# Patient Record
Sex: Male | Born: 1965
Health system: Southern US, Community
[De-identification: ages and names within clinical notes are randomized; demographics above are authoritative.]

## PROBLEM LIST (undated history)

## (undated) DIAGNOSIS — K219 Gastro-esophageal reflux disease without esophagitis: Secondary | ICD-10-CM

## (undated) DIAGNOSIS — I10 Essential (primary) hypertension: Secondary | ICD-10-CM

## (undated) DIAGNOSIS — I7121 Aneurysm of the ascending aorta, without rupture: Secondary | ICD-10-CM

## (undated) DIAGNOSIS — K029 Dental caries, unspecified: Secondary | ICD-10-CM

## (undated) DIAGNOSIS — R011 Cardiac murmur, unspecified: Secondary | ICD-10-CM

## (undated) DIAGNOSIS — E119 Type 2 diabetes mellitus without complications: Secondary | ICD-10-CM

## (undated) DIAGNOSIS — E785 Hyperlipidemia, unspecified: Secondary | ICD-10-CM

## (undated) DIAGNOSIS — R0600 Dyspnea, unspecified: Secondary | ICD-10-CM

## (undated) DIAGNOSIS — I712 Thoracic aortic aneurysm, without rupture: Secondary | ICD-10-CM

## (undated) DIAGNOSIS — I330 Acute and subacute infective endocarditis: Secondary | ICD-10-CM

## (undated) DIAGNOSIS — F1020 Alcohol dependence, uncomplicated: Secondary | ICD-10-CM

## (undated) DIAGNOSIS — I350 Nonrheumatic aortic (valve) stenosis: Secondary | ICD-10-CM

## (undated) HISTORY — DX: Dental caries, unspecified: K02.9

## (undated) HISTORY — DX: Acute and subacute infective endocarditis: I33.0

## (undated) HISTORY — DX: Essential (primary) hypertension: I10

## (undated) HISTORY — DX: Type 2 diabetes mellitus without complications: E11.9

## (undated) HISTORY — PX: CHOLECYSTECTOMY: SHX55

## (undated) HISTORY — DX: Cardiac murmur, unspecified: R01.1

## (undated) HISTORY — DX: Hyperlipidemia, unspecified: E78.5

## (undated) HISTORY — DX: Gastro-esophageal reflux disease without esophagitis: K21.9

## (undated) HISTORY — DX: Alcohol dependence, uncomplicated: F10.20

---

## 2016-08-02 DIAGNOSIS — E119 Type 2 diabetes mellitus without complications: Secondary | ICD-10-CM | POA: Diagnosis not present

## 2016-08-02 DIAGNOSIS — I10 Essential (primary) hypertension: Secondary | ICD-10-CM | POA: Diagnosis not present

## 2016-08-02 DIAGNOSIS — E782 Mixed hyperlipidemia: Secondary | ICD-10-CM | POA: Diagnosis not present

## 2016-12-06 DIAGNOSIS — E119 Type 2 diabetes mellitus without complications: Secondary | ICD-10-CM | POA: Diagnosis not present

## 2016-12-06 DIAGNOSIS — I10 Essential (primary) hypertension: Secondary | ICD-10-CM | POA: Diagnosis not present

## 2016-12-06 DIAGNOSIS — E782 Mixed hyperlipidemia: Secondary | ICD-10-CM | POA: Diagnosis not present

## 2017-06-13 DIAGNOSIS — E782 Mixed hyperlipidemia: Secondary | ICD-10-CM | POA: Diagnosis not present

## 2017-06-13 DIAGNOSIS — I10 Essential (primary) hypertension: Secondary | ICD-10-CM | POA: Diagnosis not present

## 2017-06-13 DIAGNOSIS — E119 Type 2 diabetes mellitus without complications: Secondary | ICD-10-CM | POA: Diagnosis not present

## 2017-12-19 DIAGNOSIS — E1169 Type 2 diabetes mellitus with other specified complication: Secondary | ICD-10-CM | POA: Diagnosis not present

## 2017-12-19 DIAGNOSIS — E782 Mixed hyperlipidemia: Secondary | ICD-10-CM | POA: Diagnosis not present

## 2017-12-19 DIAGNOSIS — I10 Essential (primary) hypertension: Secondary | ICD-10-CM | POA: Diagnosis not present

## 2018-05-29 DIAGNOSIS — I10 Essential (primary) hypertension: Secondary | ICD-10-CM | POA: Diagnosis not present

## 2018-05-29 DIAGNOSIS — I2510 Atherosclerotic heart disease of native coronary artery without angina pectoris: Secondary | ICD-10-CM | POA: Diagnosis not present

## 2018-05-29 DIAGNOSIS — E782 Mixed hyperlipidemia: Secondary | ICD-10-CM | POA: Diagnosis not present

## 2018-06-24 DIAGNOSIS — F1020 Alcohol dependence, uncomplicated: Secondary | ICD-10-CM

## 2018-06-24 DIAGNOSIS — K219 Gastro-esophageal reflux disease without esophagitis: Secondary | ICD-10-CM

## 2018-06-24 DIAGNOSIS — I2510 Atherosclerotic heart disease of native coronary artery without angina pectoris: Secondary | ICD-10-CM

## 2018-06-24 DIAGNOSIS — E119 Type 2 diabetes mellitus without complications: Secondary | ICD-10-CM

## 2018-06-24 DIAGNOSIS — I10 Essential (primary) hypertension: Secondary | ICD-10-CM

## 2018-06-24 DIAGNOSIS — R011 Cardiac murmur, unspecified: Secondary | ICD-10-CM

## 2018-06-24 DIAGNOSIS — E785 Hyperlipidemia, unspecified: Secondary | ICD-10-CM

## 2018-06-24 HISTORY — DX: Alcohol dependence, uncomplicated: F10.20

## 2018-06-24 HISTORY — DX: Hyperlipidemia, unspecified: E78.5

## 2018-06-24 HISTORY — DX: Gastro-esophageal reflux disease without esophagitis: K21.9

## 2018-06-24 HISTORY — DX: Cardiac murmur, unspecified: R01.1

## 2018-06-24 HISTORY — DX: Essential (primary) hypertension: I10

## 2018-06-24 HISTORY — DX: Type 2 diabetes mellitus without complications: E11.9

## 2018-06-24 HISTORY — DX: Atherosclerotic heart disease of native coronary artery without angina pectoris: I25.10

## 2018-06-25 ENCOUNTER — Encounter: Payer: Self-pay | Admitting: Cardiology

## 2018-06-25 ENCOUNTER — Ambulatory Visit: Payer: Commercial Managed Care - PPO | Admitting: Cardiology

## 2018-06-25 VITALS — BP 114/66 | HR 66 | Ht 68.5 in | Wt 218.8 lb

## 2018-06-25 DIAGNOSIS — Q2381 Bicuspid aortic valve: Secondary | ICD-10-CM

## 2018-06-25 DIAGNOSIS — E119 Type 2 diabetes mellitus without complications: Secondary | ICD-10-CM | POA: Diagnosis not present

## 2018-06-25 DIAGNOSIS — I10 Essential (primary) hypertension: Secondary | ICD-10-CM

## 2018-06-25 DIAGNOSIS — Q231 Congenital insufficiency of aortic valve: Secondary | ICD-10-CM

## 2018-06-25 DIAGNOSIS — F1020 Alcohol dependence, uncomplicated: Secondary | ICD-10-CM

## 2018-06-25 DIAGNOSIS — I350 Nonrheumatic aortic (valve) stenosis: Secondary | ICD-10-CM

## 2018-06-25 HISTORY — DX: Nonrheumatic aortic (valve) stenosis: I35.0

## 2018-06-25 HISTORY — DX: Congenital insufficiency of aortic valve: Q23.1

## 2018-06-25 HISTORY — DX: Bicuspid aortic valve: Q23.81

## 2018-06-25 NOTE — Patient Instructions (Addendum)
Medication Instructions:  Your physician recommends that you continue on your current medications as directed. Please refer to the Current Medication list given to you today.  If you need a refill on your cardiac medications before your next appointment, please call your pharmacy.   Lab work: NONE If you have labs (blood work) drawn today and your tests are completely normal, you will receive your results only by: Marland Kitchen MyChart Message (if you have MyChart) OR . A paper copy in the mail If you have any lab test that is abnormal or we need to change your treatment, we will call you to review the results.  Testing/Procedures: You had an EKG Performed today  Your physician has requested that you have an echocardiogram. Echocardiography is a painless test that uses sound waves to create images of your heart. It provides your doctor with information about the size and shape of your heart and how well your heart's chambers and valves are working. This procedure takes approximately one hour. There are no restrictions for this procedure.  Your physician has recommended that you wear an event monitor. Event monitors are medical devices that record the heart's electrical activity. Doctors most often [place] these monitors to diagnose arrhythmias. Arrhythmias are problems with the speed or rhythm of the heartbeat. The monitor is a small, portable device. You can wear one while you do your normal daily activities. This is usually used to diagnose what is causing palpitations/syncope (passing out).You will wear this device for 30 days.   Follow-Up: At [facility], you and your health needs are our priority.  As part of our continuing mission to provide you with exceptional heart care, we have created designated Provider Care Teams.  These Care Teams include your primary Cardiologist (physician) and Advanced Practice Providers (APPs -  Physician Assistants and Nurse Practitioners) who all work together to provide  you with the care you need, when you need it. You will need to follow up with Dr. Kirtland Bouchard in 1 month in the [place] office.  Any Other Special Instructions Will Be Listed Below    Echocardiogram An echocardiogram is a procedure that uses painless sound waves (ultrasound) to produce an image of the heart. Images from an echocardiogram can provide important information about:  Signs of coronary artery disease (CAD).  Aneurysm detection. An aneurysm is a weak or damaged part of an artery wall that bulges out from the normal force of blood pumping through the body.  Heart size and shape. Changes in the size or shape of the heart can be associated with certain conditions, including heart failure, aneurysm, and CAD.  Heart muscle function.  Heart valve function.  Signs of a past heart attack.  Fluid buildup around the heart.  Thickening of the heart muscle.  A tumor or infectious growth around the heart valves. Tell a health care provider about:  Any allergies you have.  All medicines you are taking, including vitamins, herbs, eye drops, creams, and over-the-counter medicines.  Any blood disorders you have.  Any surgeries you have had.  Any medical conditions you have.  Whether you are pregnant or may be pregnant. What are the risks? Generally, this is a safe procedure. However, problems may occur, including:  Allergic reaction to dye (contrast) that may be used during the procedure. What happens before the procedure? No specific preparation is needed. You may eat and drink normally. What happens during the procedure?   An IV tube may be inserted into one of your veins.  You may receive contrast through this tube. A contrast is an injection that improves the quality of the pictures from your heart.  A gel will be applied to your chest.  A wand-like tool (transducer) will be moved over your chest. The gel will help to transmit the sound waves from the transducer.  The  sound waves will harmlessly bounce off of your heart to allow the heart images to be captured in real-time motion. The images will be recorded on a computer. The procedure may vary among health care providers and hospitals. What happens after the procedure?  You may return to your normal, everyday life, including diet, activities, and medicines, unless your health care provider tells you not to do that. Summary  An echocardiogram is a procedure that uses painless sound waves (ultrasound) to produce an image of the heart.  Images from an echocardiogram can provide important information about the size and shape of your heart, heart muscle function, heart valve function, and fluid buildup around your heart.  You do not need to do anything to prepare before this procedure. You may eat and drink normally.  After the echocardiogram is completed, you may return to your normal, everyday life, unless your health care provider tells you not to do that. This information is not intended to replace advice given to you by your health care provider. Make sure you discuss any questions you have with your health care provider. Document Released: 04/05/2000 Document Revised: 05/11/2016 Document Reviewed: 05/11/2016 Elsevier Interactive Patient Education  2019 [employer] Inc.    Ambulatory Cardiac Monitoring An ambulatory cardiac monitor is a small recording device that is used to detect abnormal heart rhythms (arrhythmias). Most monitors are connected by wires to flat, sticky disks (electrodes) that are then attached to your chest. You may need to wear a monitor if you have had symptoms such as:  Fast heartbeats (palpitations).  Dizziness.  Fainting or light-headedness.  Unexplained weakness.  Shortness of breath. There are several types of monitors. Some common monitors include:  Holter monitor. This records your heart rhythm continuously, usually for 24-48 hours.  Event (episodic) monitor. This  monitor has a symptoms button, and when pushed, it will begin recording. You need to activate this monitor to record when you have a heart-related symptom.  Automatic detection monitor. This monitor will begin recording when it detects an abnormal heartbeat. What are the risks? Generally, these devices are safe to use. However, it is possible that the skin under the electrodes will become irritated. How to prepare for monitoring Your health care provider will prepare your chest for the electrode placement and show you how to use the monitor.  Do not apply lotions to your chest before monitoring.  Follow directions on how to care for the monitor, and how to return the monitor when the testing period is complete. How to use your cardiac monitor  Follow directions about how long to wear the monitor, and if you can take the monitor off in order to shower or bathe. ? Do not let the monitor get wet. ? Do not bathe, swim, or use a hot tub while wearing the monitor.  Keep your skin clean. Do not put body lotion or moisturizer on your chest.  Change the electrodes as told by your health care provider, or any time they stop sticking to your skin. You may need to use medical tape to keep them on.  Try to put the electrodes in slightly different places on your chest to  help prevent skin irritation. Follow directions from your health care provider about where to place the electrodes.  Make sure the monitor is safely clipped to your clothing or in a location close to your body as recommended by your health care provider.  If your monitor has a symptoms button, press the button to mark an event as soon as you feel a heart-related symptom, such as: ? Dizziness. ? Weakness. ? Light-headedness. ? Palpitations. ? Thumping or pounding in your chest. ? Shortness of breath. ? Unexplained weakness.  Keep a diary of your activities, such as walking, doing chores, and taking medicine. It is very important  to note what you were doing when you pushed the button to record your symptoms. This will help your health care provider determine what might be contributing to your symptoms.  Send the recorded information as recommended by your health care provider. It may take some time for your health care provider to process the results.  Change the batteries as told by your health care provider.  Keep electronic devices away from your monitor. These include: ? Tablets. ? MP3 players. ? Cell phones.  While wearing your monitor you should avoid: ? Electric blankets. ? Firefighterlectric razors. ? Electric toothbrushes. ? Microwave ovens. ? Magnets. ? Metal detectors. Get help right away if:  You have chest pain.  You have shortness of breath or extreme difficulty breathing.  You develop a very fast heartbeat that does not get better.  You develop dizziness that does not go away.  You faint or constantly feel like you are about to faint. Summary  An ambulatory cardiac monitor is a small recording device that is used to detect abnormal heart rhythms (arrhythmias).  Make sure you understand how to send the information from the monitor to your health care provider.  It is important to press the button on the monitor when you have any heart-related symptoms.  Keep a diary of your activities, such as walking, doing chores, and taking medicine. It is very important to note what you were doing when you pushed the button to record your symptoms. This will help your health care provider learn what might be causing your symptoms. This information is not intended to replace advice given to you by your health care provider. Make sure you discuss any questions you have with your health care provider. Document Released: 01/16/2008 Document Revised: 01/22/2017 Document Reviewed: 03/23/2016 Elsevier Interactive Patient Education  2019 [employer]

## 2018-06-25 NOTE — Progress Notes (Addendum)
Cardiology Consultation:    Date:  06/25/2018   ID:  Ronnie Ruiz, DOB 1966-03-10, MRN [id number]  PCP:  Ronnie Miyamoto, MD  Cardiologist:  Ronnie Balsam, MD   Referring MD: Ronnie Ruiz,*   Chief Complaint  Patient presents with  . Atherosclerosis  I get the problem with my heart valve  History of Present Illness:    Ronnie Ruiz is a 53 y.o. male who is being seen today for the evaluation of aortic stenosis at the request of Ronnie Ruiz,*.  Apparently have seen him many years ago he was diagnosed with bicuspid aortic valve from the initial fall on the regular basis that disappeared from follow-up he went back to his primary care physician for regular follow-up and then he was referred back to [place].  He described few issues #1 he does have some exertional shortness of breath he blames it on overall the fact that he works a lot and his body wears out.  Another issue he complained of having some chest pain which is typically not related to exercise located on the left side of his chest lasting for a few minutes pressure-like sensation.  No dizziness no passing out.  He is aware of the fact that he had valve problem and he is doing well she told me about the doing of today's visit.  I have no old notes on him.  He works as a Publishing rights manager.  That required some walking when he does quite well.  That does not require physical work anymore.  No passing out. He does have high blood pressure.  At some appears to be well managed also diabetes.  He does not smoke never did but dip.  He drinks alcohol on the regular basis every day single day drinks about 6 beers.  Past Medical History:  Diagnosis Date  . Alcoholism (HCC) 06/24/2018  . Diabetes mellitus type 2, controlled (HCC) 06/24/2018  . Essential hypertension 06/24/2018  . GERD (gastroesophageal reflux disease) 06/24/2018  . Heart murmur 06/24/2018  . Hyperlipidemia 06/24/2018      Current  Medications: Current Meds  Medication Sig  . aspirin EC 81 MG tablet Take 81 mg by mouth daily.  Marland Kitchen esomeprazole (NEXIUM) 20 MG packet Take 20 mg by mouth daily before breakfast.  . ibuprofen (ADVIL,MOTRIN) 800 MG tablet Take 800 mg by mouth every 8 (eight) hours as needed.  Marland Kitchen lisinopril (PRINIVIL,ZESTRIL) 10 MG tablet Take 10 mg by mouth daily.  . metFORMIN (GLUCOPHAGE) 500 MG tablet Take 500 mg by mouth 2 (two) times daily with a meal.  . metoprolol succinate (TOPROL-XL) 50 MG 24 hr tablet Take 50 mg by mouth daily. Take with or immediately following a meal.  . nitroGLYCERIN (NITROSTAT) 0.4 MG SL tablet Place 0.4 mg under the tongue every 5 (five) minutes as needed for chest pain.  . Omega-3 Fatty Acids (FISH OIL) 1000 MG CAPS Take 1 capsule by mouth 2 (two) times daily.  . simvastatin (ZOCOR) 40 MG tablet Take 40 mg by mouth daily.     Allergies:   Patient has no known allergies.   Social History   Socioeconomic History  . Marital status: Unknown    Spouse name: Not on file  . Number of children: Not on file  . Years of education: Not on file  . Highest education level: Not on file  Occupational History  . Not on file  Social Needs  . Financial resource strain: Not on file  .  Food insecurity:    Worry: Not on file    Inability: Not on file  . Transportation needs:    Medical: Not on file    Non-medical: Not on file  Tobacco Use  . Smoking status: Never Smoker  . Smokeless tobacco: Current User    Types: Snuff  Substance and Sexual Activity  . Alcohol use: Yes  . Drug use: Never  . Sexual activity: Not on file  Lifestyle  . Physical activity:    Days per week: Not on file    Minutes per session: Not on file  . Stress: Not on file  Relationships  . Social connections:    Talks on phone: Not on file    Gets together: Not on file    Attends religious service: Not on file    Active member of club or organization: Not on file    Attends meetings of clubs or  organizations: Not on file    Relationship status: Not on file  Other Topics Concern  . Not on file  Social History Narrative  . Not on file     Family History: The patient's family history includes Alcoholism in his father; Anxiety disorder in his father; CAD in his father; Hyperlipidemia in his father; Lung cancer in his father and mother; Pulmonary disease in his father and mother; Renal cancer in his father. ROS:   Please see the history of present illness.    All 14 point review of systems negative except as described per history of present illness.  EKGs/Labs/Other Studies Reviewed:    The following studies were reviewed today:     Recent Labs: No results found for requested labs within last 8760 hours.  Recent Lipid Panel No results found for: CHOL, TRIG, HDL, CHOLHDL, VLDL, LDLCALC, LDLDIRECT  Physical Exam:    VS:  BP 114/66   Pulse 66   Ht 5' 8.5" (1.74 m)   Wt 218 lb 12.8 oz (99.2 kg)   SpO2 96%   BMI 32.78 kg/m     Wt Readings from Last 3 Encounters:  06/25/18 218 lb 12.8 oz (99.2 kg)     GEN:  Well nourished, well developed in no acute distress HEENT: Normal NECK: No JVD; No carotid bruits LYMPHATICS: No lymphadenopathy CARDIAC: RRR, late peaking ejection murmur grade 3/6 best heard at the rt upper portion of the sternum, no rubs, no gallops RESPIRATORY:  Clear to auscultation without rales, wheezing or rhonchi  ABDOMEN: Soft, non-tender, non-distended MUSCULOSKELETAL:  No edema; No deformity  SKIN: Warm and dry NEUROLOGIC:  Alert and oriented x 3 PSYCHIATRIC:  Normal affect   ASSESSMENT:    1. Essential hypertension   2. Nonrheumatic aortic valve stenosis   3. Bicuspid aortic valve   4. Controlled type 2 diabetes mellitus without complication, without long-term current use of insulin (HCC)   5. Alcoholism (HCC)    PLAN:    In order of problems listed above:  1. Nonrheumatic aortic valve stenosis on the physical examination get some  indicators for severe aortic stenosis.  I will ask him to have echocardiogram quickly to establish if this is truly severe aortic stenosis.  It looks like etiology of this problem is bicuspid aortic valve which is congenital.  I told him until we find out how significant the valve is not to push himself heart.  On top of that he does have some symptoms that are concerning it is not exactly clear from his description if this is related  to his aortic stenosis but the way to clarify this will be to do echocardiogram which I will make arrangements to do. 2. Bicuspid aortic valve: Plan as outlined above. 3. Essential hypertension blood pressure appears to be well controlled we will continue present management. 4. Diabetes mellitus he said he recently started to lose set up with his diet and he is actually hemoglobin A1c went higher he remember numbers 7.7 and he is trying to go back on his regular diet to improve this. 5. EtOH abuse obviously a problem.  I told him that he did cut down his alcohol he said he will work on that. 6. Palpitations he described to have fluttering that happen about maybe once or twice a month lasting for a few minutes make him dizzy but not to the point of passing out.  I will ask him to have monitor.   Medication Adjustments/Labs and Tests Ordered: Current medicines are reviewed at length with the patient today.  Concerns regarding medicines are outlined above.  No orders of the defined types were placed in this encounter.  No orders of the defined types were placed in this encounter.   Signed, Georgeanna Lea, MD, [facility]. 06/25/2018 3:25 PM    [facility]

## 2018-06-26 ENCOUNTER — Other Ambulatory Visit: Payer: Self-pay | Admitting: Cardiology

## 2018-06-26 DIAGNOSIS — I10 Essential (primary) hypertension: Secondary | ICD-10-CM

## 2018-06-26 DIAGNOSIS — R002 Palpitations: Secondary | ICD-10-CM

## 2018-06-26 DIAGNOSIS — Q231 Congenital insufficiency of aortic valve: Secondary | ICD-10-CM

## 2018-06-26 DIAGNOSIS — I350 Nonrheumatic aortic (valve) stenosis: Secondary | ICD-10-CM

## 2018-07-01 ENCOUNTER — Ambulatory Visit (INDEPENDENT_AMBULATORY_CARE_PROVIDER_SITE_OTHER): Payer: Commercial Managed Care - PPO

## 2018-07-01 ENCOUNTER — Other Ambulatory Visit: Payer: Self-pay

## 2018-07-01 ENCOUNTER — Telehealth: Payer: Self-pay | Admitting: Emergency Medicine

## 2018-07-01 DIAGNOSIS — I10 Essential (primary) hypertension: Secondary | ICD-10-CM

## 2018-07-01 DIAGNOSIS — Q231 Congenital insufficiency of aortic valve: Secondary | ICD-10-CM

## 2018-07-01 DIAGNOSIS — I350 Nonrheumatic aortic (valve) stenosis: Secondary | ICD-10-CM

## 2018-07-01 NOTE — Progress Notes (Signed)
Complete echocardiogram has been performed.  Jimmy Helia Haese RDCS, RVT 

## 2018-07-01 NOTE — Telephone Encounter (Signed)
Informed patient of echocardiogram results and informed him that Dr. Bing Matter was concerned and wanted to see him tomorrow. I informed the patient that we are in the [place] office this week he states he will call back because he couldn't schedule an appointment at this time. I asked him to call back today. He verbally understood.

## 2018-07-02 ENCOUNTER — Ambulatory Visit (HOSPITAL_BASED_OUTPATIENT_CLINIC_OR_DEPARTMENT_OTHER)
Admission: RE | Admit: 2018-07-02 | Discharge: 2018-07-02 | Disposition: A | Discharge status: Home / Self Care | Payer: Commercial Managed Care - PPO | Source: Ambulatory Visit | Attending: Cardiology | Admitting: Cardiology

## 2018-07-02 ENCOUNTER — Encounter: Payer: Self-pay | Admitting: Cardiology

## 2018-07-02 ENCOUNTER — Ambulatory Visit (INDEPENDENT_AMBULATORY_CARE_PROVIDER_SITE_OTHER): Payer: Commercial Managed Care - PPO | Admitting: Cardiology

## 2018-07-02 ENCOUNTER — Other Ambulatory Visit (HOSPITAL_BASED_OUTPATIENT_CLINIC_OR_DEPARTMENT_OTHER): Payer: Commercial Managed Care - PPO

## 2018-07-02 VITALS — BP 134/70 | HR 72 | Ht 68.5 in | Wt 217.1 lb

## 2018-07-02 DIAGNOSIS — Z01818 Encounter for other preprocedural examination: Secondary | ICD-10-CM | POA: Diagnosis not present

## 2018-07-02 DIAGNOSIS — E119 Type 2 diabetes mellitus without complications: Secondary | ICD-10-CM | POA: Diagnosis not present

## 2018-07-02 DIAGNOSIS — Q231 Congenital insufficiency of aortic valve: Secondary | ICD-10-CM

## 2018-07-02 DIAGNOSIS — I10 Essential (primary) hypertension: Secondary | ICD-10-CM

## 2018-07-02 DIAGNOSIS — I350 Nonrheumatic aortic (valve) stenosis: Secondary | ICD-10-CM

## 2018-07-02 DIAGNOSIS — E782 Mixed hyperlipidemia: Secondary | ICD-10-CM

## 2018-07-02 DIAGNOSIS — I712 Thoracic aortic aneurysm, without rupture: Secondary | ICD-10-CM

## 2018-07-02 DIAGNOSIS — I7121 Aneurysm of the ascending aorta, without rupture: Secondary | ICD-10-CM

## 2018-07-02 DIAGNOSIS — I719 Aortic aneurysm of unspecified site, without rupture: Secondary | ICD-10-CM | POA: Diagnosis not present

## 2018-07-02 HISTORY — DX: Thoracic aortic aneurysm, without rupture: I71.2

## 2018-07-02 HISTORY — DX: Aneurysm of the ascending aorta, without rupture: I71.21

## 2018-07-02 MED ORDER — LISINOPRIL 10 MG PO TABS: 5.0000 mg | ORAL_TABLET | Freq: Every day | ORAL | 0 refills | Status: DC

## 2018-07-02 NOTE — H&P (View-Only) (Signed)
Cardiology Office Note:    Date:  07/02/2018   ID:  Ronnie Ruiz, DOB 07-09-1965, MRN [id number]  PCP:  Ronnie Miyamoto, MD  Cardiologist:  Ronnie Balsam, MD    Referring MD: Ronnie Ruiz,*   Chief Complaint  Patient presents with   Follow-up  I am here to talk about my test  History of Present Illness:    Ronnie Ruiz is a 53 y.o. male whom I followed a few years ago because of bicuspid aortic valve and few years ago he is aortic valve was assessed as moderate then he disappeared from follow-up for few years and then about 3 weeks ago showed up in my office he did have characteristic of significant aortic stenosis based on auscultation and echocardiogram was done echocardiogram confirmed presence of critical aortic stenosis with mean grad of 64 area well below 1 cm.  He is downgrading his symptoms however his wife was in the room with him today tells me that he get tightness in the chest when he climbs stairs also shortness of breath to the point that he has to stop there is no dizziness no passing out.  I brought him today to the office to talk about options in this situation.  There is no doubt in my mind that this valve is critical and aggressive management is needed.  I talked to him about the prognosis of critical aortic stenosis which is very poor without intervention I talked also about options for fixing the valve including open heart surgery with either biological a mechanical valve because of his history of drinking probably biological valve will be most visible because of risk of anticoagulation.  We also talked briefly about TAVR however also mention to him that this procedure is not indicated for somebody with bicuspid aortic valve.  I also told him that the most important test that need to be done in order to determine what procedure will be the best for him is cardiac catheterization we discussed cardiac catheterization including all risk benefits as well as  alternatives, he agreed to proceed that way.  I also told him that he will require CT of his chest to look into the anterior aorta what I can see with my echocardiogram is enlargement of the proximal portion of the ascending aorta to 41 mm.  This is probably a part of aortopathy that is present in patient with bicuspid aortic valve.  I also informed him about the fact that he will required carotid ultrasounds to make sure he does not have any significant cardiac arterial stenosis.  I can see bruit on the left side I suspect this is transmitted murmur from aortic stenosis however we need to make sure there is no significant stenosis based on this test will decide which will be the best way to approach his problems.  Both him and his wife asked very intelligent questions and all were answered.  Past Medical History:  Diagnosis Date   Alcoholism (HCC) 06/24/2018   Diabetes mellitus type 2, controlled (HCC) 06/24/2018   Essential hypertension 06/24/2018   GERD (gastroesophageal reflux disease) 06/24/2018   Heart murmur 06/24/2018   Hyperlipidemia 06/24/2018      Current Medications: Current Meds  Medication Sig   aspirin EC 81 MG tablet Take 81 mg by mouth daily.   esomeprazole (NEXIUM) 20 MG packet Take 20 mg by mouth daily before breakfast.   ibuprofen (ADVIL,MOTRIN) 800 MG tablet Take 800 mg by mouth every 8 (eight) hours as needed.  lisinopril (PRINIVIL,ZESTRIL) 10 MG tablet Take 10 mg by mouth daily.  °• metFORMIN (GLUCOPHAGE) 500 MG tablet Take 500 mg by mouth 2 (two) times daily with a meal.  °• metoprolol succinate (TOPROL-XL) 50 MG 24 hr tablet Take 50 mg by mouth daily. Take with or immediately following a meal.  °• nitroGLYCERIN (NITROSTAT) 0.4 MG SL tablet Place 0.4 mg under the tongue every 5 (five) minutes as needed for chest pain.  °• Omega-3 Fatty Acids (FISH OIL) 1000 MG CAPS Take 1 capsule by mouth 2 (two) times daily.  °• simvastatin (ZOCOR) 40 MG tablet Take 40 mg by mouth daily.    °  ° °Allergies:   Patient has no known allergies.  ° °Social History  ° °Socioeconomic History  °• Marital status: Unknown  °  Spouse name: Not on file  °• Number of children: Not on file  °• Years of education: Not on file  °• Highest education level: Not on file  °Occupational History  °• Not on file  °Social Needs  °• Financial resource strain: Not on file  °• Food insecurity:  °  Worry: Not on file  °  Inability: Not on file  °• Transportation needs:  °  Medical: Not on file  °  Non-medical: Not on file  °Tobacco Use  °• Smoking status: Never Smoker  °• Smokeless tobacco: Current User  °  Types: Snuff  °Substance and Sexual Activity  °• Alcohol use: Yes  °• Drug use: Never  °• Sexual activity: Not on file  °Lifestyle  °• Physical activity:  °  Days per week: Not on file  °  Minutes per session: Not on file  °• Stress: Not on file  °Relationships  °• Social connections:  °  Talks on phone: Not on file  °  Gets together: Not on file  °  Attends religious service: Not on file  °  Active member of club or organization: Not on file  °  Attends meetings of clubs or organizations: Not on file  °  Relationship status: Not on file  °Other Topics Concern  °• Not on file  °Social History Narrative  °• Not on file  °  ° °Family History: °The patient's family history includes Alcoholism in his father; Anxiety disorder in his father; CAD in his father; Hyperlipidemia in his father; Lung cancer in his father and mother; Pulmonary disease in his father and mother; Renal cancer in his father. °ROS:   °Please see the history of present illness.    °All 14 point review of systems negative except as described per history of present illness ° °EKGs/Labs/Other Studies Reviewed:   ° ° ° °Recent Labs: °No results found for requested labs within last 8760 hours.  °Recent Lipid Panel °No results found for: CHOL, TRIG, HDL, CHOLHDL, VLDL, LDLCALC, LDLDIRECT ° °Physical Exam:   ° °VS:  BP 134/70    Pulse 72    Ht 5' 8.5" (1.74 m)    Wt  217 lb 1.9 oz (98.5 kg)    SpO2 98%    BMI 32.53 kg/m²    ° °Wt Readings from Last 3 Encounters:  °07/02/18 217 lb 1.9 oz (98.5 kg)  °06/25/18 218 lb 12.8 oz (99.2 kg)  °  ° °GEN:  Well nourished, well developed in no acute distress °HEENT: Normal °NECK: No JVD; left carotid bruit °LYMPHATICS: No lymphadenopathy °CARDIAC: RRR, late peaking systolic ejection murmur best heard at the right upper portion of the sternum   grade 3/6 to 4/6.  S3 still present, no rubs, no gallops RESPIRATORY:  Clear to auscultation without rales, wheezing or rhonchi  ABDOMEN: Soft, non-tender, non-distended MUSCULOSKELETAL:  No edema; No deformity  SKIN: Warm and dry LOWER EXTREMITIES: no swelling NEUROLOGIC:  Alert and oriented x 3 PSYCHIATRIC:  Normal affect   ASSESSMENT:    1. Mixed hyperlipidemia   2. Controlled type 2 diabetes mellitus without complication, without long-term current use of insulin (HCC)   3. Essential hypertension   4. Critical aortic valve stenosis mean gradient 64 mmHg,   5. Bicuspid aortic valve   6. Ascending aortic aneurysm (HCC) 41 mm as measured by echo    PLAN:    In order of problems listed above:  1. Critical aortic stenosis.  Plan as outlined above we will proceed with cardiac catheterization and based on that decision will be made in terms of intervention this will be most visible for his condition. 2. Still hypertension blood pressure appears to be well controlled we will continue present management. 3. Ascending arctic aneurysm CT of his chest will be done anticipate this to be done while in the hospital. 4. Left carotid bruit cardiac ultrasounds will be scheduled. 5. I told him not to exert himself and not to do any exercises until we fix his valve.  I also asked him to reduce dose of lisinopril to only 5 mg daily.  I also told him to stay well-hydrated.  We will try to proceed rather quickly because of severity of the problem I anticipate cardiac catheterization at the  beginning of next week   Medication Adjustments/Labs and Tests Ordered: Current medicines are reviewed at length with the patient today.  Concerns regarding medicines are outlined above.  No orders of the defined types were placed in this encounter.  Medication changes: No orders of the defined types were placed in this encounter.   Signed, Georgeanna Lea, MD, [facility] 07/02/2018 1:48 PM    [facility]

## 2018-07-02 NOTE — Progress Notes (Signed)
Cardiology Office Note:    Date:  07/02/2018   ID:  Ronnie Ruiz, DOB 07-09-1965, MRN [id number]  PCP:  [name], MD  Cardiologist:  Gypsy Balsam, MD    Referring MD: [name],*   Chief Complaint  Patient presents with   Follow-up  I am here to talk about my test  History of Present Illness:    Ronnie Ruiz is a 53 y.o. male whom I followed a few years ago because of bicuspid aortic valve and few years ago he is aortic valve was assessed as moderate then he disappeared from follow-up for few years and then about 3 weeks ago showed up in my office he did have characteristic of significant aortic stenosis based on auscultation and echocardiogram was done echocardiogram confirmed presence of critical aortic stenosis with mean grad of 64 area well below 1 cm.  He is downgrading his symptoms however his wife was in the room with him today tells me that he get tightness in the chest when he climbs stairs also shortness of breath to the point that he has to stop there is no dizziness no passing out.  I brought him today to the office to talk about options in this situation.  There is no doubt in my mind that this valve is critical and aggressive management is needed.  I talked to him about the prognosis of critical aortic stenosis which is very poor without intervention I talked also about options for fixing the valve including open heart surgery with either biological a mechanical valve because of his history of drinking probably biological valve will be most visible because of risk of anticoagulation.  We also talked briefly about TAVR however also mention to him that this procedure is not indicated for somebody with bicuspid aortic valve.  I also told him that the most important test that need to be done in order to determine what procedure will be the best for him is cardiac catheterization we discussed cardiac catheterization including all risk benefits as well as  alternatives, he agreed to proceed that way.  I also told him that he will require CT of his chest to look into the anterior aorta what I can see with my echocardiogram is enlargement of the proximal portion of the ascending aorta to 41 mm.  This is probably a part of aortopathy that is present in patient with bicuspid aortic valve.  I also informed him about the fact that he will required carotid ultrasounds to make sure he does not have any significant cardiac arterial stenosis.  I can see bruit on the left side I suspect this is transmitted murmur from aortic stenosis however we need to make sure there is no significant stenosis based on this test will decide which will be the best way to approach his problems.  Both him and his wife asked very intelligent questions and all were answered.  Past Medical History:  Diagnosis Date   Alcoholism (HCC) 06/24/2018   Diabetes mellitus type 2, controlled (HCC) 06/24/2018   Essential hypertension 06/24/2018   GERD (gastroesophageal reflux disease) 06/24/2018   Heart murmur 06/24/2018   Hyperlipidemia 06/24/2018      Current Medications: Current Meds  Medication Sig   aspirin EC 81 MG tablet Take 81 mg by mouth daily.   esomeprazole (NEXIUM) 20 MG packet Take 20 mg by mouth daily before breakfast.   ibuprofen (ADVIL,MOTRIN) 800 MG tablet Take 800 mg by mouth every 8 (eight) hours as needed.  lisinopril (PRINIVIL,ZESTRIL) 10 MG tablet Take 10 mg by mouth daily.   metFORMIN (GLUCOPHAGE) 500 MG tablet Take 500 mg by mouth 2 (two) times daily with a meal.   metoprolol succinate (TOPROL-XL) 50 MG 24 hr tablet Take 50 mg by mouth daily. Take with or immediately following a meal.   nitroGLYCERIN (NITROSTAT) 0.4 MG SL tablet Place 0.4 mg under the tongue every 5 (five) minutes as needed for chest pain.   Omega-3 Fatty Acids (FISH OIL) 1000 MG CAPS Take 1 capsule by mouth 2 (two) times daily.   simvastatin (ZOCOR) 40 MG tablet Take 40 mg by mouth daily.       Allergies:   Patient has no known allergies.   Social History   Socioeconomic History   Marital status: Unknown    Spouse name: Not on file   Number of children: Not on file   Years of education: Not on file   Highest education level: Not on file  Occupational History   Not on file  Social Needs   Financial resource strain: Not on file   Food insecurity:    Worry: Not on file    Inability: Not on file   Transportation needs:    Medical: Not on file    Non-medical: Not on file  Tobacco Use   Smoking status: Never Smoker   Smokeless tobacco: Current User    Types: Snuff  Substance and Sexual Activity   Alcohol use: Yes   Drug use: Never   Sexual activity: Not on file  Lifestyle   Physical activity:    Days per week: Not on file    Minutes per session: Not on file   Stress: Not on file  Relationships   Social connections:    Talks on phone: Not on file    Gets together: Not on file    Attends religious service: Not on file    Active member of club or organization: Not on file    Attends meetings of clubs or organizations: Not on file    Relationship status: Not on file  Other Topics Concern   Not on file  Social History Narrative   Not on file     Family History: The patient's family history includes Alcoholism in his father; Anxiety disorder in his father; CAD in his father; Hyperlipidemia in his father; Lung cancer in his father and mother; Pulmonary disease in his father and mother; Renal cancer in his father. ROS:   Please see the history of present illness.    All 14 point review of systems negative except as described per history of present illness  EKGs/Labs/Other Studies Reviewed:      Recent Labs: No results found for requested labs within last 8760 hours.  Recent Lipid Panel No results found for: CHOL, TRIG, HDL, CHOLHDL, VLDL, LDLCALC, LDLDIRECT  Physical Exam:    VS:  BP 134/70    Pulse 72    Ht 5' 8.5" (1.74 m)    Wt  217 lb 1.9 oz (98.5 kg)    SpO2 98%    BMI 32.53 kg/m     Wt Readings from Last 3 Encounters:  07/02/18 217 lb 1.9 oz (98.5 kg)  06/25/18 218 lb 12.8 oz (99.2 kg)     GEN:  Well nourished, well developed in no acute distress HEENT: Normal NECK: No JVD; left carotid bruit LYMPHATICS: No lymphadenopathy CARDIAC: RRR, late peaking systolic ejection murmur best heard at the right upper portion of the sternum  grade 3/6 to 4/6.  S3 still present, no rubs, no gallops RESPIRATORY:  Clear to auscultation without rales, wheezing or rhonchi  ABDOMEN: Soft, non-tender, non-distended MUSCULOSKELETAL:  No edema; No deformity  SKIN: Warm and dry LOWER EXTREMITIES: no swelling NEUROLOGIC:  Alert and oriented x 3 PSYCHIATRIC:  Normal affect   ASSESSMENT:    1. Mixed hyperlipidemia   2. Controlled type 2 diabetes mellitus without complication, without long-term current use of insulin (HCC)   3. Essential hypertension   4. Critical aortic valve stenosis mean gradient 64 mmHg,   5. Bicuspid aortic valve   6. Ascending aortic aneurysm (HCC) 41 mm as measured by echo    PLAN:    In order of problems listed above:  1. Critical aortic stenosis.  Plan as outlined above we will proceed with cardiac catheterization and based on that decision will be made in terms of intervention this will be most visible for his condition. 2. Still hypertension blood pressure appears to be well controlled we will continue present management. 3. Ascending arctic aneurysm CT of his chest will be done anticipate this to be done while in the hospital. 4. Left carotid bruit cardiac ultrasounds will be scheduled. 5. I told him not to exert himself and not to do any exercises until we fix his valve.  I also asked him to reduce dose of lisinopril to only 5 mg daily.  I also told him to stay well-hydrated.  We will try to proceed rather quickly because of severity of the problem I anticipate cardiac catheterization at the  beginning of next week   Medication Adjustments/Labs and Tests Ordered: Current medicines are reviewed at length with the patient today.  Concerns regarding medicines are outlined above.  No orders of the defined types were placed in this encounter.  Medication changes: No orders of the defined types were placed in this encounter.   Signed, Georgeanna Lea, MD, [facility] 07/02/2018 1:48 PM    [facility]

## 2018-07-02 NOTE — Patient Instructions (Signed)
Medication Instructions:  Your physician has recommended you make the following change in your medication:   Decrease: Lisinopril to 5 mg daily   If you need a refill on your cardiac medications before your next appointment, please call your pharmacy.   Lab work: Your physician recommends that you return for lab work today: bmp, cbc   If you have labs (blood work) drawn today and your tests are completely normal, you will receive your results only by: Marland Kitchen. MyChart Message (if you have MyChart) OR . A paper copy in the mail If you have any lab test that is abnormal or we need to change your treatment, we will call you to review the results.  Testing/Procedures: A chest x-ray takes a picture of the organs and structures inside the chest, including the heart, lungs, and blood vessels. This test can show several things, including, whether the heart is enlarges; whether fluid is building up in the lungs; and whether pacemaker / defibrillator leads are still in place.     [facility] [place] [address], [address] [place] [address] Dept: [phone] Loc: [phone]  Ronnie Ruiz  07/02/2018  You are scheduled for a Cardiac Catheterization on Tuesday, March 17 with Dr. Nicki Guadalajarahomas Kelly.  1. Please arrive at the [facility] at [facility]: [address] [place], [place] [address] at 10:00 AM (This time is two hours before your procedure to ensure your preparation). Free valet parking service is available.   Special note: Every effort is made to have your procedure done on time. Please understand that emergencies sometimes delay scheduled procedures.  2. Diet: Do not eat solid foods after midnight.  The patient may have clear liquids until 5am upon the day of the procedure.  3. Labs: You will have labs drawn  Today.   4. Medication instructions in preparation for your procedure:    Contrast Allergy: No   Do not take Diabetes Med Glucophage (Metformin) on the day of the procedure and HOLD 48 HOURS AFTER THE PROCEDURE.  On the morning of your procedure, take your Aspirin and any morning medicines NOT listed above.  You may use sips of water.  5. Plan for one night stay--bring personal belongings. 6. Bring a current list of your medications and current insurance cards. 7. You MUST have a responsible person to drive you home. 8. Someone MUST be with you the first 24 hours after you arrive home or your discharge will be delayed. 9. Please wear clothes that are easy to get on and off and wear slip-on shoes.  Thank you for allowing us to care for you!   -- [facility]   Follow-Up: At [facility], you and your health needs are our priority.  As part of our continuing mission to provide you with exceptional heart care, we have created designated Provider Care Teams.  These Care Teams include your primary Cardiologist (physician) and Advanced Practice Providers (APPs -  Physician Assistants and Nurse Practitioners) who all work together to provide you with the care you need, when you need it. You will need a follow up appointment in 1 months.  Please call our office 2 months in advance to schedule this appointment.  You may see No primary care provider on file. or another member of our [facility] Provider Team in [place]: Norman HerrlichBrian Munley, MD . Belva Cromeajan Revankar, MD  Any Other Special  Instructions Will Be Listed Below (If Applicable).   Coronary Angiogram With Stent Coronary angiogram with stent placement is a procedure to widen or open a narrow blood vessel of the heart (coronary artery). Arteries may become blocked by cholesterol buildup (plaques) in the lining of the wall. When a coronary artery becomes partially blocked, blood flow to that area decreases. This may lead to chest pain or a heart attack (myocardial infarction). A stent is  a small piece of metal that looks like mesh or a spring. Stent placement may be done as treatment for a heart attack or right after a coronary angiogram in which a blocked artery is found. Let your health care provider know about:  Any allergies you have.  All medicines you are taking, including vitamins, herbs, eye drops, creams, and over-the-counter medicines.  Any problems you or family members have had with anesthetic medicines.  Any blood disorders you have.  Any surgeries you have had.  Any medical conditions you have.  Whether you are pregnant or may be pregnant. What are the risks? Generally, this is a safe procedure. However, problems may occur, including:  Damage to the heart or its blood vessels.  A return of blockage.  Bleeding, infection, or bruising at the insertion site.  A collection of blood under the skin (hematoma) at the insertion site.  A blood clot in another part of the body.  Kidney injury.  Allergic reaction to the dye or contrast that is used.  Bleeding into the abdomen (retroperitoneal bleeding). What happens before the procedure? Staying hydrated Follow instructions from your health care provider about hydration, which may include:  Up to 2 hours before the procedure - you may continue to drink clear liquids, such as water, clear fruit juice, black coffee, and plain tea.  Eating and drinking restrictions Follow instructions from your health care provider about eating and drinking, which may include:  8 hours before the procedure - stop eating heavy meals or foods such as meat, fried foods, or fatty foods.  6 hours before the procedure - stop eating light meals or foods, such as toast or cereal.  2 hours before the procedure - stop drinking clear liquids. Ask your health care provider about:  Changing or stopping your regular medicines. This is especially important if you are taking diabetes medicines or blood thinners.  Taking medicines  such as ibuprofen. These medicines can thin your blood. Do not take these medicines before your procedure if your health care provider instructs you not to. Generally, aspirin is recommended before a procedure of passing a small, thin tube (catheter) through a blood vessel and into the heart (cardiac catheterization). What happens during the procedure?   An IV tube will be inserted into one of your veins.  You will be given one or more of the following: ? A medicine to help you relax (sedative). ? A medicine to numb the area where the catheter will be inserted into an artery (local anesthetic).  To reduce your risk of infection: ? Your health care team will wash or sanitize their hands. ? Your skin will be washed with soap. ? Hair may be removed from the area where the catheter will be inserted.  Using a guide wire, the catheter will be inserted into an artery. The location may be in your groin, in your wrist, or in the fold of your arm (near your elbow).  A type of X-ray (fluoroscopy) will be used to help guide the catheter to the  opening of the arteries in the heart.  A dye will be injected into the catheter, and X-rays will be taken. The dye will help to show where any narrowing or blockages are located in the arteries.  A tiny wire will be guided to the blocked spot, and a balloon will be inflated to make the artery wider.  The stent will be expanded and will crush the plaques into the wall of the vessel. The stent will hold the area open and improve the blood flow. Most stents have a drug coating to reduce the risk of the stent narrowing over time.  The artery may be made wider using a drill, laser, or other tools to remove plaques.  When the blood flow is better, the catheter will be removed. The lining of the artery will grow over the stent, which stays where it was placed. This procedure may vary among health care providers and hospitals. What happens after the procedure?  If  the procedure is done through the leg, you will be kept in bed lying flat for about 6 hours. You will be instructed to not bend and not cross your legs.  The insertion site will be checked frequently.  The pulse in your foot or wrist will be checked frequently.  You may have additional blood tests, X-rays, and a test that records the electrical activity of your heart (electrocardiogram, or ECG). This information is not intended to replace advice given to you by your health care provider. Make sure you discuss any questions you have with your health care provider. Document Released: 10/13/2002 Document Revised: 07/18/2017 Document Reviewed: 11/12/2015 Elsevier Interactive Patient Education  2019 [employer].

## 2018-07-03 LAB — BASIC METABOLIC PANEL
BUN/Creatinine Ratio: 12 (ref 9–20)
BUN: 10 mg/dL (ref 6–24)
CALCIUM: 9.7 mg/dL (ref 8.7–10.2)
CO2: 24 mmol/L (ref 20–29)
Chloride: 99 mmol/L (ref 96–106)
Creatinine, Ser: 0.83 mg/dL (ref 0.76–1.27)
GFR calc Af Amer: 115 mL/min/{1.73_m2} (ref >59)
GFR calc non Af Amer: 100 mL/min/{1.73_m2} (ref >59)
Glucose: 147 mg/dL — ABNORMAL HIGH (ref 65–99)
Potassium: 4.3 mmol/L (ref 3.5–5.2)
Sodium: 138 mmol/L (ref 134–144)

## 2018-07-03 LAB — CBC
Hematocrit: 39.7 % (ref 37.5–51.0)
Hemoglobin: 13.8 g/dL (ref 13.0–17.7)
MCH: 31.2 pg (ref 26.6–33.0)
MCHC: 34.8 g/dL (ref 31.5–35.7)
MCV: 90 fL (ref 79–97)
Platelets: 190 10*3/uL (ref 150–450)
RBC: 4.43 x10E6/uL (ref 4.14–5.80)
RDW: 12.3 % (ref 11.6–15.4)
WBC: 5.9 10*3/uL (ref 3.4–10.8)

## 2018-07-06 ENCOUNTER — Telehealth: Payer: Self-pay | Admitting: *Deleted

## 2018-07-06 NOTE — Telephone Encounter (Signed)
Pt contacted pre-catheterization scheduled at [facility] for: Tuesday July 07, 2018 12 noon Verified arrival time and place: [facility] Main Entrance A at: 10 AM  No solid food after midnight prior to cath, clear liquids until 5 AM day of procedure. Contrast allergy: no  Hold: Metformin-day of procedure and 48 hours post procedure.  Except hold medications AM meds can be  taken pre-cath with sip of water including: ASA 81 mg  Confirmed patient has responsible person to drive home post procedure and observe 24 hours after arriving home: yes

## 2018-07-07 ENCOUNTER — Ambulatory Visit (HOSPITAL_COMMUNITY)
Admission: RE | Admit: 2018-07-07 | Discharge: 2018-07-07 | Disposition: A | Discharge status: Home / Self Care | Payer: Commercial Managed Care - PPO | Attending: Cardiovascular Disease | Admitting: Cardiovascular Disease

## 2018-07-07 ENCOUNTER — Other Ambulatory Visit: Payer: Self-pay

## 2018-07-07 ENCOUNTER — Encounter (HOSPITAL_COMMUNITY)
Admission: RE | Disposition: A | Discharge status: Home / Self Care | Payer: Self-pay | Source: Home / Self Care | Attending: Cardiovascular Disease

## 2018-07-07 DIAGNOSIS — Z7982 Long term (current) use of aspirin: Secondary | ICD-10-CM | POA: Insufficient documentation

## 2018-07-07 DIAGNOSIS — Z791 Long term (current) use of non-steroidal anti-inflammatories (NSAID): Secondary | ICD-10-CM | POA: Insufficient documentation

## 2018-07-07 DIAGNOSIS — Z7984 Long term (current) use of oral hypoglycemic drugs: Secondary | ICD-10-CM | POA: Diagnosis not present

## 2018-07-07 DIAGNOSIS — Z79899 Other long term (current) drug therapy: Secondary | ICD-10-CM | POA: Insufficient documentation

## 2018-07-07 DIAGNOSIS — I350 Nonrheumatic aortic (valve) stenosis: Secondary | ICD-10-CM

## 2018-07-07 DIAGNOSIS — I352 Nonrheumatic aortic (valve) stenosis with insufficiency: Secondary | ICD-10-CM | POA: Insufficient documentation

## 2018-07-07 DIAGNOSIS — E119 Type 2 diabetes mellitus without complications: Secondary | ICD-10-CM | POA: Insufficient documentation

## 2018-07-07 DIAGNOSIS — E782 Mixed hyperlipidemia: Secondary | ICD-10-CM | POA: Insufficient documentation

## 2018-07-07 DIAGNOSIS — I10 Essential (primary) hypertension: Secondary | ICD-10-CM | POA: Insufficient documentation

## 2018-07-07 DIAGNOSIS — K219 Gastro-esophageal reflux disease without esophagitis: Secondary | ICD-10-CM | POA: Diagnosis not present

## 2018-07-07 DIAGNOSIS — I712 Thoracic aortic aneurysm, without rupture: Secondary | ICD-10-CM | POA: Insufficient documentation

## 2018-07-07 DIAGNOSIS — Z8249 Family history of ischemic heart disease and other diseases of the circulatory system: Secondary | ICD-10-CM | POA: Insufficient documentation

## 2018-07-07 HISTORY — PX: RIGHT HEART CATH AND CORONARY ANGIOGRAPHY: CATH118264

## 2018-07-07 LAB — POCT I-STAT EG7
Bicarbonate: 26 mmol/L (ref 20.0–28.0)
Bicarbonate: 26.8 mmol/L (ref 20.0–28.0)
Calcium, Ion: 1.25 mmol/L (ref 1.15–1.40)
Calcium, Ion: 1.27 mmol/L (ref 1.15–1.40)
HCT: 37 % — ABNORMAL LOW (ref 39.0–52.0)
HCT: 37 % — ABNORMAL LOW (ref 39.0–52.0)
Hemoglobin: 12.6 g/dL — ABNORMAL LOW (ref 13.0–17.0)
Hemoglobin: 12.6 g/dL — ABNORMAL LOW (ref 13.0–17.0)
O2 Saturation: 68 %
O2 Saturation: 72 %
PCO2 VEN: 47.8 mmHg (ref 44.0–60.0)
Potassium: 4 mmol/L (ref 3.5–5.1)
Potassium: 4.1 mmol/L (ref 3.5–5.1)
Sodium: 139 mmol/L (ref 135–145)
Sodium: 140 mmol/L (ref 135–145)
TCO2: 27 mmol/L (ref 22–32)
TCO2: 28 mmol/L (ref 22–32)
pCO2, Ven: 49.6 mmHg (ref 44.0–60.0)
pH, Ven: 7.341 (ref 7.250–7.430)
pH, Ven: 7.344 (ref 7.250–7.430)
pO2, Ven: 38 mmHg (ref 32.0–45.0)
pO2, Ven: 41 mmHg (ref 32.0–45.0)

## 2018-07-07 LAB — POCT I-STAT 7, (LYTES, BLD GAS, ICA,H+H)
Bicarbonate: 25.7 mmol/L (ref 20.0–28.0)
Calcium, Ion: 1.24 mmol/L (ref 1.15–1.40)
HCT: 37 % — ABNORMAL LOW (ref 39.0–52.0)
Hemoglobin: 12.6 g/dL — ABNORMAL LOW (ref 13.0–17.0)
O2 Saturation: 98 %
Potassium: 4.1 mmol/L (ref 3.5–5.1)
Sodium: 139 mmol/L (ref 135–145)
TCO2: 27 mmol/L (ref 22–32)
pCO2 arterial: 43.5 mmHg (ref 32.0–48.0)
pH, Arterial: 7.38 (ref 7.350–7.450)
pO2, Arterial: 105 mmHg (ref 83.0–108.0)

## 2018-07-07 LAB — GLUCOSE, CAPILLARY
GLUCOSE-CAPILLARY: 118 mg/dL — AB (ref 70–99)
Glucose-Capillary: 109 mg/dL — ABNORMAL HIGH (ref 70–99)

## 2018-07-07 LAB — POCT ACTIVATED CLOTTING TIME: Activated Clotting Time: 142 seconds

## 2018-07-07 SURGERY — RIGHT HEART CATH AND CORONARY ANGIOGRAPHY
Anesthesia: LOCAL

## 2018-07-07 MED ORDER — MIDAZOLAM HCL 2 MG/2ML IJ SOLN
INTRAMUSCULAR | Status: AC
  Filled 2018-07-07: qty 2

## 2018-07-07 MED ORDER — LIDOCAINE HCL (PF) 1 % IJ SOLN
INTRAMUSCULAR | Status: DC | PRN
  Administered 2018-07-07: 20 mL
  Administered 2018-07-07 (×2): 2 mL via SUBCUTANEOUS

## 2018-07-07 MED ORDER — ONDANSETRON HCL 4 MG/2ML IJ SOLN: 4.0000 mg | Freq: Four times a day (QID) | INTRAMUSCULAR | Status: DC | PRN

## 2018-07-07 MED ORDER — IOHEXOL 350 MG/ML SOLN
INTRAVENOUS | Status: DC | PRN
  Administered 2018-07-07: 190 mL via INTRA_ARTERIAL

## 2018-07-07 MED ORDER — ASPIRIN 81 MG PO CHEW: 81.0000 mg | CHEWABLE_TABLET | ORAL | Status: DC

## 2018-07-07 MED ORDER — SODIUM CHLORIDE 0.9% FLUSH: 3.0000 mL | Freq: Two times a day (BID) | INTRAVENOUS | Status: DC

## 2018-07-07 MED ORDER — SODIUM CHLORIDE 0.9 % IV SOLN: INTRAVENOUS | Status: DC

## 2018-07-07 MED ORDER — VERAPAMIL HCL 2.5 MG/ML IV SOLN
INTRAVENOUS | Status: AC
  Filled 2018-07-07: qty 2

## 2018-07-07 MED ORDER — ACETAMINOPHEN 325 MG PO TABS: 650.0000 mg | ORAL_TABLET | ORAL | Status: DC | PRN

## 2018-07-07 MED ORDER — HEPARIN (PORCINE) IN NACL 1000-0.9 UT/500ML-% IV SOLN
INTRAVENOUS | Status: DC | PRN
  Administered 2018-07-07 (×2): 500 mL

## 2018-07-07 MED ORDER — SODIUM CHLORIDE 0.9% FLUSH: 3.0000 mL | INTRAVENOUS | Status: DC | PRN

## 2018-07-07 MED ORDER — DIAZEPAM 5 MG PO TABS: 5.0000 mg | ORAL_TABLET | Freq: Four times a day (QID) | ORAL | Status: DC | PRN

## 2018-07-07 MED ORDER — FENTANYL CITRATE (PF) 100 MCG/2ML IJ SOLN
INTRAMUSCULAR | Status: AC
  Filled 2018-07-07: qty 2

## 2018-07-07 MED ORDER — SODIUM CHLORIDE 0.9 % WEIGHT BASED INFUSION: 1.0000 mL/kg/h | INTRAVENOUS | Status: DC

## 2018-07-07 MED ORDER — SODIUM CHLORIDE 0.9 % IV SOLN: 250.0000 mL | INTRAVENOUS | Status: DC | PRN

## 2018-07-07 MED ORDER — FENTANYL CITRATE (PF) 100 MCG/2ML IJ SOLN
INTRAMUSCULAR | Status: DC | PRN
  Administered 2018-07-07 (×2): 25 ug via INTRAVENOUS

## 2018-07-07 MED ORDER — MIDAZOLAM HCL 2 MG/2ML IJ SOLN
INTRAMUSCULAR | Status: DC | PRN
  Administered 2018-07-07 (×3): 1 mg via INTRAVENOUS

## 2018-07-07 MED ORDER — HEPARIN SODIUM (PORCINE) 1000 UNIT/ML IJ SOLN
INTRAMUSCULAR | Status: DC | PRN
  Administered 2018-07-07: 5000 [IU] via INTRAVENOUS

## 2018-07-07 MED ORDER — VERAPAMIL HCL 2.5 MG/ML IV SOLN
INTRAVENOUS | Status: DC | PRN
  Administered 2018-07-07: 11:00:00 via INTRA_ARTERIAL

## 2018-07-07 MED ORDER — SODIUM CHLORIDE 0.9 % WEIGHT BASED INFUSION
3.0000 mL/kg/h | INTRAVENOUS | Status: AC
  Administered 2018-07-07: 3 mL/kg/h via INTRAVENOUS

## 2018-07-07 MED ORDER — HEPARIN (PORCINE) IN NACL 1000-0.9 UT/500ML-% IV SOLN
INTRAVENOUS | Status: AC
  Filled 2018-07-07: qty 1000

## 2018-07-07 MED ORDER — LIDOCAINE HCL (PF) 1 % IJ SOLN
INTRAMUSCULAR | Status: AC
  Filled 2018-07-07: qty 30

## 2018-07-07 SURGICAL SUPPLY — 26 items
CATH BALLN WEDGE 5F 110CM (CATHETERS) ×2 IMPLANT
CATH INFINITI 5 FR JL3.5 (CATHETERS) ×2 IMPLANT
CATH INFINITI 5FR AL1 (CATHETERS) ×2 IMPLANT
CATH INFINITI 5FR ANG PIGTAIL (CATHETERS) ×2 IMPLANT
CATH INFINITI 5FR JK (CATHETERS) ×2 IMPLANT
CATH INFINITI 5FR JL4 (CATHETERS) ×2 IMPLANT
CATH INFINITI JR4 5F (CATHETERS) ×2 IMPLANT
CATH LAUNCHER 5F EBU4.0 (CATHETERS) ×2 IMPLANT
CATH LAUNCHER 5F JL5 (CATHETERS) ×1 IMPLANT
CATH LAUNCHER 5F RADL (CATHETERS) ×1 IMPLANT
CATH OPTITORQUE TIG 4.0 5F (CATHETERS) ×2 IMPLANT
CATHETER LAUNCHER 5F JL5 (CATHETERS) ×2
CATHETER LAUNCHER 5F RADL (CATHETERS) ×2
DEVICE RAD COMP TR BAND LRG (VASCULAR PRODUCTS) ×2 IMPLANT
GLIDESHEATH SLEND SS 6F .021 (SHEATH) ×2 IMPLANT
GUIDEWIRE INQWIRE 1.5J.035X260 (WIRE) ×1 IMPLANT
INQWIRE 1.5J .035X260CM (WIRE) ×2
KIT HEART LEFT (KITS) ×2 IMPLANT
PACK CARDIAC CATHETERIZATION (CUSTOM PROCEDURE TRAY) ×2 IMPLANT
SHEATH GLIDE SLENDER 4/5FR (SHEATH) ×2 IMPLANT
SHEATH PINNACLE 5F 10CM (SHEATH) ×2 IMPLANT
SYR MEDRAD MARK 7 150ML (SYRINGE) ×2 IMPLANT
TRANSDUCER W/STOPCOCK (MISCELLANEOUS) ×2 IMPLANT
TUBING CIL FLEX 10 FLL-RA (TUBING) ×2 IMPLANT
WIRE EMERALD 3MM-J .035X150CM (WIRE) ×2 IMPLANT
WIRE EMERALD ST .035X150CM (WIRE) ×2 IMPLANT

## 2018-07-07 NOTE — Progress Notes (Signed)
rn ambulated with patient. No hematoma formed outside of marked area. Area inside marking soft

## 2018-07-07 NOTE — Progress Notes (Addendum)
Pressure held x 20 minutes.  Site soft and no bleeding noted. Area marked with surgical marker for reference

## 2018-07-07 NOTE — Progress Notes (Signed)
Swelling noted after ambulation.  RN Haily Caley and Liborio Nixon applying manual pressure.  Spoke with Dr. Tresa Endo.  Continue pressure and keep patient x1 hour and attempt ambulation

## 2018-07-07 NOTE — Interval H&P Note (Signed)
History and Physical Interval Note:  07/07/2018 10:40 AM  Ronnie Ruiz  has presented today for surgery, with the diagnosis of Aortic Stenosis.  The various methods of treatment have been discussed with the patient and family. After consideration of risks, benefits and other options for treatment, the patient has consented to  Procedure(s): RIGHT/LEFT HEART CATH AND CORONARY ANGIOGRAPHY (N/A) as a surgical intervention.  The patient's history has been reviewed, patient examined, no change in status, stable for surgery.  I have reviewed the patient's chart and labs.  Questions were answered to the patient's satisfaction.     Nicki Guadalajara

## 2018-07-07 NOTE — Progress Notes (Signed)
66fr sheath removed from RFA using manual pressure which was held for 25 minutes.  No hematoma noted during or after sheath pull and patient tolerated well.  Site was unremarkable and dressed with gauze and a tegaderm.  Post care instructions were given to patient.

## 2018-07-07 NOTE — Discharge Instructions (Signed)
Femoral Site Care  This sheet gives you information about how to care for yourself after your procedure. Your health care provider may also give you more specific instructions. If you have problems or questions, contact your health care provider.  What can I expect after the procedure?  After the procedure, it is common to have:   Bruising that usually fades within 1-2 weeks.   Tenderness at the site.  Follow these instructions at home:  Wound care   Follow instructions from your health care provider about how to take care of your insertion site. Make sure you:  ? Wash your hands with soap and water before you change your bandage (dressing). If soap and water are not available, use hand sanitizer.  ? Change your dressing as told by your health care provider.  ? Leave stitches (sutures), skin glue, or adhesive strips in place. These skin closures may need to stay in place for 2 weeks or longer. If adhesive strip edges start to loosen and curl up, you may trim the loose edges. Do not remove adhesive strips completely unless your health care provider tells you to do that.   Do not take baths, swim, or use a hot tub until your health care provider approves.   You may shower 24-48 hours after the procedure or as told by your health care provider.  ? Gently wash the site with plain soap and water.  ? Pat the area dry with a clean towel.  ? Do not rub the site. This may cause bleeding.   Do not apply powder or lotion to the site. Keep the site clean and dry.   Check your femoral site every day for signs of infection. Check for:  ? Redness, swelling, or pain.  ? Fluid or blood.  ? Warmth.  ? Pus or a bad smell.  Activity   For the first 2-3 days after your procedure, or as long as directed:  ? Avoid climbing stairs as much as possible.  ? Do not squat.   Do not lift anything that is heavier than 10 lb (4.5 kg), or the limit that you are told, until your health care provider says that it is safe.   Rest as  directed.  ? Avoid sitting for a long time without moving. Get up to take short walks every 1-2 hours.   Do not drive for 24 hours if you were given a medicine to help you relax (sedative).  General instructions   Take over-the-counter and prescription medicines only as told by your health care provider.   Keep all follow-up visits as told by your health care provider. This is important.  Contact a health care provider if you have:   A fever or chills.   You have redness, swelling, or pain around your insertion site.  Get help right away if:   The catheter insertion area swells very fast.   You pass out.   You suddenly start to sweat or your skin gets clammy.   The catheter insertion area is bleeding, and the bleeding does not stop when you hold steady pressure on the area.   The area near or just beyond the catheter insertion site becomes pale, cool, tingly, or numb.  These symptoms may represent a serious problem that is an emergency. Do not wait to see if the symptoms will go away. Get medical help right away. Call your local emergency services (911 in the [place] Do not drive yourself to the hospital.    Summary   After the procedure, it is common to have bruising that usually fades within 1-2 weeks.   Check your femoral site every day for signs of infection.   Do not lift anything that is heavier than 10 lb (4.5 kg), or the limit that you are told, until your health care provider says that it is safe.  This information is not intended to replace advice given to you by your health care provider. Make sure you discuss any questions you have with your health care provider.  Document Released: 12/10/2013 Document Revised: 04/21/2017 Document Reviewed: 04/21/2017  Elsevier Interactive Patient Education  2019 [employer] Inc[employer]  Radial Site Care    This sheet gives you information about how to care for yourself after your procedure. Your health care provider may also give you more specific instructions. If you  have problems or questions, contact your health care provider.  What can I expect after the procedure?  After the procedure, it is common to have:   Bruising and tenderness at the catheter insertion area.  Follow these instructions at home:  Medicines   Take over-the-counter and prescription medicines only as told by your health care provider.  Insertion site care   Follow instructions from your health care provider about how to take care of your insertion site. Make sure you:  ? Wash your hands with soap and water before you change your bandage (dressing). If soap and water are not available, use hand sanitizer.  ? Change your dressing as told by your health care provider.  ? Leave stitches (sutures), skin glue, or adhesive strips in place. These skin closures may need to stay in place for 2 weeks or longer. If adhesive strip edges start to loosen and curl up, you may trim the loose edges. Do not remove adhesive strips completely unless your health care provider tells you to do that.   Check your insertion site every day for signs of infection. Check for:  ? Redness, swelling, or pain.  ? Fluid or blood.  ? Pus or a bad smell.  ? Warmth.   Do not take baths, swim, or use a hot tub until your health care provider approves.   You may shower 24-48 hours after the procedure, or as directed by your health care provider.  ? Remove the dressing and gently wash the site with plain soap and water.  ? Pat the area dry with a clean towel.  ? Do not rub the site. That could cause bleeding.   Do not apply powder or lotion to the site.  Activity     For 24 hours after the procedure, or as directed by your health care provider:  ? Do not flex or bend the affected arm.  ? Do not push or pull heavy objects with the affected arm.  ? Do not drive yourself home from the hospital or clinic. You may drive 24 hours after the procedure unless your health care provider tells you not to.  ? Do not operate machinery or power  tools.   Do not lift anything that is heavier than 10 lb (4.5 kg), or the limit that you are told, until your health care provider says that it is safe.   Ask your health care provider when it is okay to:  ? Return to work or school.  ? Resume usual physical activities or sports.  ? Resume sexual activity.  General instructions   If the catheter site starts to bleed, raise   your arm and put firm pressure on the site. If the bleeding does not stop, get help right away. This is a medical emergency.   If you went home on the same day as your procedure, a responsible adult should be with you for the first 24 hours after you arrive home.   Keep all follow-up visits as told by your health care provider. This is important.  Contact a health care provider if:   You have a fever.   You have redness, swelling, or yellow drainage around your insertion site.  Get help right away if:   You have unusual pain at the radial site.   The catheter insertion area swells very fast.   The insertion area is bleeding, and the bleeding does not stop when you hold steady pressure on the area.   Your arm or hand becomes pale, cool, tingly, or numb.  These symptoms may represent a serious problem that is an emergency. Do not wait to see if the symptoms will go away. Get medical help right away. Call your local emergency services (911 in the [place] Do not drive yourself to the hospital.  Summary   After the procedure, it is common to have bruising and tenderness at the site.   Follow instructions from your health care provider about how to take care of your radial site wound. Check the wound every day for signs of infection.   Do not lift anything that is heavier than 10 lb (4.5 kg), or the limit that you are told, until your health care provider says that it is safe.  This information is not intended to replace advice given to you by your health care provider. Make sure you discuss any questions you have with your health care  provider.  Document Released: 05/11/2010 Document Revised: 05/14/2017 Document Reviewed: 05/14/2017  Elsevier Interactive Patient Education  2019 [employer] Inc[employer]

## 2018-07-08 ENCOUNTER — Encounter (HOSPITAL_COMMUNITY): Payer: Self-pay | Admitting: Cardiovascular Disease

## 2018-08-11 ENCOUNTER — Telehealth: Payer: Self-pay | Admitting: Cardiology

## 2018-08-11 NOTE — Telephone Encounter (Signed)
Virtual Visit Pre-Appointment Phone Call  "(Name), I am calling you today to discuss your upcoming appointment. We are currently trying to limit exposure to the virus that causes COVID-19 by seeing patients at home rather than in the office."  1. "What is the BEST phone number to call the day of the visit?" - include this in appointment notes  2. Do you have or have access to (through a family member/friend) a smartphone with video capability that we can use for your visit?" a. If yes - list this number in appt notes as cell (if different from BEST phone #) and list the appointment type as a VIDEO visit in appointment notes b. If no - list the appointment type as a PHONE visit in appointment notes  3. Confirm consent - "In the setting of the current Covid19 crisis, you are scheduled for a (phone or video) visit with your provider on (date) at (time).  Just as we do with many in-office visits, in order for you to participate in this visit, we must obtain consent.  If you'd like, I can send this to your mychart (if signed up) or email for you to review.  Otherwise, I can obtain your verbal consent now.  All virtual visits are billed to your insurance company just like a normal visit would be.  By agreeing to a virtual visit, we'd like you to understand that the technology does not allow for your provider to perform an examination, and thus may limit your provider's ability to fully assess your condition. If your provider identifies any concerns that need to be evaluated in person, we will make arrangements to do so.  Finally, though the technology is pretty good, we cannot assure that it will always work on either your or our end, and in the setting of a video visit, we may have to convert it to a phone-only visit.  In either situation, we cannot ensure that we have a secure connection.  Are you willing to proceed?" STAFF: Did the patient verbally acknowledge consent to telehealth visit? Document  YES/NO here: YES  4. Advise patient to be prepared - "Two hours prior to your appointment, go ahead and check your blood pressure, pulse, oxygen saturation, and your weight (if you have the equipment to check those) and write them all down. When your visit starts, your provider will ask you for this information. If you have an Apple Watch or Kardia device, please plan to have heart rate information ready on the day of your appointment. Please have a pen and paper handy nearby the day of the visit as well."  5. Give patient instructions for MyChart download to smartphone OR Doximity/Doxy.me as below if video visit (depending on what platform provider is using)  6. Inform patient they will receive a phone call 15 minutes prior to their appointment time (may be from unknown caller ID) so they should be prepared to answer    TELEPHONE CALL NOTE  Ronnie Ruiz Donoghue has been deemed a candidate for a follow-up tele-health visit to limit community exposure during the Covid-19 pandemic. I spoke with the patient via phone to ensure availability of phone/video source, confirm preferred email & phone number, and discuss instructions and expectations.  I reminded Ronnie Ruiz Counce to be prepared with any vital sign and/or heart rhythm information that could potentially be obtained via home monitoring, at the time of his visit. I reminded Ronnie Ruiz Bahner to expect a phone call prior to  his visit.  Kittie Plater 08/11/2018 2:50 PM   INSTRUCTIONS FOR DOWNLOADING THE MYCHART APP TO SMARTPHONE  - The patient must first make sure to have activated MyChart and know their login information - If Apple, go to [employer] and type in MyChart in the search bar and download the app. If Android, ask patient to go to [employer] and type in [place] in the search bar and download the app. The app is free but as with any other app downloads, their phone may require them to verify saved payment information or Apple/Android  password.  - The patient will need to then log into the app with their MyChart username and password, and select [facility] as their healthcare provider to link the account. When it is time for your visit, go to the MyChart app, find appointments, and click Begin Video Visit. Be sure to Select Allow for your device to access the Microphone and Camera for your visit. You will then be connected, and your provider will be with you shortly.  **If they have any issues connecting, or need assistance please contact MyChart service desk [phone])**  **If using a computer, in order to ensure the best quality for their visit they will need to use either of the following Internet Browsers: [employer] or Google Chrome**  IF USING DOXIMITY or DOXY.ME - The patient will receive a link just prior to their visit by text.     FULL LENGTH CONSENT FOR TELE-HEALTH VISIT   I hereby voluntarily request, consent and authorize [facility] and its employed or contracted physicians, physician assistants, nurse practitioners or other licensed health care professionals (the Practitioner), to provide me with telemedicine health care services (the Services") as deemed necessary by the treating Practitioner. I acknowledge and consent to receive the Services by the Practitioner via telemedicine. I understand that the telemedicine visit will involve communicating with the Practitioner through live audiovisual communication technology and the disclosure of certain medical information by electronic transmission. I acknowledge that I have been given the opportunity to request an in-person assessment or other available alternative prior to the telemedicine visit and am voluntarily participating in the telemedicine visit.  I understand that I have the right to withhold or withdraw my consent to the use of telemedicine in the course of my care at any time, without affecting my right to future care or treatment,  and that the Practitioner or I may terminate the telemedicine visit at any time. I understand that I have the right to inspect all information obtained and/or recorded in the course of the telemedicine visit and may receive copies of available information for a reasonable fee.  I understand that some of the potential risks of receiving the Services via telemedicine include:   Delay or interruption in medical evaluation due to technological equipment failure or disruption;  Information transmitted may not be sufficient (e.g. poor resolution of images) to allow for appropriate medical decision making by the Practitioner; and/or   In rare instances, security protocols could fail, causing a breach of personal health information.  Furthermore, I acknowledge that it is my responsibility to provide information about my medical history, conditions and care that is complete and accurate to the best of my ability. I acknowledge that Practitioner's advice, recommendations, and/or decision may be based on factors not within their control, such as incomplete or inaccurate data provided by me or distortions of diagnostic images or specimens that may result from electronic transmissions. I  understand that the practice of medicine is not an exact science and that Practitioner makes no warranties or guarantees regarding treatment outcomes. I acknowledge that I will receive a copy of this consent concurrently upon execution via email to the email address I last provided but may also request a printed copy by calling the office of [place].    I understand that my insurance will be billed for this visit.   I have read or had this consent read to me.  I understand the contents of this consent, which adequately explains the benefits and risks of the Services being provided via telemedicine.   I have been provided ample opportunity to ask questions regarding this consent and the Services and have had my questions  answered to my satisfaction.  I give my informed consent for the services to be provided through the use of telemedicine in my medical care  By participating in this telemedicine visit I agree to the above.

## 2018-08-17 ENCOUNTER — Other Ambulatory Visit: Payer: Self-pay

## 2018-08-17 ENCOUNTER — Telehealth (INDEPENDENT_AMBULATORY_CARE_PROVIDER_SITE_OTHER): Payer: Commercial Managed Care - PPO | Admitting: Cardiology

## 2018-08-17 ENCOUNTER — Encounter: Payer: Self-pay | Admitting: Cardiology

## 2018-08-17 DIAGNOSIS — I712 Thoracic aortic aneurysm, without rupture: Secondary | ICD-10-CM

## 2018-08-17 DIAGNOSIS — I10 Essential (primary) hypertension: Secondary | ICD-10-CM

## 2018-08-17 DIAGNOSIS — I350 Nonrheumatic aortic (valve) stenosis: Secondary | ICD-10-CM | POA: Diagnosis not present

## 2018-08-17 DIAGNOSIS — E782 Mixed hyperlipidemia: Secondary | ICD-10-CM

## 2018-08-17 DIAGNOSIS — I7121 Aneurysm of the ascending aorta, without rupture: Secondary | ICD-10-CM

## 2018-08-17 DIAGNOSIS — E119 Type 2 diabetes mellitus without complications: Secondary | ICD-10-CM

## 2018-08-17 NOTE — Patient Instructions (Signed)
Medication Instructions:  Your physician recommends that you continue on your current medications as directed. Please refer to the Current Medication list given to you today.  If you need a refill on your cardiac medications before your next appointment, please call your pharmacy.   Lab work: None If you have labs (blood work) drawn today and your tests are completely normal, you will receive your results only by: Marland Kitchen MyChart Message (if you have MyChart) OR . A paper copy in the mail If you have any lab test that is abnormal or we need to change your treatment, we will call you to review the results.  Testing/Procedures: None  Follow-Up: At [facility], you and your health needs are our priority.  As part of our continuing mission to provide you with exceptional heart care, we have created designated Provider Care Teams.  These Care Teams include your primary Cardiologist (physician) and Advanced Practice Providers (APPs -  Physician Assistants and Nurse Practitioners) who all work together to provide you with the care you need, when you need it. You will need a follow up appointment in 1 months.   Any Other Special Instructions Will Be Listed Below (If Applicable).  DR. Bing Matter has referred you to cardiothoracic surgery. They should call you in the next couple of days. Please call the office if you have not heard from them.

## 2018-08-17 NOTE — Progress Notes (Signed)
Virtual Visit via Telephone Note   This visit type was conducted due to national recommendations for restrictions regarding the COVID-19 Pandemic (e.g. social distancing) in an effort to limit this patient's exposure and mitigate transmission in our community.  Due to his co-morbid illnesses, this patient is at least at moderate risk for complications without adequate follow up.  This format is felt to be most appropriate for this patient at this time.  The patient did not have access to video technology/had technical difficulties with video requiring transitioning to audio format only (telephone).  All issues noted in this document were discussed and addressed.  No physical exam could be performed with this format.  Please refer to the patient's chart for his  consent to telehealth for [facility].  Evaluation Performed:  Follow-up visit  This visit type was conducted due to national recommendations for restrictions regarding the COVID-19 Pandemic (e.g. social distancing).  This format is felt to be most appropriate for this patient at this time.  All issues noted in this document were discussed and addressed.  No physical exam was performed (except for noted visual exam findings with Video Visits).  Please refer to the patient's chart (MyChart message for video visits and phone note for telephone visits) for the patient's consent to telehealth for [facility].  Date:  08/17/2018  ID: Ronnie Ruiz, DOB Jan 04, 1966, MRN [id number]   Patient Location: [address] [place] [address]   Provider location:   [facility] [place] [facility]  PCP:  [name], MD  Cardiologist:  Gypsy Balsam, MD     Chief Complaint: I am doing fair  History of Present Illness:    Ronnie Ruiz is a 53 y.o. male  who presents via audio/video conferencing for a telehealth visit today.  With critical aortic stenosis with mean gradient 64.  Likely his cardiac catheterization did not show  any significant coronary artery disease.  He is additional history include also diabetes, EtOH abuse,.  Recently he got cardiac catheterization which confirms severity of his aortic stenosis.  Because of coronavirus however we will hold with the potential surgery.  He is doing well but described when he tried to walk further distance he would get tired easily.  No chest pain no syncope.  I told him he need to take it easy until we do surgery.  Again we talked about options in this situation it looks like the most visible approach will be to do surgery we will schedule him to he see a Careers adviser.   The patient does not have symptoms concerning for COVID-19 infection (fever, chills, cough, or new SHORTNESS OF BREATH).    Prior CV studies:   The following studies were reviewed today:  Cardiac catheterization done on 07/07/2018 showed:  Prox LAD lesion is 5% stenosed.  There is trivial (1+) aortic regurgitation.   Mild coronary calcification without significant coronary obstructive disease with a normal left main, minimal luminal narrowing of 5% in the LAD; normal dominant left circumflex coronary artery, and normal nondominant RCA.  Mildly increased right heart pressures.  Dilated aortic root.  Severely calcified previously diagnosed bicuspid aortic valve with markedly reduced excursion.  There is trace to mild aortic insufficiency.  The aortic valve was not able to be crossed.  Echocardiographic gradient assessment revealed a mean gradient of 64, peak instantaneous gradient of 100 mmHg and a calculated valve area of 0.6 to 0.7 cm consistent with critical aortic stenosis.   Echocardiographic  documentation of normal LV function with an EF of 60 to 65%.  RECOMMENDATION: The patient is followed by Dr. Bing Matter.  Surgical consultation will be necessary.  In this 53 year old patient with bicuspid valve, probable SAVR rather than TAVR will be necessary.      Past Medical History:   Diagnosis Date  . Alcoholism (HCC) 06/24/2018  . Diabetes mellitus type 2, controlled (HCC) 06/24/2018  . Essential hypertension 06/24/2018  . GERD (gastroesophageal reflux disease) 06/24/2018  . Heart murmur 06/24/2018  . Hyperlipidemia 06/24/2018       Current Meds  Medication Sig  . aspirin EC 81 MG tablet Take 81 mg by mouth daily.  Marland Kitchen esomeprazole (NEXIUM) 20 MG packet Take 20 mg by mouth daily before breakfast.  . ibuprofen (ADVIL,MOTRIN) 800 MG tablet Take 800 mg by mouth every 8 (eight) hours as needed.  Marland Kitchen lisinopril (PRINIVIL,ZESTRIL) 10 MG tablet Take 0.5 tablets (5 mg total) by mouth daily.  . metFORMIN (GLUCOPHAGE) 500 MG tablet Take 500 mg by mouth 2 (two) times daily with a meal.  . metoprolol succinate (TOPROL-XL) 50 MG 24 hr tablet Take 50 mg by mouth daily. Take with or immediately following a meal.  . nitroGLYCERIN (NITROSTAT) 0.4 MG SL tablet Place 0.4 mg under the tongue every 5 (five) minutes as needed for chest pain.  . Omega-3 Fatty Acids (FISH OIL) 1000 MG CAPS Take 1,000 mg by mouth 2 (two) times daily.   . simvastatin (ZOCOR) 40 MG tablet Take 40 mg by mouth daily.      Family History: The patient's family history includes Alcoholism in his father; Anxiety disorder in his father; CAD in his father; Hyperlipidemia in his father; Lung cancer in his father and mother; Pulmonary disease in his father and mother; Renal cancer in his father.   ROS:   Please see the history of present illness.     All other systems reviewed and are negative.   Labs/Other Tests and Data Reviewed:     Recent Labs: 07/02/2018: BUN 10; Creatinine, Ser 0.83; Platelets 190 07/07/2018: Hemoglobin 12.6; Potassium 4.1; Sodium 139  Recent Lipid Panel No results found for: CHOL, TRIG, HDL, CHOLHDL, VLDL, LDLCALC, LDLDIRECT    Exam:    Vital Signs:  There were no vitals taken for this visit.    Wt Readings from Last 3 Encounters:  07/02/18 217 lb 1.9 oz (98.5 kg)  06/25/18 218 lb 12.8 oz  (99.2 kg)     Well nourished, well developed in no acute distress. Alert awake currently x3 talking to me over the phone we were unable to establish video link.  Seems to be comfortable  Diagnosis for this visit:   1. Critical aortic valve stenosis mean gradient 64 mmHg,   2. Essential hypertension   3. Ascending aortic aneurysm (HCC) 41 mm as measured by echo   4. Controlled type 2 diabetes mellitus without complication, without long-term current use of insulin (HCC)   5. Mixed hyperlipidemia      ASSESSMENT & PLAN:    1.  Critical aortic stenosis etiology most likely bicuspid.  Will refer him to surgeons for operation.  I explained to him options including different type of valves he will have discussion with surgery to decide will be the best option for him. 2.  Essential hypertension we will continue present management. 3.  Ascending aortic aneurysm measured 41 mm by echocardiogram most likely will require CT before his surgery. 4.  Controlled type 2 diabetes.  Followed  by internal medicine team. 5.  Mixed dyslipidemia we will continue with statin  COVID-19 Education: The signs and symptoms of COVID-19 were discussed with the patient and how to seek care for testing (follow up with PCP or arrange E-visit).  The importance of social distancing was discussed today.  Patient Risk:   After full review of this patients clinical status, I feel that they are at least moderate risk at this time.  Time:   Today, I have spent 17 minutes with the patient with telehealth technology discussing pt health issues.  I spent 5 minutes reviewing her chart before the visit.  Visit was finished at 3:32 PM.    Medication Adjustments/Labs and Tests Ordered: Current medicines are reviewed at length with the patient today.  Concerns regarding medicines are outlined above.  Orders Placed This Encounter  Procedures  . Ambulatory referral to Cardiothoracic Surgery   Medication changes: No orders of  the defined types were placed in this encounter.    Disposition: Appointment with cardiothoracic surgery for aortic valve replacement.  Follow-up with me after surgery  Signed, Georgeanna Leaobert J. Tonae Livolsi, MD, [facility]ACC 08/17/2018 3:57 PM    [facility]

## 2018-08-25 ENCOUNTER — Other Ambulatory Visit: Payer: Self-pay

## 2018-08-26 ENCOUNTER — Encounter: Payer: Self-pay | Admitting: Surgery

## 2018-08-26 ENCOUNTER — Institutional Professional Consult (permissible substitution): Payer: Commercial Managed Care - PPO | Admitting: Surgery

## 2018-08-26 VITALS — BP 134/84 | HR 80 | Temp 97.7°F | Resp 20 | Ht 68.5 in | Wt 218.0 lb

## 2018-08-26 DIAGNOSIS — I350 Nonrheumatic aortic (valve) stenosis: Secondary | ICD-10-CM | POA: Diagnosis not present

## 2018-08-26 NOTE — Progress Notes (Signed)
Cardiothoracic Surgery Consultation  PCP is [name], MD Referring Provider is Georgeanna Lea, MD  Chief Complaint  Patient presents with   Aortic Stenosis    Surgical eval,     HPI:  The patient is a 53 year old gentleman with history of type 2 diabetes, hypertension, hyperlipidemia, and known bicuspid aortic valve disease which according the patient was diagnosed about 10 or 12 years ago.  He was seen at that time by Dr. Bing Matter but then got lost to follow-up.  He returned to see his PCP a couple months ago and was having some new symptoms and was referred back to Dr. Bing Matter.  A 2D echocardiogram on 07/01/2018 showed a mean aortic valve gradient of 64 mmHg with a peak gradient of 100 mmHg.  The ascending aorta was enlarged at 4.15 cm.  Left ventricular ejection fraction was 60 to 65%.  Right and left heart catheterization on 07/07/2018 by Dr. Tresa Endo showed no significant coronary disease.  The aortic valve could not be crossed.  Right heart pressures were normal with a PA pressure of 32/13 and a cardiac index of 2.5.  The patient is here today with his wife.  He works as a Merchandiser, retail at [employer].  He reports a several month history of exertional fatigue and shortness of breath as well as some substernal chest discomfort occurring with mild exertion.  He has had no dizziness or syncope.  He denies orthopnea and PND.  Is had no peripheral edema.  Despite this he is continued to play golf although uses a golf cart and does not chase any balls down hills.   Past Medical History:  Diagnosis Date   Alcoholism (HCC) 06/24/2018   Diabetes mellitus type 2, controlled (HCC) 06/24/2018   Essential hypertension 06/24/2018   GERD (gastroesophageal reflux disease) 06/24/2018   Heart murmur 06/24/2018   Hyperlipidemia 06/24/2018    Past Surgical History:  Procedure Laterality Date   CHOLECYSTECTOMY     RIGHT HEART CATH AND CORONARY ANGIOGRAPHY N/A 07/07/2018   Procedure: RIGHT HEART CATH AND CORONARY ANGIOGRAPHY;  Surgeon: Lennette Bihari, MD;  Location: [facility];  Service: Cardiovascular;  Laterality: N/A;    Family History  Problem Relation Age of Onset   Lung cancer Mother    Pulmonary disease Mother    Alcoholism Father    Anxiety disorder Father    Hyperlipidemia Father    Lung cancer Father    CAD Father    Renal cancer Father    Pulmonary disease Father     Social History Social History   Tobacco Use   Smoking status: Never Smoker   Smokeless tobacco: Current User    Types: Snuff  Substance Use Topics   Alcohol use: Yes   Drug use: Never    Current Outpatient Medications  Medication Sig Dispense Refill   aspirin EC 81 MG tablet Take 81 mg by mouth daily.     esomeprazole (NEXIUM) 20 MG packet Take 20 mg by mouth daily before breakfast.     ibuprofen (ADVIL,MOTRIN) 800 MG tablet Take 800 mg by mouth every 8 (eight) hours as needed.     lisinopril (PRINIVIL,ZESTRIL) 10 MG tablet Take 0.5 tablets (5 mg total) by mouth daily. 90 tablet 0   metFORMIN (GLUCOPHAGE) 500 MG tablet Take 500 mg by mouth 2 (two) times daily with a meal.     metoprolol succinate (TOPROL-XL) 50 MG 24 hr tablet Take 50 mg by mouth daily. Take with or  immediately following a meal.     nitroGLYCERIN (NITROSTAT) 0.4 MG SL tablet Place 0.4 mg under the tongue every 5 (five) minutes as needed for chest pain.     Omega-3 Fatty Acids (FISH OIL) 1000 MG CAPS Take 1,000 mg by mouth 2 (two) times daily.      simvastatin (ZOCOR) 40 MG tablet Take 40 mg by mouth daily.     No current facility-administered medications for this visit.     No Known Allergies  Review of Systems  Constitutional: Positive for fatigue.  HENT: Positive for dental problem.        He had several teeth pulled last year and said that he has some other teeth that are loose which he was planning on having pulled this year.  Eyes: Negative.   Respiratory:  Positive for chest tightness and shortness of breath.   Cardiovascular: Positive for chest pain. Negative for palpitations and leg swelling.  Gastrointestinal: Negative.   Endocrine: Negative.   Genitourinary: Negative.   Musculoskeletal: Positive for arthralgias.  Skin: Negative.   Allergic/Immunologic: Negative.   Neurological: Negative for dizziness, syncope and light-headedness.  Hematological: Negative.   Psychiatric/Behavioral: Negative.     BP 134/84    Pulse 80    Temp 97.7 F (36.5 C) (Skin)    Resp 20    Ht 5' 8.5" (1.74 m)    Wt 218 lb (98.9 kg)    SpO2 95% Comment: RA   BMI 32.66 kg/m  Physical Exam Constitutional:      Appearance: Normal appearance. He is obese.  HENT:     Head: Normocephalic and atraumatic.  Eyes:     Extraocular Movements: Extraocular movements intact.     Conjunctiva/sclera: Conjunctivae normal.     Pupils: Pupils are equal, round, and reactive to light.  Neck:     Musculoskeletal: Normal range of motion and neck supple.     Vascular: No carotid bruit.  Cardiovascular:     Rate and Rhythm: Normal rate and regular rhythm.     Pulses: Normal pulses.     Heart sounds: Murmur present.     Comments: 3/6 systolic murmur RSB Pulmonary:     Effort: Pulmonary effort is normal.     Breath sounds: Normal breath sounds.  Abdominal:     General: There is no distension.     Tenderness: There is no abdominal tenderness.  Musculoskeletal: Normal range of motion.        General: No swelling.  Skin:    General: Skin is warm and dry.  Neurological:     General: No focal deficit present.     Mental Status: He is alert.  Psychiatric:        Mood and Affect: Mood normal.        Behavior: Behavior normal.        Thought Content: Thought content normal.        Judgment: Judgment normal.      Diagnostic Tests:   ECHOCARDIOGRAM REPORT       Patient Name:   ENDY MAZZAFERRO Date of Exam: 07/01/2018 Medical Rec #:  [id number]    Height:       68.5  in Accession #:    [id number]   Weight:       218.8 lb Date of Birth:  Sep 26, 1965    BSA:          2.13 m Patient Age:    53 years     BP:  114/66 mmHg Patient Gender: M            HR:           69 bpm. Exam Location:  [facility]    Procedure: 2D Echo  Indications:    Nonrheumatic aortic valve stenosis [I35.0]   History:        Patient has no prior history of Echocardiogram examinations.                 Atherosclerotic heart disease, Bicuspid aortic valve;                 Signs/Symptoms: Murmur; Risk Factors: Hypertension and Diabetes.   Sonographer:    Louie BostonJames Reel Referring Phys: Gypsy Balsamobert Krasowski MD  IMPRESSIONS    1. The left ventricle has normal systolic function with an ejection fraction of 60-65%. The cavity size was normal. There is moderately increased left ventricular wall thickness. Left ventricular diastolic Doppler parameters are consistent with impaired  relaxation.  2. The right ventricle has normal systolic function. The cavity was normal. There is no increase in right ventricular wall thickness.  3. Left atrial size was mild-moderately dilated.  4. The aortic valve is abnormal Moderate calcification of the aortic valve. Aortic valve regurgitation is trivial by color flow Doppler. critical stenosis of the aortic valve.  5. There is moderate dilatation of the ascending aorta measuring 41 mm.  FINDINGS  Left Ventricle: The left ventricle has normal systolic function, with an ejection fraction of 60-65%. The cavity size was normal. There is moderately increased left ventricular wall thickness. Left ventricular diastolic Doppler parameters are consistent  with impaired relaxation. Right Ventricle: The right ventricle has normal systolic function. The cavity was normal. There is no increase in right ventricular wall thickness. Left Atrium: left atrial size was mild-moderately dilated Right Atrium: right atrial size was normal in size. Right atrial pressure is  estimated at 8 mmHg. Interatrial Septum: No atrial level shunt detected by color flow Doppler. Pericardium: There is no evidence of pericardial effusion. Mitral Valve: The mitral valve is normal in structure. Mitral valve regurgitation is mild by color flow Doppler. Tricuspid Valve: The tricuspid valve is normal in structure. Tricuspid valve regurgitation was not visualized by color flow Doppler. Aortic Valve: The aortic valve is abnormal Moderate calcification of the aortic valve. Aortic valve regurgitation is trivial by color flow Doppler. There is critical stenosis of the aortic valve, with a calculated valve area of 0.73 cm. Pulmonic Valve: The pulmonic valve was not well visualized. Pulmonic valve regurgitation was not assessed by color flow Doppler. Aorta: There is moderate dilatation of the ascending aorta measuring 41 mm. Venous: The inferior vena cava measures 2.30 cm, is normal in size with greater than 50% respiratory variability.   LEFT VENTRICLE PLAX 2D LVIDd:         5.70 cm  Diastology LVIDs:         4.30 cm  LV e' lateral:   7.62 cm/s LV PW:         1.50 cm  LV E/e' lateral: 9.3 LV IVS:        1.50 cm  LV e' medial:    5.98 cm/s LVOT diam:     2.00 cm  LV E/e' medial:  11.8 LV SV:         77 ml LV SV Index:   34.62 LVOT Area:     3.14 cm  RIGHT VENTRICLE RV S prime:     12.70 cm/s TAPSE (  M-mode): 2.2 cm RVSP:           20.2 mmHg  LEFT ATRIUM             Index       RIGHT ATRIUM           Index LA diam:        4.80 cm 2.25 cm/m  RA Pressure: 8 mmHg LA Vol (A2C):   49.3 ml 23.09 ml/m RA Area:     19.70 cm LA Vol (A4C):   91.1 ml 42.67 ml/m RA Volume:   58.00 ml  27.17 ml/m LA Biplane Vol: 67.7 ml 31.71 ml/m  AORTIC VALVE AV Area (Vmax):    0.70 cm AV Area (Vmean):   0.63 cm AV Area (VTI):     0.73 cm AV Vmax:           500.00 cm/s AV Vmean:          381.000 cm/s AV VTI:            1.280 m AV Peak Grad:      100.0 mmHg AV Mean Grad:      64.0  mmHg LVOT Vmax:         111.00 cm/s LVOT Vmean:        76.600 cm/s LVOT VTI:          0.296 m LVOT/AV VTI ratio: 0.23   AORTA Ao Root diam: 3.60 cm Ao Asc diam:  4.15 cm  MITRAL VALVE              TRICUSPID VALVE MV Area (PHT): 2.95 cm   TR Peak grad:   12.2 mmHg MV PHT:        74.53 msec TR Vmax:        175.00 cm/s MV Decel Time: 257 msec   RVSP:           20.2 mmHg MV E velocity: 70.80 cm/s MV A velocity: 64.00 cm/s SHUNTS MV E/A ratio:  1.11       Systemic VTI:  0.30 m                           Systemic Diam: 2.00 cm  IVC IVC diam: 2.30 cm    Belva Crome MD Electronically signed by Belva Crome MD Signature Date/Time: 07/01/2018/12:13:59 PM     Physicians   Panel Physicians Referring Physician Case Authorizing Physician  Lennette Bihari, MD (Primary)    Procedures   RIGHT HEART CATH AND CORONARY ANGIOGRAPHY  Conclusion     Prox LAD lesion is 5% stenosed.  There is trivial (1+) aortic regurgitation.   Mild coronary calcification without significant coronary obstructive disease with a normal left main, minimal luminal narrowing of 5% in the LAD; normal dominant left circumflex coronary artery, and normal nondominant RCA.  Mildly increased right heart pressures.  Dilated aortic root.  Severely calcified previously diagnosed bicuspid aortic valve with markedly reduced excursion.  There is trace to mild aortic insufficiency.  The aortic valve was not able to be crossed.  Echocardiographic gradient assessment revealed a mean gradient of 64, peak instantaneous gradient of 100 mmHg and a calculated valve area of 0.6 to 0.7 cm consistent with critical aortic stenosis.   Echocardiographic documentation of normal LV function with an EF of 60 to 65%.  RECOMMENDATION: The patient is followed by Dr. Bing Matter.  Surgical consultation will be necessary.  In this 53 year old patient  with bicuspid valve, probable SAVR rather than TAVR will be necessary.     Recommendations   Antiplatelet/Anticoag Recommend Aspirin  daily for moderate CAD.  Indications   Critical aortic valve stenosis [I35.0 (ICD-10-CM)]  Procedural Details   Technical Details Mr. Hulbert Branscome is a 53 year old gentleman who has a history of a bicuspid aortic valve.  Recent echo Doppler study has revealed critical aortic stenosis with a mean gradient of 64 and peak gradient of 100.  He is referred by Dr. Bing Matter for right and left heart cardiac catheterization.  The patient arrived to the catheterization laboratory in the fasting state.  An IV was already in place in his left antecubital vein.  He was given Versed 1 mg and fentanyl 25 mcg for conscious sedation.  His left antecubital vein sheath was exchanged for a 5 [place] glide sheath slender sheath.  Right heart catheterization was performed and pressures were obtained in the RA, RV, PA, and PW positions.  Oxygen saturation was obtained in the pulmonary artery.  Attention was then directed at the right radial approach for his left heart catheterization.  The right radial artery was punctured anteriorly and a 6 French glide sheath slender sheath was inserted.  Verapamil 3 mg was administered.  5000 units of weight adjusted heparin was administered. There was significant looping entering the ascending aorta due to a dilated aortic vessel.  There was significant difficulty cannulating his coronary arteries.  Ultimately the right coronary artery was successfully cannulated with a 5 [place] TIG catheter.  Multiple catheters were used to attempt cannulation of the left main including a JL 3.5, JL4, EZ rad left, EBU4.0, and Jacky catheter.  There was only one adequate and ejection with other suboptimal injections.  As result the procedure was transitioned to the femoral approach.  The right femoral artery was punctured anteriorly with one stick and a 5 French sheath was inserted.  A JL 5 catheter was inserted with immediate cannulation into  the left main.  Avril injections of the left coronary system were obtained.  A pigtail catheter was then inserted and supravalvular aortography was performed.  An attempt was made prior to cross the aortic valve with the pigtail catheter and AL-1 catheters but after 3 attempts the decision was made to abort this in light of the significant echocardiographic findings to reduce potential Bolick risk.  TR band was applied to the right radial artery.  Swan-Ganz catheter was removed.  All catheters were removed with hemostasis obtained by manual pressure to the femoral artery and left antecubital fossa.  The patient tolerated procedure well and returned to his room in satisfactory condition. Estimated blood loss <50 mL.   During this procedure medications were administered to achieve and maintain moderate conscious sedation while the patient's heart rate, blood pressure, and oxygen saturation were continuously monitored and I was present face-to-face 100% of this time.   Coronary Findings   Diagnostic  Dominance: Left  Left Anterior Descending  Prox LAD lesion 5% stenosed  Prox LAD lesion is 5% stenosed.  Intervention   No interventions have been documented.  Right Heart   Right Heart Pressures RA: A-wave 13 V wave 7; mean 6 RV: 39/11 PA: 32/13; mean 20 PW: A-wave 19 V wave 16; mean 13  Central aortic pressure 121/60  Oxygen saturation in the pulmonary artery 70% and in the central aorta 98%.  By the [facility] method cardiac output 5.4 L/min and cardiac index 2.5 L/min/m.  SVR: 14.2 PVR 1.9 WU  Impression:  This 53 year old gentleman has a bicuspid aortic valve with stage D, critical, symptomatic aortic stenosis with New York Heart Association class III symptoms of exertional fatigue and shortness of breath as well as substernal chest discomfort consistent with chronic diastolic congestive heart failure.  I have personally reviewed his 2D echocardiogram and cardiac catheterization studies.   His 2D echocardiogram shows a bicuspid aortic valve with severe calcification and thickening of the valve leaflets with restricted mobility.  The mean gradient across aortic valve was 64 mmHg consistent with critical aortic stenosis.  Left ventricular systolic function is normal.  The ascending aorta is enlarged to 4.1 to 4.2 cm.  Cardiac catheterization shows no significant coronary disease.  Aortic root injection does show some enlargement of the ascending aorta.  Aortic valve replacement is clearly indicated in this patient for relief of his symptoms and to prevent progressive left ventricular deterioration and death.  He does have some enlargement of the ascending aorta and bicuspid valve so he will require a CTA of the chest preoperatively to more accurately assess the thoracic aorta to decide if the ascending aorta requires replacement.  He is also had some problems with his teeth and has some loose teeth at this time.  I discussed the importance of good dental hygiene and preoperative dental evaluation to be sure there are no active problems that need to be addressed preoperatively.  He is going to try to make an appointment to see his dentist as soon as possible for reevaluation.  If he cannot get in to see his dentist then we will try to make an appointment with Dr. Kristin Bruins here.  I discussed the option of bioprosthetic and mechanical aortic valve prostheses and the benefits and risk of both.  I think a bioprosthetic valve would be the best option for him given his chronic arthritis with nonsteroidal anti-inflammatory use and alcohol intake.  A bioprosthetic valve would have good durability in his age group and avoid the need for Coumadin with his associated risk.  He and his wife are in agreement with that.  I also discussed the possible need for replacement of the aortic root and ascending aorta depending on the results of his CT scan and reviewed the surgical procedure with them.  All their questions  have been answered.  Plan:  We will schedule a CTA of the chest as soon as possible.  The patient would like to have it done at [facility] if possible since it is closer to home.  He is going to call his dentist to try to get a follow-up appointment as quick as possible for follow-up evaluation and will let us know the results of that.  If he cannot get in with his dentist and we will have him see Dr. Kristin Bruins.  Pending the results of that evaluation and the results of his CT scan we will schedule aortic valve replacement or Bentall procedure as soon as possible.  I spent 60 minutes performing this consultation and > 50% of this time was spent face to face counseling and coordinating the care of this patient's bicuspid aortic valve stenosis with ascending aortic aneurysm.  [name], MD [facility] [phone]

## 2018-08-31 DIAGNOSIS — I350 Nonrheumatic aortic (valve) stenosis: Secondary | ICD-10-CM | POA: Diagnosis not present

## 2018-08-31 DIAGNOSIS — I712 Thoracic aortic aneurysm, without rupture: Secondary | ICD-10-CM | POA: Diagnosis not present

## 2018-08-31 DIAGNOSIS — Z01818 Encounter for other preprocedural examination: Secondary | ICD-10-CM | POA: Diagnosis not present

## 2018-09-01 ENCOUNTER — Encounter: Payer: Self-pay | Admitting: *Deleted

## 2018-09-01 ENCOUNTER — Other Ambulatory Visit: Payer: Self-pay | Admitting: *Deleted

## 2018-09-01 DIAGNOSIS — I350 Nonrheumatic aortic (valve) stenosis: Secondary | ICD-10-CM

## 2018-09-01 DIAGNOSIS — I712 Thoracic aortic aneurysm, without rupture, unspecified: Secondary | ICD-10-CM

## 2018-09-04 NOTE — Pre-Procedure Instructions (Signed)
Gwynneth Albrighterry W Milliner  09/04/2018      [facility] - [place] - [address] [place] [id number] Phone: [phone] Fax: [phone]    Your procedure is scheduled on 09/10/18.  [facility] at 530 A.M.  Call this number if you have problems the morning of surgery:  [phone]   Remember:  Do not eat or drink after midnight.  Y   Take these medicines the morning of surgery with A SIP OF WATER --nexium,metoprolol    Do not wear jewelry, make-up or nail polish.  Do not wear lotions, powders, or perfumes, or deodorant.  Do not shave 48 hours prior to surgery.  Men may shave face and neck.  Do not bring valuables to the hospital.  [facility] is not responsible for any belongings or valuables.  Contacts, dentures or bridgework may not be worn into surgery.  Leave your suitcase in the car.  After surgery it may be brought to your room.  For patients admitted to the hospital, discharge time will be determined by your treatment team.  Patients discharged the day of surgery will not be allowed to drive home.   Name and phone number of your driver:    Special instructions:  Do not take any aspirin,anti-inflammatories,vitamins,or herbal supplements 5-7 days prior to surgery.[place] - Preparing for Surgery  Before surgery, you can play an important role.  Because skin is not sterile, your skin needs to be as free of germs as possible.  You can reduce the number of germs on you skin by washing with CHG (chlorahexidine gluconate) soap before surgery.  CHG is an antiseptic cleaner which kills germs and bonds with the skin to continue killing germs even after washing.  Oral Hygiene is also important in reducing the risk of infection.  Remember to brush your teeth with your regular toothpaste the morning of surgery.  Please DO NOT use if you have an allergy to CHG or antibacterial soaps.  If your skin becomes  reddened/irritated stop using the CHG and inform your nurse when you arrive at Short Stay.  Do not shave (including legs and underarms) for at least 48 hours prior to the first CHG shower.  You may shave your face.  Please follow these instructions carefully:   1.  Shower with CHG Soap the night before surgery and the morning of Surgery.  2.  If you choose to wash your hair, wash your hair first as usual with your normal shampoo.  3.  After you shampoo, rinse your hair and body thoroughly to remove the shampoo. 4.  Use CHG as you would any other liquid soap.  You can apply chg directly to the skin and wash gently with a      scrungie or washcloth.           5.  Apply the CHG Soap to your body ONLY FROM THE NECK DOWN.   Do not use on open wounds or open sores. Avoid contact with your eyes, ears, mouth and genitals (private parts).  Wash genitals (private parts) with your normal soap.  6.  Wash thoroughly, paying special attention to the area where your surgery will be performed.  7.  Thoroughly rinse your body with warm water from the neck down.  8.  DO NOT shower/wash with your normal soap after using and rinsing off the CHG Soap.  9.  Pat yourself  dry with a clean towel.            10.  Wear clean pajamas.            11.  Place clean sheets on your bed the night of your first shower and do not sleep with pets.  Day of Surgery  Do not apply any lotions/deoderants the morning of surgery.   Please wear clean clothes to the hospital/surgery center. Remember to brush your teeth with toothpaste.    Please read over the following fact sheets that you were given. MRSA Information

## 2018-09-07 ENCOUNTER — Encounter (HOSPITAL_COMMUNITY): Payer: Self-pay

## 2018-09-07 ENCOUNTER — Encounter (HOSPITAL_COMMUNITY)
Admission: RE | Admit: 2018-09-07 | Discharge: 2018-09-07 | Disposition: A | Discharge status: Home / Self Care | Payer: Commercial Managed Care - PPO | Source: Ambulatory Visit | Attending: Surgery | Admitting: Surgery

## 2018-09-07 ENCOUNTER — Other Ambulatory Visit (HOSPITAL_COMMUNITY)
Admission: RE | Admit: 2018-09-07 | Discharge: 2018-09-07 | Disposition: A | Discharge status: Home / Self Care | Payer: Commercial Managed Care - PPO | Source: Ambulatory Visit | Attending: Surgery | Admitting: Surgery

## 2018-09-07 ENCOUNTER — Ambulatory Visit (HOSPITAL_COMMUNITY)
Admission: RE | Admit: 2018-09-07 | Discharge: 2018-09-07 | Disposition: A | Discharge status: Home / Self Care | Payer: Commercial Managed Care - PPO | Source: Ambulatory Visit | Attending: Surgery | Admitting: Surgery

## 2018-09-07 ENCOUNTER — Other Ambulatory Visit (HOSPITAL_COMMUNITY): Payer: Commercial Managed Care - PPO

## 2018-09-07 ENCOUNTER — Other Ambulatory Visit: Payer: Self-pay

## 2018-09-07 ENCOUNTER — Ambulatory Visit (HOSPITAL_BASED_OUTPATIENT_CLINIC_OR_DEPARTMENT_OTHER)
Admission: RE | Admit: 2018-09-07 | Discharge: 2018-09-07 | Disposition: A | Discharge status: Home / Self Care | Payer: Commercial Managed Care - PPO | Source: Ambulatory Visit | Attending: Surgery | Admitting: Surgery

## 2018-09-07 DIAGNOSIS — I712 Thoracic aortic aneurysm, without rupture, unspecified: Secondary | ICD-10-CM

## 2018-09-07 DIAGNOSIS — I350 Nonrheumatic aortic (valve) stenosis: Secondary | ICD-10-CM

## 2018-09-07 DIAGNOSIS — Z1159 Encounter for screening for other viral diseases: Secondary | ICD-10-CM | POA: Insufficient documentation

## 2018-09-07 HISTORY — DX: Nonrheumatic aortic (valve) stenosis: I35.0

## 2018-09-07 HISTORY — DX: Aneurysm of the ascending aorta, without rupture: I71.21

## 2018-09-07 HISTORY — DX: Thoracic aortic aneurysm, without rupture: I71.2

## 2018-09-07 HISTORY — DX: Dyspnea, unspecified: R06.00

## 2018-09-07 LAB — URINALYSIS, ROUTINE W REFLEX MICROSCOPIC
Bilirubin Urine: NEGATIVE
Glucose, UA: 150 mg/dL — AB
Hgb urine dipstick: NEGATIVE
Ketones, ur: NEGATIVE mg/dL
Leukocytes,Ua: NEGATIVE
Nitrite: NEGATIVE
Protein, ur: NEGATIVE mg/dL
Specific Gravity, Urine: 1.009 (ref 1.005–1.030)
pH: 5 (ref 5.0–8.0)

## 2018-09-07 LAB — CBC
HCT: 40.1 % (ref 39.0–52.0)
Hemoglobin: 13.7 g/dL (ref 13.0–17.0)
MCH: 32.1 pg (ref 26.0–34.0)
MCHC: 34.2 g/dL (ref 30.0–36.0)
MCV: 93.9 fL (ref 80.0–100.0)
Platelets: 186 10*3/uL (ref 150–400)
RBC: 4.27 MIL/uL (ref 4.22–5.81)
RDW: 12 % (ref 11.5–15.5)
WBC: 6.5 10*3/uL (ref 4.0–10.5)
nRBC: 0 % (ref 0.0–0.2)

## 2018-09-07 LAB — COMPREHENSIVE METABOLIC PANEL
ALT: 34 U/L (ref 0–44)
AST: 33 U/L (ref 15–41)
Albumin: 4.1 g/dL (ref 3.5–5.0)
Alkaline Phosphatase: 60 U/L (ref 38–126)
Anion gap: 14 (ref 5–15)
BUN: 7 mg/dL (ref 6–20)
CO2: 19 mmol/L — ABNORMAL LOW (ref 22–32)
Calcium: 9.3 mg/dL (ref 8.9–10.3)
Chloride: 101 mmol/L (ref 98–111)
Creatinine, Ser: 0.76 mg/dL (ref 0.61–1.24)
GFR calc Af Amer: >60 mL/min (ref >60)
GFR calc non Af Amer: >60 mL/min (ref >60)
Glucose, Bld: 209 mg/dL — ABNORMAL HIGH (ref 70–99)
Potassium: 4.2 mmol/L (ref 3.5–5.1)
Sodium: 134 mmol/L — ABNORMAL LOW (ref 135–145)
Total Bilirubin: 0.6 mg/dL (ref 0.3–1.2)
Total Protein: 7.1 g/dL (ref 6.5–8.1)

## 2018-09-07 LAB — HEMOGLOBIN A1C
Hgb A1c MFr Bld: 7.4 % — ABNORMAL HIGH (ref 4.8–5.6)
Mean Plasma Glucose: 165.68 mg/dL

## 2018-09-07 LAB — BLOOD GAS, ARTERIAL
Acid-base deficit: 0.3 mmol/L (ref 0.0–2.0)
Bicarbonate: 23.8 mmol/L (ref 20.0–28.0)
Drawn by: 42180
FIO2: 0.21
O2 Saturation: 97 %
Patient temperature: 98.6
pCO2 arterial: 39.3 mmHg (ref 32.0–48.0)
pH, Arterial: 7.4 (ref 7.350–7.450)
pO2, Arterial: 91.8 mmHg (ref 83.0–108.0)

## 2018-09-07 LAB — APTT: aPTT: 28 seconds (ref 24–36)

## 2018-09-07 LAB — ABO/RH: ABO/RH(D): B POS

## 2018-09-07 LAB — SURGICAL PCR SCREEN
MRSA, PCR: NEGATIVE
Staphylococcus aureus: NEGATIVE

## 2018-09-07 LAB — PROTIME-INR
INR: 1 (ref 0.8–1.2)
Prothrombin Time: 13 seconds (ref 11.4–15.2)

## 2018-09-07 LAB — GLUCOSE, CAPILLARY: Glucose-Capillary: 178 mg/dL — ABNORMAL HIGH (ref 70–99)

## 2018-09-07 NOTE — Progress Notes (Signed)
PCP -  Cardiologist - Dr Eustaquio Maize    [facility]  Chest x-ray - 5/20 EKG - 5/20 Stress Test - 3/20 ECHO - 5/20 Cardiac Cath - 3/20   Fasting Blood Sugar -  Checks Blood Sugar _2____ times a day  BlooAspirin Instructions:stop  Anesthesia review: severe aortic stenosis       Patient denies shortness of breath, fever, cough and chest pain at PAT appointment   Patient verbalized understanding of instructions that were given to them at the PAT appointment. Patient was also instructed that they will need to review over the PAT instructions again at home before surgery.

## 2018-09-07 NOTE — Progress Notes (Signed)
Pre surgery eval. Vascular ultrasound testing       has been completed. Preliminary results can be found under CV proc through chart review. Jeb Levering, BS, RDMS, RVT

## 2018-09-08 ENCOUNTER — Encounter (HOSPITAL_COMMUNITY): Payer: Self-pay

## 2018-09-08 LAB — NOVEL CORONAVIRUS, NAA (HOSP ORDER, SEND-OUT TO REF LAB; TAT 18-24 HRS): SARS-CoV-2, NAA: NOT DETECTED

## 2018-09-08 NOTE — Progress Notes (Signed)
Anesthesia Chart Review:  Case:  [id number] Date/Time:  09/10/18 0716   Procedures:      BENTALL PROCEDURE (N/A Chest)     TRANSESOPHAGEAL ECHOCARDIOGRAM (TEE) (N/A )   Anesthesia type:  General   Pre-op diagnosis:      SEVERE AS     TAA   Location:  [facility]OM 14 / [facility]   Surgeon:  Alleen BorneBartle, Bryan K, MD      DISCUSSION: Patient is a 53 year old male scheduled for the above procedure.  History includes never smoker, DM2, HLD, HTN, dyspnea, murmur/severe aortic stenosis, ascending TAA, GERD. He has a history of heavy alcohol intake (~ 6 beers/day). BMI is consistent with obesity.  Presurgical COVID test on 09/17/18 was negative. If no acute changes then I would anticipate that he can proceed as planned.   VS: BP 135/72   Pulse 71   Temp 36.9 C   Resp 20   Ht 5' 8.5" (1.74 m)   Wt 100.7 kg   SpO2 99%   BMI 33.25 kg/m   PROVIDERS: Abigail MiyamotoPerry, Lawrence Edward, MD is listed as PCP Gypsy BalsamKrasowski, Robert, MD is cardiologist   LABS: Labs reviewed: Acceptable for surgery. (all labs ordered are listed, but only abnormal results are displayed)  Labs Reviewed  GLUCOSE, CAPILLARY - Abnormal; Notable for the following components:      Result Value   Glucose-Capillary 178 (*)    All other components within normal limits  COMPREHENSIVE METABOLIC PANEL - Abnormal; Notable for the following components:   Sodium 134 (*)    CO2 19 (*)    Glucose, Bld 209 (*)    All other components within normal limits  HEMOGLOBIN A1C - Abnormal; Notable for the following components:   Hgb A1c MFr Bld 7.4 (*)    All other components within normal limits  URINALYSIS, ROUTINE W REFLEX MICROSCOPIC - Abnormal; Notable for the following components:   Color, Urine STRAW (*)    Glucose, UA 150 (*)    All other components within normal limits  SURGICAL PCR SCREEN  APTT  BLOOD GAS, ARTERIAL  CBC  PROTIME-INR  TYPE AND SCREEN  ABO/RH    IMAGES: CXR 09/07/18: IMPRESSION: No active cardiopulmonary  disease.  CTA Chest 08/31/18 (Report in PACS): Impression: 1.  Ascending thoracic aortic prominence with a sending thoracic aorta measuring 4.2 x 4.2 cm.  Transverse diameter at the aortic arch measures 3.4 cm.  Recommend annual imaging follow-up by CTA or MRA. 2.  No demonstratable pulmonary embolism. 3.  No parenchymal lung edema or consolidation.  Slight atelectatic change noted. 4.  No thoracic adenopathy. 5.  Note that the right subclavian artery arises from the thoracic aorta distal to the left subclavian artery.  It passes posterior to the esophagus in route to the right side. 6.  Hepatic steatosis.  Gallbladder absent.   EKG: 09/07/18: Normal sinus rhythm Left ventricular hypertrophy with repolarization abnormality Abnormal ECG No old tracing to compare Confirmed by [place]Jordan, Peter [phone]([id number]) on 09/07/2018 4:14:11 PM   CV: Carotid US 09/07/18: Summary: Right Carotid: Velocities in the right ICA are consistent with a 1-39% stenosis. Left Carotid: Velocities in the left ICA are consistent with a 1-39% stenosis.  Cardiac cath 07/07/18:  Prox LAD lesion is 5% stenosed.  There is trivial (1+) aortic regurgitation. - Mild coronary calcification without significant coronary obstructive disease with a normal left main, minimal luminal narrowing of 5% in the LAD; normal dominant left circumflex coronary artery, and normal  nondominant RCA. - Mildly increased right heart pressures. - Dilated aortic root. - Severely calcified previously diagnosed bicuspid aortic valve with markedly reduced excursion.  There is trace to mild aortic insufficiency.  The aortic valve was not able to be crossed.  Echocardiographic gradient assessment revealed a mean gradient of 64, peak instantaneous gradient of 100 mmHg and a calculated valve area of 0.6 to 0.7 cm consistent with critical aortic stenosis.  - Echocardiographic documentation of normal LV function with an EF of 60 to 65%. RECOMMENDATION: The  patient is followed by Dr. Bing Matter.  Surgical consultation will be necessary.  In this 53 year old patient with bicuspid valve, probable SAVR rather than TAVR will be necessary.   Echo 07/01/18: IMPRESSIONS  1. The left ventricle has normal systolic function with an ejection fraction of 60-65%. The cavity size was normal. There is moderately increased left ventricular wall thickness. Left ventricular diastolic Doppler parameters are consistent with impaired  relaxation.  2. The right ventricle has normal systolic function. The cavity was normal. There is no increase in right ventricular wall thickness.  3. Left atrial size was mild-moderately dilated.  4. The aortic valve is abnormal Moderate calcification of the aortic valve. Aortic valve regurgitation is trivial by color flow Doppler. critical stenosis of the aortic valve. AV Area (Vmax):    0.70 cm AV Area (Vmean):   0.63 cm AV Area (VTI):     0.73 cm AV Vmax:           500.00 cm/s AV Vmean:          381.000 cm/s AV VTI:            1.280 m AV Peak Grad:      100.0 mmHg AV Mean Grad:      64.0 mmHg LVOT Vmax:         111.00 cm/s LVOT Vmean:        76.600 cm/s LVOT VTI:          0.296 m LVOT/AV VTI ratio: 0.23  5. There is moderate dilatation of the ascending aorta measuring 41 mm.   Past Medical History:  Diagnosis Date  . Alcoholism (HCC) 06/24/2018  . Aortic stenosis   . Diabetes mellitus type 2, controlled (HCC) 06/24/2018  . Dyspnea   . Essential hypertension 06/24/2018  . GERD (gastroesophageal reflux disease) 06/24/2018  . Heart murmur 06/24/2018  . Hyperlipidemia 06/24/2018  . Thoracic ascending aortic aneurysm [facility])     Past Surgical History:  Procedure Laterality Date  . CHOLECYSTECTOMY    . RIGHT HEART CATH AND CORONARY ANGIOGRAPHY N/A 07/07/2018   Procedure: RIGHT HEART CATH AND CORONARY ANGIOGRAPHY;  Surgeon: Lennette Bihari, MD;  Location: [facility] INV[facility];  Service: Cardiovascular;  Laterality: N/A;     MEDICATIONS: . aspirin EC 81 MG tablet  . esomeprazole (NEXIUM) 20 MG capsule  . ibuprofen (ADVIL,MOTRIN) 800 MG tablet  . lisinopril (PRINIVIL,ZESTRIL) 10 MG tablet  . metFORMIN (GLUCOPHAGE) 500 MG tablet  . metoprolol succinate (TOPROL-XL) 50 MG 24 hr tablet  . nitroGLYCERIN (NITROSTAT) 0.4 MG SL tablet  . Omega-3 Fatty Acids (FISH OIL) 1000 MG CAPS  . simvastatin (ZOCOR) 40 MG tablet   No current facility-administered medications for this encounter.     Shonna Chock, PA-C Surgical Short Stay/Anesthesiology [facility] Phone [phone] [facility] Phone [phone] 09/08/2018 2:27 PM

## 2018-09-08 NOTE — Anesthesia Preprocedure Evaluation (Addendum)
Anesthesia Evaluation  Patient identified by MRN, date of birth, ID band Patient awake    Reviewed: Allergy & Precautions, NPO status , Patient's Chart, lab work & pertinent test results, reviewed documented beta blocker date and time   History of Anesthesia Complications Negative for: history of anesthetic complications  Airway Mallampati: II  TM Distance: >3 FB Neck ROM: Full    Dental  (+) Dental Advisory Given, Poor Dentition, Missing   Pulmonary shortness of breath, sleep apnea ,    Pulmonary exam normal breath sounds clear to auscultation       Cardiovascular hypertension, Pt. on medications and Pt. on home beta blockers + CAD  + Valvular Problems/Murmurs AS  Rhythm:Regular Rate:Normal + Systolic murmurs  '20 Carotid US - 1-39% b/l ICAS  '20 Cath - Mild coronary calcification without significant coronary obstructive disease with a normal left main, minimal luminal narrowing of 5% in the LAD. Mildly increased right heart pressures. Dilated aortic root. Severely calcified previously diagnosed bicuspid aortic valve with markedly reduced excursion.  There is trace to mild aortic insufficiency. Echocardiographic gradient assessment revealed a mean gradient of 64, peak instantaneous gradient of 100 mmHg and a calculated valve area of 0.6 to 0.7 cm consistent with critical aortic stenosis.  Echocardiographic EF of 60 to 65%.  '20 TTE - EF 60-65%. Moderately increased LV wall thickness. Left ventricular diastolic Doppler parameters are consistent with impaired relaxation. LA was mild-moderately dilated. Mild MR. Trivial AI, critical AS. Calculated valve area of 0.73 cm. Moderate dilatation of the ascending aorta measuring 41 mm.    Neuro/Psych negative neurological ROS  negative psych ROS   GI/Hepatic GERD  Medicated,(+)     substance abuse  alcohol use,   Endo/Other  diabetes, Type 2, Oral Hypoglycemic Agents Obesity    Renal/GU negative Renal ROS     Musculoskeletal negative musculoskeletal ROS (+)   Abdominal   Peds  Hematology negative hematology ROS (+)   Anesthesia Other Findings   Reproductive/Obstetrics                           Anesthesia Physical Anesthesia Plan  ASA: IV  Anesthesia Plan: General   Post-op Pain Management:    Induction: Intravenous  PONV Risk Score and Plan: 2 and Midazolam, Treatment may vary due to age or medical condition and Dexamethasone  Airway Management Planned: Oral ETT  Additional Equipment: Arterial line, CVP, PA Cath, TEE and Ultrasound Guidance Line Placement  Intra-op Plan: Utilization Of Total Body Hypothermia per surgeon request and Delibrate Circulatory arrest per surgeon request  Post-operative Plan: Post-operative intubation/ventilation  Informed Consent: I have reviewed the patients History and Physical, chart, labs and discussed the procedure including the risks, benefits and alternatives for the proposed anesthesia with the patient or authorized representative who has indicated his/her understanding and acceptance.     Dental advisory given  Plan Discussed with: CRNA and Anesthesiologist  Anesthesia Plan Comments:      Anesthesia Quick Evaluation

## 2018-09-09 MED ORDER — NITROGLYCERIN IN D5W 200-5 MCG/ML-% IV SOLN
2.0000 ug/min | INTRAVENOUS | Status: AC
  Administered 2018-09-10: 25 ug/min via INTRAVENOUS
  Filled 2018-09-09: qty 250

## 2018-09-09 MED ORDER — SODIUM CHLORIDE 0.9 % IV SOLN
750.0000 mg | INTRAVENOUS | Status: DC
  Filled 2018-09-09: qty 750

## 2018-09-09 MED ORDER — PHENYLEPHRINE HCL-NACL 20-0.9 MG/250ML-% IV SOLN
30.0000 ug/min | INTRAVENOUS | Status: DC
  Filled 2018-09-09: qty 250

## 2018-09-09 MED ORDER — PLASMA-LYTE 148 IV SOLN
INTRAVENOUS | Status: DC
  Filled 2018-09-09: qty 2.5

## 2018-09-09 MED ORDER — TRANEXAMIC ACID (OHS) BOLUS VIA INFUSION
15.0000 mg/kg | INTRAVENOUS | Status: AC
  Administered 2018-09-10: 1510.5 mg via INTRAVENOUS
  Filled 2018-09-09: qty 1511

## 2018-09-09 MED ORDER — SODIUM CHLORIDE 0.9 % IV SOLN
1.5000 g | INTRAVENOUS | Status: AC
  Administered 2018-09-10: 1.5 g via INTRAVENOUS
  Filled 2018-09-09: qty 1.5

## 2018-09-09 MED ORDER — EPINEPHRINE PF 1 MG/ML IJ SOLN
0.0000 ug/min | INTRAVENOUS | Status: DC
  Filled 2018-09-09: qty 4

## 2018-09-09 MED ORDER — TRANEXAMIC ACID (OHS) PUMP PRIME SOLUTION
2.0000 mg/kg | INTRAVENOUS | Status: DC
  Filled 2018-09-09: qty 2.01

## 2018-09-09 MED ORDER — MAGNESIUM SULFATE 50 % IJ SOLN
40.0000 meq | INTRAMUSCULAR | Status: DC
  Filled 2018-09-09: qty 9.85

## 2018-09-09 MED ORDER — POTASSIUM CHLORIDE 2 MEQ/ML IV SOLN
80.0000 meq | INTRAVENOUS | Status: DC
  Filled 2018-09-09: qty 40

## 2018-09-09 MED ORDER — DEXMEDETOMIDINE HCL IN NACL 400 MCG/100ML IV SOLN
0.1000 ug/kg/h | INTRAVENOUS | Status: AC
  Administered 2018-09-10: .3 ug/kg/h via INTRAVENOUS
  Filled 2018-09-09: qty 100

## 2018-09-09 MED ORDER — VANCOMYCIN HCL 10 G IV SOLR
1500.0000 mg | INTRAVENOUS | Status: AC
  Administered 2018-09-10: 1500 mg via INTRAVENOUS
  Filled 2018-09-09: qty 1500

## 2018-09-09 MED ORDER — INSULIN REGULAR(HUMAN) IN NACL 100-0.9 UT/100ML-% IV SOLN
INTRAVENOUS | Status: AC
  Administered 2018-09-10: 09:00:00 1.5 [IU]/h via INTRAVENOUS
  Filled 2018-09-09: qty 100

## 2018-09-09 MED ORDER — SODIUM CHLORIDE 0.9 % IV SOLN
INTRAVENOUS | Status: DC
  Filled 2018-09-09: qty 30

## 2018-09-09 MED ORDER — DOPAMINE-DEXTROSE 3.2-5 MG/ML-% IV SOLN
0.0000 ug/kg/min | INTRAVENOUS | Status: DC
  Filled 2018-09-09: qty 250

## 2018-09-09 MED ORDER — TRANEXAMIC ACID 1000 MG/10ML IV SOLN
1.5000 mg/kg/h | INTRAVENOUS | Status: AC
  Administered 2018-09-10: 09:00:00 1.5 mg/kg/h via INTRAVENOUS
  Filled 2018-09-09: qty 25

## 2018-09-09 MED ORDER — MILRINONE LACTATE IN DEXTROSE 20-5 MG/100ML-% IV SOLN
0.3000 ug/kg/min | INTRAVENOUS | Status: DC
  Filled 2018-09-09: qty 100

## 2018-09-09 NOTE — H&P (Addendum)
[address]       Ronnie Ruiz [address]             [phone]      Cardiothoracic Surgery Admission History and Physical   PCP is Ronnie Ruiz, Ronnie Mallory, MD Referring Provider is Ronnie Lea, MD      Chief Complaint  Patient presents with   Aortic Stenosis        HPI:  The patient is a 53 year old gentleman with history of type 2 diabetes, hypertension, hyperlipidemia, and known bicuspid aortic valve disease which according the patient was diagnosed about 10 or 12 years ago.  He was seen at that time by Dr. Bing Ruiz but then got lost to follow-up.  He returned to see his PCP a couple months ago and was having some new symptoms and was referred back to Dr. Bing Ruiz.  A 2D echocardiogram on 07/01/2018 showed a mean aortic valve gradient of 64 mmHg with a peak gradient of 100 mmHg.  The ascending aorta was enlarged at 4.15 cm.  Left ventricular ejection fraction was 60 to 65%.  Right and left heart catheterization on 07/07/2018 by Dr. Tresa Ruiz showed no significant coronary disease.  The aortic valve could not be crossed.  Right heart pressures were normal with a PA pressure of 32/13 and a cardiac index of 2.5.  The patient is here today with his wife.  He works as a Merchandiser, retail at [employer].  He reports a several month history of exertional fatigue and shortness of breath as well as some substernal chest discomfort occurring with mild exertion.  He has had no dizziness or syncope.  He denies orthopnea and PND.  Is had no peripheral edema.  Despite this he is continued to play golf although uses a golf cart and does not chase any balls down hills.       Past Medical History:  Diagnosis Date   Alcoholism (HCC) 06/24/2018   Diabetes mellitus type 2, controlled (HCC) 06/24/2018   Essential hypertension 06/24/2018   GERD (gastroesophageal reflux disease) 06/24/2018   Heart murmur 06/24/2018   Hyperlipidemia 06/24/2018         Past Surgical  History:  Procedure Laterality Date   CHOLECYSTECTOMY     RIGHT HEART CATH AND CORONARY ANGIOGRAPHY N/A 07/07/2018   Procedure: RIGHT HEART CATH AND CORONARY ANGIOGRAPHY;  Surgeon: Ronnie Bihari, MD;  Location: [facility];  Service: Cardiovascular;  Laterality: N/A;         Family History  Problem Relation Age of Onset   Lung cancer Mother    Pulmonary disease Mother    Alcoholism Father    Anxiety disorder Father    Hyperlipidemia Father    Lung cancer Father    CAD Father    Renal cancer Father    Pulmonary disease Father     Social History Social History        Tobacco Use   Smoking status: Never Smoker   Smokeless tobacco: Current User    Types: Snuff  Substance Use Topics   Alcohol use: Yes   Drug use: Never          Current Outpatient Medications  Medication Sig Dispense Refill   aspirin EC 81 MG tablet Take 81 mg by mouth daily.     esomeprazole (NEXIUM) 20 MG packet Take 20 mg by mouth daily before breakfast.     ibuprofen (ADVIL,MOTRIN) 800 MG tablet Take 800 mg by mouth every  8 (eight) hours as needed.     lisinopril (PRINIVIL,ZESTRIL) 10 MG tablet Take 0.5 tablets (5 mg total) by mouth daily. 90 tablet 0   metFORMIN (GLUCOPHAGE) 500 MG tablet Take 500 mg by mouth 2 (two) times daily with a meal.     metoprolol succinate (TOPROL-XL) 50 MG 24 hr tablet Take 50 mg by mouth daily. Take with or immediately following a meal.     nitroGLYCERIN (NITROSTAT) 0.4 MG SL tablet Place 0.4 mg under the tongue every 5 (five) minutes as needed for chest pain.     Omega-3 Fatty Acids (FISH OIL) 1000 MG CAPS Take 1,000 mg by mouth 2 (two) times daily.      simvastatin (ZOCOR) 40 MG tablet Take 40 mg by mouth daily.     No current facility-administered medications for this visit.     No Known Allergies  Review of Systems  Constitutional: Positive for fatigue.  HENT: Positive for dental problem.         He had several teeth pulled last year and said that he has some other teeth that are loose which he was planning on having pulled this year.  Eyes: Negative.   Respiratory: Positive for chest tightness and shortness of breath.   Cardiovascular: Positive for chest pain. Negative for palpitations and leg swelling.  Gastrointestinal: Negative.   Endocrine: Negative.   Genitourinary: Negative.   Musculoskeletal: Positive for arthralgias.  Skin: Negative.   Allergic/Immunologic: Negative.   Neurological: Negative for dizziness, syncope and light-headedness.  Hematological: Negative.   Psychiatric/Behavioral: Negative.     BP 134/84    Pulse 80    Temp 97.7 F (36.5 C) (Skin)    Resp 20    Ht 5' 8.5" (1.74 m)    Wt 218 lb (98.9 kg)    SpO2 95% Comment: RA   BMI 32.66 kg/m  Physical Exam Constitutional:      Appearance: Normal appearance. He is obese.  HENT:     Head: Normocephalic and atraumatic.  Eyes:     Extraocular Movements: Extraocular movements intact.     Conjunctiva/sclera: Conjunctivae normal.     Pupils: Pupils are equal, round, and reactive to light.  Neck:     Musculoskeletal: Normal range of motion and neck supple.     Vascular: No carotid bruit.  Cardiovascular:     Rate and Rhythm: Normal rate and regular rhythm.     Pulses: Normal pulses.     Heart sounds: Murmur present.     Comments: 3/6 systolic murmur RSB Pulmonary:     Effort: Pulmonary effort is normal.     Breath sounds: Normal breath sounds.  Abdominal:     General: There is no distension.     Tenderness: There is no abdominal tenderness.  Musculoskeletal: Normal range of motion.        General: No swelling.  Skin:    General: Skin is warm and dry.  Neurological:     General: No focal deficit present.     Mental Status: He is alert.  Psychiatric:        Mood and Affect: Mood normal.        Behavior: Behavior normal.        Thought Content: Thought content normal.        Judgment: Judgment  normal.      Diagnostic Tests:  ECHOCARDIOGRAM REPORT     Patient Name: Ronnie Ruiz Date of Exam: 07/01/2018 Medical Rec #: [id number] Height: 68.5 in Accession #:  [id number] Weight: 218.8 lb Date of Birth: [birth year] BSA: 2.13 m Patient Age: 33 years BP: 114/66 mmHg Patient Gender: M HR: 69 bpm. Exam Location: [place]   Procedure: 2D Echo  Indications: Nonrheumatic aortic valve stenosis [I35.0]  History: Patient has no prior history of Echocardiogram examinations. Atherosclerotic heart disease, Bicuspid aortic valve; Signs/Symptoms: Murmur; Risk Factors: Hypertension and Diabetes.  Sonographer: Louie Boston Referring Phys: Gypsy Balsam MD  IMPRESSIONS   1. The left ventricle has normal systolic function with an ejection fraction of 60-65%. The cavity size was normal. There is moderately increased left ventricular wall thickness. Left ventricular diastolic Doppler parameters are consistent with impaired relaxation. 2. The right ventricle has normal systolic function. The cavity was normal. There is no increase in right ventricular wall thickness. 3. Left atrial size was mild-moderately dilated. 4. The aortic valve is abnormal Moderate calcification of the aortic valve. Aortic valve regurgitation is trivial by color flow Doppler. critical stenosis of the aortic valve. 5. There is moderate dilatation of the ascending aorta measuring 41 mm.  FINDINGS Left Ventricle: The left ventricle has normal systolic function, with an ejection fraction of 60-65%. The cavity size was normal. There is moderately increased left ventricular wall thickness. Left ventricular diastolic Doppler parameters are consistent with impaired relaxation. Right Ventricle: The right ventricle has normal systolic function. The cavity was normal. There is no  increase in right ventricular wall thickness. Left Atrium: left atrial size was mild-moderately dilated Right Atrium: right atrial size was normal in size. Right atrial pressure is estimated at 8 mmHg. Interatrial Septum: No atrial level shunt detected by color flow Doppler. Pericardium: There is no evidence of pericardial effusion. Mitral Valve: The mitral valve is normal in structure. Mitral valve regurgitation is mild by color flow Doppler. Tricuspid Valve: The tricuspid valve is normal in structure. Tricuspid valve regurgitation was not visualized by color flow Doppler. Aortic Valve: The aortic valve is abnormal Moderate calcification of the aortic valve. Aortic valve regurgitation is trivial by color flow Doppler. There is critical stenosis of the aortic valve, with a calculated valve area of 0.73 cm. Pulmonic Valve: The pulmonic valve was not well visualized. Pulmonic valve regurgitation was not assessed by color flow Doppler. Aorta: There is moderate dilatation of the ascending aorta measuring 41 mm. Venous: The inferior vena cava measures 2.30 cm, is normal in size with greater than 50% respiratory variability.  LEFT VENTRICLE PLAX 2D LVIDd: 5.70 cm Diastology LVIDs: 4.30 cm LV e' lateral: 7.62 cm/s LV PW: 1.50 cm LV E/e' lateral: 9.3 LV IVS: 1.50 cm LV e' medial: 5.98 cm/s LVOT diam: 2.00 cm LV E/e' medial: 11.8 LV SV: 77 ml LV SV Index: 34.62 LVOT Area: 3.14 cm  RIGHT VENTRICLE RV S prime: 12.70 cm/s TAPSE (M-mode): 2.2 cm RVSP: 20.2 mmHg  LEFT ATRIUM Index RIGHT ATRIUM Index LA diam: 4.80 cm 2.25 cm/m RA Pressure: 8 mmHg LA Vol (A2C): 49.3 ml 23.09 ml/m RA Area: 19.70 cm LA Vol (A4C): 91.1 ml 42.67 ml/m RA Volume: 58.00 ml 27.17 ml/m LA Biplane Vol: 67.7 ml 31.71 ml/m AORTIC VALVE AV Area (Vmax): 0.70 cm AV Area (Vmean): 0.63 cm AV  Area (VTI): 0.73 cm AV Vmax: 500.00 cm/s AV Vmean: 381.000 cm/s AV VTI: 1.280 m AV Peak Grad: 100.0 mmHg AV Mean Grad: 64.0 mmHg LVOT Vmax: 111.00 cm/s LVOT Vmean: 76.600 cm/s LVOT VTI: 0.296 m LVOT/AV VTI ratio: 0.23  AORTA Ao Root diam: 3.60 cm Ao Asc diam: 4.15 cm  MITRAL VALVE TRICUSPID VALVE  MV Area (PHT): 2.95 cm TR Peak grad: 12.2 mmHg MV PHT: 74.53 msec TR Vmax: 175.00 cm/s MV Decel Time: 257 msec RVSP: 20.2 mmHg MV E velocity: 70.80 cm/s MV A velocity: 64.00 cm/s SHUNTS MV E/A ratio: 1.11 Systemic VTI: 0.30 m Systemic Diam: 2.00 cm  IVC IVC diam: 2.30 cm   Belva Crome MD Electronically signed by Belva Crome MD Signature Date/Time: 07/01/2018/12:13:59 PM    Physicians   Panel Physicians Referring Physician Case Authorizing Physician  Ronnie Bihari, MD (Primary)    Procedures   RIGHT HEART CATH AND CORONARY ANGIOGRAPHY  Conclusion     Prox LAD lesion is 5% stenosed.  There is trivial (1+) aortic regurgitation.  Mild coronary calcification without significant coronary obstructive disease with a normal left main, minimal luminal narrowing of 5% in the LAD; normal dominant left circumflex coronary artery, and normal nondominant RCA.  Mildly increased right heart pressures.  Dilated aortic root.  Severely calcified previously diagnosed bicuspid aortic valve with markedly reduced excursion. There is trace to mild aortic insufficiency. The aortic valve was not able to be crossed. Echocardiographic gradient assessment revealed a mean gradient of 64, peak instantaneous gradient of 100 mmHg and a calculated valve area of 0.6 to 0.7 cm consistent with critical aortic stenosis.   Echocardiographic documentation of normal LV function with an EF of 60 to 65%.  RECOMMENDATION: The patient  is followed by Dr. Bing Ruiz. Surgical consultation will be necessary. In this 53 year old patient with bicuspid valve, probable SAVR rather than TAVR will be necessary.    Recommendations   Antiplatelet/Anticoag Recommend Aspirin 81mg  daily for moderate CAD.  Indications   Critical aortic valve stenosis [I35.0 (ICD-10-CM)]  Procedural Details   Technical Details Mr. Brooklynn Silverstone is a 53 year old gentleman who has a history of a bicuspid aortic valve. Recent echo Doppler study has revealed critical aortic stenosis with a mean gradient of 64 and peak gradient of 100. He is referred by Dr. Bing Ruiz for right and left heart cardiac catheterization.  The patient arrived to the catheterization laboratory in the fasting state. An IV was already in place in his left antecubital vein. He was given Versed 1 mg and fentanyl 25 mcg for conscious sedation. His left antecubital vein sheath was exchanged for a 5 [place] glide sheath slender sheath. Right heart catheterization was performed and pressures were obtained in the RA, RV, PA, and PW positions. Oxygen saturation was obtained in the pulmonary artery. Attention was then directed at the right radial approach for his left heart catheterization. The right radial artery was punctured anteriorly and a 6 French glide sheath slender sheath was inserted. Verapamil 3 mg was administered. 5000 units of weight adjusted heparin was administered. There was significant looping entering the ascending aorta due to a dilated aortic vessel. There was significant difficulty cannulating his coronary arteries. Ultimately the right coronary artery was successfully cannulated with a 5 [place] TIG catheter. Multiple catheters were used to attempt cannulation of the left main including a JL 3.5, JL4, EZ rad left, EBU4.0, and Ronnie catheter. There was only one adequate and ejection with other suboptimal injections. As result the procedure was transitioned to the  femoral approach. The right femoral artery was punctured anteriorly with one stick and a 5 French sheath was inserted. A JL 5 catheter was inserted with immediate cannulation into the left main. Avril injections of the left coronary system were obtained. A pigtail catheter was then inserted and supravalvular aortography was performed. An attempt  was made prior to cross the aortic valve with the pigtail catheter and AL-1 catheters but after 3 attempts the decision was made to abort this in light of the significant echocardiographic findings to reduce potential Bolick risk. TR band was applied to the right radial artery. Swan-Ganz catheter was removed. All catheters were removed with hemostasis obtained by manual pressure to the femoral artery and left antecubital fossa. The patient tolerated procedure well and returned to his room in satisfactory condition. Estimated blood loss <50 mL.   During this procedure medications were administered to achieve and maintain moderate conscious sedation while the patient's heart rate, blood pressure, and oxygen saturation were continuously monitored and I was present face-to-face 100% of this time.   Coronary Findings   Diagnostic  Dominance: Left  Left Anterior Descending  Prox LAD lesion 5% stenosed  Prox LAD lesion is 5% stenosed.  Intervention   No interventions have been documented.  Right Heart   Right Heart Pressures RA: A-wave 13 V wave 7; mean 6 RV: 39/11 PA: 32/13; mean 20 PW: A-wave 19 V wave 16; mean 13  Central aortic pressure 121/60  Oxygen saturation in the pulmonary artery 70% and in the central aorta 98%.  By the [facility] method cardiac output 5.4 L/min and cardiac index 2.5 L/min/m.  SVR: 14.2 PVR 1.9 WU    Impression:  This 53 year old gentleman has a bicuspid aortic valve with stage D, critical, symptomatic aortic stenosis with New York Heart Association class III symptoms of exertional fatigue and shortness of  breath as well as substernal chest discomfort consistent with chronic diastolic congestive heart failure.  I have personally reviewed his 2D echocardiogram and cardiac catheterization studies.  His 2D echocardiogram shows a bicuspid aortic valve with severe calcification and thickening of the valve leaflets with restricted mobility.  The mean gradient across aortic valve was 64 mmHg consistent with critical aortic stenosis.  Left ventricular systolic function is normal.  The ascending aorta was measured at 41.5 mm by echo.  Cardiac catheterization shows no significant coronary disease.  Aortic root injection does show some enlargement of the ascending aorta.  Aortic valve replacement is clearly indicated in this patient for relief of his symptoms and to prevent progressive left ventricular deterioration and death.  He does have some enlargement of the ascending aorta to 4.2 cm by CTA and a bicuspid valve. His descending aorta is 2.7 cm. He is 53 years old so I think it is reasonable to replace his aortic aneurysm to decrease the long term risk of aortic enlargement and dissection.  His dental problems have been evaluated by his dentist and he had several bad teeth extracted.   I discussed the option of bioprosthetic and mechanical aortic valve prostheses and the benefits and risk of both.  I think a bioprosthetic valve would be the best option for him given his chronic arthritis with nonsteroidal anti-inflammatory use and alcohol intake.  A bioprosthetic valve would have good durability in his age group and avoid the need for Coumadin with his associated risk.  He and his wife are in agreement with that.    Plan:  Bentall procedure using a bioprosthetic composite valve/conduit.   Alleen BorneBryan K Viggo Perko, MD [facility] [phone]

## 2018-09-10 ENCOUNTER — Other Ambulatory Visit: Payer: Self-pay

## 2018-09-10 ENCOUNTER — Inpatient Hospital Stay (HOSPITAL_COMMUNITY): Payer: Commercial Managed Care - PPO

## 2018-09-10 ENCOUNTER — Inpatient Hospital Stay (HOSPITAL_COMMUNITY): Payer: Commercial Managed Care - PPO | Admitting: Vascular Surgery

## 2018-09-10 ENCOUNTER — Inpatient Hospital Stay (HOSPITAL_COMMUNITY): Payer: Commercial Managed Care - PPO | Admitting: Anesthesiology

## 2018-09-10 ENCOUNTER — Inpatient Hospital Stay (HOSPITAL_COMMUNITY)
Admission: RE | Admit: 2018-09-10 | Discharge: 2018-09-15 | DRG: 220 | Disposition: A | Discharge status: Home / Self Care | Payer: Commercial Managed Care - PPO | Attending: Surgery | Admitting: Surgery

## 2018-09-10 ENCOUNTER — Encounter (HOSPITAL_COMMUNITY)
Admission: RE | Disposition: A | Discharge status: Home / Self Care | Payer: Self-pay | Source: Home / Self Care | Attending: Surgery

## 2018-09-10 ENCOUNTER — Encounter (HOSPITAL_COMMUNITY): Payer: Self-pay

## 2018-09-10 DIAGNOSIS — I712 Thoracic aortic aneurysm, without rupture, unspecified: Secondary | ICD-10-CM

## 2018-09-10 DIAGNOSIS — I350 Nonrheumatic aortic (valve) stenosis: Secondary | ICD-10-CM

## 2018-09-10 DIAGNOSIS — Z7982 Long term (current) use of aspirin: Secondary | ICD-10-CM

## 2018-09-10 DIAGNOSIS — I083 Combined rheumatic disorders of mitral, aortic and tricuspid valves: Secondary | ICD-10-CM | POA: Diagnosis present

## 2018-09-10 DIAGNOSIS — D62 Acute posthemorrhagic anemia: Secondary | ICD-10-CM | POA: Diagnosis not present

## 2018-09-10 DIAGNOSIS — K219 Gastro-esophageal reflux disease without esophagitis: Secondary | ICD-10-CM | POA: Diagnosis present

## 2018-09-10 DIAGNOSIS — D6959 Other secondary thrombocytopenia: Secondary | ICD-10-CM | POA: Diagnosis not present

## 2018-09-10 DIAGNOSIS — Z8249 Family history of ischemic heart disease and other diseases of the circulatory system: Secondary | ICD-10-CM

## 2018-09-10 DIAGNOSIS — M1990 Unspecified osteoarthritis, unspecified site: Secondary | ICD-10-CM | POA: Diagnosis present

## 2018-09-10 DIAGNOSIS — E8770 Fluid overload, unspecified: Secondary | ICD-10-CM | POA: Diagnosis not present

## 2018-09-10 DIAGNOSIS — F17290 Nicotine dependence, other tobacco product, uncomplicated: Secondary | ICD-10-CM | POA: Diagnosis present

## 2018-09-10 DIAGNOSIS — E785 Hyperlipidemia, unspecified: Secondary | ICD-10-CM | POA: Diagnosis present

## 2018-09-10 DIAGNOSIS — Z811 Family history of alcohol abuse and dependence: Secondary | ICD-10-CM

## 2018-09-10 DIAGNOSIS — Z7984 Long term (current) use of oral hypoglycemic drugs: Secondary | ICD-10-CM | POA: Diagnosis not present

## 2018-09-10 DIAGNOSIS — E119 Type 2 diabetes mellitus without complications: Secondary | ICD-10-CM | POA: Diagnosis present

## 2018-09-10 DIAGNOSIS — Z8349 Family history of other endocrine, nutritional and metabolic diseases: Secondary | ICD-10-CM

## 2018-09-10 DIAGNOSIS — I2510 Atherosclerotic heart disease of native coronary artery without angina pectoris: Secondary | ICD-10-CM | POA: Diagnosis present

## 2018-09-10 DIAGNOSIS — F1020 Alcohol dependence, uncomplicated: Secondary | ICD-10-CM | POA: Diagnosis present

## 2018-09-10 DIAGNOSIS — D689 Coagulation defect, unspecified: Secondary | ICD-10-CM | POA: Diagnosis not present

## 2018-09-10 DIAGNOSIS — Z79899 Other long term (current) drug therapy: Secondary | ICD-10-CM

## 2018-09-10 DIAGNOSIS — I10 Essential (primary) hypertension: Secondary | ICD-10-CM | POA: Diagnosis present

## 2018-09-10 DIAGNOSIS — J9811 Atelectasis: Secondary | ICD-10-CM

## 2018-09-10 DIAGNOSIS — Z1159 Encounter for screening for other viral diseases: Secondary | ICD-10-CM | POA: Diagnosis not present

## 2018-09-10 DIAGNOSIS — Z09 Encounter for follow-up examination after completed treatment for conditions other than malignant neoplasm: Secondary | ICD-10-CM

## 2018-09-10 DIAGNOSIS — Z952 Presence of prosthetic heart valve: Secondary | ICD-10-CM

## 2018-09-10 HISTORY — PX: TEE WITHOUT CARDIOVERSION: SHX5443

## 2018-09-10 HISTORY — PX: BENTALL PROCEDURE: SHX5058

## 2018-09-10 LAB — POCT I-STAT 7, (LYTES, BLD GAS, ICA,H+H)
Acid-Base Excess: 1 mmol/L (ref 0.0–2.0)
Acid-base deficit: 2 mmol/L (ref 0.0–2.0)
Acid-base deficit: 3 mmol/L — ABNORMAL HIGH (ref 0.0–2.0)
Acid-base deficit: 4 mmol/L — ABNORMAL HIGH (ref 0.0–2.0)
Acid-base deficit: 1 mmol/L (ref 0.0–2.0)
Bicarbonate: 26.1 mmol/L (ref 20.0–28.0)
Bicarbonate: 26.4 mmol/L (ref 20.0–28.0)
Bicarbonate: 22.4 mmol/L (ref 20.0–28.0)
Bicarbonate: 23.9 mmol/L (ref 20.0–28.0)
Bicarbonate: 20.8 mmol/L (ref 20.0–28.0)
Bicarbonate: 23 mmol/L (ref 20.0–28.0)
Bicarbonate: 25 mmol/L (ref 20.0–28.0)
Calcium, Ion: 1.03 mmol/L — ABNORMAL LOW (ref 1.15–1.40)
Calcium, Ion: 1.07 mmol/L — ABNORMAL LOW (ref 1.15–1.40)
Calcium, Ion: 1.04 mmol/L — ABNORMAL LOW (ref 1.15–1.40)
Calcium, Ion: 1.06 mmol/L — ABNORMAL LOW (ref 1.15–1.40)
Calcium, Ion: 1.02 mmol/L — ABNORMAL LOW (ref 1.15–1.40)
Calcium, Ion: 0.85 mmol/L — CL (ref 1.15–1.40)
Calcium, Ion: 1.06 mmol/L — ABNORMAL LOW (ref 1.15–1.40)
HCT: 26 % — ABNORMAL LOW (ref 39.0–52.0)
HCT: 27 % — ABNORMAL LOW (ref 39.0–52.0)
HCT: 26 % — ABNORMAL LOW (ref 39.0–52.0)
HCT: 28 % — ABNORMAL LOW (ref 39.0–52.0)
HCT: 24 % — ABNORMAL LOW (ref 39.0–52.0)
HCT: 23 % — ABNORMAL LOW (ref 39.0–52.0)
HCT: 24 % — ABNORMAL LOW (ref 39.0–52.0)
Hemoglobin: 8.8 g/dL — ABNORMAL LOW (ref 13.0–17.0)
Hemoglobin: 9.2 g/dL — ABNORMAL LOW (ref 13.0–17.0)
Hemoglobin: 8.8 g/dL — ABNORMAL LOW (ref 13.0–17.0)
Hemoglobin: 9.5 g/dL — ABNORMAL LOW (ref 13.0–17.0)
Hemoglobin: 8.2 g/dL — ABNORMAL LOW (ref 13.0–17.0)
Hemoglobin: 7.8 g/dL — ABNORMAL LOW (ref 13.0–17.0)
Hemoglobin: 8.2 g/dL — ABNORMAL LOW (ref 13.0–17.0)
O2 Saturation: 100 %
O2 Saturation: 100 %
O2 Saturation: 100 %
O2 Saturation: 100 %
O2 Saturation: 100 %
O2 Saturation: 100 %
O2 Saturation: 99 %
Patient temperature: 37.3
Potassium: 3.6 mmol/L (ref 3.5–5.1)
Potassium: 4.1 mmol/L (ref 3.5–5.1)
Potassium: 3.8 mmol/L (ref 3.5–5.1)
Potassium: 4.7 mmol/L (ref 3.5–5.1)
Potassium: 5.1 mmol/L (ref 3.5–5.1)
Potassium: 4.1 mmol/L (ref 3.5–5.1)
Potassium: 4.1 mmol/L (ref 3.5–5.1)
Sodium: 136 mmol/L (ref 135–145)
Sodium: 137 mmol/L (ref 135–145)
Sodium: 138 mmol/L (ref 135–145)
Sodium: 141 mmol/L (ref 135–145)
Sodium: 142 mmol/L (ref 135–145)
Sodium: 143 mmol/L (ref 135–145)
Sodium: 144 mmol/L (ref 135–145)
TCO2: 28 mmol/L (ref 22–32)
TCO2: 28 mmol/L (ref 22–32)
TCO2: 24 mmol/L (ref 22–32)
TCO2: 25 mmol/L (ref 22–32)
TCO2: 22 mmol/L (ref 22–32)
TCO2: 24 mmol/L (ref 22–32)
TCO2: 26 mmol/L (ref 22–32)
pCO2 arterial: 45.8 mmHg (ref 32.0–48.0)
pCO2 arterial: 50.2 mmHg — ABNORMAL HIGH (ref 32.0–48.0)
pCO2 arterial: 40.2 mmHg (ref 32.0–48.0)
pCO2 arterial: 43.8 mmHg (ref 32.0–48.0)
pCO2 arterial: 36.7 mmHg (ref 32.0–48.0)
pCO2 arterial: 35.4 mmHg (ref 32.0–48.0)
pCO2 arterial: 40.1 mmHg (ref 32.0–48.0)
pH, Arterial: 7.329 — ABNORMAL LOW (ref 7.350–7.450)
pH, Arterial: 7.365 (ref 7.350–7.450)
pH, Arterial: 7.345 — ABNORMAL LOW (ref 7.350–7.450)
pH, Arterial: 7.354 (ref 7.350–7.450)
pH, Arterial: 7.363 (ref 7.350–7.450)
pH, Arterial: 7.421 (ref 7.350–7.450)
pH, Arterial: 7.405 (ref 7.350–7.450)
pO2, Arterial: 330 mmHg — ABNORMAL HIGH (ref 83.0–108.0)
pO2, Arterial: 400 mmHg — ABNORMAL HIGH (ref 83.0–108.0)
pO2, Arterial: 284 mmHg — ABNORMAL HIGH (ref 83.0–108.0)
pO2, Arterial: 438 mmHg — ABNORMAL HIGH (ref 83.0–108.0)
pO2, Arterial: 293 mmHg — ABNORMAL HIGH (ref 83.0–108.0)
pO2, Arterial: 386 mmHg — ABNORMAL HIGH (ref 83.0–108.0)
pO2, Arterial: 166 mmHg — ABNORMAL HIGH (ref 83.0–108.0)

## 2018-09-10 LAB — POCT I-STAT 4, (NA,K, GLUC, HGB,HCT)
Glucose, Bld: 137 mg/dL — ABNORMAL HIGH (ref 70–99)
Glucose, Bld: 126 mg/dL — ABNORMAL HIGH (ref 70–99)
Glucose, Bld: 128 mg/dL — ABNORMAL HIGH (ref 70–99)
Glucose, Bld: 134 mg/dL — ABNORMAL HIGH (ref 70–99)
Glucose, Bld: 147 mg/dL — ABNORMAL HIGH (ref 70–99)
Glucose, Bld: 181 mg/dL — ABNORMAL HIGH (ref 70–99)
Glucose, Bld: 189 mg/dL — ABNORMAL HIGH (ref 70–99)
Glucose, Bld: 189 mg/dL — ABNORMAL HIGH (ref 70–99)
Glucose, Bld: 128 mg/dL — ABNORMAL HIGH (ref 70–99)
Glucose, Bld: 156 mg/dL — ABNORMAL HIGH (ref 70–99)
Glucose, Bld: 137 mg/dL — ABNORMAL HIGH (ref 70–99)
Glucose, Bld: 130 mg/dL — ABNORMAL HIGH (ref 70–99)
Glucose, Bld: 114 mg/dL — ABNORMAL HIGH (ref 70–99)
Glucose, Bld: 96 mg/dL (ref 70–99)
HCT: 33 % — ABNORMAL LOW (ref 39.0–52.0)
HCT: 26 % — ABNORMAL LOW (ref 39.0–52.0)
HCT: 26 % — ABNORMAL LOW (ref 39.0–52.0)
HCT: 32 % — ABNORMAL LOW (ref 39.0–52.0)
HCT: 27 % — ABNORMAL LOW (ref 39.0–52.0)
HCT: 27 % — ABNORMAL LOW (ref 39.0–52.0)
HCT: 27 % — ABNORMAL LOW (ref 39.0–52.0)
HCT: 27 % — ABNORMAL LOW (ref 39.0–52.0)
HCT: 23 % — ABNORMAL LOW (ref 39.0–52.0)
HCT: 26 % — ABNORMAL LOW (ref 39.0–52.0)
HCT: 22 % — ABNORMAL LOW (ref 39.0–52.0)
HCT: 22 % — ABNORMAL LOW (ref 39.0–52.0)
HCT: 20 % — ABNORMAL LOW (ref 39.0–52.0)
HCT: 26 % — ABNORMAL LOW (ref 39.0–52.0)
Hemoglobin: 11.2 g/dL — ABNORMAL LOW (ref 13.0–17.0)
Hemoglobin: 10.9 g/dL — ABNORMAL LOW (ref 13.0–17.0)
Hemoglobin: 8.8 g/dL — ABNORMAL LOW (ref 13.0–17.0)
Hemoglobin: 8.8 g/dL — ABNORMAL LOW (ref 13.0–17.0)
Hemoglobin: 9.2 g/dL — ABNORMAL LOW (ref 13.0–17.0)
Hemoglobin: 9.2 g/dL — ABNORMAL LOW (ref 13.0–17.0)
Hemoglobin: 9.2 g/dL — ABNORMAL LOW (ref 13.0–17.0)
Hemoglobin: 9.2 g/dL — ABNORMAL LOW (ref 13.0–17.0)
Hemoglobin: 7.8 g/dL — ABNORMAL LOW (ref 13.0–17.0)
Hemoglobin: 8.8 g/dL — ABNORMAL LOW (ref 13.0–17.0)
Hemoglobin: 7.5 g/dL — ABNORMAL LOW (ref 13.0–17.0)
Hemoglobin: 7.5 g/dL — ABNORMAL LOW (ref 13.0–17.0)
Hemoglobin: 6.8 g/dL — CL (ref 13.0–17.0)
Hemoglobin: 8.8 g/dL — ABNORMAL LOW (ref 13.0–17.0)
Potassium: 3.9 mmol/L (ref 3.5–5.1)
Potassium: 3.5 mmol/L (ref 3.5–5.1)
Potassium: 4 mmol/L (ref 3.5–5.1)
Potassium: 4 mmol/L (ref 3.5–5.1)
Potassium: 3.7 mmol/L (ref 3.5–5.1)
Potassium: 4 mmol/L (ref 3.5–5.1)
Potassium: 4.6 mmol/L (ref 3.5–5.1)
Potassium: 4.5 mmol/L (ref 3.5–5.1)
Potassium: 3.8 mmol/L (ref 3.5–5.1)
Potassium: 4.6 mmol/L (ref 3.5–5.1)
Potassium: 5 mmol/L (ref 3.5–5.1)
Potassium: 4 mmol/L (ref 3.5–5.1)
Potassium: 3.7 mmol/L (ref 3.5–5.1)
Potassium: 3.9 mmol/L (ref 3.5–5.1)
Sodium: 138 mmol/L (ref 135–145)
Sodium: 136 mmol/L (ref 135–145)
Sodium: 138 mmol/L (ref 135–145)
Sodium: 138 mmol/L (ref 135–145)
Sodium: 137 mmol/L (ref 135–145)
Sodium: 136 mmol/L (ref 135–145)
Sodium: 137 mmol/L (ref 135–145)
Sodium: 137 mmol/L (ref 135–145)
Sodium: 139 mmol/L (ref 135–145)
Sodium: 141 mmol/L (ref 135–145)
Sodium: 142 mmol/L (ref 135–145)
Sodium: 143 mmol/L (ref 135–145)
Sodium: 146 mmol/L — ABNORMAL HIGH (ref 135–145)
Sodium: 145 mmol/L (ref 135–145)

## 2018-09-10 LAB — HEMOGLOBIN AND HEMATOCRIT, BLOOD
HCT: 28.6 % — ABNORMAL LOW (ref 39.0–52.0)
HCT: 20.6 % — ABNORMAL LOW (ref 39.0–52.0)
HCT: 19.9 % — ABNORMAL LOW (ref 39.0–52.0)
Hemoglobin: 9.9 g/dL — ABNORMAL LOW (ref 13.0–17.0)
Hemoglobin: 7 g/dL — ABNORMAL LOW (ref 13.0–17.0)
Hemoglobin: 7 g/dL — ABNORMAL LOW (ref 13.0–17.0)

## 2018-09-10 LAB — CBC
HCT: 26.7 % — ABNORMAL LOW (ref 39.0–52.0)
Hemoglobin: 9.4 g/dL — ABNORMAL LOW (ref 13.0–17.0)
MCH: 31.3 pg (ref 26.0–34.0)
MCHC: 35.2 g/dL (ref 30.0–36.0)
MCV: 89 fL (ref 80.0–100.0)
Platelets: 124 10*3/uL — ABNORMAL LOW (ref 150–400)
RBC: 3 MIL/uL — ABNORMAL LOW (ref 4.22–5.81)
RDW: 13.2 % (ref 11.5–15.5)
WBC: 7.9 10*3/uL (ref 4.0–10.5)
nRBC: 0 % (ref 0.0–0.2)

## 2018-09-10 LAB — PROTIME-INR
INR: 1.8 — ABNORMAL HIGH (ref 0.8–1.2)
INR: 0.8 (ref 0.8–1.2)
INR: 0.8 (ref 0.8–1.2)
Prothrombin Time: 20.3 seconds — ABNORMAL HIGH (ref 11.4–15.2)
Prothrombin Time: 11.1 seconds — ABNORMAL LOW (ref 11.4–15.2)
Prothrombin Time: 10.9 seconds — ABNORMAL LOW (ref 11.4–15.2)

## 2018-09-10 LAB — ECHO INTRAOPERATIVE TEE
AV Mean grad: 43 mmHg — NL
Ao pk vel: 4.3 m/s — NL
Height: 68.5 in — NL
Weight: 3548.52 oz — NL

## 2018-09-10 LAB — APTT
aPTT: 32 seconds (ref 24–36)
aPTT: 32 seconds (ref 24–36)
aPTT: 30 seconds (ref 24–36)

## 2018-09-10 LAB — GLUCOSE, CAPILLARY
Glucose-Capillary: 165 mg/dL — ABNORMAL HIGH (ref 70–99)
Glucose-Capillary: 145 mg/dL — ABNORMAL HIGH (ref 70–99)

## 2018-09-10 LAB — FIBRINOGEN
Fibrinogen: 214 mg/dL (ref 210–475)
Fibrinogen: 200 mg/dL — ABNORMAL LOW (ref 210–475)
Fibrinogen: 282 mg/dL (ref 210–475)

## 2018-09-10 LAB — PLATELET COUNT
Platelets: 75 10*3/uL — ABNORMAL LOW (ref 150–400)
Platelets: 73 10*3/uL — ABNORMAL LOW (ref 150–400)
Platelets: 129 10*3/uL — ABNORMAL LOW (ref 150–400)

## 2018-09-10 LAB — PREPARE RBC (CROSSMATCH)

## 2018-09-10 SURGERY — BENTALL PROCEDURE
Anesthesia: General | Site: Chest

## 2018-09-10 MED ORDER — NITROGLYCERIN IN D5W 200-5 MCG/ML-% IV SOLN: 0.0000 ug/min | INTRAVENOUS | Status: DC

## 2018-09-10 MED ORDER — MIDAZOLAM HCL 5 MG/5ML IJ SOLN
INTRAMUSCULAR | Status: DC | PRN
  Administered 2018-09-10: 4 mg via INTRAVENOUS
  Administered 2018-09-10 (×2): 2 mg via INTRAVENOUS
  Administered 2018-09-10: 10 mg via INTRAVENOUS
  Administered 2018-09-10: 2 mg via INTRAVENOUS

## 2018-09-10 MED ORDER — ALBUMIN HUMAN 5 % IV SOLN
INTRAVENOUS | Status: DC | PRN
  Administered 2018-09-10 (×3): via INTRAVENOUS

## 2018-09-10 MED ORDER — PHENYLEPHRINE 40 MCG/ML (10ML) SYRINGE FOR IV PUSH (FOR BLOOD PRESSURE SUPPORT)
PREFILLED_SYRINGE | INTRAVENOUS | Status: DC | PRN
  Administered 2018-09-10: 60 ug via INTRAVENOUS
  Administered 2018-09-10: 20 ug via INTRAVENOUS
  Administered 2018-09-10: 40 ug via INTRAVENOUS
  Administered 2018-09-10: 120 ug via INTRAVENOUS
  Administered 2018-09-10 (×2): 40 ug via INTRAVENOUS
  Administered 2018-09-10: 80 ug via INTRAVENOUS
  Administered 2018-09-10: 40 ug via INTRAVENOUS

## 2018-09-10 MED ORDER — LACTATED RINGERS IV SOLN
INTRAVENOUS | Status: DC | PRN
  Administered 2018-09-10: 07:00:00 via INTRAVENOUS

## 2018-09-10 MED ORDER — ACETAMINOPHEN 500 MG PO TABS
1000.0000 mg | ORAL_TABLET | Freq: Four times a day (QID) | ORAL | Status: DC
  Administered 2018-09-11 – 2018-09-15 (×17): 1000 mg via ORAL
  Filled 2018-09-10 (×16): qty 2

## 2018-09-10 MED ORDER — METOPROLOL TARTRATE 25 MG/10 ML ORAL SUSPENSION
12.5000 mg | Freq: Two times a day (BID) | ORAL | Status: DC
  Administered 2018-09-10: 12.5 mg
  Filled 2018-09-10: qty 5

## 2018-09-10 MED ORDER — SODIUM CHLORIDE 0.9 % IV SOLN
INTRAVENOUS | Status: DC | PRN
  Administered 2018-09-10: 750 mg via INTRAVENOUS

## 2018-09-10 MED ORDER — OXYCODONE HCL 5 MG PO TABS
5.0000 mg | ORAL_TABLET | ORAL | Status: DC | PRN
  Administered 2018-09-11 (×3): 10 mg via ORAL
  Administered 2018-09-12: 5 mg via ORAL
  Filled 2018-09-10: qty 2
  Filled 2018-09-10: qty 1
  Filled 2018-09-10 (×2): qty 2

## 2018-09-10 MED ORDER — SODIUM CHLORIDE 0.9% FLUSH
3.0000 mL | Freq: Two times a day (BID) | INTRAVENOUS | Status: DC
  Administered 2018-09-11 – 2018-09-15 (×9): 3 mL via INTRAVENOUS

## 2018-09-10 MED ORDER — FAMOTIDINE IN NACL 20-0.9 MG/50ML-% IV SOLN
20.0000 mg | Freq: Two times a day (BID) | INTRAVENOUS | Status: DC
  Administered 2018-09-10: 20 mg via INTRAVENOUS
  Filled 2018-09-10 (×2): qty 50

## 2018-09-10 MED ORDER — PROTAMINE SULFATE 10 MG/ML IV SOLN
INTRAVENOUS | Status: AC
  Filled 2018-09-10: qty 5

## 2018-09-10 MED ORDER — ACETAMINOPHEN 160 MG/5ML PO SOLN: 1000.0000 mg | Freq: Four times a day (QID) | ORAL | Status: DC

## 2018-09-10 MED ORDER — ACETAMINOPHEN 160 MG/5ML PO SOLN: 650.0000 mg | Freq: Once | ORAL | Status: AC

## 2018-09-10 MED ORDER — NITROPRUSSIDE SODIUM-NACL 10-0.9 MG/50ML-% IV SOLN
0.0000 ug/kg/min | INTRAVENOUS | Status: DC
  Filled 2018-09-10 (×3): qty 50

## 2018-09-10 MED ORDER — SUCCINYLCHOLINE CHLORIDE 200 MG/10ML IV SOSY
PREFILLED_SYRINGE | INTRAVENOUS | Status: AC
  Filled 2018-09-10: qty 10

## 2018-09-10 MED ORDER — VANCOMYCIN HCL IN DEXTROSE 1-5 GM/200ML-% IV SOLN
1000.0000 mg | Freq: Once | INTRAVENOUS | Status: AC
  Administered 2018-09-10: 1000 mg via INTRAVENOUS
  Filled 2018-09-10: qty 200

## 2018-09-10 MED ORDER — ASPIRIN 81 MG PO CHEW: 324.0000 mg | CHEWABLE_TABLET | Freq: Every day | ORAL | Status: DC

## 2018-09-10 MED ORDER — ALBUMIN HUMAN 5 % IV SOLN: 250.0000 mL | INTRAVENOUS | Status: AC | PRN

## 2018-09-10 MED ORDER — ROCURONIUM BROMIDE 10 MG/ML (PF) SYRINGE
PREFILLED_SYRINGE | INTRAVENOUS | Status: AC
  Filled 2018-09-10: qty 10

## 2018-09-10 MED ORDER — THROMBIN 20000 UNITS EX SOLR: CUTANEOUS | Status: DC | PRN

## 2018-09-10 MED ORDER — ARTIFICIAL TEARS OPHTHALMIC OINT
TOPICAL_OINTMENT | OPHTHALMIC | Status: DC | PRN
  Administered 2018-09-10: 1 via OPHTHALMIC

## 2018-09-10 MED ORDER — FENTANYL CITRATE (PF) 250 MCG/5ML IJ SOLN
INTRAMUSCULAR | Status: AC
  Filled 2018-09-10: qty 5

## 2018-09-10 MED ORDER — POTASSIUM CHLORIDE 10 MEQ/50ML IV SOLN: 10.0000 meq | INTRAVENOUS | Status: AC

## 2018-09-10 MED ORDER — NITROPRUSSIDE SODIUM-NACL 20-0.9 MG/100ML-% IV SOLN
0.0000 ug/kg/min | INTRAVENOUS | Status: AC
  Administered 2018-09-10: 20:00:00 .3 ug/kg/min via INTRAVENOUS
  Filled 2018-09-10: qty 100

## 2018-09-10 MED ORDER — PHENYLEPHRINE HCL-NACL 20-0.9 MG/250ML-% IV SOLN: 0.0000 ug/min | INTRAVENOUS | Status: DC

## 2018-09-10 MED ORDER — SODIUM CHLORIDE 0.9 % IV SOLN
INTRAVENOUS | Status: DC | PRN
  Administered 2018-09-10 (×2): via INTRAVENOUS

## 2018-09-10 MED ORDER — MAGNESIUM SULFATE 4 GM/100ML IV SOLN
4.0000 g | Freq: Once | INTRAVENOUS | Status: AC
  Administered 2018-09-10: 4 g via INTRAVENOUS
  Filled 2018-09-10: qty 100

## 2018-09-10 MED ORDER — METOPROLOL TARTRATE 5 MG/5ML IV SOLN: 2.5000 mg | INTRAVENOUS | Status: DC | PRN

## 2018-09-10 MED ORDER — PROPOFOL 10 MG/ML IV BOLUS
INTRAVENOUS | Status: DC | PRN
  Administered 2018-09-10: 30 mg via INTRAVENOUS
  Administered 2018-09-10: 240 mg via INTRAVENOUS
  Administered 2018-09-10: 10 mg via INTRAVENOUS
  Administered 2018-09-10: 60 mg via INTRAVENOUS

## 2018-09-10 MED ORDER — ROCURONIUM BROMIDE 10 MG/ML (PF) SYRINGE
PREFILLED_SYRINGE | INTRAVENOUS | Status: DC | PRN
  Administered 2018-09-10: 100 mg via INTRAVENOUS
  Administered 2018-09-10 (×2): 30 mg via INTRAVENOUS
  Administered 2018-09-10 (×3): 100 mg via INTRAVENOUS

## 2018-09-10 MED ORDER — SODIUM CHLORIDE 0.9 % IV SOLN: 250.0000 mL | INTRAVENOUS | Status: DC

## 2018-09-10 MED ORDER — SODIUM CHLORIDE 0.9% IV SOLUTION: Freq: Once | INTRAVENOUS | Status: DC

## 2018-09-10 MED ORDER — SODIUM CHLORIDE 0.9% FLUSH: 3.0000 mL | INTRAVENOUS | Status: DC | PRN

## 2018-09-10 MED ORDER — LACTATED RINGERS IV SOLN: INTRAVENOUS | Status: DC

## 2018-09-10 MED ORDER — COAGULATION FACTOR VIIA RECOMB 1 MG IV SOLR
45.0000 ug/kg | Freq: Once | INTRAVENOUS | Status: DC
  Filled 2018-09-10: qty 5

## 2018-09-10 MED ORDER — LACTATED RINGERS IV SOLN
INTRAVENOUS | Status: DC | PRN
  Administered 2018-09-10 (×2): via INTRAVENOUS

## 2018-09-10 MED ORDER — BISACODYL 5 MG PO TBEC
10.0000 mg | DELAYED_RELEASE_TABLET | Freq: Every day | ORAL | Status: DC
  Administered 2018-09-11 – 2018-09-14 (×3): 10 mg via ORAL
  Filled 2018-09-10 (×4): qty 2

## 2018-09-10 MED ORDER — THROMBIN 20000 UNITS EX SOLR
OROMUCOSAL | Status: DC | PRN
  Administered 2018-09-10: 20 mL via TOPICAL

## 2018-09-10 MED ORDER — CHLORHEXIDINE GLUCONATE 4 % EX LIQD: 30.0000 mL | CUTANEOUS | Status: DC

## 2018-09-10 MED ORDER — ASPIRIN EC 325 MG PO TBEC
325.0000 mg | DELAYED_RELEASE_TABLET | Freq: Every day | ORAL | Status: DC
  Administered 2018-09-11 – 2018-09-15 (×5): 325 mg via ORAL
  Filled 2018-09-10 (×5): qty 1

## 2018-09-10 MED ORDER — CHLORHEXIDINE GLUCONATE 0.12 % MT SOLN: 15.0000 mL | Freq: Once | OROMUCOSAL | Status: DC

## 2018-09-10 MED ORDER — FENTANYL CITRATE (PF) 250 MCG/5ML IJ SOLN
INTRAMUSCULAR | Status: AC
  Filled 2018-09-10: qty 20

## 2018-09-10 MED ORDER — NITROPRUSSIDE SODIUM-NACL 20-0.9 MG/100ML-% IV SOLN
0.0000 ug/kg/min | INTRAVENOUS | Status: DC
  Administered 2018-09-11: 0.65 ug/kg/min via INTRAVENOUS
  Administered 2018-09-11: 0.7 ug/kg/min via INTRAVENOUS
  Filled 2018-09-10 (×3): qty 100

## 2018-09-10 MED ORDER — ONDANSETRON HCL 4 MG/2ML IJ SOLN
4.0000 mg | Freq: Four times a day (QID) | INTRAMUSCULAR | Status: DC | PRN
  Administered 2018-09-11 (×2): 4 mg via INTRAVENOUS
  Filled 2018-09-10 (×2): qty 2

## 2018-09-10 MED ORDER — PROPOFOL 10 MG/ML IV BOLUS
INTRAVENOUS | Status: AC
  Filled 2018-09-10: qty 20

## 2018-09-10 MED ORDER — MORPHINE SULFATE (PF) 2 MG/ML IV SOLN
1.0000 mg | INTRAVENOUS | Status: DC | PRN
  Administered 2018-09-10 (×2): 2 mg via INTRAVENOUS
  Administered 2018-09-11: 4 mg via INTRAVENOUS
  Administered 2018-09-12: 2 mg via INTRAVENOUS
  Filled 2018-09-10 (×2): qty 1
  Filled 2018-09-10: qty 2

## 2018-09-10 MED ORDER — DEXMEDETOMIDINE HCL IN NACL 200 MCG/50ML IV SOLN
INTRAVENOUS | Status: AC
  Filled 2018-09-10: qty 50

## 2018-09-10 MED ORDER — PANTOPRAZOLE SODIUM 40 MG PO TBEC
40.0000 mg | DELAYED_RELEASE_TABLET | Freq: Every day | ORAL | Status: DC
  Administered 2018-09-12 – 2018-09-15 (×4): 40 mg via ORAL
  Filled 2018-09-10 (×4): qty 1

## 2018-09-10 MED ORDER — TRAMADOL HCL 50 MG PO TABS: 50.0000 mg | ORAL_TABLET | ORAL | Status: DC | PRN

## 2018-09-10 MED ORDER — INSULIN REGULAR(HUMAN) IN NACL 100-0.9 UT/100ML-% IV SOLN
INTRAVENOUS | Status: DC
  Administered 2018-09-11: 2.7 [IU]/h via INTRAVENOUS
  Filled 2018-09-10: qty 100

## 2018-09-10 MED ORDER — DOCUSATE SODIUM 100 MG PO CAPS
200.0000 mg | ORAL_CAPSULE | Freq: Every day | ORAL | Status: DC
  Administered 2018-09-11 – 2018-09-15 (×4): 200 mg via ORAL
  Filled 2018-09-10 (×4): qty 2

## 2018-09-10 MED ORDER — MIDAZOLAM HCL (PF) 10 MG/2ML IJ SOLN
INTRAMUSCULAR | Status: AC
  Filled 2018-09-10: qty 2

## 2018-09-10 MED ORDER — CHLORHEXIDINE GLUCONATE CLOTH 2 % EX PADS
6.0000 | MEDICATED_PAD | Freq: Every day | CUTANEOUS | Status: DC
  Administered 2018-09-11 – 2018-09-12 (×2): 6 via TOPICAL

## 2018-09-10 MED ORDER — PROTAMINE SULFATE 10 MG/ML IV SOLN
INTRAVENOUS | Status: DC | PRN
  Administered 2018-09-10 (×2): 300 mg via INTRAVENOUS
  Administered 2018-09-10: 280 mg via INTRAVENOUS

## 2018-09-10 MED ORDER — DEXMEDETOMIDINE HCL IN NACL 400 MCG/100ML IV SOLN: 0.0000 ug/kg/h | INTRAVENOUS | Status: DC

## 2018-09-10 MED ORDER — SODIUM CHLORIDE 0.9 % IV SOLN
INTRAVENOUS | Status: DC | PRN
  Administered 2018-09-10 (×2): 10 ug/min via INTRAVENOUS

## 2018-09-10 MED ORDER — MORPHINE SULFATE (PF) 2 MG/ML IV SOLN
INTRAVENOUS | Status: AC
  Administered 2018-09-10: 22:00:00 2 mg via INTRAVENOUS
  Filled 2018-09-10: qty 1

## 2018-09-10 MED ORDER — TRANEXAMIC ACID-NACL 1000-0.7 MG/100ML-% IV SOLN
INTRAVENOUS | Status: AC
  Filled 2018-09-10: qty 100

## 2018-09-10 MED ORDER — HEMOSTATIC AGENTS (NO CHARGE) OPTIME
TOPICAL | Status: DC | PRN
  Administered 2018-09-10 (×2): 1 via TOPICAL

## 2018-09-10 MED ORDER — SODIUM CHLORIDE (PF) 0.9 % IJ SOLN
INTRAMUSCULAR | Status: AC
  Filled 2018-09-10: qty 20

## 2018-09-10 MED ORDER — LACTATED RINGERS IV SOLN: 500.0000 mL | Freq: Once | INTRAVENOUS | Status: DC | PRN

## 2018-09-10 MED ORDER — ACETAMINOPHEN 650 MG RE SUPP
650.0000 mg | Freq: Once | RECTAL | Status: AC
  Administered 2018-09-10: 650 mg via RECTAL

## 2018-09-10 MED ORDER — MIDAZOLAM HCL 2 MG/2ML IJ SOLN
2.0000 mg | INTRAMUSCULAR | Status: DC | PRN
  Administered 2018-09-10: 2 mg via INTRAVENOUS

## 2018-09-10 MED ORDER — SODIUM CHLORIDE 0.45 % IV SOLN
INTRAVENOUS | Status: DC | PRN
  Administered 2018-09-10: 20:00:00 via INTRAVENOUS

## 2018-09-10 MED ORDER — LIDOCAINE 2% (20 MG/ML) 5 ML SYRINGE
INTRAMUSCULAR | Status: AC
  Filled 2018-09-10: qty 5

## 2018-09-10 MED ORDER — INSULIN REGULAR BOLUS VIA INFUSION
0.0000 [IU] | Freq: Three times a day (TID) | INTRAVENOUS | Status: DC
  Filled 2018-09-10: qty 10

## 2018-09-10 MED ORDER — MIDAZOLAM HCL 2 MG/2ML IJ SOLN
INTRAMUSCULAR | Status: AC
  Administered 2018-09-10: 2 mg via INTRAVENOUS
  Filled 2018-09-10: qty 2

## 2018-09-10 MED ORDER — FENTANYL CITRATE (PF) 250 MCG/5ML IJ SOLN
INTRAMUSCULAR | Status: DC | PRN
  Administered 2018-09-10: 200 ug via INTRAVENOUS
  Administered 2018-09-10 (×3): 250 ug via INTRAVENOUS
  Administered 2018-09-10: 100 ug via INTRAVENOUS
  Administered 2018-09-10: 300 ug via INTRAVENOUS
  Administered 2018-09-10 (×2): 50 ug via INTRAVENOUS
  Administered 2018-09-10: 200 ug via INTRAVENOUS
  Administered 2018-09-10: 50 ug via INTRAVENOUS
  Administered 2018-09-10: 150 ug via INTRAVENOUS
  Administered 2018-09-10: 100 ug via INTRAVENOUS
  Administered 2018-09-10: 50 ug via INTRAVENOUS

## 2018-09-10 MED ORDER — DEXAMETHASONE SODIUM PHOSPHATE 10 MG/ML IJ SOLN
INTRAMUSCULAR | Status: DC | PRN
  Administered 2018-09-10: 10 mg via INTRAVENOUS

## 2018-09-10 MED ORDER — SODIUM CHLORIDE 0.9 % IV SOLN
1.5000 g | Freq: Two times a day (BID) | INTRAVENOUS | Status: DC
  Administered 2018-09-10 – 2018-09-11 (×3): 1.5 g via INTRAVENOUS
  Filled 2018-09-10 (×5): qty 1.5

## 2018-09-10 MED ORDER — SODIUM CHLORIDE 0.9% FLUSH
10.0000 mL | Freq: Two times a day (BID) | INTRAVENOUS | Status: DC
  Administered 2018-09-11 – 2018-09-14 (×4): 10 mL

## 2018-09-10 MED ORDER — SODIUM CHLORIDE 0.9 % IV SOLN: INTRAVENOUS | Status: DC

## 2018-09-10 MED ORDER — COAGULATION FACTOR VIIA RECOMB 1 MG IV SOLR
45.0000 ug/kg | Freq: Once | INTRAVENOUS | Status: AC
  Administered 2018-09-10 (×2): 5000 ug via INTRAVENOUS
  Filled 2018-09-10: qty 5

## 2018-09-10 MED ORDER — HEPARIN SODIUM (PORCINE) 1000 UNIT/ML IJ SOLN
INTRAMUSCULAR | Status: AC
  Filled 2018-09-10: qty 1

## 2018-09-10 MED ORDER — ROCURONIUM BROMIDE 10 MG/ML (PF) SYRINGE
PREFILLED_SYRINGE | INTRAVENOUS | Status: AC
  Filled 2018-09-10: qty 20

## 2018-09-10 MED ORDER — HEPARIN SODIUM (PORCINE) 1000 UNIT/ML IJ SOLN
INTRAMUSCULAR | Status: DC | PRN
  Administered 2018-09-10: 30000 [IU] via INTRAVENOUS
  Administered 2018-09-10 (×2): 31000 [IU] via INTRAVENOUS

## 2018-09-10 MED ORDER — ONDANSETRON HCL 4 MG/2ML IJ SOLN
INTRAMUSCULAR | Status: DC | PRN
  Administered 2018-09-10: 4 mg via INTRAVENOUS

## 2018-09-10 MED ORDER — 0.9 % SODIUM CHLORIDE (POUR BTL) OPTIME
TOPICAL | Status: DC | PRN
  Administered 2018-09-10: 5000 mL
  Administered 2018-09-10 (×2): 1000 mL

## 2018-09-10 MED ORDER — THROMBIN (RECOMBINANT) 20000 UNITS EX SOLR
CUTANEOUS | Status: AC
  Filled 2018-09-10: qty 20000

## 2018-09-10 MED ORDER — METHYLPREDNISOLONE SODIUM SUCC 125 MG IJ SOLR
125.0000 mg | INTRAMUSCULAR | Status: AC
  Administered 2018-09-10: 125 mg via INTRAVENOUS
  Filled 2018-09-10: qty 2

## 2018-09-10 MED ORDER — BISACODYL 10 MG RE SUPP: 10.0000 mg | Freq: Every day | RECTAL | Status: DC

## 2018-09-10 MED ORDER — METOPROLOL TARTRATE 12.5 MG HALF TABLET: 12.5000 mg | ORAL_TABLET | Freq: Once | ORAL | Status: DC

## 2018-09-10 MED ORDER — METOPROLOL TARTRATE 12.5 MG HALF TABLET: 12.5000 mg | ORAL_TABLET | Freq: Two times a day (BID) | ORAL | Status: DC

## 2018-09-10 MED ORDER — SODIUM CHLORIDE 0.9% FLUSH: 10.0000 mL | INTRAVENOUS | Status: DC | PRN

## 2018-09-10 MED ORDER — CHLORHEXIDINE GLUCONATE 0.12 % MT SOLN
15.0000 mL | OROMUCOSAL | Status: DC
  Administered 2018-09-11: 15 mL via OROMUCOSAL

## 2018-09-10 MED ORDER — PROTAMINE SULFATE 10 MG/ML IV SOLN
INTRAVENOUS | Status: AC
  Filled 2018-09-10: qty 25

## 2018-09-10 SURGICAL SUPPLY — 89 items
ADAPTER CARDIO PERF ANTE/RETRO (ADAPTER) ×3 IMPLANT
ADAPTER DLP PERFUSION .25INX2I (MISCELLANEOUS) ×3 IMPLANT
ADAPTER MULTI PERFUSION 15 (ADAPTER) ×3 IMPLANT
APPLICATOR TIP COSEAL (VASCULAR PRODUCTS) ×12 IMPLANT
BLADE STERNUM SYSTEM 6 (BLADE) ×3 IMPLANT
BLADE SURG 15 STRL LF DISP TIS (BLADE) ×4 IMPLANT
BLADE SURG 15 STRL SS (BLADE) ×2
CANISTER SUCT 3000ML PPV (MISCELLANEOUS) ×3 IMPLANT
CANNULA AORTIC ROOT 9FR (CANNULA) ×3 IMPLANT
CANNULA ARTERIAL VENT 3/8 20FR (CANNULA) ×3 IMPLANT
CANNULA GUNDRY RCSP 15FR (MISCELLANEOUS) ×6 IMPLANT
CANNULA MC2 2 STG 36/46 NON-V (CANNULA) ×2 IMPLANT
CANNULA SUMP PERICARDIAL (CANNULA) ×3 IMPLANT
CANNULA VENOUS 2 STG 34/46 (CANNULA) ×1
CATH HEART VENT LEFT (CATHETERS) ×4 IMPLANT
CATH ROBINSON RED A/P 18FR (CATHETERS) ×9 IMPLANT
CATH THORACIC 28FR (CATHETERS) ×3 IMPLANT
CATH THORACIC 36FR (CATHETERS) ×3 IMPLANT
CATH THORACIC 36FR RT ANG (CATHETERS) ×3 IMPLANT
CAUTERY HIGH TEMP VAS (MISCELLANEOUS) ×3 IMPLANT
CAUTERY SURG HI TEMP FINE TIP (MISCELLANEOUS) ×3 IMPLANT
CONT SPEC 4OZ CLIKSEAL STRL BL (MISCELLANEOUS) ×9 IMPLANT
COVER SURGICAL LIGHT HANDLE (MISCELLANEOUS) ×6 IMPLANT
CRADLE DONUT ADULT HEAD (MISCELLANEOUS) ×3 IMPLANT
DRAPE SLUSH/WARMER DISC (DRAPES) ×3 IMPLANT
DRSG COVADERM 4X14 (GAUZE/BANDAGES/DRESSINGS) ×3 IMPLANT
ELECT CAUTERY BLADE 6.4 (BLADE) ×3 IMPLANT
ELECT REM PT RETURN 9FT ADLT (ELECTROSURGICAL) ×6
ELECTRODE REM PT RTRN 9FT ADLT (ELECTROSURGICAL) ×4 IMPLANT
FELT TEFLON 1X6 (MISCELLANEOUS) ×3 IMPLANT
GAUZE SPONGE 4X4 12PLY STRL (GAUZE/BANDAGES/DRESSINGS) ×3 IMPLANT
GAUZE SPONGE 4X4 12PLY STRL LF (GAUZE/BANDAGES/DRESSINGS) ×3 IMPLANT
GLOVE BIO SURGEON STRL SZ 6.5 (GLOVE) ×6 IMPLANT
GLOVE BIO SURGEON STRL SZ7 (GLOVE) IMPLANT
GLOVE BIO SURGEON STRL SZ7.5 (GLOVE) IMPLANT
GLOVE EUDERMIC 7 POWDERFREE (GLOVE) ×6 IMPLANT
GOWN STRL REUS W/ TWL LRG LVL3 (GOWN DISPOSABLE) ×20 IMPLANT
GOWN STRL REUS W/ TWL XL LVL3 (GOWN DISPOSABLE) ×2 IMPLANT
GOWN STRL REUS W/TWL LRG LVL3 (GOWN DISPOSABLE) ×10
GOWN STRL REUS W/TWL XL LVL3 (GOWN DISPOSABLE) ×1
GRAFT GELWEAVE VALSALVA 28CM (Prosthesis & Implant Heart) ×3 IMPLANT
GRAFT HEMASHIELD 28X40 (Vascular Products) ×3 IMPLANT
HEMOSTAT POWDER SURGIFOAM 1G (HEMOSTASIS) ×9 IMPLANT
HEMOSTAT SURGICEL 2X14 (HEMOSTASIS) ×6 IMPLANT
IV CATH 18GX1.25 SAFE RETR GRN (IV SOLUTION) ×3 IMPLANT
KIT BASIN OR (CUSTOM PROCEDURE TRAY) ×3 IMPLANT
KIT SUCTION CATH 14FR (SUCTIONS) ×3 IMPLANT
KIT TURNOVER KIT B (KITS) ×3 IMPLANT
LINE VENT (MISCELLANEOUS) ×3 IMPLANT
LOOP VESSEL SUPERMAXI WHITE (MISCELLANEOUS) ×3 IMPLANT
NS IRRIG 1000ML POUR BTL (IV SOLUTION) ×18 IMPLANT
PACK E OPEN HEART (SUTURE) ×3 IMPLANT
PACK OPEN HEART (CUSTOM PROCEDURE TRAY) ×3 IMPLANT
PAD ARMBOARD 7.5X6 YLW CONV (MISCELLANEOUS) ×6 IMPLANT
SEALANT SURG COSEAL 8ML (VASCULAR PRODUCTS) ×3 IMPLANT
SET CARDIOPLEGIA MPS 5001102 (MISCELLANEOUS) ×3 IMPLANT
SET VEIN GRAFT PERF (SET/KITS/TRAYS/PACK) ×3 IMPLANT
SIZER VASCULAR GRAFT LG 24-38 (SIZER) ×3 IMPLANT
SOLUTION ANTI FOG 6CC (MISCELLANEOUS) ×3 IMPLANT
SPONGE LAP 4X18 RFD (DISPOSABLE) ×15 IMPLANT
SUT BONE WAX W31G (SUTURE) ×3 IMPLANT
SUT ETHIBON 2 0 V 52N 30 (SUTURE) ×6 IMPLANT
SUT ETHIBOND 2 0 SH (SUTURE) ×3
SUT ETHIBOND 2 0 SH 36X2 (SUTURE) ×6 IMPLANT
SUT PROLENE 3 0 SH 1 (SUTURE) ×3 IMPLANT
SUT PROLENE 3 0 SH 48 (SUTURE) ×9 IMPLANT
SUT PROLENE 3 0 SH DA (SUTURE) ×6 IMPLANT
SUT PROLENE 3 0 SH1 36 (SUTURE) ×3 IMPLANT
SUT PROLENE 4 0 RB 1 (SUTURE) ×13
SUT PROLENE 4 0 SH DA (SUTURE) ×6 IMPLANT
SUT PROLENE 4-0 RB1 .5 CRCL 36 (SUTURE) ×26 IMPLANT
SUT PROLENE 5 0 C 1 36 (SUTURE) ×15 IMPLANT
SUT PROLENE 5 0 RB 2 (SUTURE) ×6 IMPLANT
SUT SILK 2 0 SH CR/8 (SUTURE) ×6 IMPLANT
SUT VIC AB 1 CTX 36 (SUTURE) ×4
SUT VIC AB 1 CTX36XBRD ANBCTR (SUTURE) ×8 IMPLANT
SYSTEM SAHARA CHEST DRAIN ATS (WOUND CARE) ×3 IMPLANT
TAPE CLOTH SOFT 2X10 (GAUZE/BANDAGES/DRESSINGS) ×3 IMPLANT
TAPE CLOTH SURG 4X10 WHT LF (GAUZE/BANDAGES/DRESSINGS) ×3 IMPLANT
TOWEL GREEN STERILE (TOWEL DISPOSABLE) ×3 IMPLANT
TOWEL GREEN STERILE FF (TOWEL DISPOSABLE) ×3 IMPLANT
TRAY FOLEY SLVR 16FR LF STAT (SET/KITS/TRAYS/PACK) ×3 IMPLANT
TUBE CONNECTING 12X1/4 (SUCTIONS) ×3 IMPLANT
TUBE SUCT INTRACARD DLP 20F (MISCELLANEOUS) ×3 IMPLANT
UNDERPAD 30X30 (UNDERPADS AND DIAPERS) ×3 IMPLANT
VALVE MAGNA EASE AORTIC 25MM (Prosthesis & Implant Heart) ×3 IMPLANT
VENT LEFT HEART 12002 (CATHETERS) ×6
WATER STERILE IRR 1000ML POUR (IV SOLUTION) ×6 IMPLANT
YANKAUER SUCT BULB TIP NO VENT (SUCTIONS) ×3 IMPLANT

## 2018-09-10 NOTE — Anesthesia Postprocedure Evaluation (Signed)
Anesthesia Post Note  Patient: Ronnie Ruiz  Procedure(s) Performed: BENTALL PROCEDURE USING HEMASHIELD 28, GELWEAVE VALSALVA , AND MAGNA EASE (N/A Chest) TRANSESOPHAGEAL ECHOCARDIOGRAM (TEE) (N/A )     Patient location during evaluation: SICU Anesthesia Type: General Level of consciousness: sedated Pain management: pain level controlled Vital Signs Assessment: post-procedure vital signs reviewed and stable Respiratory status: patient remains intubated per anesthesia plan Cardiovascular status: stable Postop Assessment: no apparent nausea or vomiting Anesthetic complications: no    Last Vitals:  Vitals:   09/10/18 0553 09/10/18 2015  BP: 133/73 (!) 92/46  Pulse: 60 82  Resp: 20 14  Temp: 36.7 C   SpO2: 99% 99%                  Shelton Silvas

## 2018-09-10 NOTE — Anesthesia Procedure Notes (Signed)
Central Venous Catheter Insertion Performed by: Cecile Hearing, MD, anesthesiologist Start/End5/21/2020 6:47 AM, 09/10/2018 6:52 AM Patient location: Pre-op. Preanesthetic checklist: patient identified, IV checked, site marked, risks and benefits discussed, surgical consent, monitors and equipment checked, pre-op evaluation, timeout performed and anesthesia consent Hand hygiene performed  and maximum sterile barriers used  Total catheter length 100. PA cath was placed.Swan type:thermodilution PA Cath depth:55 Procedure performed without using ultrasound guided technique. Attempts: 1 Patient tolerated the procedure well with no immediate complications.

## 2018-09-10 NOTE — Brief Op Note (Signed)
09/10/2018  8:51 AM  PATIENT:  Gwynneth Albright Lopezperez  53 y.o. male  PRE-OPERATIVE DIAGNOSIS:  SEVERE AS TAA  POST-OPERATIVE DIAGNOSIS:  SEVERE AS TAA  PROCEDURE:  Procedure(s): BENTALL PROCEDURE USING HEMASHIELD 28, GELWEAVE VALSALVA , AND MAGNA EASE (N/A) TRANSESOPHAGEAL ECHOCARDIOGRAM (TEE) (N/A)  SURGEON:  Surgeon(s) and Role:    * Bartle, Payton Doughty, MD - Primary  PHYSICIAN ASSISTANT: WAYNE GOLD PA-C  ANESTHESIA:   general  EBL:  2600 mL   BLOOD ADMINISTERED:PLATELETS, CRYO AND FFP  DRAINS: PLEURAL AND PERICARDIAL CHEST TUBES   LOCAL MEDICATIONS USED:  NONE  SPECIMEN:  Source of Specimen:  AORTIC VALVE AND PORTION OF PROXIMAL AORTA  DISPOSITION OF SPECIMEN:  PATHOLOGY  COUNTS:  YES  TOURNIQUET:  * No tourniquets in log *  DICTATION: .Dragon Dictation  PLAN OF CARE: Admit to inpatient   PATIENT DISPOSITION:  ICU - intubated and hemodynamically stable.   Delay start of Pharmacological VTE agent (>24hrs) due to surgical blood loss or risk of bleeding: yes

## 2018-09-10 NOTE — Op Note (Signed)
CARDIOVASCULAR SURGERY OPERATIVE NOTE  09/10/2018  Surgeon:  [name], MD  First Assistant: Gershon Crane,  PA-C   Preoperative Diagnosis:  Critical bicuspid aortic valve stenosis with aortic root and ascending aortic aneurysm   Postoperative Diagnosis:  Same   Procedure:  1. Median Sternotomy 2. Extracorporeal circulation 3.   Replacement of the ascending aorta (hemi-arch) using a 28 mm Hemashield graft under deep hypothermic circulatory arrest 4.   Biological Bentall Procedure using a 25 mm Edwards Magna-Ease pericardial valve and a 28 mm Gelweave Valsalva graft.  Anesthesia:  General Endotracheal   Clinical History/Surgical Indication:  This 53 year old gentleman has a bicuspid aortic valve with stage D, critical, symptomatic aortic stenosis with New York Heart Association class III symptoms of exertional fatigue and shortness of breath as well as substernal chest discomfort consistent with chronic diastolic congestive heart failure. I have personally reviewed his 2D echocardiogram and cardiac catheterization studies. His 2D echocardiogram shows a bicuspid aortic valve with severe calcification and thickening of the valve leaflets with restricted mobility. The mean gradient across aortic valve was 64 mmHg consistent with critical aortic stenosis. Left ventricular systolic function is normal. The ascending aorta was measured at 41.5 mm by echo. Cardiac catheterization shows no significant coronary disease. Aortic root injection does show some enlargement of the ascending aorta.Aortic valve replacement is clearly indicated in this patient for relief of his symptoms and to prevent progressive left ventricular deterioration and death. He does have some enlargement of the ascending aorta to 4.2 cm by CTA and a bicuspid valve. His descending aorta is 2.7 cm. He is 53 years old so I think it is reasonable to replace his aortic aneurysm to decrease the long term risk of aortic  enlargement and dissection. His dental problems have been evaluated by his dentist and he had several bad teeth extracted.  I discussed the option of bioprosthetic and mechanical aortic valve prostheses and the benefits and risk of both. I think a bioprosthetic valve would be the best option for him given his chronic arthritis with nonsteroidal anti-inflammatory use and alcohol intake. A bioprosthetic valve would have good durability in his age group and avoid the need for Coumadin with his associated risk. He and his wife are in agreement with that.   Preparation:  The patient was seen in the preoperative holding area and the correct patient, correct operation were confirmed with the patient after reviewing the medical record and catheterization. The consent was signed by me. Preoperative antibiotics were given. A pulmonary arterial line and radial arterial line were placed by the anesthesia team. The patient was taken back to the operating room and positioned supine on the operating room table. After being placed under general endotracheal anesthesia by the anesthesia team a foley catheter was placed. The neck, chest, abdomen, and both legs were prepped with betadine soap and solution and draped in the usual sterile manner. A surgical time-out was taken and the correct patient and operative procedure were confirmed with the nursing and anesthesia staff.  TEE:  Performed by Dr. Leslye Peer . This showed:  Left Ventricle Left ventricle cavity is normal. There is moderate concentric hypertrophy. Systolic function is normal with an ejection fraction of 55-65%. There are no obvious wall motion abnormalities.  Left Atrium Left atrium cavity is mildly dilated. No spontaneous echo contrast visualized. There is no patent foramen ovale visualized.  Aortic Valve The leaflets are moderately. Restricted leaflet separation. Trace regurgitation. There is severe stenosis.  Ascending Aorta The  ascending aorta is  moderately dilated. No dissection.  Mitral Valve Mitral valve structure is normal. There is mild regurgitation. There is no evidence of mitral valve stenosis.  Right Ventricle Right ventricle cavity appears normal. Systolic function is normal. Wall thickness is normal.  Right Atrium Right atrium cavity is normal.  Tricuspid Valve Tricuspid valve structure is normal. There is normal regurgitation. There is no evidence of tricuspid valve stenosis.  Pulmonic Valve Not well visualized.      Cardiopulmonary Bypass:  A median sternotomy was performed. The pericardium was opened in the midline. Right ventricular function appeared normal. The ascending aorta was of normal size and had no palpable plaque. There were no contraindications to aortic cannulation or cross-clamping. The patient was fully systemically heparinized and the ACT was maintained > 400 sec. The distal ascending aorta was cannulated with a 20 F aortic cannula for arterial inflow. Venous cannulation was performed via the right atrial appendage using a two-staged venous cannula. An antegrade cardioplegia cannula was inserted into the ascending aorta. I could not get a retrograde cannula to enter the coronary sinus and after multiple attempts decided to abort that. Aortic occlusion was performed with a single cross-clamp. Systemic cooling to 18 degrees Centigrade and topical cooling of the heart with iced saline were used. Hyperkalemic antegrade cold blood cardioplegia was used to induce diastolic arrest and was then given into the coronary ostia using a handheld cannula at about 20 minute intervals throughout the period of arrest to maintain myocardial temperature at or below 10 degrees centigrade. A temperature probe was inserted into the interventricular septum and an insulating pad was placed in the pericardium. CO2 was insufflated into the pericardium throughout the case to minimize intracardiac air.    Resection and grafting of ascending  aortic aneurysm:  The patient was placed on cardiopulmonary bypass and a left ventricular vent was placed via the right superior pulmonary vein. Systemic cooling was begun with a goal temperature of 21 degrees centigrade by bladder and rectal temperature probes. A retrograde cerebral perfusion cannula was placed into the SVC through a pursestring suture and the SVC was encircled with a silastic tape.  After 30 minutes of cooling the target temperature of 21 degrees centigrade was reached. Cerebral oximetry was 70% bilaterally. BIS was zero. The patient was given Propofol and 125 mg of Solumedrol. The head was packed in ice. The bed was placed in steep trendelenburg.  The aorta was cross-clamped and antegrade cardioplegia was given. Circulatory arrest was begun and the blood volume emptied into the venous reservoir. The cross clamp was removed from the aorta. Continuous retrograde cerebral perfusion was begun and the SVC occluded with the silastic tape. Cold blood direct coronary ostial cardioplegia was given and myocardial temperature dropped to 10 degrees centigrade. Additional doses were given at approximately 20 minute intervals throughout the period of circulatory arrest and cross-clamping. Complete diastolic arrest was maintained. The aortic cannula was removed. The aorta was transected just proximal to the innominate artery beveling the resection out along the undersurface of the aortic arch (Hemiarch replacement). The aortic diameter was measured at 28 mm here. A 28 mm x 10 mm Hemasheild Platinum vascular graft was prepared. ( Catalog # [id number], Lot #[id number]). It was anastomosed to the aortic arch in an end to end manner using 3-0 prolene continuous suture with a felt strip to reinforce the anastomisis. A light coating of CoSeal was applied to seal needle holes. The arterial end of the bypass circuit was then connected  to the 67mm side arm graft and circulation was slowly resumed. The tape was removed  from the SVC. The aortic graft was cross-clamped proximal to the side arm graft and full CPB support was resumed. Circulatory arrest time was 21 minutes. Retrograde cerebral perfusion time was 18 minutes.   Bentall Procedure:   The ascending aorta was mobilized from the right pulmonary artery and main PA. It was opened longitudinally and the valve inspected. It was a Type 0 true bicuspid valve with 2 cusps and 2 commissures. The valve leaflets were extremely calcified and fused in the commissures with a small central opening.  The right and left coronary arteries were removed from the aortic root with a button of aortic wall around the ostia. They were oriented at 180 degrees from each other.  They were retracted carefully out of the way with stay sutures to prevent rotation. The native valve was excised taking care to remove all particulate debri. The annulus was decalcified with rongeurs. The annulus was somewhat thin beneath the left coronary artery and anteriorly after the calcium was removed from the annulus. The annulus was sized and a 25 mm Edwards Magna-Ease pericardial valve was chosen. ( Model # [id number], Serial # [id number]). A series of pledgetted 2-0 Ethibond horizontal mattress sutures were placed around the annulus with the pledgets in a sub-annular position. A 30 mm Gelweave Valsalva graft was chosen ( Ref # [id number] ADP, Lot # [id number], SN [id number]). The proximal cuff was trimmed so that there were 3 rings remaining. The valve was placed in the graft so that the sewing cuff was adjacent to the proximal graft cuff. The commissural posts were lined up with the vertical marker sutures on the graft and the valve and graft were held in position using a 5-0 prolene suture at each commissure. The sutures were placed through the valve sewing ring followed by the proximal graft cuff. The valve was lowered into place and the sutures tied . The valve seated nicely.  Small openings were made in the graft  for the coronary anastomoses using a thermal cautery. Then the left and right coronary buttons were anastomosed to the graft in an end to side manner using continuous 5-0 prolene suture. A light coating of CoSeal was applied to each anastomosis for hemostasis. The two grafts were then cut to the appropriate length and anastomosed end to end using continuous 3-0 prolene suture. CoSeal was applied to seal the needle holes in the grafts. A vent cannula was placed into the graft to remove any air. Deairing maneuvers were performed and the bed placed in trendelenburg position.   Completion:   The patient was rewarmed to 37 degrees Centigrade. The crossclamp was removed with a time of 209 minutes. There was spontaneous return of sinus rhythm. The position of the grafts was satisfactory. The vascular anastomoses all appeared hemostatic. Two temporary epicardial pacing wires were placed on the right atrium and two on the right ventricle. The patient was weaned from CPB without difficulty on no inotropes.  CPB time was 270 minutes. Cardiac output was 5 LPM. TEE showed a normal functioning aortic valve prosthesis with no AI. There was unchanged mild MR. LV function appeared normal. Heparin was fully reversed with protamine and the venous cannulas removed. Unfortunately a few minutes later the patient started bleeding from the annular anastomosis anteriorly. It was necessary to give another full dose of heparin and after the ACT reached 300 sec the venous cannula was replaced and the patient was  placed back on bypass. I could not visualize the area of bleeding very well so the ascending aortic graft was cross-clamped and 800 cc of cold blood cardioplegia was given with arrest of the heart. The bleeding site was repaired with two 4-0 Prolene pledgetted horizontal mattress sutures. The cross-clamp was removed and there appeared to be adequate hemostasis. The patient was weaned from bypass and protamine was given. The venous  cannula was removed. Hemostasis appeared adequate. We checked coagulation lab tests and his platelet and fibrinogen levels were low with an INR of 1.8. Therefore we gave him FFP, platelets and cryoprecipitate. As these were getting started another significant bleeding site started at the annular anastomosis behind the left main coronary anastomosis. The annulus was weak there after removal of the calcium. I decided it would be safest to repair this on pump. The patient was given another dose of heparin and when a therapeutic ACT was obtained the venous cannula was replaced and we went back on bypass. The bleeding site appeared to be behind the left main anastomosis. The graft was again cross-clamped and 1L of cold blood cardioplegia was given with arrest of the heart. I thought that the only way I could see this area was to take down the left coronary anastomosis. Then the bleeding site at the annulus was repaired with a 4-0 prolene pledgetted horizontal mattress suture. The left coronary button was then re-anastomosed to the valsalva graft using continuous 5-0 prolene suture. The cross-clamp was removed.  There was return of sinus rhythm. The patient was weaned from bypass without difficulty. TEE showed normal LV systolic function and no AI, trivial unchanged MR. Protamine was given. He was coagulopathic and was given a total of 3 unit cryo, 4 FFP, 2 units of Platelets. He still appeared coagulopathic and I gave him NovoSeven 90 mcg/kg. Hemostasis was achieved. Mediastinal drainage tubes were placed. The sternum was closed with double #6 stainless steel wires. The fascia was closed with continuous # 1 vicryl suture. The subcutaneous tissue was closed with 2-0 vicryl continuous suture. The skin was closed with 3-0 vicryl subcuticular suture. All sponge, needle, and instrument counts were reported correct at the end of the case. Dry sterile dressings were placed over the incisions and around the chest tubes which  were connected to pleurevac suction. The patient was then transported to the surgical intensive care unit in stable condition.

## 2018-09-10 NOTE — Interval H&P Note (Signed)
History and Physical Interval Note:  09/10/2018 7:27 AM  Ronnie Ruiz  has presented today for surgery, with the diagnosis of SEVERE AS TAA.  The various methods of treatment have been discussed with the patient and family. After consideration of risks, benefits and other options for treatment, the patient has consented to  Procedure(s): BENTALL PROCEDURE (N/A) TRANSESOPHAGEAL ECHOCARDIOGRAM (TEE) (N/A) as a surgical intervention.  The patient's history has been reviewed, patient examined, no change in status, stable for surgery.  I have reviewed the patient's chart and labs.  Questions were answered to the patient's satisfaction.     [name]

## 2018-09-10 NOTE — Anesthesia Procedure Notes (Signed)
Arterial Line Insertion Start/End5/21/2020 6:45 AM, 09/10/2018 7:00 AM Performed by: Jodell Cipro, CRNA, CRNA  Patient location: Pre-op. Preanesthetic checklist: patient identified, IV checked, site marked, risks and benefits discussed, surgical consent, monitors and equipment checked, pre-op evaluation, timeout performed and anesthesia consent Lidocaine 1% used for infiltration and patient sedated Left, radial was placed Catheter size: 20 G Hand hygiene performed  and maximum sterile barriers used  Allen's test indicative of satisfactory collateral circulation Attempts: 1 Procedure performed without using ultrasound guided technique. Following insertion, dressing applied and Biopatch. Post procedure assessment: normal  Patient tolerated the procedure well with no immediate complications.

## 2018-09-10 NOTE — Anesthesia Procedure Notes (Signed)
Central Venous Catheter Insertion Performed by: Cecile Hearing, MD, anesthesiologist Start/End5/21/2020 6:37 AM, 09/10/2018 6:47 AM Patient location: Pre-op. Preanesthetic checklist: patient identified, IV checked, site marked, risks and benefits discussed, surgical consent, monitors and equipment checked, pre-op evaluation, timeout performed and anesthesia consent Lidocaine 1% used for infiltration and patient sedated Hand hygiene performed  and maximum sterile barriers used  Catheter size: 8.5 Fr Sheath introducer Procedure performed using ultrasound guided technique. Ultrasound Notes:anatomy identified, needle tip was noted to be adjacent to the nerve/plexus identified, no ultrasound evidence of intravascular and/or intraneural injection and image(s) printed for medical record Attempts: 1 Following insertion, line sutured, dressing applied and Biopatch. Post procedure assessment: blood return through all ports, free fluid flow and no air  Patient tolerated the procedure well with no immediate complications.

## 2018-09-10 NOTE — Progress Notes (Signed)
  Echocardiogram Echocardiogram Transesophageal has been performed.  Ronnie Ruiz 09/10/2018, 8:22 AM

## 2018-09-10 NOTE — Anesthesia Procedure Notes (Addendum)
Procedure Name: Intubation Date/Time: 09/10/2018 7:48 AM Performed by: Leonor Liv, CRNA Pre-anesthesia Checklist: Patient identified, Emergency Drugs available, Suction available and Patient being monitored Patient Re-evaluated:Patient Re-evaluated prior to induction Oxygen Delivery Method: Circle System Utilized Preoxygenation: Pre-oxygenation with 100% oxygen Induction Type: IV induction Ventilation: Mask ventilation without difficulty and Oral airway inserted - appropriate to patient size Laryngoscope Size: Mac and 4 Grade View: Grade II Tube type: Oral Tube size: 8.0 mm Number of attempts: 1 Airway Equipment and Method: Stylet and Oral airway Placement Confirmation: ETT inserted through vocal cords under direct vision,  positive ETCO2 and breath sounds checked- equal and bilateral Secured at: 23 cm Tube secured with: Tape Dental Injury: Teeth and Oropharynx as per pre-operative assessment

## 2018-09-10 NOTE — Transfer of Care (Signed)
Immediate Anesthesia Transfer of Care Note  Patient: Shivan Enerson [place]  Procedure(s) Performed: BENTALL PROCEDURE USING HEMASHIELD 28, GELWEAVE VALSALVA , AND MAGNA EASE (N/A Chest) TRANSESOPHAGEAL ECHOCARDIOGRAM (TEE) (N/A )  Patient Location: SICU  Anesthesia Type:General  Level of Consciousness: sedated and Patient remains intubated per anesthesia plan  Airway & Oxygen Therapy: Patient remains intubated per anesthesia plan and Patient placed on Ventilator (see vital sign flow sheet for setting)  Post-op Assessment: Report given to RN and Post -op Vital signs reviewed and stable  Post vital signs: Reviewed and stable  Last Vitals:  Vitals Value Taken Time  BP    Temp    Pulse    Resp    SpO2      Last Pain:  Vitals:   09/10/18 0559  TempSrc:   PainSc: 0-No pain      Patients Stated Pain Goal: 2 (09/10/18 0559)  Complications: No apparent anesthesia complications

## 2018-09-11 ENCOUNTER — Encounter (HOSPITAL_COMMUNITY): Payer: Self-pay | Admitting: Surgery

## 2018-09-11 ENCOUNTER — Inpatient Hospital Stay (HOSPITAL_COMMUNITY): Payer: Commercial Managed Care - PPO

## 2018-09-11 LAB — BPAM FFP
Blood Product Expiration Date: 202005262359
Blood Product Expiration Date: 202005262359
Blood Product Expiration Date: 202005262359
Blood Product Expiration Date: 202005262359
ISSUE DATE / TIME: 202005211341
ISSUE DATE / TIME: 202005211341
ISSUE DATE / TIME: 202005211742
ISSUE DATE / TIME: 202005211742
Unit Type and Rh: 1700
Unit Type and Rh: 7300
Unit Type and Rh: 7300
Unit Type and Rh: 7300

## 2018-09-11 LAB — GLUCOSE, CAPILLARY
Glucose-Capillary: 100 mg/dL — ABNORMAL HIGH (ref 70–99)
Glucose-Capillary: 109 mg/dL — ABNORMAL HIGH (ref 70–99)
Glucose-Capillary: 109 mg/dL — ABNORMAL HIGH (ref 70–99)
Glucose-Capillary: 112 mg/dL — ABNORMAL HIGH (ref 70–99)
Glucose-Capillary: 112 mg/dL — ABNORMAL HIGH (ref 70–99)
Glucose-Capillary: 115 mg/dL — ABNORMAL HIGH (ref 70–99)
Glucose-Capillary: 115 mg/dL — ABNORMAL HIGH (ref 70–99)
Glucose-Capillary: 117 mg/dL — ABNORMAL HIGH (ref 70–99)
Glucose-Capillary: 119 mg/dL — ABNORMAL HIGH (ref 70–99)
Glucose-Capillary: 120 mg/dL — ABNORMAL HIGH (ref 70–99)
Glucose-Capillary: 122 mg/dL — ABNORMAL HIGH (ref 70–99)
Glucose-Capillary: 124 mg/dL — ABNORMAL HIGH (ref 70–99)
Glucose-Capillary: 128 mg/dL — ABNORMAL HIGH (ref 70–99)
Glucose-Capillary: 134 mg/dL — ABNORMAL HIGH (ref 70–99)
Glucose-Capillary: 151 mg/dL — ABNORMAL HIGH (ref 70–99)
Glucose-Capillary: 158 mg/dL — ABNORMAL HIGH (ref 70–99)
Glucose-Capillary: 165 mg/dL — ABNORMAL HIGH (ref 70–99)
Glucose-Capillary: 178 mg/dL — ABNORMAL HIGH (ref 70–99)
Glucose-Capillary: 96 mg/dL (ref 70–99)

## 2018-09-11 LAB — BASIC METABOLIC PANEL
Anion gap: 11 (ref 5–15)
Anion gap: 11 (ref 5–15)
BUN: 10 mg/dL (ref 6–20)
BUN: 11 mg/dL (ref 6–20)
CO2: 23 mmol/L (ref 22–32)
CO2: 24 mmol/L (ref 22–32)
Calcium: 7.5 mg/dL — ABNORMAL LOW (ref 8.9–10.3)
Calcium: 7.8 mg/dL — ABNORMAL LOW (ref 8.9–10.3)
Chloride: 111 mmol/L (ref 98–111)
Chloride: 104 mmol/L (ref 98–111)
Creatinine, Ser: 0.87 mg/dL (ref 0.61–1.24)
Creatinine, Ser: 0.92 mg/dL (ref 0.61–1.24)
GFR calc Af Amer: >60 mL/min (ref >60)
GFR calc Af Amer: >60 mL/min (ref >60)
GFR calc non Af Amer: >60 mL/min (ref >60)
GFR calc non Af Amer: >60 mL/min (ref >60)
Glucose, Bld: 104 mg/dL — ABNORMAL HIGH (ref 70–99)
Glucose, Bld: 159 mg/dL — ABNORMAL HIGH (ref 70–99)
Potassium: 3.7 mmol/L (ref 3.5–5.1)
Potassium: 3.7 mmol/L (ref 3.5–5.1)
Sodium: 145 mmol/L (ref 135–145)
Sodium: 139 mmol/L (ref 135–145)

## 2018-09-11 LAB — CBC
HCT: 25.8 % — ABNORMAL LOW (ref 39.0–52.0)
HCT: 25.3 % — ABNORMAL LOW (ref 39.0–52.0)
Hemoglobin: 9.2 g/dL — ABNORMAL LOW (ref 13.0–17.0)
Hemoglobin: 8.7 g/dL — ABNORMAL LOW (ref 13.0–17.0)
MCH: 31.7 pg (ref 26.0–34.0)
MCH: 31.8 pg (ref 26.0–34.0)
MCHC: 35.7 g/dL (ref 30.0–36.0)
MCHC: 34.4 g/dL (ref 30.0–36.0)
MCV: 89 fL (ref 80.0–100.0)
MCV: 92.3 fL (ref 80.0–100.0)
Platelets: 134 10*3/uL — ABNORMAL LOW (ref 150–400)
Platelets: 107 10*3/uL — ABNORMAL LOW (ref 150–400)
RBC: 2.9 MIL/uL — ABNORMAL LOW (ref 4.22–5.81)
RBC: 2.74 MIL/uL — ABNORMAL LOW (ref 4.22–5.81)
RDW: 13.5 % (ref 11.5–15.5)
RDW: 14.5 % (ref 11.5–15.5)
WBC: 10.5 10*3/uL (ref 4.0–10.5)
WBC: 10.2 10*3/uL (ref 4.0–10.5)
nRBC: 0 % (ref 0.0–0.2)
nRBC: 0 % (ref 0.0–0.2)

## 2018-09-11 LAB — POCT I-STAT, CHEM 8
BUN: 11 mg/dL (ref 6–20)
Calcium, Ion: 1.1 mmol/L — ABNORMAL LOW (ref 1.15–1.40)
Chloride: 101 mmol/L (ref 98–111)
Creatinine, Ser: 0.8 mg/dL (ref 0.61–1.24)
Glucose, Bld: 160 mg/dL — ABNORMAL HIGH (ref 70–99)
HCT: 22 % — ABNORMAL LOW (ref 39.0–52.0)
Hemoglobin: 7.5 g/dL — ABNORMAL LOW (ref 13.0–17.0)
Potassium: 3.8 mmol/L (ref 3.5–5.1)
Sodium: 138 mmol/L (ref 135–145)
TCO2: 27 mmol/L (ref 22–32)

## 2018-09-11 LAB — BPAM PLATELET PHERESIS
Blood Product Expiration Date: 202005222359
Blood Product Expiration Date: 202005221712
Blood Product Expiration Date: 202005222359
ISSUE DATE / TIME: 202005211305
ISSUE DATE / TIME: 202005211707
ISSUE DATE / TIME: 202005211715
Unit Type and Rh: 5100
Unit Type and Rh: 6200
Unit Type and Rh: 6200

## 2018-09-11 LAB — PREPARE CRYOPRECIPITATE
Unit division: 0
Unit division: 0
Unit division: 0

## 2018-09-11 LAB — PREPARE FRESH FROZEN PLASMA
Unit division: 0
Unit division: 0
Unit division: 0

## 2018-09-11 LAB — POCT I-STAT 7, (LYTES, BLD GAS, ICA,H+H)
Acid-base deficit: 2 mmol/L (ref 0.0–2.0)
Acid-base deficit: 2 mmol/L (ref 0.0–2.0)
Bicarbonate: 22.9 mmol/L (ref 20.0–28.0)
Bicarbonate: 23.2 mmol/L (ref 20.0–28.0)
Calcium, Ion: 1.07 mmol/L — ABNORMAL LOW (ref 1.15–1.40)
Calcium, Ion: 1.14 mmol/L — ABNORMAL LOW (ref 1.15–1.40)
HCT: 23 % — ABNORMAL LOW (ref 39.0–52.0)
HCT: 23 % — ABNORMAL LOW (ref 39.0–52.0)
Hemoglobin: 7.8 g/dL — ABNORMAL LOW (ref 13.0–17.0)
Hemoglobin: 7.8 g/dL — ABNORMAL LOW (ref 13.0–17.0)
O2 Saturation: 96 %
O2 Saturation: 97 %
Patient temperature: 37.1
Patient temperature: 37.4
Potassium: 3.5 mmol/L (ref 3.5–5.1)
Potassium: 3.6 mmol/L (ref 3.5–5.1)
Sodium: 143 mmol/L (ref 135–145)
Sodium: 143 mmol/L (ref 135–145)
TCO2: 24 mmol/L (ref 22–32)
TCO2: 24 mmol/L (ref 22–32)
pCO2 arterial: 38.5 mmHg (ref 32.0–48.0)
pCO2 arterial: 39 mmHg (ref 32.0–48.0)
pH, Arterial: 7.383 (ref 7.350–7.450)
pH, Arterial: 7.383 (ref 7.350–7.450)
pO2, Arterial: 81 mmHg — ABNORMAL LOW (ref 83.0–108.0)
pO2, Arterial: 96 mmHg (ref 83.0–108.0)

## 2018-09-11 LAB — PREPARE PLATELET PHERESIS
Unit division: 0
Unit division: 0
Unit division: 0

## 2018-09-11 LAB — MAGNESIUM
Magnesium: 3 mg/dL — ABNORMAL HIGH (ref 1.7–2.4)
Magnesium: 2.4 mg/dL (ref 1.7–2.4)

## 2018-09-11 LAB — BPAM CRYOPRECIPITATE
Blood Product Expiration Date: 202005211910
Blood Product Expiration Date: 202005212105
Blood Product Expiration Date: 202005212105
ISSUE DATE / TIME: 202005211344
ISSUE DATE / TIME: 202005211741
ISSUE DATE / TIME: 202005211741
Unit Type and Rh: 6200
Unit Type and Rh: 5100
Unit Type and Rh: 5100

## 2018-09-11 MED ORDER — FUROSEMIDE 10 MG/ML IJ SOLN
40.0000 mg | Freq: Two times a day (BID) | INTRAMUSCULAR | Status: AC
  Administered 2018-09-11 (×2): 40 mg via INTRAVENOUS
  Filled 2018-09-11 (×2): qty 4

## 2018-09-11 MED ORDER — POTASSIUM CHLORIDE CRYS ER 20 MEQ PO TBCR
20.0000 meq | EXTENDED_RELEASE_TABLET | Freq: Two times a day (BID) | ORAL | Status: AC
  Administered 2018-09-11 (×2): 20 meq via ORAL
  Filled 2018-09-11 (×2): qty 1

## 2018-09-11 MED ORDER — METOPROLOL TARTRATE 25 MG/10 ML ORAL SUSPENSION
25.0000 mg | Freq: Two times a day (BID) | ORAL | Status: DC
  Filled 2018-09-11: qty 10

## 2018-09-11 MED ORDER — INSULIN DETEMIR 100 UNIT/ML ~~LOC~~ SOLN
15.0000 [IU] | Freq: Every day | SUBCUTANEOUS | Status: DC
  Administered 2018-09-12 – 2018-09-14 (×3): 15 [IU] via SUBCUTANEOUS
  Filled 2018-09-11 (×4): qty 0.15

## 2018-09-11 MED ORDER — LISINOPRIL 5 MG PO TABS
5.0000 mg | ORAL_TABLET | Freq: Every day | ORAL | Status: DC
  Administered 2018-09-11: 5 mg via ORAL
  Filled 2018-09-11: qty 1

## 2018-09-11 MED ORDER — POTASSIUM CHLORIDE 10 MEQ/50ML IV SOLN
10.0000 meq | INTRAVENOUS | Status: AC
  Administered 2018-09-11 (×3): 10 meq via INTRAVENOUS

## 2018-09-11 MED ORDER — POTASSIUM CHLORIDE 10 MEQ/50ML IV SOLN
10.0000 meq | INTRAVENOUS | Status: AC
  Administered 2018-09-11 (×3): 10 meq via INTRAVENOUS
  Filled 2018-09-11 (×3): qty 50

## 2018-09-11 MED ORDER — INSULIN DETEMIR 100 UNIT/ML ~~LOC~~ SOLN
20.0000 [IU] | Freq: Once | SUBCUTANEOUS | Status: AC
  Administered 2018-09-11: 20 [IU] via SUBCUTANEOUS
  Filled 2018-09-11: qty 0.2

## 2018-09-11 MED ORDER — INSULIN ASPART 100 UNIT/ML ~~LOC~~ SOLN
0.0000 [IU] | SUBCUTANEOUS | Status: DC
  Administered 2018-09-11 (×3): 2 [IU] via SUBCUTANEOUS
  Administered 2018-09-11: 4 [IU] via SUBCUTANEOUS
  Administered 2018-09-12 (×2): 2 [IU] via SUBCUTANEOUS

## 2018-09-11 MED ORDER — METOPROLOL TARTRATE 25 MG PO TABS
25.0000 mg | ORAL_TABLET | Freq: Two times a day (BID) | ORAL | Status: DC
  Administered 2018-09-11 – 2018-09-15 (×8): 25 mg via ORAL
  Filled 2018-09-11 (×8): qty 1

## 2018-09-11 MED FILL — Thrombin (Recombinant) For Soln 20000 Unit: CUTANEOUS | Qty: 1 | Status: AC

## 2018-09-11 NOTE — Progress Notes (Signed)
TCTS BRIEF SICU PROGRESS NOTE  1 Day Post-Op  S/P Procedure(s) (LRB): BENTALL PROCEDURE USING HEMASHIELD 28, GELWEAVE VALSALVA , AND MAGNA EASE (N/A) TRANSESOPHAGEAL ECHOCARDIOGRAM (TEE) (N/A)   Stable day NSR w/ stable BP Breathing comfortably on 2 L/min via Richardson UOP adequate Minimal chest tube output  Plan: Continue current plan  Purcell Nails, MD 09/11/2018 5:57 PM

## 2018-09-11 NOTE — Plan of Care (Signed)
  Problem: Clinical Measurements: Goal: Diagnostic test results will improve Outcome: Progressing Goal: Respiratory complications will improve Outcome: Progressing Goal: Cardiovascular complication will be avoided Outcome: Progressing   Problem: Pain Managment: Goal: General experience of comfort will improve Outcome: Progressing   Problem: Cardiac: Goal: Will achieve and/or maintain hemodynamic stability Outcome: Progressing   Problem: Clinical Measurements: Goal: Postoperative complications will be avoided or minimized Outcome: Progressing   Problem: Respiratory: Goal: Respiratory status will improve Outcome: Progressing   Problem: Urinary Elimination: Goal: Ability to achieve and maintain adequate renal perfusion and functioning will improve Outcome: Progressing

## 2018-09-11 NOTE — Progress Notes (Signed)
1 Day Post-Op Procedure(s) (LRB): BENTALL PROCEDURE USING HEMASHIELD 28, GELWEAVE VALSALVA , AND MAGNA EASE (N/A) TRANSESOPHAGEAL ECHOCARDIOGRAM (TEE) (N/A) Subjective: No complaints.  Objective: Vital signs in last 24 hours: Temp:  [98.8 F (37.1 C)-99.3 F (37.4 C)] 98.8 F (37.1 C) (05/22 0700) Pulse Rate:  [75-89] 84 (05/22 0700) Cardiac Rhythm: Normal sinus rhythm (05/22 0000) Resp:  [7-19] 19 (05/22 0700) BP: (77-110)/(46-82) 99/62 (05/22 0700) SpO2:  [93 %-99 %] 96 % (05/22 0700) Arterial Line BP: (78-126)/(29-61) 126/50 (05/22 0700) FiO2 (%):  [40 %-50 %] 40 % (05/21 2355) Weight:  [103.8 kg] 103.8 kg (05/22 0521)  Hemodynamic parameters for last 24 hours: PAP: (20-32)/(8-20) 27/14 CO:  [4.4 L/min-6.3 L/min] 5 L/min CI:  [2.1 L/min/m2-2.9 L/min/m2] 2.3 L/min/m2  Intake/Output from previous day: 05/21 0701 - 05/22 0700 In: 06237 [I.V.:5171.1; Blood:4349; IV Piggyback:1216.9] Out: 6960 [Urine:3860; Emesis/NG output:250; Blood:2600; Chest Tube:250] Intake/Output this shift: No intake/output data recorded.  General appearance: alert and cooperative Neurologic: intact Heart: regular rate and rhythm, S1, S2 normal, no murmur, click, rub or gallop Lungs: clear to auscultation bilaterally Extremities: edema mild Dressing dry  Lab Results: Recent Labs    09/10/18 2049  09/11/18 0239 09/11/18 0314  WBC 7.9  --   --  10.5  HGB 9.4*   < > 7.8* 9.2*  HCT 26.7*   < > 23.0* 25.8*  PLT 124*  --   --  134*   < > = values in this interval not displayed.   BMET:  Recent Labs    09/10/18 1926  09/11/18 0239 09/11/18 0314  NA 145   < > 143 145  K 3.9   < > 3.6 3.7  CL  --   --   --  111  CO2  --   --   --  23  GLUCOSE 96  --   --  104*  BUN  --   --   --  10  CREATININE  --   --   --  0.87  CALCIUM  --   --   --  7.5*   < > = values in this interval not displayed.    PT/INR:  Recent Labs    09/10/18 2049  LABPROT 10.9*  INR 0.8   ABG     Component Value Date/Time   PHART 7.383 09/11/2018 0239   HCO3 22.9 09/11/2018 0239   TCO2 24 09/11/2018 0239   ACIDBASEDEF 2.0 09/11/2018 0239   O2SAT 96.0 09/11/2018 0239   CBG (last 3)  Recent Labs    09/11/18 0238 09/11/18 0353 09/11/18 0505  GLUCAP 109* 112* 96   CXR: clear  ECG: NSR, LVH, prolonged QTc  Assessment/Plan: S/P Procedure(s) (LRB): BENTALL PROCEDURE USING HEMASHIELD 28, GELWEAVE VALSALVA , AND MAGNA EASE (N/A) TRANSESOPHAGEAL ECHOCARDIOGRAM (TEE) (N/A)  POD 1 Hemodynamically stable on low dose nipride for HTN. Will resume Lopressor and low dose lisinopril and wean off nipride.  Volume excess: diurese and replace K+  DM: glucose under control. Start levemir and SSI. Preop Hgb A1c was 7.4 so needs better control.  Postop coagulopathy corrected in OR. Chest tube output low. Will leave in today and remove in am.  OOB, IS, mobilize.   LOS: 1 day    [name] 09/11/2018

## 2018-09-11 NOTE — Procedures (Signed)
Extubation Procedure Note  Patient Details:   Name: Ronnie Ruiz DOB: 11-26-1965 MRN: [id number]   Airway Documentation:    Vent end date: 09/11/18 Vent end time: 0025   Evaluation  O2 sats: stable throughout Complications: No apparent complications Patient did tolerate procedure well. Bilateral Breath Sounds: Clear, Diminished   Yes  Patient tolerated rapid wean. NIF -36 and VC 1.27 L. Positive for cuff leak. Patient extubated to a 4 Lpm nasal cannula. No dyspnea or stridor noted. Patient instructed on the Incentive Spirometer achieving three times. RN at bedside. Patient resting comfortably.   Ancil Boozer 09/11/2018, 1:15 AM

## 2018-09-12 ENCOUNTER — Inpatient Hospital Stay (HOSPITAL_COMMUNITY): Payer: Commercial Managed Care - PPO

## 2018-09-12 LAB — GLUCOSE, CAPILLARY
Glucose-Capillary: 142 mg/dL — ABNORMAL HIGH (ref 70–99)
Glucose-Capillary: 159 mg/dL — ABNORMAL HIGH (ref 70–99)
Glucose-Capillary: 163 mg/dL — ABNORMAL HIGH (ref 70–99)
Glucose-Capillary: 158 mg/dL — ABNORMAL HIGH (ref 70–99)
Glucose-Capillary: 186 mg/dL — ABNORMAL HIGH (ref 70–99)

## 2018-09-12 LAB — CBC
HCT: 24.2 % — ABNORMAL LOW (ref 39.0–52.0)
Hemoglobin: 8.1 g/dL — ABNORMAL LOW (ref 13.0–17.0)
MCH: 31.4 pg (ref 26.0–34.0)
MCHC: 33.5 g/dL (ref 30.0–36.0)
MCV: 93.8 fL (ref 80.0–100.0)
Platelets: 86 10*3/uL — ABNORMAL LOW (ref 150–400)
RBC: 2.58 MIL/uL — ABNORMAL LOW (ref 4.22–5.81)
RDW: 14.2 % (ref 11.5–15.5)
WBC: 7.8 10*3/uL (ref 4.0–10.5)
nRBC: 0 % (ref 0.0–0.2)

## 2018-09-12 LAB — BASIC METABOLIC PANEL
Anion gap: 7 (ref 5–15)
BUN: 13 mg/dL (ref 6–20)
CO2: 25 mmol/L (ref 22–32)
Calcium: 7.7 mg/dL — ABNORMAL LOW (ref 8.9–10.3)
Chloride: 103 mmol/L (ref 98–111)
Creatinine, Ser: 0.86 mg/dL (ref 0.61–1.24)
GFR calc Af Amer: >60 mL/min (ref >60)
GFR calc non Af Amer: >60 mL/min (ref >60)
Glucose, Bld: 158 mg/dL — ABNORMAL HIGH (ref 70–99)
Potassium: 3.9 mmol/L (ref 3.5–5.1)
Sodium: 135 mmol/L (ref 135–145)

## 2018-09-12 MED ORDER — MOVING RIGHT ALONG BOOK
Freq: Once | Status: AC
  Administered 2018-09-12: 11:00:00
  Filled 2018-09-12: qty 1

## 2018-09-12 MED ORDER — FUROSEMIDE 10 MG/ML IJ SOLN
40.0000 mg | Freq: Two times a day (BID) | INTRAMUSCULAR | Status: AC
  Administered 2018-09-12 – 2018-09-13 (×2): 40 mg via INTRAVENOUS
  Filled 2018-09-12 (×2): qty 4

## 2018-09-12 MED ORDER — INSULIN ASPART 100 UNIT/ML ~~LOC~~ SOLN
0.0000 [IU] | Freq: Three times a day (TID) | SUBCUTANEOUS | Status: DC
  Administered 2018-09-12: 0 [IU] via SUBCUTANEOUS
  Administered 2018-09-12: 2 [IU] via SUBCUTANEOUS
  Administered 2018-09-12 – 2018-09-13 (×2): 4 [IU] via SUBCUTANEOUS
  Administered 2018-09-13: 2 [IU] via SUBCUTANEOUS
  Administered 2018-09-13: 4 [IU] via SUBCUTANEOUS
  Administered 2018-09-14: 2 [IU] via SUBCUTANEOUS
  Administered 2018-09-14: 4 [IU] via SUBCUTANEOUS
  Administered 2018-09-14 – 2018-09-15 (×3): 2 [IU] via SUBCUTANEOUS

## 2018-09-12 NOTE — Progress Notes (Signed)
TCTS BRIEF SICU PROGRESS NOTE  2 Days Post-Op  S/P Procedure(s) (LRB): BENTALL PROCEDURE USING HEMASHIELD 28, GELWEAVE VALSALVA , AND MAGNA EASE (N/A) TRANSESOPHAGEAL ECHOCARDIOGRAM (TEE) (N/A)   Stable day  Plan: Continue current plan  Purcell Nails, MD 09/12/2018 5:16 PM

## 2018-09-12 NOTE — Progress Notes (Signed)
Cardiac Rehab Advisory Cardiac Rehab Phase I is not seeing pts face to face at this time due to Covid 19 restrictions. Ambulation is occurring through nursing, PT, and mobility teams. We will help facilitate that process as needed. We are calling pts in their rooms and discussing education. We will then deliver education materials to pts RN for delivery to pt.    Contacted patient by phone and reviewed sternal precautions, wound care and s/sx's of infection, daily weights, and when to call physician, restrictions, heart healthy and low sodium diet and exercise guidelines. Provided information on viewing the recovering from [facility] video, which patient can view with wife. Will follow-up and reinforce education as necessary if patient not discharged prior to Tuesday 09/15/2018. Discussed Phase 2 cardiac rehab with patient and will send referral to program at [facility].  Artist Pais, MS, ACSM CEP [phone]

## 2018-09-12 NOTE — Progress Notes (Signed)
[address]       [place] [id number]             [phone]        CARDIOTHORACIC SURGERY PROGRESS NOTE   R2 Days Post-Op Procedure(s) (LRB): BENTALL PROCEDURE USING HEMASHIELD 28, GELWEAVE VALSALVA 28MM, AND MAGNA EASE 25MM (N/A) TRANSESOPHAGEAL ECHOCARDIOGRAM (TEE) (N/A)  Subjective: Feels sore in chest but improving.  Slept well.  No SOB  Objective: Vital signs: BP Readings from Last 1 Encounters:  09/12/18 111/70   Pulse Readings from Last 1 Encounters:  09/12/18 76   Resp Readings from Last 1 Encounters:  09/12/18 17   Temp Readings from Last 1 Encounters:  09/12/18 99.4 F (37.4 C) (Oral)    Hemodynamics: PAP: (17-27)/(5-8) 24/6 CO:  [5.8 L/min] 5.8 L/min CI:  [2.7 L/min/m2] 2.7 L/min/m2  Physical Exam:  Rhythm:   sinus  Breath sounds: clear  Heart sounds:  RRR  Incisions:  Dressings dry, intact  Abdomen:  Soft, non-distended, non-tender  Extremities:  Warm, well-perfused  Chest tubes:  low volume thin serosanguinous output, no air leak    Intake/Output from previous day: 05/22 0701 - 05/23 0700 In: 787.8 [P.O.:240; I.V.:196.3; IV Piggyback:351.4] Out: 2595 [Urine:2275; Chest Tube:320] Intake/Output this shift: Total I/O In: 10 [Other:10] Out: 70 [Urine:60; Chest Tube:10]  Lab Results:  CBC: Recent Labs    09/11/18 1632 09/11/18 1643 09/12/18 0409  WBC 10.2  --  7.8  HGB 8.7* 7.5* 8.1*  HCT 25.3* 22.0* 24.2*  PLT 107*  --  86*    BMET:  Recent Labs    09/11/18 1632 09/11/18 1643 09/12/18 0409  NA 139 138 135  K 3.7 3.8 3.9  CL 104 101 103  CO2 24  --  25  GLUCOSE 159* 160* 158*  BUN 11 11 13   CREATININE 0.92 0.80 0.86  CALCIUM 7.8*  --  7.7*     PT/INR:   Recent Labs    09/10/18 2049  LABPROT 10.9*  INR 0.8    CBG (last 3)  Recent Labs    09/11/18 2330 09/12/18 0329 09/12/18 0753  GLUCAP 165* 142* 159*    ABG    Component Value Date/Time   PHART 7.383 09/11/2018 0239   PCO2ART  38.5 09/11/2018 0239   PO2ART 81.0 (L) 09/11/2018 0239   HCO3 22.9 09/11/2018 0239   TCO2 27 09/11/2018 1643   ACIDBASEDEF 2.0 09/11/2018 0239   O2SAT 96.0 09/11/2018 0239    CXR: PORTABLE CHEST 1 VIEW  COMPARISON:  Chest x-ray 09/11/2018.  FINDINGS: Right IJ Cordis with tip terminating in the proximal superior vena cava. Previously noted Swan-Ganz catheter has been removed. Midline surgical drain, likely a mediastinal drain. Status post median sternotomy for aortic valve replacement with what appears to be a stented bioprosthesis. Lung volumes are normal. No consolidative airspace disease. No pleural effusions. No evidence of pulmonary edema. Mild enlargement of the cardiopericardial silhouette, similar to the recent prior examination.  IMPRESSION: 1. Support apparatus and postoperative changes, as above. 2. No radiographic evidence of acute cardiopulmonary disease.   Electronically Signed   By: Trudie Reedaniel  Entrikin M.D.   On: 09/12/2018 07:45  Assessment/Plan: S/P Procedure(s) (LRB): BENTALL PROCEDURE USING HEMASHIELD 28, GELWEAVE VALSALVA 28MM, AND MAGNA EASE 25MM (N/A) TRANSESOPHAGEAL ECHOCARDIOGRAM (TEE) (N/A)  Overall doing well POD2 Maintaining NSR w/ stable BP Breathing comfortably w/ O2 sats 98-100% on 2 L/min Expected post op acute blood loss anemia, Hgb 8.1 Expected post op  volume excess, weight reportedly 4 kg > preop, UOP adequate   Mobilize  D/C tubes  Diuresis  Routine care   Purcell Nails, MD 09/12/2018 9:59 AM

## 2018-09-13 ENCOUNTER — Inpatient Hospital Stay (HOSPITAL_COMMUNITY): Payer: Commercial Managed Care - PPO

## 2018-09-13 LAB — GLUCOSE, CAPILLARY
Glucose-Capillary: 132 mg/dL — ABNORMAL HIGH (ref 70–99)
Glucose-Capillary: 117 mg/dL — ABNORMAL HIGH (ref 70–99)
Glucose-Capillary: 154 mg/dL — ABNORMAL HIGH (ref 70–99)
Glucose-Capillary: 174 mg/dL — ABNORMAL HIGH (ref 70–99)
Glucose-Capillary: 185 mg/dL — ABNORMAL HIGH (ref 70–99)
Glucose-Capillary: 165 mg/dL — ABNORMAL HIGH (ref 70–99)

## 2018-09-13 LAB — BASIC METABOLIC PANEL
Anion gap: 8 (ref 5–15)
BUN: 9 mg/dL (ref 6–20)
CO2: 27 mmol/L (ref 22–32)
Calcium: 8 mg/dL — ABNORMAL LOW (ref 8.9–10.3)
Chloride: 101 mmol/L (ref 98–111)
Creatinine, Ser: 0.76 mg/dL (ref 0.61–1.24)
GFR calc Af Amer: >60 mL/min (ref >60)
GFR calc non Af Amer: >60 mL/min (ref >60)
Glucose, Bld: 129 mg/dL — ABNORMAL HIGH (ref 70–99)
Potassium: 3.6 mmol/L (ref 3.5–5.1)
Sodium: 136 mmol/L (ref 135–145)

## 2018-09-13 LAB — CBC
HCT: 23.4 % — ABNORMAL LOW (ref 39.0–52.0)
Hemoglobin: 7.7 g/dL — ABNORMAL LOW (ref 13.0–17.0)
MCH: 30.8 pg (ref 26.0–34.0)
MCHC: 32.9 g/dL (ref 30.0–36.0)
MCV: 93.6 fL (ref 80.0–100.0)
Platelets: 97 10*3/uL — ABNORMAL LOW (ref 150–400)
RBC: 2.5 MIL/uL — ABNORMAL LOW (ref 4.22–5.81)
RDW: 13.6 % (ref 11.5–15.5)
WBC: 7.2 10*3/uL (ref 4.0–10.5)
nRBC: 0.3 % — ABNORMAL HIGH (ref 0.0–0.2)

## 2018-09-13 MED ORDER — FE FUMARATE-B12-VIT C-FA-IFC PO CAPS
1.0000 | ORAL_CAPSULE | Freq: Three times a day (TID) | ORAL | Status: DC
  Administered 2018-09-13 – 2018-09-15 (×7): 1 via ORAL
  Filled 2018-09-13 (×7): qty 1

## 2018-09-13 MED ORDER — CHLORHEXIDINE GLUCONATE CLOTH 2 % EX PADS
6.0000 | MEDICATED_PAD | Freq: Every day | CUTANEOUS | Status: DC
  Administered 2018-09-13: 6 via TOPICAL

## 2018-09-13 MED ORDER — FUROSEMIDE 40 MG PO TABS
40.0000 mg | ORAL_TABLET | Freq: Two times a day (BID) | ORAL | Status: DC
  Administered 2018-09-13 – 2018-09-15 (×4): 40 mg via ORAL
  Filled 2018-09-13 (×4): qty 1

## 2018-09-13 MED ORDER — POTASSIUM CHLORIDE CRYS ER 20 MEQ PO TBCR
20.0000 meq | EXTENDED_RELEASE_TABLET | Freq: Two times a day (BID) | ORAL | Status: DC
  Administered 2018-09-13 – 2018-09-15 (×5): 20 meq via ORAL
  Filled 2018-09-13 (×5): qty 1

## 2018-09-13 NOTE — Progress Notes (Addendum)
      [address]       [name] [address]             [phone]        CARDIOTHORACIC SURGERY PROGRESS NOTE   R3 Days Post-Op Procedure(s) (LRB): BENTALL PROCEDURE USING HEMASHIELD 28, GELWEAVE VALSALVA , AND MAGNA EASE (N/A) TRANSESOPHAGEAL ECHOCARDIOGRAM (TEE) (N/A)  Subjective: Looks good.  Mild soreness.  No SOB.  Appetite improving.  Ambulating quite well  Objective: Vital signs: BP Readings from Last 1 Encounters:  09/13/18 114/65   Pulse Readings from Last 1 Encounters:  09/13/18 85   Resp Readings from Last 1 Encounters:  09/13/18 13   Temp Readings from Last 1 Encounters:  09/13/18 99.3 F (37.4 C) (Oral)    Hemodynamics:    Physical Exam:  Rhythm:   sinus  Breath sounds: clear  Heart sounds:  RRR  Incisions:  Dressing dry, intact  Abdomen:  Soft, non-distended, non-tender  Extremities:  Warm, well-perfused    Intake/Output from previous day: 05/23 0701 - 05/24 0700 In: 40  Out: 1985 [Urine:1935; Chest Tube:50] Intake/Output this shift: Total I/O In: -  Out: 650 [Urine:650]  Lab Results:  CBC: Recent Labs    09/12/18 0409 09/13/18 0326  WBC 7.8 7.2  HGB 8.1* 7.7*  HCT 24.2* 23.4*  PLT 86* 97*    BMET:  Recent Labs    09/12/18 0409 09/13/18 0326  NA 135 136  K 3.9 3.6  CL 103 101  CO2 25 27  GLUCOSE 158* 129*  BUN 13 9  CREATININE 0.86 0.76  CALCIUM 7.7* 8.0*     PT/INR:   Recent Labs    09/10/18 2049  LABPROT 10.9*  INR 0.8    CBG (last 3)  Recent Labs    09/12/18 2000 09/12/18 2249 09/13/18 0717  GLUCAP 186* 132* 117*    ABG    Component Value Date/Time   PHART 7.383 09/11/2018 0239   PCO2ART 38.5 09/11/2018 0239   PO2ART 81.0 (L) 09/11/2018 0239   HCO3 22.9 09/11/2018 0239   TCO2 27 09/11/2018 1643   ACIDBASEDEF 2.0 09/11/2018 0239   O2SAT 96.0 09/11/2018 0239    CXR: CHEST - 2 VIEW  COMPARISON:  Chest x-ray 09/12/2018.  FINDINGS: Previously noted right IJ  Cordis and mediastinal drain have been removed. Lung volumes are normal. No consolidative airspace disease. Small bilateral pleural effusions. No pneumothorax. No pulmonary nodule or mass noted. Pulmonary vasculature and the cardiomediastinal silhouette are within normal limits. Status post median sternotomy for aortic root replacement.  IMPRESSION: 1. Support apparatus and postoperative changes, as above. 2. Small bilateral pleural effusions.   Electronically Signed   By: Trudie Reed M.D.   On: 09/13/2018 08:10  Assessment/Plan: S/P Procedure(s) (LRB): BENTALL PROCEDURE USING HEMASHIELD 28, GELWEAVE VALSALVA , AND MAGNA EASE (N/A) TRANSESOPHAGEAL ECHOCARDIOGRAM (TEE) (N/A)  Overall doing well POD3 Maintaining NSR w/ stable BP Breathing comfortably w/ O2 sats 98-100% on room air Expected post op acute blood loss anemia, Hgb 7.7 Expected post op volume excess, weight down but still reportedly 3 kg > preop, UOP adequate Type II diabetes mellitus, excellent glycemic control   Mobilize  Diuresis  Watch anemia  Transfer 4E  Routine care  Purcell Nails, MD 09/13/2018 9:30 AM

## 2018-09-14 LAB — GLUCOSE, CAPILLARY
Glucose-Capillary: 124 mg/dL — ABNORMAL HIGH (ref 70–99)
Glucose-Capillary: 137 mg/dL — ABNORMAL HIGH (ref 70–99)
Glucose-Capillary: 154 mg/dL — ABNORMAL HIGH (ref 70–99)
Glucose-Capillary: 177 mg/dL — ABNORMAL HIGH (ref 70–99)

## 2018-09-14 MED ORDER — LISINOPRIL 5 MG PO TABS
5.0000 mg | ORAL_TABLET | Freq: Every day | ORAL | Status: DC
  Administered 2018-09-14 – 2018-09-15 (×2): 5 mg via ORAL
  Filled 2018-09-14 (×2): qty 1

## 2018-09-14 MED ORDER — METFORMIN HCL 500 MG PO TABS
500.0000 mg | ORAL_TABLET | Freq: Two times a day (BID) | ORAL | Status: DC
  Administered 2018-09-14 – 2018-09-15 (×3): 500 mg via ORAL
  Filled 2018-09-14 (×3): qty 1

## 2018-09-14 NOTE — Discharge Instructions (Signed)
Aortic Valve Replacement, Care After  Refer to this sheet in the next few weeks. These instructions provide you with information about caring for yourself after your procedure. Your health care provider may also give you more specific instructions. Your treatment has been planned according to current medical practices, but problems sometimes occur. Call your health care provider if you have any problems or questions after your procedure.  What can I expect after the procedure?  After the procedure, it is common to have:  · Pain around your incision area.  · A small amount of blood or clear fluid coming from your incision.  Follow these instructions at home:  Eating and drinking         · Follow instructions from your health care provider about eating or drinking restrictions.  ? Limit alcohol intake to no more than 1 drink per day for nonpregnant women and 2 drinks per day for men. One drink equals 12 oz of beer, 5 oz of wine, or 1½ oz of hard liquor.  ? Limit how much caffeine you drink. Caffeine can affect your heart's rate and rhythm.  · Drink enough fluid to keep your urine clear or pale yellow.  · Eat a heart-healthy diet. This should include plenty of fresh fruits and vegetables. If you eat meat, it should be lean cuts. Avoid foods that are:  ? High in salt, saturated fat, or sugar.  ? Canned or highly processed.  ? Fried.  Activity  · Return to your normal activities as told by your health care provider. Ask your health care provider what activities are safe for you.  · Exercise regularly once you have recovered, as told by your health care provider.  · Avoid sitting for more than 2 hours at a time without moving. Get up and move around at least once every 1-2 hours. This helps to prevent blood clots in the legs.  · Do not lift anything that is heavier than 10 lb (4.5 kg) until your health care provider approves.  · Avoid pushing or pulling things with your arms until your health care provider approves. This  includes pulling on handrails to help you climb stairs.  Incision care    · Follow instructions from your health care provider about how to take care of your incision. Make sure you:  ? Wash your hands with soap and water before you change your bandage (dressing). If soap and water are not available, use hand sanitizer.  ? Change your dressing as told by your health care provider.  ? Leave stitches (sutures), skin glue, or adhesive strips in place. These skin closures may need to stay in place for 2 weeks or longer. If adhesive strip edges start to loosen and curl up, you may trim the loose edges. Do not remove adhesive strips completely unless your health care provider tells you to do that.  · Check your incision area every day for signs of infection. Check for:  ? More redness, swelling, or pain.  ? More fluid or blood.  ? Warmth.  ? Pus or a bad smell.  Medicines  · Take over-the-counter and prescription medicines only as told by your health care provider.  · If you were prescribed an antibiotic medicine, take it as told by your health care provider. Do not stop taking the antibiotic even if you start to feel better.  Travel  · Avoid airplane travel for as long as told by your health care provider.  · When   your health care provider approves.  Do not drive or operate heavy machinery while taking prescription pain medicine. Lifestyle  Do not use any tobacco products, such as cigarettes, chewing tobacco, or e-cigarettes. If you need help quitting, ask your health care provider.  Resume sexual activity as told by your health care provider. Do not use medicines for erectile dysfunction unless your health care provider approves, if this applies.  Work with your health care provider to keep your  blood pressure and cholesterol under control, and to manage any other heart conditions that you have.  Maintain a healthy weight. General instructions  Do not take baths, swim, or use a hot tub until your health care provider approves. You may take a shower  Do not strain to have a bowel movement.  Avoid crossing your legs while sitting down.  Check your temperature every day for a fever. A fever may be a sign of infection.  If you are a woman and you plan to become pregnant, talk with your health care provider before you become pregnant.  Wear compression stockings if your health care provider instructs you to do this. These stockings help to prevent blood clots and reduce swelling in your legs.  Tell all health care providers who care for you that you have an artificial (prosthetic) aortic valve. If you have or have had heart disease or endocarditis, tell all health care providers about these conditions as well.  Keep all follow-up visits as told by your health care provider. This is important. Contact a health care provider if:  You develop a skin rash.  You experience sudden, unexplained changes in your weight.  You have more redness, swelling, or pain around your incision.  You have more fluid or blood coming from your incision.  Your incision feels warm to the touch.  You have pus or a bad smell coming from your incision.  You have a fever. Get help right away if:  You develop chest pain that is different from the pain coming from your incision.  You develop shortness of breath or difficulty breathing.  You start to feel light-headed. These symptoms may represent a serious problem that is an emergency. Do not wait to see if the symptoms will go away. Get medical help right away. Call your local emergency services (911 in the [place] Do not drive yourself to the hospital. This information is not intended to replace advice given to you by your health care provider. Make  sure you discuss any questions you have with your health care provider. Document Released: 10/25/2004 Document Revised: 08/08/2016 Document Reviewed: 03/12/2015 Elsevier Interactive Patient Education  2019 [employer].

## 2018-09-14 NOTE — Progress Notes (Addendum)
[address]       [name] [address]             [phone]      4 Days Post-Op Procedure(s) (LRB): BENTALL PROCEDURE USING HEMASHIELD 28, GELWEAVE VALSALVA , AND MAGNA EASE (N/A) TRANSESOPHAGEAL ECHOCARDIOGRAM (TEE) (N/A) Subjective: Looks and feels pretty well No specific c/o  Objective: Vital signs in last 24 hours: Temp:  [98.2 F (36.8 C)-99.2 F (37.3 C)] 98.6 F (37 C) (05/25 0758) Pulse Rate:  [75-89] 89 (05/25 0758) Cardiac Rhythm: Normal sinus rhythm (05/25 0700) Resp:  [11-19] 18 (05/25 0758) BP: (113-150)/(59-73) 150/62 (05/25 0758) SpO2:  [95 %-100 %] 100 % (05/25 0758) Weight:  [99.3 kg] 99.3 kg (05/25 0342)  Hemodynamic parameters for last 24 hours:    Intake/Output from previous day: 05/24 0701 - 05/25 0700 In: 220 [P.O.:220] Out: 1550 [Urine:1550] Intake/Output this shift: Total I/O In: 120 [P.O.:120] Out: -   General appearance: alert, cooperative and no distress Heart: regular rate and rhythm and soft systolic murmur Lungs: dim left>right base Abdomen: benign Extremities: no significant edema Wound: incis healing well  Lab Results: Recent Labs    09/12/18 0409 09/13/18 0326  WBC 7.8 7.2  HGB 8.1* 7.7*  HCT 24.2* 23.4*  PLT 86* 97*   BMET:  Recent Labs    09/12/18 0409 09/13/18 0326  NA 135 136  K 3.9 3.6  CL 103 101  CO2 25 27  GLUCOSE 158* 129*  BUN 13 9  CREATININE 0.86 0.76  CALCIUM 7.7* 8.0*    PT/INR: No results for input(s): LABPROT, INR in the last 72 hours. ABG    Component Value Date/Time   PHART 7.383 09/11/2018 0239   HCO3 22.9 09/11/2018 0239   TCO2 27 09/11/2018 1643   ACIDBASEDEF 2.0 09/11/2018 0239   O2SAT 96.0 09/11/2018 0239   CBG (last 3)  Recent Labs    09/13/18 1741 09/13/18 2123 09/14/18 0619  GLUCAP 185* 165* 124*    Meds Scheduled Meds: . acetaminophen  1,000 mg Oral Q6H  . aspirin EC  325 mg Oral Daily  . bisacodyl  10 mg Oral Daily   Or  .  bisacodyl  10 mg Rectal Daily  . Chlorhexidine Gluconate Cloth  6 each Topical Daily  . docusate sodium  200 mg Oral Daily  . ferrous fumarate-b12-vitamic C-folic acid  1 capsule Oral TID PC  . furosemide  40 mg Oral BID  . insulin aspart  0-24 Units Subcutaneous TID AC & HS  . insulin detemir  15 Units Subcutaneous Daily  . metoprolol tartrate  25 mg Oral BID  . pantoprazole  40 mg Oral Daily  . potassium chloride  20 mEq Oral BID  . sodium chloride flush  10-40 mL Intracatheter Q12H  . sodium chloride flush  3 mL Intravenous Q12H   Continuous Infusions: . sodium chloride Stopped (09/11/18 0700)  . lactated ringers     PRN Meds:.metoprolol tartrate, morphine injection, ondansetron (ZOFRAN) IV, oxyCODONE, sodium chloride flush, sodium chloride flush, traMADol  Xrays Dg Chest 2 View  Result Date: 09/13/2018 CLINICAL DATA:  53 year old male with history of Bentall procedure. EXAM: CHEST - 2 VIEW COMPARISON:  Chest x-ray 09/12/2018. FINDINGS: Previously noted right IJ Cordis and mediastinal drain have been removed. Lung volumes are normal. No consolidative airspace disease. Small bilateral pleural effusions. No pneumothorax. No pulmonary nodule or mass noted. Pulmonary vasculature and the cardiomediastinal silhouette are within normal limits.  Status post median sternotomy for aortic root replacement. IMPRESSION: 1. Support apparatus and postoperative changes, as above. 2. Small bilateral pleural effusions. Electronically Signed   By: Trudie Reedaniel  Entrikin M.D.   On: 09/13/2018 08:10    Assessment/Plan: S/P Procedure(s) (LRB): BENTALL PROCEDURE USING HEMASHIELD 28, GELWEAVE VALSALVA 28MM, AND MAGNA EASE 25MM (N/A) TRANSESOPHAGEAL ECHOCARDIOGRAM (TEE) (N/A)  1 conts to do well POD#3 2 BP mostly well controlled with occas hypertensive reading, He was on a low dose ACE-I(lisinorpil 5 mg), renal fxn is normal- will resume. Maintaining sinus rhythm- will d/c epw's today 3 will resume metformin  (home dose) and stop insulin at this time and monitor, Hg A1c 7.4 so will need more aggressive lifestyle/diet management 4 Anemia is pretty stable , cont Iron supplement- hopefully can avoid further transfusions 5 good uop, wt about 1 kg over preop, reduce lasix soon for volume overload 6 cont pulm toilet and rehab 7 poss d/c in am  LOS: 4 days    Rowe ClackWayne E Gold [facility] 09/14/2018 Pager [phone]  I have seen and examined the patient and agree with the assessment and plan as outlined.  Purcell Nailslarence H Elmo Rio, MD 09/14/2018 10:02 AM

## 2018-09-14 NOTE — Discharge Summary (Signed)
Physician Discharge Summary  Patient ID: Ronnie Ruiz MRN: [id number] DOB/AGE: 54-05-1965 54 y.o.  Admit date: 09/10/2018 Discharge date: 09/15/2018  Admission Diagnoses: Critical aortic stenosis  Discharge Diagnoses:  Active Problems:   Critical aortic valve stenosis mean gradient 64 mmHg,   Patient Active Problem List   Diagnosis Date Noted  . Ascending aortic aneurysm (HCC) 41 mm as measured by echo 07/02/2018  . Bicuspid aortic valve 06/25/2018  . Critical aortic valve stenosis mean gradient 64 mmHg, 06/25/2018  . Diabetes mellitus type 2, controlled (HCC) 06/24/2018  . GERD (gastroesophageal reflux disease) 06/24/2018  . Hyperlipidemia 06/24/2018  . Essential hypertension 06/24/2018  . Alcoholism (HCC) 06/24/2018  . Heart murmur 06/24/2018  . Atherosclerotic heart disease 06/24/2018    Discharged Condition: good  Hospital Course:  The patient was admitted electively and on 09/10/2018 he was taken to the operating room where he underwent the below described procedure.  He tolerated it well and was taken to the surgical intensive care unit in stable condition.  He did require multiple agent transfusion for intraoperative coagulopathy.  Postoperative hospital course:  The patient is overall progressed well.  He has maintained stable hemodynamics and normal sinus rhythm.  He has had some hypertension and his lisinopril and beta-blocker have both been restarted.  He does have an expected acute blood loss anemia and values have stabilized.  Most recent hemoglobin and hematocrit are 8.9 and 26.2 respectively.  He did have a postoperative thrombocytopenia but this has resolved and platelet count is now in the normal range.  He has been started on iron supplement.  He did have some expected postoperative volume overload which is responded well to diuretics.  He is now below his preop weight and has no significant evidence of ongoing volume overload.  Blood sugars have been under  adequate control and he has been resumed on his preop metformin. Oxygen has been weaned and he maintains good saturations on room air.  Incisions are noted to be healing well without evidence of infection.  He is tolerating routine ambulation and cardiac rehab.  At the time of discharge the patient was felt to be quite stable.   Consults: None  Significant Diagnostic Studies: routine post-op serial labs and CXR's   Treatments: surgery:  CARDIOVASCULAR SURGERY OPERATIVE NOTE  09/10/2018  Surgeon:  [name], MD  First Assistant: Gershon Crane,  PA-C   Preoperative Diagnosis:  Critical bicuspid aortic valve stenosis with aortic root and ascending aortic aneurysm   Postoperative Diagnosis:  Same   Procedure:  1. Median Sternotomy 2. Extracorporeal circulation 3.   Replacement of the ascending aorta (hemi-arch) using a 28 mm Hemashield graft under deep hypothermic circulatory arrest 4.   Biological Bentall Procedure using a 25 mm Edwards Magna-Ease pericardial valve and a 28 mm Gelweave Valsalva graft.  Anesthesia:  General Endotracheal  Discharge Exam: Blood pressure 115/69, pulse 88, temperature 98.2 F (36.8 C), temperature source Oral, resp. rate 13, height 5' 8.5" (1.74 m), weight 96.4 kg, SpO2 99 %.  General appearance: alert, cooperative and no distress Heart: regular rate and rhythm and soft systolic murmur Lungs: mildly dim in bases Abdomen: benign Extremities: no edema Wound: incis healing well Disposition: Discharge disposition: 01-Home or Self Care       Discharge Instructions    Amb Referral to Cardiac Rehabilitation   Complete by:  As directed    Referral sent to cardiac rehab program at [facility].   Diagnosis:  Valve Replacement  Valve:  Aortic   After initial evaluation and assessments completed: Virtual Based Care may be provided alone or in conjunction with Phase 2 Cardiac Rehab based on patient barriers.:  Yes   Discharge  patient   Complete by:  As directed    Discharge disposition:  01-Home or Self Care   Discharge patient date:  09/15/2018     Allergies as of 09/15/2018   No Known Allergies     Medication List    STOP taking these medications   ibuprofen 800 MG tablet Commonly known as:  ADVIL   nitroGLYCERIN 0.4 MG SL tablet Commonly known as:  NITROSTAT     TAKE these medications   aspirin 325 MG EC tablet Take 1 tablet (325 mg total) by mouth daily. What changed:    medication strength  how much to take  when to take this   esomeprazole 20 MG capsule Commonly known as:  NEXIUM Take 20 mg by mouth every evening.   ferrous fumarate-b12-vitamic C-folic acid capsule Commonly known as:  TRINSICON / FOLTRIN Take 1 capsule by mouth 2 (two) times daily after a meal.   Fish Oil 1000 MG Caps Take 1,000 mg by mouth 2 (two) times daily.   lisinopril 10 MG tablet Commonly known as:  ZESTRIL Take 0.5 tablets (5 mg total) by mouth daily.   metFORMIN 500 MG tablet Commonly known as:  GLUCOPHAGE Take 500 mg by mouth 2 (two) times daily with a meal.   metoprolol succinate 50 MG 24 hr tablet Commonly known as:  TOPROL-XL Take 50 mg by mouth daily. Take with or immediately following a meal.   oxyCODONE 5 MG immediate release tablet Commonly known as:  Oxy IR/ROXICODONE Take 1-2 tablets (5-10 mg total) by mouth every 6 (six) hours as needed for up to 7 days for severe pain.   simvastatin 40 MG tablet Commonly known as:  ZOCOR Take 40 mg by mouth daily.      Follow-up Information    Alleen BorneBartle, Bryan K, MD Follow up.   Specialty:  Cardiothoracic Surgery Why:  Please see discharge paperwork for follow-up appointments to see the surgeon and nurse for suture removal.  [facility]hour prior to seeing Dr. Laneta SimmersBartle.  It is in the same office complex. Contact information: [address] [place] [address] [phone]        Georgeanna LeaKrasowski,  Robert J, MD Follow up.   Specialty:  Cardiology Why:  Please contact cardiology office to arrange a 2-week follow-up appointment. Contact information: [address] [place] [address] [phone]         The patient has been discharged on:   1.Beta Blocker:  Yes [  y ]                              No   [   ]                              If No, reason:  2.Ace Inhibitor/ARB: Yes [ y  ]                                     No  [    ]  If No, reason:  3.Statin:   Yes [ y  ]                  No  [   ]                  If No, reason:  4.Ecasa:  Yes  [ y  ]                  No   [   ]                  If No, reason:  Signed: Rowe Clack 09/15/2018, 9:35 AM

## 2018-09-15 LAB — CBC
HCT: 26.2 % — ABNORMAL LOW (ref 39.0–52.0)
Hemoglobin: 8.9 g/dL — ABNORMAL LOW (ref 13.0–17.0)
MCH: 31.4 pg (ref 26.0–34.0)
MCHC: 34 g/dL (ref 30.0–36.0)
MCV: 92.6 fL (ref 80.0–100.0)
Platelets: 211 10*3/uL (ref 150–400)
RBC: 2.83 MIL/uL — ABNORMAL LOW (ref 4.22–5.81)
RDW: 13 % (ref 11.5–15.5)
WBC: 7.4 10*3/uL (ref 4.0–10.5)
nRBC: 0 % (ref 0.0–0.2)

## 2018-09-15 LAB — BPAM RBC
Blood Product Expiration Date: 202005312359
Blood Product Expiration Date: 202005312359
Blood Product Expiration Date: 202006012359
Blood Product Expiration Date: 202006042359
ISSUE DATE / TIME: 202005211504
ISSUE DATE / TIME: 202005211504
ISSUE DATE / TIME: 202005211824
ISSUE DATE / TIME: 202005250915
Unit Type and Rh: 7300
Unit Type and Rh: 7300
Unit Type and Rh: 7300
Unit Type and Rh: 7300

## 2018-09-15 LAB — TYPE AND SCREEN
ABO/RH(D): B POS
Antibody Screen: NEGATIVE
Unit division: 0
Unit division: 0
Unit division: 0
Unit division: 0

## 2018-09-15 LAB — GLUCOSE, CAPILLARY: Glucose-Capillary: 136 mg/dL — ABNORMAL HIGH (ref 70–99)

## 2018-09-15 MED ORDER — OXYCODONE HCL 5 MG PO TABS: 5.0000 mg | ORAL_TABLET | Freq: Four times a day (QID) | ORAL | 0 refills | Status: DC | PRN

## 2018-09-15 MED ORDER — ASPIRIN 325 MG PO TBEC: 325.0000 mg | DELAYED_RELEASE_TABLET | Freq: Every day | ORAL | Status: AC

## 2018-09-15 MED ORDER — FE FUMARATE-B12-VIT C-FA-IFC PO CAPS: 1.0000 | ORAL_CAPSULE | Freq: Two times a day (BID) | ORAL | 1 refills | Status: DC

## 2018-09-15 MED FILL — Mannitol IV Soln 20%: INTRAVENOUS | Qty: 500 | Status: AC

## 2018-09-15 MED FILL — Sodium Bicarbonate IV Soln 8.4%: INTRAVENOUS | Qty: 50 | Status: AC

## 2018-09-15 MED FILL — Heparin Sodium (Porcine) Inj 1000 Unit/ML: INTRAMUSCULAR | Qty: 30 | Status: AC

## 2018-09-15 MED FILL — Sodium Chloride IV Soln 0.9%: INTRAVENOUS | Qty: 3000 | Status: AC

## 2018-09-15 MED FILL — Heparin Sodium (Porcine) Inj 1000 Unit/ML: INTRAMUSCULAR | Qty: 40 | Status: AC

## 2018-09-15 MED FILL — Lidocaine HCl(Cardiac) IV PF Soln Pref Syr 100 MG/5ML (2%): INTRAVENOUS | Qty: 15 | Status: AC

## 2018-09-15 MED FILL — Potassium Chloride Inj 2 mEq/ML: INTRAVENOUS | Qty: 20 | Status: AC

## 2018-09-15 MED FILL — Electrolyte-R (PH 7.4) Solution: INTRAVENOUS | Qty: 6000 | Status: AC

## 2018-09-15 NOTE — Progress Notes (Addendum)
[address]       [name] [address]             [phone]      5 Days Post-Op Procedure(s) (LRB): BENTALL PROCEDURE USING HEMASHIELD 28, GELWEAVE VALSALVA , AND MAGNA EASE (N/A) TRANSESOPHAGEAL ECHOCARDIOGRAM (TEE) (N/A) Subjective: Feels well, no new c/o  Objective: Vital signs in last 24 hours: Temp:  [98.4 F (36.9 C)-99.7 F (37.6 C)] 98.4 F (36.9 C) (05/26 0515) Pulse Rate:  [80-97] 80 (05/26 0515) Cardiac Rhythm: Normal sinus rhythm (05/26 0700) Resp:  [10-21] 14 (05/26 0515) BP: (104-150)/(61-77) 111/66 (05/26 0515) SpO2:  [96 %-100 %] 96 % (05/26 0515) Weight:  [96.4 kg] 96.4 kg (05/26 0515)  Hemodynamic parameters for last 24 hours:    Intake/Output from previous day: 05/25 0701 - 05/26 0700 In: 600 [P.O.:600] Out: -  Intake/Output this shift: No intake/output data recorded.  General appearance: alert, cooperative and no distress Heart: regular rate and rhythm and soft systolic murmur Lungs: mildly dim in bases Abdomen: benign Extremities: no edema Wound: incis healing well  Lab Results: Recent Labs    09/13/18 0326 09/15/18 0337  WBC 7.2 7.4  HGB 7.7* 8.9*  HCT 23.4* 26.2*  PLT 97* 211   BMET:  Recent Labs    09/13/18 0326  NA 136  K 3.6  CL 101  CO2 27  GLUCOSE 129*  BUN 9  CREATININE 0.76  CALCIUM 8.0*    PT/INR: No results for input(s): LABPROT, INR in the last 72 hours. ABG    Component Value Date/Time   PHART 7.383 09/11/2018 0239   HCO3 22.9 09/11/2018 0239   TCO2 27 09/11/2018 1643   ACIDBASEDEF 2.0 09/11/2018 0239   O2SAT 96.0 09/11/2018 0239   CBG (last 3)  Recent Labs    09/14/18 1704 09/14/18 2110 09/15/18 0618  GLUCAP 154* 177* 136*    Meds Scheduled Meds: . acetaminophen  1,000 mg Oral Q6H  . aspirin EC  325 mg Oral Daily  . bisacodyl  10 mg Oral Daily   Or  . bisacodyl  10 mg Rectal Daily  . docusate sodium  200 mg Oral Daily  . ferrous fumarate-b12-vitamic  C-folic acid  1 capsule Oral TID PC  . furosemide  40 mg Oral BID  . insulin aspart  0-24 Units Subcutaneous TID AC & HS  . insulin detemir  15 Units Subcutaneous Daily  . lisinopril  5 mg Oral Daily  . metFORMIN  500 mg Oral BID WC  . metoprolol tartrate  25 mg Oral BID  . pantoprazole  40 mg Oral Daily  . potassium chloride  20 mEq Oral BID  . sodium chloride flush  3 mL Intravenous Q12H   Continuous Infusions: . sodium chloride Stopped (09/11/18 0700)  . lactated ringers     PRN Meds:.metoprolol tartrate, morphine injection, ondansetron (ZOFRAN) IV, oxyCODONE, sodium chloride flush, traMADol  Xrays No results found.  Assessment/Plan: S/P Procedure(s) (LRB): BENTALL PROCEDURE USING HEMASHIELD 28, GELWEAVE VALSALVA , AND MAGNA EASE (N/A) TRANSESOPHAGEAL ECHOCARDIOGRAM (TEE) (N/A)  1 conts to do well 2 hemodyn stable in sinus rhythm, BP well controlled 3 sugars controlled- now back on metformin, discussed long term diet and lifestyle management 4 H/H improved, will nor require transfusion at this time, cont iron 5 volume overload appears resolved, will d/c diuretics 6 appears stable for discharge  LOS: 5 days    Rowe Clack [facility] 09/15/2018 Pager [phone]  Chart reviewed, patient examined, agree with above. He looks good overall and has been walking independently. I think he can go home today.

## 2018-09-15 NOTE — Progress Notes (Signed)
D/C instructions given to pt. Wound care reviewed. All questions answered. Iv removed, clean and intact. Wife to escort pt home.  Versie Starks, RN

## 2018-09-21 ENCOUNTER — Other Ambulatory Visit: Payer: Self-pay

## 2018-09-22 ENCOUNTER — Encounter (INDEPENDENT_AMBULATORY_CARE_PROVIDER_SITE_OTHER): Payer: Self-pay

## 2018-09-22 ENCOUNTER — Telehealth (INDEPENDENT_AMBULATORY_CARE_PROVIDER_SITE_OTHER): Payer: Commercial Managed Care - PPO | Admitting: Cardiology

## 2018-09-22 ENCOUNTER — Telehealth: Payer: Self-pay | Admitting: Emergency Medicine

## 2018-09-22 ENCOUNTER — Encounter: Payer: Self-pay | Admitting: Cardiology

## 2018-09-22 DIAGNOSIS — I712 Thoracic aortic aneurysm, without rupture: Secondary | ICD-10-CM

## 2018-09-22 DIAGNOSIS — I2510 Atherosclerotic heart disease of native coronary artery without angina pectoris: Secondary | ICD-10-CM

## 2018-09-22 DIAGNOSIS — E119 Type 2 diabetes mellitus without complications: Secondary | ICD-10-CM

## 2018-09-22 DIAGNOSIS — I10 Essential (primary) hypertension: Secondary | ICD-10-CM

## 2018-09-22 DIAGNOSIS — I7121 Aneurysm of the ascending aorta, without rupture: Secondary | ICD-10-CM

## 2018-09-22 DIAGNOSIS — Z4802 Encounter for removal of sutures: Secondary | ICD-10-CM

## 2018-09-22 DIAGNOSIS — Z952 Presence of prosthetic heart valve: Secondary | ICD-10-CM

## 2018-09-22 HISTORY — DX: Presence of prosthetic heart valve: Z95.2

## 2018-09-22 MED FILL — Potassium Chloride Inj 2 mEq/ML: INTRAVENOUS | Qty: 40 | Status: AC

## 2018-09-22 MED FILL — Heparin Sodium (Porcine) Inj 1000 Unit/ML: INTRAMUSCULAR | Qty: 30 | Status: AC

## 2018-09-22 MED FILL — Magnesium Sulfate Inj 50%: INTRAMUSCULAR | Qty: 10 | Status: AC

## 2018-09-22 NOTE — Patient Instructions (Signed)
Medication Instructions:  Your physician recommends that you continue on your current medications as directed. Please refer to the Current Medication list given to you today.  If you need a refill on your cardiac medications before your next appointment, please call your pharmacy.   Lab work: None.  If you have labs (blood work) drawn today and your tests are completely normal, you will receive your results only by: . MyChart Message (if you have MyChart) OR . A paper copy in the mail If you have any lab test that is abnormal or we need to change your treatment, we will call you to review the results.  Testing/Procedures: None.   Follow-Up: At [facility], you and your health needs are our priority.  As part of our continuing mission to provide you with exceptional heart care, we have created designated Provider Care Teams.  These Care Teams include your primary Cardiologist (physician) and Advanced Practice Providers (APPs -  Physician Assistants and Nurse Practitioners) who all work together to provide you with the care you need, when you need it. You will need a follow up appointment in 3 weeks.  Please call our office 2 months in advance to schedule this appointment.  You may see No primary care provider on file. or another member of our [facility] Provider Team in [place]: Brian Munley, MD . Rajan Revankar, MD  Any Other Special Instructions Will Be Listed Below (If Applicable).     

## 2018-09-22 NOTE — Progress Notes (Signed)
Virtual Visit via Telephone Note   This visit type was conducted due to national recommendations for restrictions regarding the COVID-19 Pandemic (e.g. social distancing) in an effort to limit this patient's exposure and mitigate transmission in our community.  Due to his co-morbid illnesses, this patient is at least at moderate risk for complications without adequate follow up.  This format is felt to be most appropriate for this patient at this time.  The patient did not have access to video technology/had technical difficulties with video requiring transitioning to audio format only (telephone).  All issues noted in this document were discussed and addressed.  No physical exam could be performed with this format.  Please refer to the patient's chart for his  consent to telehealth for [facility].  Evaluation Performed:  Follow-up visit  This visit type was conducted due to national recommendations for restrictions regarding the COVID-19 Pandemic (e.g. social distancing).  This format is felt to be most appropriate for this patient at this time.  All issues noted in this document were discussed and addressed.  No physical exam was performed (except for noted visual exam findings with Video Visits).  Please refer to the patient's chart (MyChart message for video visits and phone note for telephone visits) for the patient's consent to telehealth for [facility].  Date:  09/22/2018  ID: Ronnie Ruiz, DOB [birth year], MRN [id number]   Patient Location: [address] [place] [address]   Provider location:   [facility] [place] Office  PCP:  [name], MD  Cardiologist:  Gypsy Balsam, MD     Chief Complaint: Doing well  History of Present Illness:    Ronnie Ruiz is a 53 y.o. male  who presents via audio/video conferencing for a telehealth visit today.  History of bicuspid arctic valve, aortic aneurysm, status post Bentall procedure, diabetes, dyslipidemia.  He  is recovering quite nicely after surgery.  Surgery was lengthy and he lost a lot of blood but overall he is doing amazingly well I do have a phone conversation with him today he supposed to have a video visit but did not happen.  He did not have technical ability to establish that.  He did see his surgeon today and everything was fine.  His wound is healing well.  Overall he reports feeling good he is trying to walk I talked to him about need to join rehab.  He said he will talk about this to his Careers adviser.   The patient does not have symptoms concerning for COVID-19 infection (fever, chills, cough, or new SHORTNESS OF BREATH).    Prior CV studies:   The following studies were reviewed today       Past Medical History:  Diagnosis Date   Alcoholism (HCC) 06/24/2018   Aortic stenosis    Diabetes mellitus type 2, controlled (HCC) 06/24/2018   Dyspnea    Essential hypertension 06/24/2018   GERD (gastroesophageal reflux disease) 06/24/2018   Heart murmur 06/24/2018   Hyperlipidemia 06/24/2018   Thoracic ascending aortic aneurysm [facility])     Past Surgical History:  Procedure Laterality Date   BENTALL PROCEDURE N/A 09/10/2018   Procedure: BENTALL PROCEDURE USING HEMASHIELD 28, GELWEAVE VALSALVA , AND MAGNA EASE ;  Surgeon: [name], MD;  [facility];  Service: Open Heart Surgery;  Laterality: N/A;   CHOLECYSTECTOMY     RIGHT HEART CATH AND CORONARY ANGIOGRAPHY N/A 07/07/2018   Procedure: RIGHT HEART CATH AND CORONARY ANGIOGRAPHY;  Surgeon: Lennette Bihari, MD;  Location: [facility] INVASIVE CV LAB;  Service: Cardiovascular;  Laterality: N/A;   TEE WITHOUT CARDIOVERSION N/A 09/10/2018   Procedure: TRANSESOPHAGEAL ECHOCARDIOGRAM (TEE);  Surgeon: [name], MD;  [facility];  Service: Open Heart Surgery;  Laterality: N/A;     Current Meds  Medication Sig   aspirin EC 325 MG EC tablet Take 1 tablet (325 mg total) by mouth daily.   esomeprazole (NEXIUM) 20 MG capsule  Take 20 mg by mouth every evening.   ferrous fumarate-b12-vitamic C-folic acid (TRINSICON / FOLTRIN) capsule Take 1 capsule by mouth 2 (two) times daily after a meal.   lisinopril (PRINIVIL,ZESTRIL) 10 MG tablet Take 0.5 tablets (5 mg total) by mouth daily.   metFORMIN (GLUCOPHAGE) 500 MG tablet Take 500 mg by mouth 2 (two) times daily with a meal.   metoprolol succinate (TOPROL-XL) 50 MG 24 hr tablet Take 50 mg by mouth daily. Take with or immediately following a meal.   Omega-3 Fatty Acids (FISH OIL) 1000 MG CAPS Take 1,000 mg by mouth 2 (two) times daily.    simvastatin (ZOCOR) 40 MG tablet Take 40 mg by mouth daily.      Family History: The patient's family history includes Alcoholism in his father; Anxiety disorder in his father; CAD in his father; Hyperlipidemia in his father; Lung cancer in his father and mother; Pulmonary disease in his father and mother; Renal cancer in his father.   ROS:   Please see the history of present illness.     All other systems reviewed and are negative.   Labs/Other Tests and Data Reviewed:     Recent Labs: 09/07/2018: ALT 34 09/11/2018: Magnesium 2.4 09/13/2018: BUN 9; Creatinine, Ser 0.76; Potassium 3.6; Sodium 136 09/15/2018: Hemoglobin 8.9; Platelets 211  Recent Lipid Panel No results found for: CHOL, TRIG, HDL, CHOLHDL, VLDL, LDLCALC, LDLDIRECT    Exam:    Vital Signs:  There were no vitals taken for this visit.    Wt Readings from Last 3 Encounters:  09/15/18 212 lb 8 oz (96.4 kg)  09/07/18 221 lb 14.4 oz (100.7 kg)  08/26/18 218 lb (98.9 kg)     Well nourished, well developed in no acute distress. Alert awake oriented x3, not in any distress quite happy to be able to talk to me.  Overall doing well  Diagnosis for this visit:   1. Ascending aortic aneurysm (HCC) 41 mm as measured by echo   2. S/P AVR   3. Essential hypertension   4. Atherosclerosis of native coronary artery of native heart without angina pectoris   5.  Controlled type 2 diabetes mellitus without complication, without long-term current use of insulin (HCC)      ASSESSMENT & PLAN:    1.  Biological Bentall Procedure using a 25 mm Edwards Magna-Ease pericardial valve and a 28 mm Gelweave Valsalva graft.  Recovering very nicely, seen by surgeon today.  I will see him back in my office in 3 weeks. 2.  Essential hypertension blood pressure well controlled 3.  Atherosclerosis stable from that point review 4.  Diabetes he reports to have sugars between 130 and 160 in the morning.  Overall he is recovering quite amazingly nicely after this complicated surgery.  We will follow-up with him in 3 weeks.  COVID-19 Education: The signs and symptoms of COVID-19 were discussed with the patient and how to seek care for testing (follow up with PCP or arrange E-visit).  The importance of social  distancing was discussed today.  Patient Risk:   After full review of this patients clinical status, I feel that they are at least moderate risk at this time.  Time:   Today, I have spent recovering very nicely minutes with the patient with telehealth technology discussing pt health issues.  I spent 16minutes reviewing her chart before the visit.  Visit was finished at 15.    Medication Adjustments/Labs and Tests Ordered: Current medicines are reviewed at length with the patient today.  Concerns regarding medicines are outlined above.  No orders of the defined types were placed in this encounter.  Medication changes: No orders of the defined types were placed in this encounter.    Disposition:  F/up 3 weeks  Signed, Georgeanna Leaobert J. Brailen Macneal, MD, [facility] 09/22/2018 2:58 PM     [facility]

## 2018-09-22 NOTE — Telephone Encounter (Signed)
Left message for patient to return call to be scheduled for follow up appointment.  

## 2018-09-23 NOTE — Telephone Encounter (Signed)
Patient's wife called back and scheduled fu appt from yesterdays visit.

## 2018-10-09 ENCOUNTER — Ambulatory Visit (INDEPENDENT_AMBULATORY_CARE_PROVIDER_SITE_OTHER): Payer: Commercial Managed Care - PPO | Admitting: Cardiology

## 2018-10-09 ENCOUNTER — Encounter: Payer: Self-pay | Admitting: Cardiology

## 2018-10-09 ENCOUNTER — Other Ambulatory Visit: Payer: Self-pay

## 2018-10-09 VITALS — BP 120/68 | HR 103 | Ht 68.5 in | Wt 203.0 lb

## 2018-10-09 DIAGNOSIS — Z952 Presence of prosthetic heart valve: Secondary | ICD-10-CM

## 2018-10-09 DIAGNOSIS — E782 Mixed hyperlipidemia: Secondary | ICD-10-CM

## 2018-10-09 DIAGNOSIS — I10 Essential (primary) hypertension: Secondary | ICD-10-CM

## 2018-10-09 DIAGNOSIS — I712 Thoracic aortic aneurysm, without rupture: Secondary | ICD-10-CM

## 2018-10-09 DIAGNOSIS — I7121 Aneurysm of the ascending aorta, without rupture: Secondary | ICD-10-CM

## 2018-10-09 NOTE — Patient Instructions (Signed)
Medication Instructions:  Your physician recommends that you continue on your current medications as directed. Please refer to the Current Medication list given to you today.  If you need a refill on your cardiac medications before your next appointment, please call your pharmacy.   Lab work: None.  If you have labs (blood work) drawn today and your tests are completely normal, you will receive your results only by: . MyChart Message (if you have MyChart) OR . A paper copy in the mail If you have any lab test that is abnormal or we need to change your treatment, we will call you to review the results.  Testing/Procedures: Your physician has requested that you have an echocardiogram. Echocardiography is a painless test that uses sound waves to create images of your heart. It provides your doctor with information about the size and shape of your heart and how well your heart's chambers and valves are working. This procedure takes approximately one hour. There are no restrictions for this procedure.    Follow-Up: At [facility], you and your health needs are our priority.  As part of our continuing mission to provide you with exceptional heart care, we have created designated Provider Care Teams.  These Care Teams include your primary Cardiologist (physician) and Advanced Practice Providers (APPs -  Physician Assistants and Nurse Practitioners) who all work together to provide you with the care you need, when you need it. You will need a follow up appointment in 3 months.  Please call our office 2 months in advance to schedule this appointment.  You may see No primary care provider on file. or another member of our [facility] Provider Team in [place]: Brian Munley, MD . Rajan Revankar, MD  Any Other Special Instructions Will Be Listed Below (If Applicable).   Echocardiogram An echocardiogram is a procedure that uses painless sound waves (ultrasound) to produce an image of the heart.  Images from an echocardiogram can provide important information about:  Signs of coronary artery disease (CAD).  Aneurysm detection. An aneurysm is a weak or damaged part of an artery wall that bulges out from the normal force of blood pumping through the body.  Heart size and shape. Changes in the size or shape of the heart can be associated with certain conditions, including heart failure, aneurysm, and CAD.  Heart muscle function.  Heart valve function.  Signs of a past heart attack.  Fluid buildup around the heart.  Thickening of the heart muscle.  A tumor or infectious growth around the heart valves. Tell a health care provider about:  Any allergies you have.  All medicines you are taking, including vitamins, herbs, eye drops, creams, and over-the-counter medicines.  Any blood disorders you have.  Any surgeries you have had.  Any medical conditions you have.  Whether you are pregnant or may be pregnant. What are the risks? Generally, this is a safe procedure. However, problems may occur, including:  Allergic reaction to dye (contrast) that may be used during the procedure. What happens before the procedure? No specific preparation is needed. You may eat and drink normally. What happens during the procedure?   An IV tube may be inserted into one of your veins.  You may receive contrast through this tube. A contrast is an injection that improves the quality of the pictures from your heart.  A gel will be applied to your chest.  A wand-like tool (transducer) will be moved over your chest. The gel will help to   transmit the sound waves from the transducer.  The sound waves will harmlessly bounce off of your heart to allow the heart images to be captured in real-time motion. The images will be recorded on a computer. The procedure may vary among health care providers and hospitals. What happens after the procedure?  You may return to your normal, everyday life,  including diet, activities, and medicines, unless your health care provider tells you not to do that. Summary  An echocardiogram is a procedure that uses painless sound waves (ultrasound) to produce an image of the heart.  Images from an echocardiogram can provide important information about the size and shape of your heart, heart muscle function, heart valve function, and fluid buildup around your heart.  You do not need to do anything to prepare before this procedure. You may eat and drink normally.  After the echocardiogram is completed, you may return to your normal, everyday life, unless your health care provider tells you not to do that. This information is not intended to replace advice given to you by your health care provider. Make sure you discuss any questions you have with your health care provider. Document Released: 04/05/2000 Document Revised: 05/11/2016 Document Reviewed: 05/11/2016 Elsevier Interactive Patient Education  2019 Elsevier Inc.    

## 2018-10-09 NOTE — Progress Notes (Signed)
Cardiology Office Note:    Date:  10/09/2018   ID:  Ronnie Ruiz, DOB [birth year], MRN [id number]  PCP:  Lillard Anes, MD  Cardiologist:  Jenne Campus, MD    Referring MD: Lillard Anes,*   Chief Complaint  Patient presents with  . 3 week follow up  Doing very well  History of Present Illness:    Ronnie Ruiz is a 53 y.o. male with status post Bentall procedure done 4 weeks ago.  That was secondary to bicuspid arctic valve and extending aortic aneurysm.  Procedure was lengthy however he is recovering very excellently now.  Described to have some still twinges and pain in the chest but overall he is walking on a regular basis he participate in rehab and overall doing excellently.  Past Medical History:  Diagnosis Date  . Alcoholism ([place]) 06/24/2018  . Aortic stenosis   . Diabetes mellitus type 2, controlled ([place]) 06/24/2018  . Dyspnea   . Essential hypertension 06/24/2018  . GERD (gastroesophageal reflux disease) 06/24/2018  . Heart murmur 06/24/2018  . Hyperlipidemia 06/24/2018  . Thoracic ascending aortic aneurysm [facility])     Past Surgical History:  Procedure Laterality Date  . BENTALL PROCEDURE N/A 09/10/2018   Procedure: BENTALL PROCEDURE USING HEMASHIELD 28, GELWEAVE VALSALVA 28MM, AND MAGNA EASE 25MM;  Surgeon: Gaye Pollack, MD;  Location: [place];  Service: Open Heart Surgery;  Laterality: N/A;  . CHOLECYSTECTOMY    . RIGHT HEART CATH AND CORONARY ANGIOGRAPHY N/A 07/07/2018   Procedure: RIGHT HEART CATH AND CORONARY ANGIOGRAPHY;  Surgeon: Troy Sine, MD;  Location: [place] CV LAB;  Service: Cardiovascular;  Laterality: N/A;  . TEE WITHOUT CARDIOVERSION N/A 09/10/2018   Procedure: TRANSESOPHAGEAL ECHOCARDIOGRAM (TEE);  Surgeon: Gaye Pollack, MD;  Location: [place];  Service: Open Heart Surgery;  Laterality: N/A;    Current Medications: Current Meds  Medication Sig  . aspirin EC 325 MG EC tablet Take 1 tablet (325 mg total) by mouth daily.  Marland Kitchen  esomeprazole (NEXIUM) 20 MG capsule Take 20 mg by mouth every evening.  Marland Kitchen lisinopril (PRINIVIL,ZESTRIL) 10 MG tablet Take 0.5 tablets (5 mg total) by mouth daily.  . metFORMIN (GLUCOPHAGE) 500 MG tablet Take 500 mg by mouth 2 (two) times daily with a meal.  . metoprolol succinate (TOPROL-XL) 50 MG 24 hr tablet Take 50 mg by mouth daily. Take with or immediately following a meal.  . Omega-3 Fatty Acids (FISH OIL) 1000 MG CAPS Take 1,000 mg by mouth 2 (two) times daily.   . simvastatin (ZOCOR) 40 MG tablet Take 40 mg by mouth daily.     Allergies:   Patient has no known allergies.   Social History   Socioeconomic History  . Marital status: Married    Spouse name: Not on file  . Number of children: Not on file  . Years of education: Not on file  . Highest education level: Not on file  Occupational History  . Not on file  Social Needs  . Financial resource strain: Not on file  . Food insecurity    Worry: Not on file    Inability: Not on file  . Transportation needs    Medical: Not on file    Non-medical: Not on file  Tobacco Use  . Smoking status: Never Smoker  . Smokeless tobacco: Current User    Types: Snuff  Substance and Sexual Activity  . Alcohol use: Yes    Comment: daily  . Drug  use: Never  . Sexual activity: Not on file  Lifestyle  . Physical activity    Days per week: Not on file    Minutes per session: Not on file  . Stress: Not on file  Relationships  . Social Musicianconnections    Talks on phone: Not on file    Gets together: Not on file    Attends religious service: Not on file    Active member of club or organization: Not on file    Attends meetings of clubs or organizations: Not on file    Relationship status: Not on file  Other Topics Concern  . Not on file  Social History Narrative  . Not on file     Family History: The patient's family history includes Alcoholism in his father; Anxiety disorder in his father; CAD in his father; Hyperlipidemia in his  father; Lung cancer in his father and mother; Pulmonary disease in his father and mother; Renal cancer in his father. ROS:   Please see the history of present illness.    All 14 point review of systems negative except as described per history of present illness  EKGs/Labs/Other Studies Reviewed:      Recent Labs: 09/07/2018: ALT 34 09/11/2018: Magnesium 2.4 09/13/2018: BUN 9; Creatinine, Ser 0.76; Potassium 3.6; Sodium 136 09/15/2018: Hemoglobin 8.9; Platelets 211  Recent Lipid Panel No results found for: CHOL, TRIG, HDL, CHOLHDL, VLDL, LDLCALC, LDLDIRECT  Physical Exam:    VS:  BP 120/68   Pulse (!) 103   Ht 5' 8.5" (1.74 m)   Wt 203 lb (92.1 kg)   SpO2 98%   BMI 30.42 kg/m     Wt Readings from Last 3 Encounters:  10/09/18 203 lb (92.1 kg)  09/15/18 212 lb 8 oz (96.4 kg)  09/07/18 221 lb 14.4 oz (100.7 kg)     GEN:  Well nourished, well developed in no acute distress HEENT: Normal NECK: No JVD; No carotid bruits LYMPHATICS: No lymphadenopathy CARDIAC: RRR, systolic ejection murmur grade 2/6 best heard at right upper portion of the sternum, S2 is loud, no rubs, no gallops RESPIRATORY:  Clear to auscultation without rales, wheezing or rhonchi  ABDOMEN: Soft, non-tender, non-distended MUSCULOSKELETAL:  No edema; No deformity  SKIN: Warm and dry LOWER EXTREMITIES: no swelling NEUROLOGIC:  Alert and oriented x 3 PSYCHIATRIC:  Normal affect  Sternotomy wound is healed  ASSESSMENT:    1. S/P AVR   2. Mixed hyperlipidemia   3. Ascending aortic aneurysm (HCC) 41 mm as measured by echo   4. Essential hypertension    PLAN:    In order of problems listed above:  1. Status post aortic valve replacement.  Things are looking good.  We will schedule him to have echocardiogram within the next month or so.  Overall I consider his recovery as excellent. 2. Mixed dyslipidemia.  Will get fasting lipid profile from primary care physician 3. Standing undergarments status post  surgery 4. Essential hypertension blood pressure well controlled   Medication Adjustments/Labs and Tests Ordered: Current medicines are reviewed at length with the patient today.  Concerns regarding medicines are outlined above.  No orders of the defined types were placed in this encounter.  Medication changes: No orders of the defined types were placed in this encounter.   Signed, Georgeanna Leaobert J. [name], MD, [facility] 10/09/2018 10:08 AM    [facility]

## 2018-10-19 ENCOUNTER — Other Ambulatory Visit: Payer: Self-pay

## 2018-10-19 ENCOUNTER — Other Ambulatory Visit: Payer: Self-pay | Admitting: Surgery

## 2018-10-19 DIAGNOSIS — Z952 Presence of prosthetic heart valve: Secondary | ICD-10-CM

## 2018-10-20 ENCOUNTER — Ambulatory Visit (INDEPENDENT_AMBULATORY_CARE_PROVIDER_SITE_OTHER): Payer: Self-pay | Admitting: Surgery

## 2018-10-20 ENCOUNTER — Encounter: Payer: Self-pay | Admitting: Surgery

## 2018-10-20 ENCOUNTER — Ambulatory Visit
Admission: RE | Admit: 2018-10-20 | Discharge: 2018-10-20 | Disposition: A | Discharge status: Home / Self Care | Payer: Commercial Managed Care - PPO | Source: Ambulatory Visit | Attending: Surgery | Admitting: Surgery

## 2018-10-20 VITALS — BP 104/64 | HR 93 | Temp 97.0°F | Resp 16 | Ht 68.5 in | Wt 208.0 lb

## 2018-10-20 DIAGNOSIS — I350 Nonrheumatic aortic (valve) stenosis: Secondary | ICD-10-CM

## 2018-10-20 DIAGNOSIS — I712 Thoracic aortic aneurysm, without rupture, unspecified: Secondary | ICD-10-CM

## 2018-10-20 DIAGNOSIS — Z952 Presence of prosthetic heart valve: Secondary | ICD-10-CM

## 2018-10-20 NOTE — Progress Notes (Signed)
     HPI: Patient returns for routine postoperative follow-up having undergone biological Bentall procedure using a 25 mm Edwards magna-ease pericardial valve and replacement of the ascending aorta on 09/10/2018. The patient's early postoperative recovery while in the hospital was notable for an uncomplicated postoperative course. Since hospital discharge the patient reports that he feels much better.  He has been walking several miles per day without chest pain or shortness of breath.  He is anxious to return to work.   Current Outpatient Medications  Medication Sig Dispense Refill  . aspirin EC 325 MG EC tablet Take 1 tablet (325 mg total) by mouth daily.    Marland Kitchen esomeprazole (NEXIUM) 20 MG capsule Take 20 mg by mouth every evening.    Marland Kitchen lisinopril (PRINIVIL,ZESTRIL) 10 MG tablet Take 0.5 tablets (5 mg total) by mouth daily. 90 tablet 0  . metFORMIN (GLUCOPHAGE) 500 MG tablet Take 500 mg by mouth 2 (two) times daily with a meal.    . metoprolol succinate (TOPROL-XL) 50 MG 24 hr tablet Take 50 mg by mouth daily. Take with or immediately following a meal.    . Omega-3 Fatty Acids (FISH OIL) 1000 MG CAPS Take 1,000 mg by mouth 2 (two) times daily.     . simvastatin (ZOCOR) 40 MG tablet Take 40 mg by mouth daily.     No current facility-administered medications for this visit.     Physical Exam: BP 104/64 (BP Location: Right Arm, Patient Position: Sitting, Cuff Size: Normal)   Pulse 93   Temp (!) 97 F (36.1 C) Comment: THERMAL  Resp 16   Ht 5' 8.5" (1.74 m)   Wt 94.3 kg   SpO2 93% Comment: RA  BMI 31.17 kg/m  He looks well. Cardiac exam shows a regular rate and rhythm with normal heart sounds.  There is no murmur. Lungs are clear. The chest incision is healing well and the sternum is stable. There is no peripheral edema.  Diagnostic Tests:  CLINICAL DATA:  Post AVR on 09/10/2018, no complaints, diabetes mellitus, hypertension, GERD  EXAM: CHEST - 2 VIEW  COMPARISON:   09/13/2018  FINDINGS: Normal heart size post median sternotomy and AVR.  Mediastinal contours and pulmonary vascularity normal.  Lungs clear.  No infiltrate, pleural effusion or pneumothorax.  Bones unremarkable.  IMPRESSION: Post AVR.  No acute abnormalities.   Electronically Signed   By: Lavonia Dana M.D.   On: 10/20/2018 10:21   Impression:  Overall he is making a good recovery following his surgery.  He is already driving a car and has started cardiac rehab.  He is anxious to return to work and I told him he could return to work on 10/26/2018 as long as he was not lifting anything more than 10 pounds for 3 months postoperatively.  I gave him a return to work note today with that restriction.  Plan:  He is going to have a follow-up echocardiogram in the next month or so and will continue to follow-up with [facility] and Dr. Henrene Pastor.  I will plan to see him back in 1 year with a CTA of the chest to reassess the remainder of his aorta.  If it is stable at that time he will not need long-term follow-up of his aorta.   Gaye Pollack, MD [facility] [phone]

## 2018-10-21 ENCOUNTER — Ambulatory Visit: Payer: Commercial Managed Care - PPO | Admitting: Surgery

## 2018-11-20 ENCOUNTER — Other Ambulatory Visit: Payer: Self-pay

## 2018-11-20 ENCOUNTER — Ambulatory Visit (INDEPENDENT_AMBULATORY_CARE_PROVIDER_SITE_OTHER): Payer: Commercial Managed Care - PPO

## 2018-11-20 DIAGNOSIS — I10 Essential (primary) hypertension: Secondary | ICD-10-CM

## 2018-11-20 DIAGNOSIS — Z952 Presence of prosthetic heart valve: Secondary | ICD-10-CM | POA: Diagnosis not present

## 2018-11-20 DIAGNOSIS — I712 Thoracic aortic aneurysm, without rupture: Secondary | ICD-10-CM | POA: Diagnosis not present

## 2018-11-20 NOTE — Progress Notes (Signed)
Complete echocardiogram has been performed.  Jimmy Carlen Fils RDCS, RVT 

## 2019-01-11 ENCOUNTER — Ambulatory Visit (INDEPENDENT_AMBULATORY_CARE_PROVIDER_SITE_OTHER): Payer: Commercial Managed Care - PPO | Admitting: Cardiology

## 2019-01-11 ENCOUNTER — Other Ambulatory Visit: Payer: Self-pay

## 2019-01-11 ENCOUNTER — Encounter: Payer: Self-pay | Admitting: Cardiology

## 2019-01-11 VITALS — BP 132/60 | HR 68 | Ht 68.5 in | Wt 210.4 lb

## 2019-01-11 DIAGNOSIS — Q231 Congenital insufficiency of aortic valve: Secondary | ICD-10-CM

## 2019-01-11 DIAGNOSIS — I10 Essential (primary) hypertension: Secondary | ICD-10-CM | POA: Diagnosis not present

## 2019-01-11 DIAGNOSIS — Z952 Presence of prosthetic heart valve: Secondary | ICD-10-CM | POA: Diagnosis not present

## 2019-01-11 NOTE — Progress Notes (Signed)
Cardiology Office Note:    Date:  01/11/2019   ID:  Ronnie Ruiz, DOB October 31, 1965, MRN [id number]  PCP:  Lillard Anes, MD  Cardiologist:  Jenne Campus, MD    Referring MD: Lillard Anes,*   Chief Complaint  Patient presents with  . Follow-up  Doing well  History of Present Illness:    Ronnie Ruiz is a 53 y.o. male status post biological Bentall Procedure using a 48mm Edwards Magna-Ease pericardial valve and a 59mm Gelweave Valsalva graft secondary to bicuspid aortic valve with critical aortic stenosis, enlarged aorta.  He comes today to my office for follow-up overall doing great asymptomatic, no chest pain no tightness no squeezing no pressure no burning in the chest.  He walks and walks a lot have no difficulty doing it.  Overall doing well  Past Medical History:  Diagnosis Date  . Alcoholism ([place]) 06/24/2018  . Aortic stenosis   . Diabetes mellitus type 2, controlled ([place]) 06/24/2018  . Dyspnea   . Essential hypertension 06/24/2018  . GERD (gastroesophageal reflux disease) 06/24/2018  . Heart murmur 06/24/2018  . Hyperlipidemia 06/24/2018  . Thoracic ascending aortic aneurysm [facility])     Past Surgical History:  Procedure Laterality Date  . BENTALL PROCEDURE N/A 09/10/2018   Procedure: BENTALL PROCEDURE USING HEMASHIELD 28, GELWEAVE VALSALVA 28MM, AND MAGNA EASE 25MM;  Surgeon: Gaye Pollack, MD;  Location: [place];  Service: Open Heart Surgery;  Laterality: N/A;  . CHOLECYSTECTOMY    . RIGHT HEART CATH AND CORONARY ANGIOGRAPHY N/A 07/07/2018   Procedure: RIGHT HEART CATH AND CORONARY ANGIOGRAPHY;  Surgeon: Troy Sine, MD;  Location: [place] CV LAB;  Service: Cardiovascular;  Laterality: N/A;  . TEE WITHOUT CARDIOVERSION N/A 09/10/2018   Procedure: TRANSESOPHAGEAL ECHOCARDIOGRAM (TEE);  Surgeon: Gaye Pollack, MD;  Location: [place];  Service: Open Heart Surgery;  Laterality: N/A;    Current Medications: Current Meds  Medication Sig  . aspirin EC  325 MG EC tablet Take 1 tablet (325 mg total) by mouth daily.  Marland Kitchen esomeprazole (NEXIUM) 20 MG capsule Take 20 mg by mouth every evening.  Marland Kitchen lisinopril (PRINIVIL,ZESTRIL) 10 MG tablet Take 0.5 tablets (5 mg total) by mouth daily.  . metFORMIN (GLUCOPHAGE) 500 MG tablet Take 500 mg by mouth 2 (two) times daily with a meal.  . metoprolol succinate (TOPROL-XL) 50 MG 24 hr tablet Take 50 mg by mouth daily. Take with or immediately following a meal.  . Omega-3 Fatty Acids (FISH OIL) 1000 MG CAPS Take 1,000 mg by mouth 2 (two) times daily.   . simvastatin (ZOCOR) 40 MG tablet Take 40 mg by mouth daily.     Allergies:   Patient has no known allergies.   Social History   Socioeconomic History  . Marital status: Married    Spouse name: Not on file  . Number of children: Not on file  . Years of education: Not on file  . Highest education level: Not on file  Occupational History  . Not on file  Social Needs  . Financial resource strain: Not on file  . Food insecurity    Worry: Not on file    Inability: Not on file  . Transportation needs    Medical: Not on file    Non-medical: Not on file  Tobacco Use  . Smoking status: Never Smoker  . Smokeless tobacco: Current User    Types: Snuff  Substance and Sexual Activity  . Alcohol use: Yes  Comment: daily  . Drug use: Never  . Sexual activity: Not on file  Lifestyle  . Physical activity    Days per week: Not on file    Minutes per session: Not on file  . Stress: Not on file  Relationships  . Social Musicianconnections    Talks on phone: Not on file    Gets together: Not on file    Attends religious service: Not on file    Active member of club or organization: Not on file    Attends meetings of clubs or organizations: Not on file    Relationship status: Not on file  Other Topics Concern  . Not on file  Social History Narrative  . Not on file     Family History: The patient's family history includes Alcoholism in his father; Anxiety  disorder in his father; CAD in his father; Hyperlipidemia in his father; Lung cancer in his father and mother; Pulmonary disease in his father and mother; Renal cancer in his father. ROS:   Please see the history of present illness.    All 14 point review of systems negative except as described per history of present illness  EKGs/Labs/Other Studies Reviewed:      Recent Labs: 09/07/2018: ALT 34 09/11/2018: Magnesium 2.4 09/13/2018: BUN 9; Creatinine, Ser 0.76; Potassium 3.6; Sodium 136 09/15/2018: Hemoglobin 8.9; Platelets 211  Recent Lipid Panel No results found for: CHOL, TRIG, HDL, CHOLHDL, VLDL, LDLCALC, LDLDIRECT  Physical Exam:    VS:  BP 132/60   Pulse 68   Ht 5' 8.5" (1.74 m)   Wt 210 lb 6.4 oz (95.4 kg)   SpO2 97%   BMI 31.53 kg/m     Wt Readings from Last 3 Encounters:  01/11/19 210 lb 6.4 oz (95.4 kg)  10/20/18 208 lb (94.3 kg)  10/09/18 203 lb (92.1 kg)     GEN:  Well nourished, well developed in no acute distress HEENT: Normal NECK: No JVD; No carotid bruits LYMPHATICS: No lymphadenopathy CARDIAC: RRR, systolic ejection murmur grade 2/6 best heard at the right upper portion of the sternum, no rubs, no gallops RESPIRATORY:  Clear to auscultation without rales, wheezing or rhonchi  ABDOMEN: Soft, non-tender, non-distended MUSCULOSKELETAL:  No edema; No deformity  SKIN: Warm and dry LOWER EXTREMITIES: no swelling NEUROLOGIC:  Alert and oriented x 3 PSYCHIATRIC:  Normal affect   ASSESSMENT:    1. S/P AVR   2. Bicuspid aortic valve   3. Essential hypertension    PLAN:    In order of problems listed above:  1. Status post aortic valve replacement.  Doing overall clinically very well.  Wound healed completely.  His aortic valve last assessment at the end of July by echocardiogram and functions well he is recovering nicely 2. Status post aortic root replacement recovering 3. Essential hypertension blood pressure usually 110 220 systolic will continue  present management. 4. Diabetes he is getting ready to have some lab work done by his primary care physician tomorrow that will include fasting lipid profile.  We would like to see his LDL around 70.  Overall he is recovering well I see him back in 6 months or sooner if he has a problem   Medication Adjustments/Labs and Tests Ordered: Current medicines are reviewed at length with the patient today.  Concerns regarding medicines are outlined above.  No orders of the defined types were placed in this encounter.  Medication changes: No orders of the defined types were placed in this encounter.  Signed, Georgeanna Lea, MD, [facility] 01/11/2019 2:20 PM    [facility]

## 2019-01-11 NOTE — Patient Instructions (Signed)
Medication Instructions:  Your physician recommends that you continue on your current medications as directed. Please refer to the Current Medication list given to you today.  If you need a refill on your cardiac medications before your next appointment, please call your pharmacy.   Lab work: None.   If you have labs (blood work) drawn today and your tests are completely normal, you will receive your results only by: . MyChart Message (if you have MyChart) OR . A paper copy in the mail If you have any lab test that is abnormal or we need to change your treatment, we will call you to review the results.  Testing/Procedures: None.   Follow-Up: At [facility], you and your health needs are our priority.  As part of our continuing mission to provide you with exceptional heart care, we have created designated Provider Care Teams.  These Care Teams include your primary Cardiologist (physician) and Advanced Practice Providers (APPs -  Physician Assistants and Nurse Practitioners) who all work together to provide you with the care you need, when you need it. You will need a follow up appointment in 6 months.  Please call our office 2 months in advance to schedule this appointment.  You may see No primary care provider on file. or another member of our [facility] Provider Team in [place]: Brian Munley, MD . Rajan Revankar, MD  Any Other Special Instructions Will Be Listed Below (If Applicable).    

## 2019-08-03 ENCOUNTER — Other Ambulatory Visit: Payer: Self-pay | Admitting: Cardiology

## 2019-08-03 MED ORDER — LISINOPRIL 10 MG PO TABS: 5.0000 mg | ORAL_TABLET | Freq: Every day | ORAL | 0 refills | Status: DC

## 2019-08-03 NOTE — Telephone Encounter (Signed)
*  STAT* If patient is at the pharmacy, call can be transferred to refill team.   1. Which medications need to be refilled? (please list name of each medication and dose if known)   lisinopril (PRINIVIL,ZESTRIL) 10 MG tablet     2. Which pharmacy/location (including street and city if local pharmacy) is medication to be sent to? [facility] [phone] - [place], [place] - [address]  3. Do they need a 30 day or 90 day supply? 30 day supply

## 2019-08-03 NOTE — Telephone Encounter (Signed)
Medication filled.  

## 2019-08-04 ENCOUNTER — Other Ambulatory Visit: Payer: Self-pay | Admitting: Legal Medicine

## 2019-09-03 ENCOUNTER — Other Ambulatory Visit: Payer: Self-pay | Admitting: *Deleted

## 2019-09-03 DIAGNOSIS — I712 Thoracic aortic aneurysm, without rupture, unspecified: Secondary | ICD-10-CM

## 2019-10-20 ENCOUNTER — Ambulatory Visit
Admission: RE | Admit: 2019-10-20 | Discharge: 2019-10-20 | Disposition: A | Discharge status: Home / Self Care | Payer: Commercial Managed Care - PPO | Source: Ambulatory Visit | Attending: Surgery | Admitting: Surgery

## 2019-10-20 ENCOUNTER — Other Ambulatory Visit: Payer: Self-pay

## 2019-10-20 ENCOUNTER — Ambulatory Visit: Payer: Commercial Managed Care - PPO | Admitting: Surgery

## 2019-10-20 ENCOUNTER — Encounter: Payer: Self-pay | Admitting: Surgery

## 2019-10-20 VITALS — BP 135/81 | HR 70 | Temp 97.5°F | Resp 20 | Ht 68.5 in | Wt 211.0 lb

## 2019-10-20 DIAGNOSIS — I712 Thoracic aortic aneurysm, without rupture, unspecified: Secondary | ICD-10-CM

## 2019-10-20 DIAGNOSIS — Z952 Presence of prosthetic heart valve: Secondary | ICD-10-CM | POA: Diagnosis not present

## 2019-10-20 MED ORDER — IOPAMIDOL (ISOVUE-370) INJECTION 76%
75.0000 mL | Freq: Once | INTRAVENOUS | Status: AC | PRN
  Administered 2019-10-20: 75 mL via INTRAVENOUS

## 2019-10-20 NOTE — Progress Notes (Signed)
HPI:  The patient is a 54 year old gentleman who underwent biological Bentall procedure using a 25 mm Edwards Magna-Ease pericardial valve inside a 28 mm Gelweave Valsalva graft and replacement of the ascending aorta (hemi-arch) with a 28 mm Hemashield graft on 09/10/2018.  He had an uncomplicated postoperative course and has done well over the past year.  He feels well without any chest pain or shortness of breath.  His stamina is normal.  He has had no peripheral edema.  Current Outpatient Medications  Medication Sig Dispense Refill   aspirin EC 325 MG EC tablet Take 1 tablet (325 mg total) by mouth daily.     docusate sodium (COLACE) 250 MG capsule Take 250 mg by mouth daily as needed for constipation.     esomeprazole (NEXIUM) 20 MG capsule Take 20 mg by mouth every evening.     lisinopril (ZESTRIL) 10 MG tablet Take 0.5 tablets (5 mg total) by mouth daily. 45 tablet 0   metFORMIN (GLUCOPHAGE) 500 MG tablet TAKE 1 TABLET BY MOUTH TWICE DAILY 420 tablet 1   metoprolol succinate (TOPROL-XL) 50 MG 24 hr tablet Take 50 mg by mouth daily. Take with or immediately following a meal.     Omega-3 Fatty Acids (FISH OIL) 1000 MG CAPS Take 1,000 mg by mouth 2 (two) times daily.      simvastatin (ZOCOR) 40 MG tablet Take 40 mg by mouth daily.     No current facility-administered medications for this visit.     Physical Exam: BP 135/81 (BP Location: Right Arm, Patient Position: Sitting, Cuff Size: Normal)    Pulse 70    Temp (!) 97.5 F (36.4 C) (Temporal)    Resp 20    Ht 5' 8.5" (1.74 m)    Wt 211 lb (95.7 kg)    SpO2 97% Comment: RA   BMI 31.62 kg/m  He looks well. Cardiac exam shows a regular rate and rhythm with a 2/6 systolic flow murmur across the aortic valve prosthesis.  There is no diastolic murmur. Lungs are clear. The chest incision is well-healed and the sternum is stable. There is no peripheral edema.  Diagnostic Tests:  CLINICAL DATA:  54 year old male with a  history of thoracic aortic aneurysm, status post repair 09/10/2018 with biologic Bentall  EXAM: CT ANGIOGRAPHY CHEST WITH CONTRAST  TECHNIQUE: Multidetector CT imaging of the chest was performed using the standard protocol during bolus administration of intravenous contrast. Multiplanar CT image reconstructions and MIPs were obtained to evaluate the vascular anatomy.  CONTRAST:  102mL ISOVUE-370 IOPAMIDOL (ISOVUE-370) INJECTION 76%  COMPARISON:  08/31/2018  FINDINGS: Cardiovascular:  Heart:  No cardiomegaly. No pericardial fluid/thickening. Unremarkable appearance of the coronary reimplantation sites. Calcifications of the left anterior descending and the circumflex coronary arteries.  Aorta:  Surgical changes of bilateral Bentall with valve replacement. Unremarkable appearance of the ascending aorta. The aortic cannulation site just proximal to the distal anastomosis demonstrates a small outpouching/pseudoaneurysm, measured 7 mm.  The right common carotid artery and left common carotid artery share a common origin with minimal atherosclerosis. Aberrant right subclavian artery, unremarkable with node diverticulum. Unremarkable appearance of the left subclavian artery.  Unremarkable course caliber and contour of the distal thoracic aorta.  Pulmonary arteries:  No central, lobar, segmental, or proximal subsegmental filling defects.  Mediastinum/Nodes: Surgical changes of median sternotomy. No mediastinal adenopathy. Unremarkable appearance of the thoracic esophagus.  Unremarkable thoracic inlet.  Lungs/Pleura: Central airways are clear. No pleural effusion. No confluent airspace disease.  No pneumothorax.  Upper Abdomen: No acute.  Musculoskeletal: No acute displaced fracture. Degenerative changes of the spine.  Review of the MIP images confirms the above findings.  IMPRESSION: Surgical changes of Bentall repair for ascending thoracic  aneurysm. There is a small contrast outpouching at the cannulation site just proximal to the arch anastomosis, postoperative change versus pseudoaneurysm.  Coronary artery disease, with unremarkable appearance of the coronary reimplantation sites.  Signed,  Yvone Neu. Reyne Dumas, RPVI  [facility]  [facility]   Electronically Signed   By: Gilmer Mor D.O.   On: 10/20/2019 13:39   Impression:  He is doing well 1 year following his surgical repair.  CTA of the chest shows all anastomoses are intact.  The remainder of the aortic arch and descending thoracic aorta is normal sized.  The small contrast outpouching just proximal to the arch anastomosis is due to the residual perfusion SideArm graft which was suture-ligated with a short stump.  This is not a pseudoaneurysm.  Plan:  He will continue to follow-up with his primary physician and Dr. Bing Matter.  I do not think he requires any further CT scans for follow-up.  I spent 15 minutes performing this established patient evaluation and > 50% of this time was spent face to face counseling and coordinating the care of this patient's aortic aneurysm repair.    [name], MD [facility] [phone]

## 2019-11-01 ENCOUNTER — Other Ambulatory Visit: Payer: Self-pay | Admitting: Cardiology

## 2019-11-01 ENCOUNTER — Other Ambulatory Visit: Payer: Self-pay | Admitting: Legal Medicine

## 2019-11-15 ENCOUNTER — Telehealth: Payer: Self-pay

## 2019-11-15 NOTE — Telephone Encounter (Signed)
Patient had a message left on his hone that he was to get some labs done. Patient usually gets labs drawn from [place]. Patient requesting to pick up order from our office today.

## 2019-11-30 ENCOUNTER — Other Ambulatory Visit: Payer: Self-pay | Admitting: Legal Medicine

## 2019-12-29 ENCOUNTER — Other Ambulatory Visit: Payer: Self-pay | Admitting: Cardiology

## 2020-01-28 ENCOUNTER — Other Ambulatory Visit: Payer: Self-pay | Admitting: Cardiology

## 2020-02-23 ENCOUNTER — Other Ambulatory Visit: Payer: Self-pay | Admitting: Cardiology

## 2020-02-28 ENCOUNTER — Other Ambulatory Visit: Payer: Self-pay

## 2020-02-28 ENCOUNTER — Encounter: Payer: Self-pay | Admitting: Legal Medicine

## 2020-02-28 ENCOUNTER — Ambulatory Visit: Payer: Commercial Managed Care - PPO | Admitting: Legal Medicine

## 2020-02-28 VITALS — BP 122/68 | HR 70 | Temp 97.8°F | Ht 69.0 in | Wt 213.8 lb

## 2020-02-28 DIAGNOSIS — E669 Obesity, unspecified: Secondary | ICD-10-CM | POA: Diagnosis not present

## 2020-02-28 DIAGNOSIS — I7121 Aneurysm of the ascending aorta, without rupture: Secondary | ICD-10-CM

## 2020-02-28 DIAGNOSIS — I10 Essential (primary) hypertension: Secondary | ICD-10-CM | POA: Diagnosis not present

## 2020-02-28 DIAGNOSIS — E1159 Type 2 diabetes mellitus with other circulatory complications: Secondary | ICD-10-CM | POA: Insufficient documentation

## 2020-02-28 DIAGNOSIS — K219 Gastro-esophageal reflux disease without esophagitis: Secondary | ICD-10-CM | POA: Diagnosis not present

## 2020-02-28 DIAGNOSIS — E1169 Type 2 diabetes mellitus with other specified complication: Secondary | ICD-10-CM

## 2020-02-28 DIAGNOSIS — E119 Type 2 diabetes mellitus without complications: Secondary | ICD-10-CM | POA: Insufficient documentation

## 2020-02-28 DIAGNOSIS — I712 Thoracic aortic aneurysm, without rupture: Secondary | ICD-10-CM

## 2020-02-28 DIAGNOSIS — E782 Mixed hyperlipidemia: Secondary | ICD-10-CM

## 2020-02-28 DIAGNOSIS — I152 Hypertension secondary to endocrine disorders: Secondary | ICD-10-CM

## 2020-02-28 HISTORY — DX: Hypertension secondary to endocrine disorders: I15.2

## 2020-02-28 HISTORY — DX: Type 2 diabetes mellitus with other circulatory complications: E11.59

## 2020-02-28 HISTORY — DX: Type 2 diabetes mellitus with other specified complication: E11.69

## 2020-02-28 HISTORY — DX: Obesity, unspecified: E66.9

## 2020-02-28 LAB — POCT UA - MICROALBUMIN
Albumin/Creatinine Ratio, Urine, POC: 30
Creatinine, POC: 50 mg/dL

## 2020-02-28 NOTE — Progress Notes (Signed)
Subjective:  Patient ID: Ronnie Ruiz, male    DOB: 02-Jan-1966  Age: 54 y.o. MRN: [id number]  Chief Complaint  Patient presents with  . Diabetes    HPI: chronic visit  Patient present with type 2 diabetes.  Specifically, this is type 2, noninsulin requiring diabetes, complicated by Cholestrol an dhypertension.  Compliance with treatment has been good; patient take medicines as directed, maintains diet and exercise regimen, follows up as directed, and is keeping glucose diary.  Date of  diagnosis 2010.  Depression screen has been performed.Tobacco screen nonsmoker. Current medicines for diabetes metformin.  Patient is on lisinopril for renal protection and simvastin for cholesterol control.  Patient performs foot exams daily and last ophthalmologic exam was not yet  Patient presents for follow up of hypertension.  Patient tolerating lisinopril and metoprolol well with side effects.  Patient was diagnosed with hypertension 2010 so has been treated for hypertension for 10 years.Patient is working on maintaining diet and exercise regimen and follows up as directed. Complication include CAD.Marland Kitchenpatient has aortic aneurysm repaired 2 years ago and AVR one year ago.  Patient presents with hyperlipidemia.  Compliance with treatment has been good; patient takes medicines as directed, maintains low cholesterol diet, follows up as directed, and maintains exercise regimen.  Patient is using simvastatin without problems.   Current Outpatient Medications on File Prior to Visit  Medication Sig Dispense Refill  . aspirin EC 325 MG EC tablet Take 1 tablet (325 mg total) by mouth daily.    Marland Kitchen docusate sodium (COLACE) 250 MG capsule Take 250 mg by mouth daily as needed for constipation.    Marland Kitchen esomeprazole (NEXIUM) 20 MG capsule Take 20 mg by mouth every evening.    Marland Kitchen lisinopril (ZESTRIL) 10 MG tablet Take 1 tablet (10 mg total) by mouth daily. Take 1/2 tablet (5 MG) by mouth daily,  Needs appointment for more  refills. 15 tablet 0  . metFORMIN (GLUCOPHAGE) 500 MG tablet TAKE 1 TABLET BY MOUTH TWICE DAILY 420 tablet 1  . metoprolol succinate (TOPROL-XL) 50 MG 24 hr tablet TAKE 1 TABLET BY MOUTH ONCE DAILY. 30 tablet 6  . Omega-3 Fatty Acids (FISH OIL) 1000 MG CAPS Take 1,000 mg by mouth 2 (two) times daily.     . simvastatin (ZOCOR) 40 MG tablet TAKE 1 TABLET BY MOUTH EVERY DAY 30 tablet 6   No current facility-administered medications on file prior to visit.   Past Medical History:  Diagnosis Date  . Alcoholism (HCC) 06/24/2018  . Aortic stenosis   . Diabetes mellitus type 2, controlled (HCC) 06/24/2018  . Dyspnea   . Essential hypertension 06/24/2018  . GERD (gastroesophageal reflux disease) 06/24/2018  . Heart murmur 06/24/2018  . Hyperlipidemia 06/24/2018  . Thoracic ascending aortic aneurysm [facility])    Past Surgical History:  Procedure Laterality Date  . BENTALL PROCEDURE N/A 09/10/2018   Procedure: BENTALL PROCEDURE USING HEMASHIELD 28, GELWEAVE VALSALVA , AND MAGNA EASE ;  Surgeon: [name], MD;  Location: [facility] OR;  Service: Open Heart Surgery;  Laterality: N/A;  . CHOLECYSTECTOMY    . RIGHT HEART CATH AND CORONARY ANGIOGRAPHY N/A 07/07/2018   Procedure: RIGHT HEART CATH AND CORONARY ANGIOGRAPHY;  Surgeon: Lennette Bihari, MD;  Location: [facility] INVASIVE CV LAB;  Service: Cardiovascular;  Laterality: N/A;  . TEE WITHOUT CARDIOVERSION N/A 09/10/2018   Procedure: TRANSESOPHAGEAL ECHOCARDIOGRAM (TEE);  Surgeon: [name], MD;  Location[facility] OR;  Service: Open Heart Surgery;  Laterality: N/A;  Family History  Problem Relation Age of Onset  . Lung cancer Mother   . Pulmonary disease Mother   . Alcoholism Father   . Anxiety disorder Father   . Hyperlipidemia Father   . Lung cancer Father   . CAD Father   . Renal cancer Father   . Pulmonary disease Father    Social History   Socioeconomic History  . Marital status: Married    Spouse name: Not on file  . Number of children: Not  on file  . Years of education: Not on file  . Highest education level: Not on file  Occupational History  . Not on file  Tobacco Use  . Smoking status: Never Smoker  . Smokeless tobacco: Current User    Types: Snuff  Vaping Use  . Vaping Use: Never used  Substance and Sexual Activity  . Alcohol use: Yes    Comment: daily  . Drug use: Never  . Sexual activity: Not on file  Other Topics Concern  . Not on file  Social History Narrative  . Not on file   Social Determinants of Health   Financial Resource Strain:   . Difficulty of Paying Living Expenses: Not on file  Food Insecurity:   . Worried About Programme researcher, broadcasting/film/video in the Last Year: Not on file  . Ran Out of Food in the Last Year: Not on file  Transportation Needs:   . Lack of Transportation (Medical): Not on file  . Lack of Transportation (Non-Medical): Not on file  Physical Activity:   . Days of Exercise per Week: Not on file  . Minutes of Exercise per Session: Not on file  Stress:   . Feeling of Stress : Not on file  Social Connections:   . Frequency of Communication with Friends and Family: Not on file  . Frequency of Social Gatherings with Friends and Family: Not on file  . Attends Religious Services: Not on file  . Active Member of Clubs or Organizations: Not on file  . Attends Banker Meetings: Not on file  . Marital Status: Not on file    Review of Systems  Constitutional: Negative.   HENT: Negative.   Eyes: Negative.   Respiratory: Negative for cough, shortness of breath and stridor.   Cardiovascular: Negative for chest pain, palpitations and leg swelling.  Gastrointestinal: Negative.   Genitourinary: Negative.   Musculoskeletal: Negative.   Skin: Negative.   Neurological: Negative.   Psychiatric/Behavioral: Negative.      Objective:  BP 122/68 (BP Location: Left Arm, Patient Position: Sitting, Cuff Size: Normal)   Pulse 70   Temp 97.8 F (36.6 C) (Temporal)   Ht 5\' 9"  (1.753 m)    Wt 213 lb 12.8 oz (97 kg)   SpO2 100%   BMI 31.57 kg/m   BP/Weight 02/28/2020 10/20/2019 01/11/2019  Systolic BP 122 135 132  Diastolic BP 68 81 60  Wt. (Lbs) 213.8 211 210.4  BMI 31.57 31.62 31.53    Physical Exam Vitals reviewed.  Constitutional:      Appearance: Normal appearance.  Cardiovascular:     Rate and Rhythm: Normal rate and regular rhythm.     Pulses: Normal pulses.     Heart sounds: Normal heart sounds.  Pulmonary:     Effort: Pulmonary effort is normal.     Breath sounds: Normal breath sounds.  Musculoskeletal:     Cervical back: Normal range of motion and neck supple.  Neurological:  Mental Status: He is alert.     Diabetic Foot Exam - Simple   Simple Foot Form Visual Inspection No deformities, no ulcerations, no other skin breakdown bilaterally: Yes Sensation Testing Intact to touch and monofilament testing bilaterally: Yes Pulse Check Posterior Tibialis and Dorsalis pulse intact bilaterally: Yes Comments      Lab Results  Component Value Date   WBC 7.4 09/15/2018   HGB 8.9 (L) 09/15/2018   HCT 26.2 (L) 09/15/2018   PLT 211 09/15/2018   GLUCOSE 129 (H) 09/13/2018   ALT 34 09/07/2018   AST 33 09/07/2018   NA 136 09/13/2018   K 3.6 09/13/2018   CL 101 09/13/2018   CREATININE 0.76 09/13/2018   BUN 9 09/13/2018   CO2 27 09/13/2018   INR 0.8 09/10/2018   HGBA1C 7.4 (H) 09/07/2018      Assessment & Plan:   1. Ascending aortic aneurysm (HCC) 41 mm as measured by echo Repaired in 2021.  2. Essential hypertension An individual hypertension care plan was established and reinforced today.  The patient's status was assessed using clinical findings on exam and labs or diagnostic tests. The patient's success at meeting treatment goals on disease specific evidence-based guidelines and found to be well controlled. SELF MANAGEMENT: The patient and I together assessed ways to personally work towards obtaining the recommended  goals. RECOMMENDATIONS: avoid decongestants found in common cold remedies, decrease consumption of alcohol, perform routine monitoring of BP with home BP cuff, exercise, reduction of dietary salt, take medicines as prescribed, try not to miss doses and quit smoking.  Regular exercise and maintaining a healthy weight is needed.  Stress reduction may help. A CLINICAL SUMMARY including written plan identify barriers to care unique to individual due to social or financial issues.  We attempt to mutually creat solutions for individual and family understanding.  3. Gastroesophageal reflux disease without esophagitis Plan of care was formulated today.  She is doing well.  A plan of care was formulated using patient exam, tests and other sources to optimize care using evidence based information.  Recommend no smoking, no eating after supper, avoid fatty foods, elevate Head of bed, avoid tight fitting clothing.  Continue on nexium.  4. Obesity, diabetes, and hypertension syndrome (HCC)  An individual care plan for diabetes was established and reinforced today.  The patient's status was assessed using clinical findings on exam, labs and diagnostic testing. Patient success at meeting goals based on disease specific evidence-based guidelines and found to be good controlled. Medications were assessed and patient's understanding of the medical issues , including barriers were assessed. Recommend adherence to a diabetic diet, a graduated exercise program, HgbA1c level is checked quarterly, and urine microalbumin performed yearly .  Annual mono-filament sensation testing performed. Lower blood pressure and control hyperlipidemia is important. Get annual eye exams and annual flu shots and smoking cessation discussed.  Self management goals were discussed.       Follow-up: Return in about 4 months (around 06/27/2020) for Fasting.  An After Visit Summary was printed and given to the patient.  Brent Bulla Cox  Family Practice [phone]

## 2020-02-29 LAB — CBC WITH DIFFERENTIAL/PLATELET
Basophils Absolute: 0.1 10*3/uL (ref 0.0–0.2)
Basos: 1 %
EOS (ABSOLUTE): 0.3 10*3/uL (ref 0.0–0.4)
Eos: 4 %
Hematocrit: 40 % (ref 37.5–51.0)
Hemoglobin: 13.7 g/dL (ref 13.0–17.7)
Immature Grans (Abs): 0 10*3/uL (ref 0.0–0.1)
Immature Granulocytes: 0 %
Lymphocytes Absolute: 2.3 10*3/uL (ref 0.7–3.1)
Lymphs: 33 %
MCH: 32.6 pg (ref 26.6–33.0)
MCHC: 34.3 g/dL (ref 31.5–35.7)
MCV: 95 fL (ref 79–97)
Monocytes Absolute: 0.4 10*3/uL (ref 0.1–0.9)
Monocytes: 6 %
Neutrophils Absolute: 4 10*3/uL (ref 1.4–7.0)
Neutrophils: 56 %
Platelets: 202 10*3/uL (ref 150–450)
RBC: 4.2 x10E6/uL (ref 4.14–5.80)
RDW: 12.3 % (ref 11.6–15.4)
WBC: 7.1 10*3/uL (ref 3.4–10.8)

## 2020-02-29 LAB — COMPREHENSIVE METABOLIC PANEL
ALT: 28 IU/L (ref 0–44)
AST: 29 IU/L (ref 0–40)
Albumin/Globulin Ratio: 1.9 (ref 1.2–2.2)
Albumin: 4.6 g/dL (ref 3.8–4.9)
Alkaline Phosphatase: 60 IU/L (ref 44–121)
BUN/Creatinine Ratio: 10 (ref 9–20)
BUN: 8 mg/dL (ref 6–24)
Bilirubin Total: 0.4 mg/dL (ref 0.0–1.2)
CO2: 24 mmol/L (ref 20–29)
Calcium: 9.9 mg/dL (ref 8.7–10.2)
Chloride: 100 mmol/L (ref 96–106)
Creatinine, Ser: 0.78 mg/dL (ref 0.76–1.27)
GFR calc Af Amer: 117 mL/min/{1.73_m2} (ref >59)
GFR calc non Af Amer: 102 mL/min/{1.73_m2} (ref >59)
Globulin, Total: 2.4 g/dL (ref 1.5–4.5)
Glucose: 120 mg/dL — ABNORMAL HIGH (ref 65–99)
Potassium: 5.2 mmol/L (ref 3.5–5.2)
Sodium: 139 mmol/L (ref 134–144)
Total Protein: 7 g/dL (ref 6.0–8.5)

## 2020-02-29 LAB — CARDIOVASCULAR RISK ASSESSMENT

## 2020-02-29 LAB — LIPID PANEL
Chol/HDL Ratio: 4 ratio (ref 0.0–5.0)
Cholesterol, Total: 192 mg/dL (ref 100–199)
HDL: 48 mg/dL (ref >39)
LDL Chol Calc (NIH): 105 mg/dL — ABNORMAL HIGH (ref 0–99)
Triglycerides: 226 mg/dL — ABNORMAL HIGH (ref 0–149)
VLDL Cholesterol Cal: 39 mg/dL (ref 5–40)

## 2020-02-29 LAB — HEMOGLOBIN A1C
Est. average glucose Bld gHb Est-mCnc: 166 mg/dL
Hgb A1c MFr Bld: 7.4 % — ABNORMAL HIGH (ref 4.8–5.6)

## 2020-02-29 NOTE — Progress Notes (Signed)
Chronic anemia  add ferritin, b12 and folate , glucose 120, kidney tests normal, liver tests normal, A1c 7.4 OK, LDL-c 105 take simvastatin daily lp

## 2020-03-01 LAB — SPECIMEN STATUS REPORT

## 2020-03-01 LAB — B12 AND FOLATE PANEL
Folate: >20 ng/mL (ref >3.0)
Vitamin B-12: 413 pg/mL (ref 232–1245)

## 2020-03-01 LAB — FERRITIN: Ferritin: 64 ng/mL (ref 30–400)

## 2020-03-01 NOTE — Progress Notes (Signed)
B12 and folate level normal lp

## 2020-03-03 ENCOUNTER — Other Ambulatory Visit: Payer: Self-pay | Admitting: Physician Assistant

## 2020-03-03 ENCOUNTER — Telehealth: Payer: Self-pay | Admitting: Cardiology

## 2020-03-03 MED ORDER — LISINOPRIL 10 MG PO TABS: 10.0000 mg | ORAL_TABLET | Freq: Every day | ORAL | 0 refills | Status: DC

## 2020-03-03 NOTE — Telephone Encounter (Signed)
*  STAT* If patient is at the pharmacy, call can be transferred to refill team.   1. Which medications need to be refilled? (please list name of each medication and dose if known) Lisinopril  2. Which pharmacy/location (including street and city if local pharmacy) is medication to be sent to? [facility]  [place]  3. Do they need a 30 day or 90 day supply? #15

## 2020-03-03 NOTE — Telephone Encounter (Signed)
Temporary refill has been sent to the patient's pharmacy, more refill can be given once he is seen by Dr. Bing Matter next Monday

## 2020-03-06 ENCOUNTER — Encounter: Payer: Self-pay | Admitting: Cardiology

## 2020-03-06 ENCOUNTER — Other Ambulatory Visit: Payer: Self-pay

## 2020-03-06 ENCOUNTER — Ambulatory Visit: Payer: Commercial Managed Care - PPO | Admitting: Cardiology

## 2020-03-06 VITALS — BP 142/70 | HR 74 | Ht 68.5 in | Wt 215.0 lb

## 2020-03-06 DIAGNOSIS — Q231 Congenital insufficiency of aortic valve: Secondary | ICD-10-CM | POA: Diagnosis not present

## 2020-03-06 DIAGNOSIS — I10 Essential (primary) hypertension: Secondary | ICD-10-CM | POA: Diagnosis not present

## 2020-03-06 DIAGNOSIS — Z952 Presence of prosthetic heart valve: Secondary | ICD-10-CM | POA: Diagnosis not present

## 2020-03-06 NOTE — Progress Notes (Signed)
Cardiology Office Note:    Date:  03/06/2020   ID:  Ronnie Ruiz, DOB 02-18-1966, MRN [id number]  PCP:  [name], MD  Cardiologist:  Gypsy Balsam, MD    Referring MD: [name],*   Chief Complaint  Patient presents with  . Follow-up  I am doing fine  History of Present Illness:    Ronnie Ruiz is a 54 y.o. male status post biological Bentall Procedure using a 53mm Edwards Magna-Ease pericardial valve and a 23mm Gelweave Valsalva graft secondary to bicuspid aortic valve with critical aortic stenosis, enlarged aorta. His additional past medical history includes essential hypertension, diabetes, dyslipidemia.  He comes today to my office for follow-up.  He is doing well he still work with no difficulties.  He complained of sometimes being weak and tired but overall seems to be doing well.  Past Medical History:  Diagnosis Date  . Alcoholism (HCC) 06/24/2018  . Aortic stenosis   . Diabetes mellitus type 2, controlled (HCC) 06/24/2018  . Dyspnea   . Essential hypertension 06/24/2018  . GERD (gastroesophageal reflux disease) 06/24/2018  . Heart murmur 06/24/2018  . Hyperlipidemia 06/24/2018  . Thoracic ascending aortic aneurysm [facility])     Past Surgical History:  Procedure Laterality Date  . BENTALL PROCEDURE N/A 09/10/2018   Procedure: BENTALL PROCEDURE USING HEMASHIELD 28, GELWEAVE VALSALVA , AND MAGNA EASE ;  Surgeon: [name], MD;  [facility]: [facility] OR;  Service: Open Heart Surgery;  Laterality: N/A;  . CHOLECYSTECTOMY    . RIGHT HEART CATH AND CORONARY ANGIOGRAPHY N/A 07/07/2018   Procedure: RIGHT HEART CATH AND CORONARY ANGIOGRAPHY;  Surgeon: Lennette Bihari, MD;  Location: [facility] INVASIVE CV LAB;  Service: Cardiovascular;  Laterality: N/A;  . TEE WITHOUT CARDIOVERSION N/A 09/10/2018   Procedure: TRANSESOPHAGEAL ECHOCARDIOGRAM (TEE);  Surgeon: [name], MD;  Location: [facility] OR;  Service: Open Heart Surgery;  Laterality: N/A;    Current  Medications: Current Meds  Medication Sig  . aspirin EC 325 MG EC tablet Take 1 tablet (325 mg total) by mouth daily.  Marland Kitchen docusate sodium (COLACE) 250 MG capsule Take 250 mg by mouth daily as needed for constipation.  Marland Kitchen esomeprazole (NEXIUM) 20 MG capsule Take 20 mg by mouth every evening.  Marland Kitchen lisinopril (ZESTRIL) 10 MG tablet Take 1 tablet (10 mg total) by mouth daily. Take 1/2 tablet (5 MG) by mouth daily,  Needs appointment for more refills.  . metFORMIN (GLUCOPHAGE) 500 MG tablet TAKE 1 TABLET BY MOUTH TWICE DAILY  . metoprolol succinate (TOPROL-XL) 50 MG 24 hr tablet TAKE 1 TABLET BY MOUTH ONCE DAILY.  Marland Kitchen Omega-3 Fatty Acids (FISH OIL) 1000 MG CAPS Take 1,000 mg by mouth 2 (two) times daily.   . simvastatin (ZOCOR) 40 MG tablet TAKE 1 TABLET BY MOUTH EVERY DAY     Allergies:   Patient has no known allergies.   Social History   Socioeconomic History  . Marital status: Married    Spouse name: Not on file  . Number of children: Not on file  . Years of education: Not on file  . Highest education level: Not on file  Occupational History  . Not on file  Tobacco Use  . Smoking status: Never Smoker  . Smokeless tobacco: Current User    Types: Snuff  Vaping Use  . Vaping Use: Never used  Substance and Sexual Activity  . Alcohol use: Yes    Comment: daily  . Drug use: Never  .  Sexual activity: Not on file  Other Topics Concern  . Not on file  Social History Narrative  . Not on file   Social Determinants of Health   Financial Resource Strain:   . Difficulty of Paying Living Expenses: Not on file  Food Insecurity:   . Worried About Programme researcher, broadcasting/film/video in the Last Year: Not on file  . Ran Out of Food in the Last Year: Not on file  Transportation Needs:   . Lack of Transportation (Medical): Not on file  . Lack of Transportation (Non-Medical): Not on file  Physical Activity:   . Days of Exercise per Week: Not on file  . Minutes of Exercise per Session: Not on file  Stress:     . Feeling of Stress : Not on file  Social Connections:   . Frequency of Communication with Friends and Family: Not on file  . Frequency of Social Gatherings with Friends and Family: Not on file  . Attends Religious Services: Not on file  . Active Member of Clubs or Organizations: Not on file  . Attends Banker Meetings: Not on file  . Marital Status: Not on file     Family History: The patient's family history includes Alcoholism in his father; Anxiety disorder in his father; CAD in his father; Hyperlipidemia in his father; Lung cancer in his father and mother; Pulmonary disease in his father and mother; Renal cancer in his father. ROS:   Please see the history of present illness.    All 14 point review of systems negative except as described per history of present illness  EKGs/Labs/Other Studies Reviewed:      Recent Labs: 02/28/2020: ALT 28; BUN 8; Creatinine, Ser 0.78; Hemoglobin 13.7; Platelets 202; Potassium 5.2; Sodium 139  Recent Lipid Panel    Component Value Date/Time   CHOL 192 02/28/2020 0821   TRIG 226 (H) 02/28/2020 0821   HDL 48 02/28/2020 0821   CHOLHDL 4.0 02/28/2020 0821   LDLCALC 105 (H) 02/28/2020 0821    Physical Exam:    VS:  BP (!) 142/70 (BP Location: Right Arm, Patient Position: Sitting)   Pulse 74   Ht 5' 8.5" (1.74 m)   Wt 215 lb (97.5 kg)   SpO2 94%   BMI 32.22 kg/m     Wt Readings from Last 3 Encounters:  03/06/20 215 lb (97.5 kg)  02/28/20 213 lb 12.8 oz (97 kg)  10/20/19 211 lb (95.7 kg)     GEN:  Well nourished, well developed in no acute distress HEENT: Normal NECK: No JVD; No carotid bruits LYMPHATICS: No lymphadenopathy CARDIAC: RRR, systolic ejection murmur grade 2/6 best heard right upper portion of the sternum, S2 is nice and crisp, no rubs, no gallops RESPIRATORY:  Clear to auscultation without rales, wheezing or rhonchi  ABDOMEN: Soft, non-tender, non-distended MUSCULOSKELETAL:  No edema; No deformity  SKIN:  Warm and dry LOWER EXTREMITIES: no swelling NEUROLOGIC:  Alert and oriented x 3 PSYCHIATRIC:  Normal affect   ASSESSMENT:    1. S/P AVR   2. Essential hypertension   3. Bicuspid aortic valve    PLAN:    In order of problems listed above:  1. S/p aortic valve replacement with aortic root replacement secondary to bicuspid arctic valve and enlarged aorta.  Doing well from that point.  Asymptomatic, I did review note by surgeon recently he did have CT of his chest done which showed stable repair, there was small outpouching noted proximal to the  arch anastomosis however this is secondary to cysts small residual perfusion SideArm graft that was suture-ligated with a short stem.  That was not pseudoaneurysm.  Overall he is doing very well pressures well controlled continue present management. 2. Essential hypertension slightly elevated today in the office but he tells me every single time when he check it he see pressure of 120 systolic we will continue present management which include ACE inhibitor as well as beta-blocker. 3. Bicuspid arctic valve status post aortic valve replacement, I will schedule him to have echocardiogram to look at the valves. 4. Dyslipidemia he is on simvastatin 40, I did review his K PN K PN show me his LDL of 105 HDL 48.  I would like to see LDL less than 100, I will switch him from simvastatin to rosuvastatin 20 mg daily, fasting lipid profile will be checked within the next few weeks. 5. We did talk about healthy lifestyle need to exercise on the regular basis with avoidance of isovolumetric exercises.  I did review cardiothoracic surgeon note for this visit.   Medication Adjustments/Labs and Tests Ordered: Current medicines are reviewed at length with the patient today.  Concerns regarding medicines are outlined above.  No orders of the defined types were placed in this encounter.  Medication changes: No orders of the defined types were placed in this  encounter.   Signed, Ronnie Lea, MD, [facility] 03/06/2020 11:15 AM    [facility]

## 2020-03-06 NOTE — Patient Instructions (Signed)
Medication Instructions:  Your physician recommends that you continue on your current medications as directed. Please refer to the Current Medication list given to you today.  *If you need a refill on your cardiac medications before your next appointment, please call your pharmacy*   Lab Work: none If you have labs (blood work) drawn today and your tests are completely normal, you will receive your results only by: . MyChart Message (if you have MyChart) OR . A paper copy in the mail If you have any lab test that is abnormal or we need to change your treatment, we will call you to review the results.   Testing/Procedures: Your physician has requested that you have an echocardiogram. Echocardiography is a painless test that uses sound waves to create images of your heart. It provides your doctor with information about the size and shape of your heart and how well your heart's chambers and valves are working. This procedure takes approximately one hour. There are no restrictions for this procedure.    Follow-Up: At [facility], you and your health needs are our priority.  As part of our continuing mission to provide you with exceptional heart care, we have created designated Provider Care Teams.  These Care Teams include your primary Cardiologist (physician) and Advanced Practice Providers (APPs -  Physician Assistants and Nurse Practitioners) who all work together to provide you with the care you need, when you need it.  We recommend signing up for the patient portal called "MyChart".  Sign up information is provided on this After Visit Summary.  MyChart is used to connect with patients for Virtual Visits (Telemedicine).  Patients are able to view lab/test results, encounter notes, upcoming appointments, etc.  Non-urgent messages can be sent to your provider as well.   To learn more about what you can do with MyChart, go to [email or web address].    Your next appointment:   1  year(s)  The format for your next appointment:   In Person  Provider:   Robert Krasowski, MD   Other Instructions   Echocardiogram An echocardiogram is a procedure that uses painless sound waves (ultrasound) to produce an image of the heart. Images from an echocardiogram can provide important information about:  Signs of coronary artery disease (CAD).  Aneurysm detection. An aneurysm is a weak or damaged part of an artery wall that bulges out from the normal force of blood pumping through the body.  Heart size and shape. Changes in the size or shape of the heart can be associated with certain conditions, including heart failure, aneurysm, and CAD.  Heart muscle function.  Heart valve function.  Signs of a past heart attack.  Fluid buildup around the heart.  Thickening of the heart muscle.  A tumor or infectious growth around the heart valves. Tell a health care provider about:  Any allergies you have.  All medicines you are taking, including vitamins, herbs, eye drops, creams, and over-the-counter medicines.  Any blood disorders you have.  Any surgeries you have had.  Any medical conditions you have.  Whether you are pregnant or may be pregnant. What are the risks? Generally, this is a safe procedure. However, problems may occur, including:  Allergic reaction to dye (contrast) that may be used during the procedure. What happens before the procedure? No specific preparation is needed. You may eat and drink normally. What happens during the procedure?   An IV tube may be inserted into one of your veins.  You may receive contrast   contrast through this tube. A contrast is an injection that improves the quality of the pictures from your heart.  A gel will be applied to your chest.  A wand-like tool (transducer) will be moved over your chest. The gel will help to transmit the sound waves from the transducer.  The sound waves will harmlessly bounce off of your heart to  allow the heart images to be captured in real-time motion. The images will be recorded on a computer. The procedure may vary among health care providers and hospitals. What happens after the procedure?  You may return to your normal, everyday life, including diet, activities, and medicines, unless your health care provider tells you not to do that. Summary  An echocardiogram is a procedure that uses painless sound waves (ultrasound) to produce an image of the heart.  Images from an echocardiogram can provide important information about the size and shape of your heart, heart muscle function, heart valve function, and fluid buildup around your heart.  You do not need to do anything to prepare before this procedure. You may eat and drink normally.  After the echocardiogram is completed, you may return to your normal, everyday life, unless your health care provider tells you not to do that. This information is not intended to replace advice given to you by your health care provider. Make sure you discuss any questions you have with your health care provider. Document Revised: 07/30/2018 Document Reviewed: 05/11/2016 Elsevier Patient Education  2020 [employer]   

## 2020-03-29 ENCOUNTER — Other Ambulatory Visit: Payer: Self-pay | Admitting: Physician Assistant

## 2020-04-03 ENCOUNTER — Other Ambulatory Visit: Payer: Self-pay

## 2020-04-03 ENCOUNTER — Ambulatory Visit (INDEPENDENT_AMBULATORY_CARE_PROVIDER_SITE_OTHER): Payer: Commercial Managed Care - PPO

## 2020-04-03 ENCOUNTER — Other Ambulatory Visit: Payer: Self-pay | Admitting: Physician Assistant

## 2020-04-03 DIAGNOSIS — Q231 Congenital insufficiency of aortic valve: Secondary | ICD-10-CM | POA: Diagnosis not present

## 2020-04-03 LAB — ECHOCARDIOGRAM COMPLETE
AR max vel: 1.2 cm2
AV Area VTI: 1.33 cm2
AV Area mean vel: 1.32 cm2
AV Mean grad: 19 mmHg
AV Peak grad: 37 mmHg
Ao pk vel: 3.04 m/s
Area-P 1/2: 3.42 cm2
S' Lateral: 4 cm

## 2020-04-03 NOTE — Progress Notes (Signed)
Complete echocardiogram has been performed.  Jimmy Maxten Shuler RDCS, RVT 

## 2020-04-22 HISTORY — PX: OTHER SURGICAL HISTORY: SHX169

## 2020-05-29 ENCOUNTER — Other Ambulatory Visit: Payer: Self-pay | Admitting: Legal Medicine

## 2020-06-26 ENCOUNTER — Other Ambulatory Visit: Payer: Self-pay | Admitting: Legal Medicine

## 2020-08-18 ENCOUNTER — Other Ambulatory Visit: Payer: Self-pay

## 2020-08-18 ENCOUNTER — Ambulatory Visit: Payer: Managed Care, Other (non HMO) | Admitting: Legal Medicine

## 2020-08-18 ENCOUNTER — Encounter: Payer: Self-pay | Admitting: Legal Medicine

## 2020-08-18 VITALS — BP 140/70 | HR 88 | Temp 97.1°F | Resp 16 | Ht 68.5 in | Wt 208.0 lb

## 2020-08-18 DIAGNOSIS — I712 Thoracic aortic aneurysm, without rupture: Secondary | ICD-10-CM | POA: Diagnosis not present

## 2020-08-18 DIAGNOSIS — M7502 Adhesive capsulitis of left shoulder: Secondary | ICD-10-CM

## 2020-08-18 DIAGNOSIS — F1020 Alcohol dependence, uncomplicated: Secondary | ICD-10-CM | POA: Diagnosis not present

## 2020-08-18 DIAGNOSIS — E669 Obesity, unspecified: Secondary | ICD-10-CM

## 2020-08-18 DIAGNOSIS — E1159 Type 2 diabetes mellitus with other circulatory complications: Secondary | ICD-10-CM

## 2020-08-18 DIAGNOSIS — I152 Hypertension secondary to endocrine disorders: Secondary | ICD-10-CM

## 2020-08-18 DIAGNOSIS — E1169 Type 2 diabetes mellitus with other specified complication: Secondary | ICD-10-CM | POA: Diagnosis not present

## 2020-08-18 DIAGNOSIS — I7121 Aneurysm of the ascending aorta, without rupture: Secondary | ICD-10-CM

## 2020-08-18 MED ORDER — HYDROCODONE-ACETAMINOPHEN 10-325 MG PO TABS: 1.0000 | ORAL_TABLET | Freq: Three times a day (TID) | ORAL | 0 refills | Status: AC | PRN

## 2020-08-18 NOTE — Progress Notes (Signed)
Subjective:  Patient ID: Ronnie Ruiz, male    DOB: 12/16/1965  Age: 55 y.o. MRN: [id number]  Chief Complaint  Patient presents with  . Shoulder Pain    Left shoulder pain since 2 days ago    HPI: left shoulder pain for 2 days.  He has a history of left shoulder arthropathy.  He is right handed.  He can play golf.   Current Outpatient Medications on File Prior to Visit  Medication Sig Dispense Refill  . aspirin EC 325 MG EC tablet Take 1 tablet (325 mg total) by mouth daily.    Marland Kitchen docusate sodium (COLACE) 250 MG capsule Take 250 mg by mouth daily as needed for constipation.    Marland Kitchen esomeprazole (NEXIUM) 20 MG capsule Take 20 mg by mouth every evening.    Marland Kitchen lisinopril (ZESTRIL) 10 MG tablet TAKE 1/2 TABLET( 5 MG) BY MOUTH DAILY. NEEDS APPOINTMENT FOR MORE REFILLS 15 tablet 0  . metFORMIN (GLUCOPHAGE) 500 MG tablet TAKE 1 TABLET BY MOUTH TWICE DAILY 420 tablet 1  . metoprolol succinate (TOPROL-XL) 50 MG 24 hr tablet TAKE 1 TABLET BY MOUTH ONCE DAILY. 30 tablet 6  . Omega-3 Fatty Acids (FISH OIL) 1000 MG CAPS Take 1,000 mg by mouth 2 (two) times daily.     . simvastatin (ZOCOR) 40 MG tablet TAKE 1 TABLET BY MOUTH EVERY DAY 30 tablet 6   No current facility-administered medications on file prior to visit.   Past Medical History:  Diagnosis Date  . Alcoholism (HCC) 06/24/2018  . Aortic stenosis   . Diabetes mellitus type 2, controlled (HCC) 06/24/2018  . Dyspnea   . Essential hypertension 06/24/2018  . GERD (gastroesophageal reflux disease) 06/24/2018  . Heart murmur 06/24/2018  . Hyperlipidemia 06/24/2018  . Thoracic ascending aortic aneurysm [facility])    Past Surgical History:  Procedure Laterality Date  . BENTALL PROCEDURE N/A 09/10/2018   Procedure: BENTALL PROCEDURE USING HEMASHIELD 28, GELWEAVE VALSALVA , AND MAGNA EASE ;  Surgeon: [name], MD;  Location: [facility] OR;  Service: Open Heart Surgery;  Laterality: N/A;  . CHOLECYSTECTOMY    . RIGHT HEART CATH AND CORONARY ANGIOGRAPHY  N/A 07/07/2018   Procedure: RIGHT HEART CATH AND CORONARY ANGIOGRAPHY;  Surgeon: Lennette Bihari, MD;  Location: [facility] INVASIVE CV LAB;  Service: Cardiovascular;  Laterality: N/A;  . TEE WITHOUT CARDIOVERSION N/A 09/10/2018   Procedure: TRANSESOPHAGEAL ECHOCARDIOGRAM (TEE);  Surgeon: [name], MD;  Location: [facility] OR;  Service: Open Heart Surgery;  Laterality: N/A;    Family History  Problem Relation Age of Onset  . Lung cancer Mother   . Pulmonary disease Mother   . Alcoholism Father   . Anxiety disorder Father   . Hyperlipidemia Father   . Lung cancer Father   . CAD Father   . Renal cancer Father   . Pulmonary disease Father    Social History   Socioeconomic History  . Marital status: Married    Spouse name: Not on file  . Number of children: Not on file  . Years of education: Not on file  . Highest education level: Not on file  Occupational History  . Not on file  Tobacco Use  . Smoking status: Never Smoker  . Smokeless tobacco: Current User    Types: Snuff  Vaping Use  . Vaping Use: Never used  Substance and Sexual Activity  . Alcohol use: Yes    Alcohol/week: 2.0 standard drinks    Types: 2 Cans of  beer per week    Comment: daily  . Drug use: Never  . Sexual activity: Not on file  Other Topics Concern  . Not on file  Social History Narrative  . Not on file   Social Determinants of Health   Financial Resource Strain: Not on file  Food Insecurity: Not on file  Transportation Needs: Not on file  Physical Activity: Not on file  Stress: Not on file  Social Connections: Not on file    Review of Systems  Constitutional: Negative for activity change and appetite change.  HENT: Negative.   Eyes: Negative for visual disturbance.  Respiratory: Negative for chest tightness and shortness of breath.   Cardiovascular: Negative for chest pain, palpitations and leg swelling.  Genitourinary: Negative for difficulty urinating and dysuria.  Musculoskeletal: Positive for  arthralgias.  Skin: Negative.   Neurological: Negative.   Psychiatric/Behavioral: Negative.      Objective:  BP 140/70   Pulse 88   Temp (!) 97.1 F (36.2 C)   Resp 16   Ht 5' 8.5" (1.74 m)   Wt 208 lb (94.3 kg)   BMI 31.17 kg/m   BP/Weight 08/18/2020 03/06/2020 02/28/2020  Systolic BP 140 142 122  Diastolic BP 70 70 68  Wt. (Lbs) 208 215 213.8  BMI 31.17 32.22 31.57    Physical Exam Vitals reviewed.  Constitutional:      Appearance: Normal appearance.  HENT:     Head: Normocephalic.  Cardiovascular:     Rate and Rhythm: Normal rate and regular rhythm.     Pulses: Normal pulses.     Heart sounds: Murmur heard.  No gallop.   Pulmonary:     Effort: Pulmonary effort is normal.     Breath sounds: Normal breath sounds. No rales.  Musculoskeletal:        General: Tenderness present.       Arms:     Comments: Flexion to 30 degrees, abduction 80 degrees, IR and ER 30 degrees each  Neurological:     Mental Status: He is alert.       Lab Results  Component Value Date   WBC 7.1 02/28/2020   HGB 13.7 02/28/2020   HCT 40.0 02/28/2020   PLT 202 02/28/2020   GLUCOSE 120 (H) 02/28/2020   CHOL 192 02/28/2020   TRIG 226 (H) 02/28/2020   HDL 48 02/28/2020   LDLCALC 105 (H) 02/28/2020   ALT 28 02/28/2020   AST 29 02/28/2020   NA 139 02/28/2020   K 5.2 02/28/2020   CL 100 02/28/2020   CREATININE 0.78 02/28/2020   BUN 8 02/28/2020   CO2 24 02/28/2020   INR 0.8 09/10/2018   HGBA1C 7.4 (H) 02/28/2020      Assessment & Plan:   Diagnoses and all orders for this visit: Adhesive capsulitis of left shoulder -     HYDROcodone-acetaminophen (NORCO) 10-325 MG tablet; Take 1 tablet by mouth every 8 (eight) hours as needed for up to 5 days. Patient has rotator cuff bursitis with some adhesive capsulitis, inject with kenalog and ROM exercises  Alcoholism (HCC) Patient continues to drink 6 drinks a day, we discussed at length  Obesity, diabetes, and hypertension  syndrome (HCC) An individual care plan for diabetes was established and reinforced today.  The patient's status was assessed using clinical findings on exam, labs and diagnostic testing. Patient success at meeting goals based on disease specific evidence-based guidelines and found to be fair controlled. Medications were assessed and patient's understanding of  the medical issues , including barriers were assessed. Recommend adherence to a diabetic diet, a graduated exercise program, HgbA1c level is checked quarterly, and urine microalbumin performed yearly .  Annual mono-filament sensation testing performed. Lower blood pressure and control hyperlipidemia is important. Get annual eye exams and annual flu shots and smoking cessation discussed.  Self management goals were discussed.  Ascending aortic aneurysm (HCC) Found on chest CT, follow yearly         Follow-up: Return in about 2 weeks (around 09/01/2020).  An After Visit Summary was printed and given to the patient.  Brent Bulla, MD [facility] [phone]

## 2020-08-18 NOTE — Assessment & Plan Note (Signed)
Drinking 6 beers a day

## 2020-08-18 NOTE — Patient Instructions (Signed)
sh Shoulder Exercises Ask your health care provider which exercises are safe for you. Do exercises exactly as told by your health care provider and adjust them as directed. It is normal to feel mild stretching, pulling, tightness, or discomfort as you do these exercises. Stop right away if you feel sudden pain or your pain gets worse. Do not begin these exercises until told by your health care provider. Stretching exercises External rotation and abduction This exercise is sometimes called corner stretch. This exercise rotates your arm outward (external rotation) and moves your arm out from your body (abduction). 1. Stand in a doorway with one of your feet slightly in front of the other. This is called a staggered stance. If you cannot reach your forearms to the door frame, stand facing a corner of a room. 2. Choose one of the following positions as told by your health care provider: ? Place your hands and forearms on the door frame above your head. ? Place your hands and forearms on the door frame at the height of your head. ? Place your hands on the door frame at the height of your elbows. 3. Slowly move your weight onto your front foot until you feel a stretch across your chest and in the front of your shoulders. Keep your head and chest upright and keep your abdominal muscles tight. 4. Hold for __________ seconds. 5. To release the stretch, shift your weight to your back foot. Repeat __________ times. Complete this exercise __________ times a day.   Extension, standing 1. Stand and hold a broomstick, a cane, or a similar object behind your back. ? Your hands should be a little wider than shoulder width apart. ? Your palms should face away from your back. 2. Keeping your elbows straight and your shoulder muscles relaxed, move the stick away from your body until you feel a stretch in your shoulders (extension). ? Avoid shrugging your shoulders while you move the stick. Keep your shoulder blades  tucked down toward the middle of your back. 3. Hold for __________ seconds. 4. Slowly return to the starting position. Repeat __________ times. Complete this exercise __________ times a day. Range-of-motion exercises Pendulum 1. Stand near a wall or a surface that you can hold onto for balance. 2. Bend at the waist and let your left / right arm hang straight down. Use your other arm to support you. Keep your back straight and do not lock your knees. 3. Relax your left / right arm and shoulder muscles, and move your hips and your trunk so your left / right arm swings freely. Your arm should swing because of the motion of your body, not because you are using your arm or shoulder muscles. 4. Keep moving your hips and trunk so your arm swings in the following directions, as told by your health care provider: ? Side to side. ? Forward and backward. ? In clockwise and counterclockwise circles. 5. Continue each motion for __________ seconds, or for as long as told by your health care provider. 6. Slowly return to the starting position. Repeat __________ times. Complete this exercise __________ times a day.   Shoulder flexion, standing 1. Stand and hold a broomstick, a cane, or a similar object. Place your hands a little more than shoulder width apart on the object. Your left / right hand should be palm up, and your other hand should be palm down. 2. Keep your elbow straight and your shoulder muscles relaxed. Push the stick up with your  healthy arm to raise your left / right arm in front of your body, and then over your head until you feel a stretch in your shoulder (flexion). ? Avoid shrugging your shoulder while you raise your arm. Keep your shoulder blade tucked down toward the middle of your back. 3. Hold for __________ seconds. 4. Slowly return to the starting position. Repeat __________ times. Complete this exercise __________ times a day.   Shoulder abduction, standing 1. Stand and hold a  broomstick, a cane, or a similar object. Place your hands a little more than shoulder width apart on the object. Your left / right hand should be palm up, and your other hand should be palm down. 2. Keep your elbow straight and your shoulder muscles relaxed. Push the object across your body toward your left / right side. Raise your left / right arm to the side of your body (abduction) until you feel a stretch in your shoulder. ? Do not raise your arm above shoulder height unless your health care provider tells you to do that. ? If directed, raise your arm over your head. ? Avoid shrugging your shoulder while you raise your arm. Keep your shoulder blade tucked down toward the middle of your back. 3. Hold for __________ seconds. 4. Slowly return to the starting position. Repeat __________ times. Complete this exercise __________ times a day. Internal rotation 1. Place your left / right hand behind your back, palm up. 2. Use your other hand to dangle an exercise band, a towel, or a similar object over your shoulder. Grasp the band with your left / right hand so you are holding on to both ends. 3. Gently pull up on the band until you feel a stretch in the front of your left / right shoulder. The movement of your arm toward the center of your body is called internal rotation. ? Avoid shrugging your shoulder while you raise your arm. Keep your shoulder blade tucked down toward the middle of your back. 4. Hold for __________ seconds. 5. Release the stretch by letting go of the band and lowering your hands. Repeat __________ times. Complete this exercise __________ times a day.   Strengthening exercises External rotation 1. Sit in a stable chair without armrests. 2. Secure an exercise band to a stable object at elbow height on your left / right side. 3. Place a soft object, such as a folded towel or a small pillow, between your left / right upper arm and your body to move your elbow about 4 inches (10  cm) away from your side. 4. Hold the end of the exercise band so it is tight and there is no slack. 5. Keeping your elbow pressed against the soft object, slowly move your forearm out, away from your abdomen (external rotation). Keep your body steady so only your forearm moves. 6. Hold for __________ seconds. 7. Slowly return to the starting position. Repeat __________ times. Complete this exercise __________ times a day.   Shoulder abduction 1. Sit in a stable chair without armrests, or stand up. 2. Hold a __________ weight in your left / right hand, or hold an exercise band with both hands. 3. Start with your arms straight down and your left / right palm facing in, toward your body. 4. Slowly lift your left / right hand out to your side (abduction). Do not lift your hand above shoulder height unless your health care provider tells you that this is safe. ? Keep your arms straight. ?  Avoid shrugging your shoulder while you do this movement. Keep your shoulder blade tucked down toward the middle of your back. 5. Hold for __________ seconds. 6. Slowly lower your arm, and return to the starting position. Repeat __________ times. Complete this exercise __________ times a day.   Shoulder extension 1. Sit in a stable chair without armrests, or stand up. 2. Secure an exercise band to a stable object in front of you so it is at shoulder height. 3. Hold one end of the exercise band in each hand. Your palms should face each other. 4. Straighten your elbows and lift your hands up to shoulder height. 5. Step back, away from the secured end of the exercise band, until the band is tight and there is no slack. 6. Squeeze your shoulder blades together as you pull your hands down to the sides of your thighs (extension). Stop when your hands are straight down by your sides. Do not let your hands go behind your body. 7. Hold for __________ seconds. 8. Slowly return to the starting position. Repeat __________  times. Complete this exercise __________ times a day. Shoulder row 1. Sit in a stable chair without armrests, or stand up. 2. Secure an exercise band to a stable object in front of you so it is at waist height. 3. Hold one end of the exercise band in each hand. Position your palms so that your thumbs are facing the ceiling (neutral position). 4. Bend each of your elbows to a 90-degree angle (right angle) and keep your upper arms at your sides. 5. Step back until the band is tight and there is no slack. 6. Slowly pull your elbows back behind you. 7. Hold for __________ seconds. 8. Slowly return to the starting position. Repeat __________ times. Complete this exercise __________ times a day. Shoulder press-ups 1. Sit in a stable chair that has armrests. Sit upright, with your feet flat on the floor. 2. Put your hands on the armrests so your elbows are bent and your fingers are pointing forward. Your hands should be about even with the sides of your body. 3. Push down on the armrests and use your arms to lift yourself off the chair. Straighten your elbows and lift yourself up as much as you comfortably can. ? Move your shoulder blades down, and avoid letting your shoulders move up toward your ears. ? Keep your feet on the ground. As you get stronger, your feet should support less of your body weight as you lift yourself up. 4. Hold for __________ seconds. 5. Slowly lower yourself back into the chair. Repeat __________ times. Complete this exercise __________ times a day.   Wall push-ups 1. Stand so you are facing a stable wall. Your feet should be about one arm-length away from the wall. 2. Lean forward and place your palms on the wall at shoulder height. 3. Keep your feet flat on the floor as you bend your elbows and lean forward toward the wall. 4. Hold for __________ seconds. 5. Straighten your elbows to push yourself back to the starting position. Repeat __________ times. Complete this  exercise __________ times a day.   This information is not intended to replace advice given to you by your health care provider. Make sure you discuss any questions you have with your health care provider. Document Revised: 07/31/2018 Document Reviewed: 05/08/2018 Elsevier Patient Education  2021 [employer].

## 2020-08-20 ENCOUNTER — Encounter: Payer: Self-pay | Admitting: Legal Medicine

## 2020-08-29 ENCOUNTER — Other Ambulatory Visit: Payer: Self-pay | Admitting: Legal Medicine

## 2020-08-31 ENCOUNTER — Ambulatory Visit: Payer: Managed Care, Other (non HMO) | Admitting: Legal Medicine

## 2020-08-31 ENCOUNTER — Encounter: Payer: Self-pay | Admitting: Legal Medicine

## 2020-08-31 ENCOUNTER — Other Ambulatory Visit: Payer: Self-pay

## 2020-08-31 DIAGNOSIS — M7502 Adhesive capsulitis of left shoulder: Secondary | ICD-10-CM

## 2020-08-31 HISTORY — DX: Adhesive capsulitis of left shoulder: M75.02

## 2020-08-31 NOTE — Progress Notes (Signed)
Subjective:  Patient ID: Ronnie Ruiz, male    DOB: Feb 15, 1966  Age: 55 y.o. MRN: [id number]  Chief Complaint  Patient presents with  . Follow-up    HPI: left shoulder moving well and no pain. He is able to play golf without pain.  No limitation is activity.   Current Outpatient Medications on File Prior to Visit  Medication Sig Dispense Refill  . aspirin EC 325 MG EC tablet Take 1 tablet (325 mg total) by mouth daily.    Marland Kitchen docusate sodium (COLACE) 250 MG capsule Take 250 mg by mouth daily as needed for constipation.    Marland Kitchen esomeprazole (NEXIUM) 20 MG capsule Take 20 mg by mouth every evening.    Marland Kitchen lisinopril (ZESTRIL) 10 MG tablet TAKE 1/2 TABLET( 5 MG) BY MOUTH DAILY. NEEDS APPOINTMENT FOR MORE REFILLS 15 tablet 0  . metFORMIN (GLUCOPHAGE) 500 MG tablet TAKE 1 TABLET BY MOUTH TWICE DAILY 420 tablet 1  . metoprolol succinate (TOPROL-XL) 50 MG 24 hr tablet TAKE 1 TABLET BY MOUTH ONCE DAILY. 30 tablet 6  . Omega-3 Fatty Acids (FISH OIL) 1000 MG CAPS Take 1,000 mg by mouth 2 (two) times daily.     . simvastatin (ZOCOR) 40 MG tablet TAKE 1 TABLET BY MOUTH EVERY DAY 30 tablet 6   No current facility-administered medications on file prior to visit.   Past Medical History:  Diagnosis Date  . Alcoholism (HCC) 06/24/2018  . Aortic stenosis   . Diabetes mellitus type 2, controlled (HCC) 06/24/2018  . Dyspnea   . Essential hypertension 06/24/2018  . GERD (gastroesophageal reflux disease) 06/24/2018  . Heart murmur 06/24/2018  . Hyperlipidemia 06/24/2018  . Thoracic ascending aortic aneurysm [facility])    Past Surgical History:  Procedure Laterality Date  . BENTALL PROCEDURE N/A 09/10/2018   Procedure: BENTALL PROCEDURE USING HEMASHIELD 28, GELWEAVE VALSALVA , AND MAGNA EASE ;  Surgeon: [name], MD;  Location: [facility] OR;  Service: Open Heart Surgery;  Laterality: N/A;  . CHOLECYSTECTOMY    . RIGHT HEART CATH AND CORONARY ANGIOGRAPHY N/A 07/07/2018   Procedure: RIGHT HEART CATH AND CORONARY  ANGIOGRAPHY;  Surgeon: Lennette Bihari, MD;  [facility]: [facility] INVASIVE CV LAB;  Service: Cardiovascular;  Laterality: N/A;  . TEE WITHOUT CARDIOVERSION N/A 09/10/2018   Procedure: TRANSESOPHAGEAL ECHOCARDIOGRAM (TEE);  Surgeon: [name], MD;  Location: [facility] OR;  Service: Open Heart Surgery;  Laterality: N/A;    Family History  Problem Relation Age of Onset  . Lung cancer Mother   . Pulmonary disease Mother   . Alcoholism Father   . Anxiety disorder Father   . Hyperlipidemia Father   . Lung cancer Father   . CAD Father   . Renal cancer Father   . Pulmonary disease Father    Social History   Socioeconomic History  . Marital status: Married    Spouse name: Not on file  . Number of children: Not on file  . Years of education: Not on file  . Highest education level: Not on file  Occupational History  . Not on file  Tobacco Use  . Smoking status: Never Smoker  . Smokeless tobacco: Current User    Types: Snuff  Vaping Use  . Vaping Use: Never used  Substance and Sexual Activity  . Alcohol use: Yes    Alcohol/week: 2.0 standard drinks    Types: 2 Cans of beer per week    Comment: daily  . Drug use: Never  . Sexual  activity: Not on file  Other Topics Concern  . Not on file  Social History Narrative  . Not on file   Social Determinants of Health   Financial Resource Strain: Not on file  Food Insecurity: Not on file  Transportation Needs: Not on file  Physical Activity: Not on file  Stress: Not on file  Social Connections: Not on file    Review of Systems  Constitutional: Negative for activity change and appetite change.     Objective:  BP 122/64 (BP Location: Left Arm, Patient Position: Sitting, Cuff Size: Normal)   Pulse 92   Temp (!) 97.3 F (36.3 C) (Temporal)   Ht 5' 8.5" (1.74 m)   Wt 204 lb (92.5 kg)   SpO2 95%   BMI 30.57 kg/m   BP/Weight 08/31/2020 08/18/2020 03/06/2020  Systolic BP 122 140 142  Diastolic BP 64 70 70  Wt. (Lbs) 204 208 215  BMI  30.57 31.17 32.22    Physical Exam Vitals reviewed.  Constitutional:      Appearance: Normal appearance.  HENT:     Head: Normocephalic.     Right Ear: Tympanic membrane, ear canal and external ear normal.     Left Ear: Tympanic membrane, ear canal and external ear normal.     Nose: Nose normal.     Mouth/Throat:     Mouth: Mucous membranes are moist.     Pharynx: Oropharynx is clear.  Eyes:     Conjunctiva/sclera: Conjunctivae normal.     Pupils: Pupils are equal, round, and reactive to light.  Cardiovascular:     Rate and Rhythm: Normal rate and regular rhythm.     Pulses: Normal pulses.     Heart sounds: Normal heart sounds. No murmur heard. No gallop.   Pulmonary:     Effort: Pulmonary effort is normal. No respiratory distress.     Breath sounds: Normal breath sounds. No rales.  Abdominal:     General: Abdomen is flat. Bowel sounds are normal. There is no distension.     Palpations: Abdomen is soft.     Tenderness: There is no abdominal tenderness.  Musculoskeletal:        General: Normal range of motion.     Cervical back: Normal range of motion.  Skin:    General: Skin is warm.     Capillary Refill: Capillary refill takes less than 2 seconds.  Neurological:     General: No focal deficit present.     Mental Status: He is alert and oriented to person, place, and time. Mental status is at baseline.       Lab Results  Component Value Date   WBC 7.1 02/28/2020   HGB 13.7 02/28/2020   HCT 40.0 02/28/2020   PLT 202 02/28/2020   GLUCOSE 120 (H) 02/28/2020   CHOL 192 02/28/2020   TRIG 226 (H) 02/28/2020   HDL 48 02/28/2020   LDLCALC 105 (H) 02/28/2020   ALT 28 02/28/2020   AST 29 02/28/2020   NA 139 02/28/2020   K 5.2 02/28/2020   CL 100 02/28/2020   CREATININE 0.78 02/28/2020   BUN 8 02/28/2020   CO2 24 02/28/2020   INR 0.8 09/10/2018   HGBA1C 7.4 (H) 02/28/2020      Assessment & Plan:  Diagnoses and all orders for this visit: Secondary adhesive  capsulitis of left shoulder Patient has full recovery of his adhesive capulitis        Follow-up: Return in about 1 month (around 10/01/2020),  or for chronic visit, for fasting.  An After Visit Summary was printed and given to the patient.  Brent Bulla, MD [facility] [phone]

## 2020-09-07 ENCOUNTER — Other Ambulatory Visit: Payer: Self-pay

## 2020-09-07 ENCOUNTER — Ambulatory Visit: Payer: Managed Care, Other (non HMO) | Admitting: Legal Medicine

## 2020-09-07 ENCOUNTER — Encounter: Payer: Self-pay | Admitting: Legal Medicine

## 2020-09-07 VITALS — BP 120/66 | HR 99 | Temp 97.5°F | Ht 68.0 in | Wt 199.0 lb

## 2020-09-07 DIAGNOSIS — E1169 Type 2 diabetes mellitus with other specified complication: Secondary | ICD-10-CM

## 2020-09-07 DIAGNOSIS — E669 Obesity, unspecified: Secondary | ICD-10-CM | POA: Diagnosis not present

## 2020-09-07 DIAGNOSIS — I152 Hypertension secondary to endocrine disorders: Secondary | ICD-10-CM

## 2020-09-07 DIAGNOSIS — E1159 Type 2 diabetes mellitus with other circulatory complications: Secondary | ICD-10-CM | POA: Diagnosis not present

## 2020-09-07 DIAGNOSIS — M7502 Adhesive capsulitis of left shoulder: Secondary | ICD-10-CM | POA: Diagnosis not present

## 2020-09-07 MED ORDER — ACCU-CHEK SOFTCLIX LANCETS MISC: 12 refills | Status: DC

## 2020-09-07 MED ORDER — ONETOUCH VERIO W/DEVICE KIT: 1.0000 | PACK | Freq: Every day | 0 refills | Status: DC

## 2020-09-07 MED ORDER — ACCU-CHEK SOFTCLIX LANCET DEV KIT: 1.0000 | PACK | Freq: Every day | 0 refills | Status: DC

## 2020-09-07 NOTE — Progress Notes (Signed)
Subjective:  Patient ID: Ronnie Ruiz, male    DOB: 04/19/1966  Age: 55 y.o. MRN: [id number]  Chief Complaint  Patient presents with  . Hyperglycemia    HPI  BS increased after steroid injection in shoulder, BS up to 200 and now decreasing.  He is tired . Current Outpatient Medications on File Prior to Visit  Medication Sig Dispense Refill  . aspirin EC 325 MG EC tablet Take 1 tablet (325 mg total) by mouth daily.    Marland Kitchen docusate sodium (COLACE) 250 MG capsule Take 250 mg by mouth daily as needed for constipation.    Marland Kitchen esomeprazole (NEXIUM) 20 MG capsule Take 20 mg by mouth every evening.    Marland Kitchen lisinopril (ZESTRIL) 10 MG tablet TAKE 1/2 TABLET( 5 MG) BY MOUTH DAILY. NEEDS APPOINTMENT FOR MORE REFILLS 15 tablet 0  . metFORMIN (GLUCOPHAGE) 500 MG tablet TAKE 1 TABLET BY MOUTH TWICE DAILY 420 tablet 1  . metoprolol succinate (TOPROL-XL) 50 MG 24 hr tablet TAKE 1 TABLET BY MOUTH ONCE DAILY. 30 tablet 6  . Omega-3 Fatty Acids (FISH OIL) 1000 MG CAPS Take 1,000 mg by mouth 2 (two) times daily.     . simvastatin (ZOCOR) 40 MG tablet TAKE 1 TABLET BY MOUTH EVERY DAY 30 tablet 6   No current facility-administered medications on file prior to visit.   Past Medical History:  Diagnosis Date  . Alcoholism ([place]) 06/24/2018  . Aortic stenosis   . Diabetes mellitus type 2, controlled ([place]) 06/24/2018  . Dyspnea   . Essential hypertension 06/24/2018  . GERD (gastroesophageal reflux disease) 06/24/2018  . Heart murmur 06/24/2018  . Hyperlipidemia 06/24/2018  . Thoracic ascending aortic aneurysm [facility])    Past Surgical History:  Procedure Laterality Date  . BENTALL PROCEDURE N/A 09/10/2018   Procedure: BENTALL PROCEDURE USING HEMASHIELD 28, GELWEAVE VALSALVA 28MM, AND MAGNA EASE 25MM;  Surgeon: Gaye Pollack, MD;  Location: [place];  Service: Open Heart Surgery;  Laterality: N/A;  . CHOLECYSTECTOMY    . RIGHT HEART CATH AND CORONARY ANGIOGRAPHY N/A 07/07/2018   Procedure: RIGHT HEART CATH AND CORONARY  ANGIOGRAPHY;  Surgeon: Troy Sine, MD;  Location: [place] CV LAB;  Service: Cardiovascular;  Laterality: N/A;  . TEE WITHOUT CARDIOVERSION N/A 09/10/2018   Procedure: TRANSESOPHAGEAL ECHOCARDIOGRAM (TEE);  Surgeon: Gaye Pollack, MD;  Location: [place];  Service: Open Heart Surgery;  Laterality: N/A;    Family History  Problem Relation Age of Onset  . Lung cancer Mother   . Pulmonary disease Mother   . Alcoholism Father   . Anxiety disorder Father   . Hyperlipidemia Father   . Lung cancer Father   . CAD Father   . Renal cancer Father   . Pulmonary disease Father    Social History   Socioeconomic History  . Marital status: Married    Spouse name: Not on file  . Number of children: Not on file  . Years of education: Not on file  . Highest education level: Not on file  Occupational History  . Not on file  Tobacco Use  . Smoking status: Never Smoker  . Smokeless tobacco: Current User    Types: Snuff  Vaping Use  . Vaping Use: Never used  Substance and Sexual Activity  . Alcohol use: Yes    Alcohol/week: 2.0 standard drinks    Types: 2 Cans of beer per week    Comment: daily  . Drug use: Never  . Sexual activity: Not  on file  Other Topics Concern  . Not on file  Social History Narrative  . Not on file   Social Determinants of Health   Financial Resource Strain: Not on file  Food Insecurity: Not on file  Transportation Needs: Not on file  Physical Activity: Not on file  Stress: Not on file  Social Connections: Not on file    Review of Systems  Constitutional: Negative for activity change and appetite change.  HENT: Negative for congestion and rhinorrhea.   Eyes: Positive for visual disturbance.  Respiratory: Negative for chest tightness and shortness of breath.   Cardiovascular: Negative for chest pain, palpitations and leg swelling.  Gastrointestinal: Negative for abdominal distention and abdominal pain.  Genitourinary: Negative for difficulty  urinating, dysuria and urgency.  Musculoskeletal: Negative for arthralgias.  Neurological: Negative.   Psychiatric/Behavioral: Negative.      Objective:  BP 120/66 (BP Location: Right Arm, Patient Position: Sitting, Cuff Size: Large)   Pulse 99   Temp (!) 97.5 F (36.4 C) (Temporal)   Ht _0  (1.727 m)   Wt 199 lb (90.3 kg)   SpO2 95%   BMI 30.26 kg/m   BP/Weight 09/07/2020 08/31/2020 8/33/8250  Systolic BP 539 767 341  Diastolic BP 66 64 70  Wt. (Lbs) 199 204 208  BMI 30.26 30.57 31.17    Physical Exam Vitals reviewed.  Constitutional:      Appearance: Normal appearance. He is normal weight.  HENT:     Right Ear: Tympanic membrane normal.     Left Ear: Tympanic membrane normal.  Cardiovascular:     Rate and Rhythm: Normal rate and regular rhythm.     Pulses: Normal pulses.     Heart sounds: Normal heart sounds. No murmur heard. No gallop.   Pulmonary:     Effort: No respiratory distress.     Breath sounds: Normal breath sounds. No rales.  Musculoskeletal:        General: Normal range of motion.  Skin:    General: Skin is warm.  Neurological:     Mental Status: He is alert.     Motor: Weakness present.       Lab Results  Component Value Date   WBC 7.1 02/28/2020   HGB 13.7 02/28/2020   HCT 40.0 02/28/2020   PLT 202 02/28/2020   GLUCOSE 120 (H) 02/28/2020   CHOL 192 02/28/2020   TRIG 226 (H) 02/28/2020   HDL 48 02/28/2020   LDLCALC 105 (H) 02/28/2020   ALT 28 02/28/2020   AST 29 02/28/2020   NA 139 02/28/2020   K 5.2 02/28/2020   CL 100 02/28/2020   CREATININE 0.78 02/28/2020   BUN 8 02/28/2020   CO2 24 02/28/2020   INR 0.8 09/10/2018   HGBA1C 7.4 (H) 02/28/2020      Assessment & Plan:  Diagnoses and all orders for this visit: Obesity, diabetes, and hypertension syndrome (HCC) -     Accu-Chek Softclix Lancets lancets; Use as instructed -     Lancets Misc. (ACCU-CHEK SOFTCLIX LANCET DEV) KIT; 1 each by Does not apply route daily. -      Blood Glucose Monitoring Suppl (ONETOUCH VERIO) w/Device KIT; 1 each by Does not apply route daily. Patient needs new glucose maching Secondary adhesive capsulitis of left shoulder The shoulder pain has improved and he has full ROM       Follow-up: Return as scheduled.  An After Visit Summary was printed and given to the patient.  Reinaldo Meeker,  MD [place] [phone]

## 2020-09-14 ENCOUNTER — Telehealth: Payer: Self-pay | Admitting: Cardiology

## 2020-09-14 DIAGNOSIS — I7121 Aneurysm of the ascending aorta, without rupture: Secondary | ICD-10-CM | POA: Insufficient documentation

## 2020-09-14 DIAGNOSIS — R0600 Dyspnea, unspecified: Secondary | ICD-10-CM | POA: Insufficient documentation

## 2020-09-14 DIAGNOSIS — I350 Nonrheumatic aortic (valve) stenosis: Secondary | ICD-10-CM | POA: Insufficient documentation

## 2020-09-14 DIAGNOSIS — I712 Thoracic aortic aneurysm, without rupture: Secondary | ICD-10-CM | POA: Insufficient documentation

## 2020-09-14 NOTE — Progress Notes (Signed)
Send last blood work and note to [employer]

## 2020-09-14 NOTE — Telephone Encounter (Signed)
Patient's wife states 1 month ago the patient hurt his shoulder and had to take a coritsone shot. 1 week ago he began having issues with his sugar levels and a few days ago he became extremely weak and SOB with any exertion. He also has exertional chest pain.  Pt c/o Shortness Of Breath: STAT if SOB developed within the last 24 hours or pt is noticeably SOB on the phone  1. Are you currently SOB (can you hear that pt is SOB on the phone)?  Patient's wife is not currently with the patient  2. How long have you been experiencing SOB? Past few days, per patient's wife   3. Are you SOB when sitting or when up moving around? When up and moving around  4. Are you currently experiencing any other symptoms? Not currently   Pt c/o of Chest Pain: STAT if CP now or developed within 24 hours  1. Are you having CP right now? No   2. Are you experiencing any other symptoms (ex. SOB, nausea, vomiting, sweating)? Weakness, SOB  3. How long have you been experiencing CP? Past few days, per patient's wife  4. Is your CP continuous or coming and going? Coming and going with exertion  5. Have you taken Nitroglycerin? No. Patient's wife states he has nitro, but he hasn't taken any ?

## 2020-09-14 NOTE — Telephone Encounter (Signed)
Pt's wife states that for 3 weeks the pt has had increased fatigue, night sweats, loss of apetite and generally not feeling well. Wife states his sx are as before he had his heart sx. Appointment made with Dr. Tomie China 09/15/20.

## 2020-09-14 NOTE — Telephone Encounter (Signed)
[facility] called. They received a letter from our office requesting the last OV notes for this patient.  [facility] is also on Epic, so they wanted to follow up to see if records still need to be sent.  Please follow up

## 2020-09-15 ENCOUNTER — Encounter: Payer: Self-pay | Admitting: Cardiology

## 2020-09-15 ENCOUNTER — Ambulatory Visit: Payer: Managed Care, Other (non HMO) | Admitting: Cardiology

## 2020-09-15 ENCOUNTER — Other Ambulatory Visit: Payer: Self-pay

## 2020-09-15 VITALS — BP 116/60 | HR 116 | Ht 68.6 in | Wt 193.8 lb

## 2020-09-15 DIAGNOSIS — Z952 Presence of prosthetic heart valve: Secondary | ICD-10-CM

## 2020-09-15 DIAGNOSIS — I10 Essential (primary) hypertension: Secondary | ICD-10-CM | POA: Diagnosis not present

## 2020-09-15 DIAGNOSIS — E1121 Type 2 diabetes mellitus with diabetic nephropathy: Secondary | ICD-10-CM | POA: Diagnosis not present

## 2020-09-15 DIAGNOSIS — R61 Generalized hyperhidrosis: Secondary | ICD-10-CM | POA: Insufficient documentation

## 2020-09-15 DIAGNOSIS — I350 Nonrheumatic aortic (valve) stenosis: Secondary | ICD-10-CM

## 2020-09-15 DIAGNOSIS — R5383 Other fatigue: Secondary | ICD-10-CM | POA: Insufficient documentation

## 2020-09-15 HISTORY — DX: Generalized hyperhidrosis: R61

## 2020-09-15 HISTORY — DX: Other fatigue: R53.83

## 2020-09-15 NOTE — Progress Notes (Signed)
I called [facility] to talk with the nurse. I explained that we are in Epic. She will call me back to see if they still need the records.

## 2020-09-15 NOTE — Progress Notes (Signed)
Cardiology Office Note:    Date:  09/15/2020   ID:  Ronnie Ruiz, DOB 08-03-1965, MRN [id number]  PCP:  Lillard Anes, MD  Cardiologist:  Jenean Lindau, MD   Referring MD: Lillard Anes,*    ASSESSMENT:    1. Essential hypertension   2. Controlled type 2 diabetes mellitus with diabetic nephropathy, without long-term current use of insulin ([place])   3. S/P AVR   4. Night sweats   5. Fatigue, unspecified type    PLAN:    In order of problems listed above:  1. Secondary prevention stressed with the patient.  Importance of compliance with diet medication stressed and he vocalized understanding. 2. Prostatic valve replacement: Clinically appears stable at this time but we will do an echocardiogram to understand his symptoms 3. Fatigue and night sweats: Patient is a diabetic and has history of alcohol use which she quit about a week ago.  I am concerned about the symptoms and I called his physician Dr. Tobie Poet and explained to her the situation at length.  We will do complete blood work including ESR and blood cultures especially since he is a diabetic and has a prosthetic valve.  Dr. Tobie Poet agreed to follow this lab work.  Also the alcohol discontinuation will need to be addressed and she will monitor it.  She is covering for Dr. Henrene Pastor. 4. Diabetes mellitus: Diet was emphasized and he vocalized understanding. 5. Patient will be seen in follow-up appointment in 1 to 2 weeks by Dr. Agustin Cree or earlier if he has any concerns.  He knows to go to the nearest emergency room for any concerning symptoms. 6. Because of the aforementioned short period of time I switched beta-blocker and increase it to 75 mg and stop ACE inhibitor.  Since the patient is a diabetic this will have to be reinstated when things are stable.  Also he may need a weight loss evaluation.  I discussed this with him.   Medication Adjustments/Labs and Tests Ordered: Current medicines are reviewed at length with  the patient today.  Concerns regarding medicines are outlined above.  No orders of the defined types were placed in this encounter.  No orders of the defined types were placed in this encounter.    No chief complaint on file.    History of Present Illness:    Ronnie Ruiz is a 55 y.o. male.  Patient sees my partner and history of medical replacement, essential hypertension dyslipidemia and diabetes mellitus.  Patient saw Dr. Henrene Pastor and complained of palpitations and losing weight and fatigue.  Therefore he was sent here.  He has nonspecific symptoms.  He tells me that he quit using alcohol about a week ago and subsequently this got worse.  No chest pain orthopnea or PND.  At the time of my evaluation, the patient is alert awake oriented and in no distress.  Past Medical History:  Diagnosis Date  . Alcohol dependency ([place]) 06/24/2018  . Alcoholism ([place]) 06/24/2018  . Aortic stenosis   . Ascending aortic aneurysm (HCC) 41 mm as measured by echo 07/02/2018  . Atherosclerotic heart disease 06/24/2018  . Bicuspid aortic valve 06/25/2018  . Critical aortic valve stenosis mean gradient 64 mmHg, 06/25/2018  . Diabetes mellitus type 2, controlled ([place]) 06/24/2018  . Dyspnea   . Essential hypertension 06/24/2018  . GERD (gastroesophageal reflux disease) 06/24/2018  . Heart murmur 06/24/2018  . Hyperlipidemia 06/24/2018  . Obesity, diabetes, and hypertension syndrome ([place]) 02/28/2020  . S/P  AVR 09/22/2018   Biological [place] Procedure using a 25 mm Edwards Magna-Ease pericardial valve and a 28 mm Gelweave Valsalva graft.  . Secondary adhesive capsulitis of left shoulder 08/31/2020  . Thoracic ascending aortic aneurysm [facility])     Past Surgical History:  Procedure Laterality Date  . BENTALL PROCEDURE N/A 09/10/2018   Procedure: BENTALL PROCEDURE USING HEMASHIELD 28, GELWEAVE VALSALVA 28MM, AND MAGNA EASE 25MM;  Surgeon: Gaye Pollack, MD;  Location: [place];  Service: Open Heart Surgery;  Laterality: N/A;  .  CHOLECYSTECTOMY    . RIGHT HEART CATH AND CORONARY ANGIOGRAPHY N/A 07/07/2018   Procedure: RIGHT HEART CATH AND CORONARY ANGIOGRAPHY;  Surgeon: Troy Sine, MD;  Location: [place] CV LAB;  Service: Cardiovascular;  Laterality: N/A;  . TEE WITHOUT CARDIOVERSION N/A 09/10/2018   Procedure: TRANSESOPHAGEAL ECHOCARDIOGRAM (TEE);  Surgeon: Gaye Pollack, MD;  Location: [place];  Service: Open Heart Surgery;  Laterality: N/A;    Current Medications: Current Meds  Medication Sig  . aspirin EC 325 MG EC tablet Take 1 tablet (325 mg total) by mouth daily.  Marland Kitchen docusate sodium (COLACE) 250 MG capsule Take 250 mg by mouth daily as needed for constipation.  Marland Kitchen esomeprazole (NEXIUM) 20 MG capsule Take 20 mg by mouth every evening.  Marland Kitchen lisinopril (ZESTRIL) 10 MG tablet Take 5 mg by mouth daily.  . metFORMIN (GLUCOPHAGE) 500 MG tablet Take 500 mg by mouth 2 (two) times daily with a meal.  . metoprolol succinate (TOPROL-XL) 50 MG 24 hr tablet Take 50 mg by mouth daily.  . Omega-3 Fatty Acids (FISH OIL) 1000 MG CAPS Take 1,000 mg by mouth 2 (two) times daily.   . simvastatin (ZOCOR) 40 MG tablet Take 40 mg by mouth daily.     Allergies:   Patient has no known allergies.   Social History   Socioeconomic History  . Marital status: Married    Spouse name: Not on file  . Number of children: Not on file  . Years of education: Not on file  . Highest education level: Not on file  Occupational History  . Not on file  Tobacco Use  . Smoking status: Never Smoker  . Smokeless tobacco: Current User    Types: Snuff  Vaping Use  . Vaping Use: Never used  Substance and Sexual Activity  . Alcohol use: Yes    Alcohol/week: 2.0 standard drinks    Types: 2 Cans of beer per week    Comment: daily  . Drug use: Never  . Sexual activity: Not on file  Other Topics Concern  . Not on file  Social History Narrative  . Not on file   Social Determinants of Health   Financial Resource Strain: Not on file   Food Insecurity: Not on file  Transportation Needs: Not on file  Physical Activity: Not on file  Stress: Not on file  Social Connections: Not on file     Family History: The patient's family history includes Alcoholism in his father; Anxiety disorder in his father; CAD in his father; Hyperlipidemia in his father; Lung cancer in his father and mother; Pulmonary disease in his father and mother; Renal cancer in his father.  ROS:   Please see the history of present illness.    All other systems reviewed and are negative.  EKGs/Labs/Other Studies Reviewed:    The following studies were reviewed today: EKG reveals sinus tachycardia and nonspecific ST-T changes   Recent Labs: 02/28/2020: ALT 28; BUN 8;  Creatinine, Ser 0.78; Hemoglobin 13.7; Platelets 202; Potassium 5.2; Sodium 139  Recent Lipid Panel    Component Value Date/Time   CHOL 192 02/28/2020 0821   TRIG 226 (H) 02/28/2020 0821   HDL 48 02/28/2020 0821   CHOLHDL 4.0 02/28/2020 0821   LDLCALC 105 (H) 02/28/2020 0821    Physical Exam:    VS:  BP 116/60   Pulse (!) 124   Ht 5' 8.6" (1.742 m)   Wt 193 lb 12.8 oz (87.9 kg)   BMI 28.95 kg/m     Wt Readings from Last 3 Encounters:  09/15/20 193 lb 12.8 oz (87.9 kg)  09/07/20 199 lb (90.3 kg)  08/31/20 204 lb (92.5 kg)     GEN: Patient is in no acute distress HEENT: Normal NECK: No JVD; No carotid bruits LYMPHATICS: No lymphadenopathy CARDIAC: Hear sounds regular, 2/6 systolic murmur at the apex. RESPIRATORY:  Clear to auscultation without rales, wheezing or rhonchi  ABDOMEN: Soft, non-tender, non-distended MUSCULOSKELETAL:  No edema; No deformity  SKIN: Warm and dry NEUROLOGIC:  Alert and oriented x 3 PSYCHIATRIC:  Normal affect   Signed, Jenean Lindau, MD  09/15/2020 1:16 PM    [facility]

## 2020-09-15 NOTE — Progress Notes (Signed)
I called [facility] and I explained to the nurse that we are on Epic. I asked to reach me out if they still need the records.

## 2020-09-15 NOTE — Patient Instructions (Signed)
Medication Instructions:  No medication changes. *If you need a refill on your cardiac medications before your next appointment, please call your pharmacy*   Lab Work: None ordered If you have labs (blood work) drawn today and your tests are completely normal, you will receive your results only by: Marland Kitchen MyChart Message (if you have MyChart) OR . A paper copy in the mail If you have any lab test that is abnormal or we need to change your treatment, we will call you to review the results.   Testing/Procedures: Your physician has requested that you have an echocardiogram. Echocardiography is a painless test that uses sound waves to create images of your heart. It provides your doctor with information about the size and shape of your heart and how well your heart's chambers and valves are working. This procedure takes approximately one hour. There are no restrictions for this procedure.    Follow-Up: At [facility], you and your health needs are our priority.  As part of our continuing mission to provide you with exceptional heart care, we have created designated Provider Care Teams.  These Care Teams include your primary Cardiologist (physician) and Advanced Practice Providers (APPs -  Physician Assistants and Nurse Practitioners) who all work together to provide you with the care you need, when you need it.  We recommend signing up for the patient portal called "MyChart".  Sign up information is provided on this After Visit Summary.  MyChart is used to connect with patients for Virtual Visits (Telemedicine).  Patients are able to view lab/test results, encounter notes, upcoming appointments, etc.  Non-urgent messages can be sent to your provider as well.   To learn more about what you can do with MyChart, go to [email or web address].    Your next appointment:   8-10  day(s)  The format for your next appointment:   In Person  Provider:   Gypsy Balsam, MD   Other  Instructions Echocardiogram An echocardiogram is a test that uses sound waves (ultrasound) to produce images of the heart. Images from an echocardiogram can provide important information about:  Heart size and shape.  The size and thickness and movement of your heart's walls.  Heart muscle function and strength.  Heart valve function or if you have stenosis. Stenosis is when the heart valves are too narrow.  If blood is flowing backward through the heart valves (regurgitation).  A tumor or infectious growth around the heart valves.  Areas of heart muscle that are not working well because of poor blood flow or injury from a heart attack.  Aneurysm detection. An aneurysm is a weak or damaged part of an artery wall. The wall bulges out from the normal force of blood pumping through the body. Tell a health care provider about:  Any allergies you have.  All medicines you are taking, including vitamins, herbs, eye drops, creams, and over-the-counter medicines.  Any blood disorders you have.  Any surgeries you have had.  Any medical conditions you have.  Whether you are pregnant or may be pregnant. What are the risks? Generally, this is a safe test. However, problems may occur, including an allergic reaction to dye (contrast) that may be used during the test. What happens before the test? No specific preparation is needed. You may eat and drink normally. What happens during the test?  You will take off your clothes from the waist up and put on a hospital gown.  Electrodes or electrocardiogram (ECG)patches may be placed on your chest.  The electrodes or patches are then connected to a device that monitors your heart rate and rhythm.  You will lie down on a table for an ultrasound exam. A gel will be applied to your chest to help sound waves pass through your skin.  A handheld device, called a transducer, will be pressed against your chest and moved over your heart. The transducer  produces sound waves that travel to your heart and bounce back (or "echo" back) to the transducer. These sound waves will be captured in real-time and changed into images of your heart that can be viewed on a video monitor. The images will be recorded on a computer and reviewed by your health care provider.  You may be asked to change positions or hold your breath for a short time. This makes it easier to get different views or better views of your heart.  In some cases, you may receive contrast through an IV in one of your veins. This can improve the quality of the pictures from your heart. The procedure may vary among health care providers and hospitals.   What can I expect after the test? You may return to your normal, everyday life, including diet, activities, and medicines, unless your health care provider tells you not to do that. Follow these instructions at home:  It is up to you to get the results of your test. Ask your health care provider, or the department that is doing the test, when your results will be ready.  Keep all follow-up visits. This is important. Summary  An echocardiogram is a test that uses sound waves (ultrasound) to produce images of the heart.  Images from an echocardiogram can provide important information about the size and shape of your heart, heart muscle function, heart valve function, and other possible heart problems.  You do not need to do anything to prepare before this test. You may eat and drink normally.  After the echocardiogram is completed, you may return to your normal, everyday life, unless your health care provider tells you not to do that. This information is not intended to replace advice given to you by your health care provider. Make sure you discuss any questions you have with your health care provider. Document Revised: 11/30/2019 Document Reviewed: 11/30/2019 Elsevier Patient Education  2021 [employer].

## 2020-09-15 NOTE — Addendum Note (Signed)
Addended by: Mayer Camel L on: 09/15/2020 04:39 PM   Modules accepted: Orders

## 2020-09-15 NOTE — Telephone Encounter (Signed)
Dr. Tomie China called me to discuss this patient's symptoms. Dr. Tomie China sent him for labs. Preliminary labs were normal except sodium low at 128. He told Dr. Tomie China he had quit drinking 1 week ago, but told Dr. Marina Goodell that he quit one month ago. Will schedule pt to follow up next week with Dr. Marina Goodell.

## 2020-09-17 NOTE — Telephone Encounter (Signed)
I talked with patient, his BS are running high and he is having nausea  and weakness, no alcohol in months acoording to patient, I requested he go to ER with uncontrolled DM and possible DKA lp

## 2020-09-21 DIAGNOSIS — A419 Sepsis, unspecified organism: Secondary | ICD-10-CM

## 2020-09-21 DIAGNOSIS — E1169 Type 2 diabetes mellitus with other specified complication: Secondary | ICD-10-CM

## 2020-09-21 DIAGNOSIS — I361 Nonrheumatic tricuspid (valve) insufficiency: Secondary | ICD-10-CM

## 2020-09-21 DIAGNOSIS — I340 Nonrheumatic mitral (valve) insufficiency: Secondary | ICD-10-CM

## 2020-09-21 DIAGNOSIS — I959 Hypotension, unspecified: Secondary | ICD-10-CM

## 2020-09-21 DIAGNOSIS — Z953 Presence of xenogenic heart valve: Secondary | ICD-10-CM

## 2020-09-22 DIAGNOSIS — I959 Hypotension, unspecified: Secondary | ICD-10-CM | POA: Diagnosis not present

## 2020-09-22 DIAGNOSIS — E1169 Type 2 diabetes mellitus with other specified complication: Secondary | ICD-10-CM | POA: Diagnosis not present

## 2020-09-22 DIAGNOSIS — Z953 Presence of xenogenic heart valve: Secondary | ICD-10-CM | POA: Diagnosis not present

## 2020-09-22 DIAGNOSIS — A419 Sepsis, unspecified organism: Secondary | ICD-10-CM | POA: Diagnosis not present

## 2020-09-23 ENCOUNTER — Inpatient Hospital Stay
Admission: AD | Admit: 2020-09-23 | Payer: Managed Care, Other (non HMO) | Source: Other Acute Inpatient Hospital | Admitting: Cardiovascular Disease

## 2020-09-23 DIAGNOSIS — A419 Sepsis, unspecified organism: Secondary | ICD-10-CM | POA: Diagnosis not present

## 2020-09-23 DIAGNOSIS — E1169 Type 2 diabetes mellitus with other specified complication: Secondary | ICD-10-CM | POA: Diagnosis not present

## 2020-09-23 DIAGNOSIS — I959 Hypotension, unspecified: Secondary | ICD-10-CM | POA: Diagnosis not present

## 2020-09-23 DIAGNOSIS — Z953 Presence of xenogenic heart valve: Secondary | ICD-10-CM | POA: Diagnosis not present

## 2020-09-24 DIAGNOSIS — A419 Sepsis, unspecified organism: Secondary | ICD-10-CM | POA: Diagnosis not present

## 2020-09-24 DIAGNOSIS — I959 Hypotension, unspecified: Secondary | ICD-10-CM | POA: Diagnosis not present

## 2020-09-24 DIAGNOSIS — E1169 Type 2 diabetes mellitus with other specified complication: Secondary | ICD-10-CM | POA: Diagnosis not present

## 2020-09-24 DIAGNOSIS — Z953 Presence of xenogenic heart valve: Secondary | ICD-10-CM | POA: Diagnosis not present

## 2020-09-25 DIAGNOSIS — Z953 Presence of xenogenic heart valve: Secondary | ICD-10-CM | POA: Diagnosis not present

## 2020-09-25 DIAGNOSIS — A419 Sepsis, unspecified organism: Secondary | ICD-10-CM | POA: Diagnosis not present

## 2020-09-25 DIAGNOSIS — I959 Hypotension, unspecified: Secondary | ICD-10-CM | POA: Diagnosis not present

## 2020-09-25 DIAGNOSIS — E1169 Type 2 diabetes mellitus with other specified complication: Secondary | ICD-10-CM | POA: Diagnosis not present

## 2020-09-26 ENCOUNTER — Inpatient Hospital Stay (HOSPITAL_COMMUNITY)
Admission: AD | Admit: 2020-09-26 | Discharge: 2020-10-10 | DRG: 219 | Disposition: A | Discharge status: Home Health | Payer: Managed Care, Other (non HMO) | Source: Other Acute Inpatient Hospital | Attending: Surgery | Admitting: Surgery

## 2020-09-26 DIAGNOSIS — I38 Endocarditis, valve unspecified: Secondary | ICD-10-CM

## 2020-09-26 DIAGNOSIS — Q2381 Bicuspid aortic valve: Secondary | ICD-10-CM

## 2020-09-26 DIAGNOSIS — D62 Acute posthemorrhagic anemia: Secondary | ICD-10-CM | POA: Diagnosis not present

## 2020-09-26 DIAGNOSIS — F1020 Alcohol dependence, uncomplicated: Secondary | ICD-10-CM | POA: Diagnosis present

## 2020-09-26 DIAGNOSIS — I959 Hypotension, unspecified: Secondary | ICD-10-CM | POA: Diagnosis not present

## 2020-09-26 DIAGNOSIS — M7502 Adhesive capsulitis of left shoulder: Secondary | ICD-10-CM | POA: Diagnosis present

## 2020-09-26 DIAGNOSIS — E860 Dehydration: Secondary | ICD-10-CM | POA: Diagnosis present

## 2020-09-26 DIAGNOSIS — K219 Gastro-esophageal reflux disease without esophagitis: Secondary | ICD-10-CM | POA: Diagnosis present

## 2020-09-26 DIAGNOSIS — Z811 Family history of alcohol abuse and dependence: Secondary | ICD-10-CM

## 2020-09-26 DIAGNOSIS — I712 Thoracic aortic aneurysm, without rupture: Secondary | ICD-10-CM | POA: Diagnosis present

## 2020-09-26 DIAGNOSIS — G8929 Other chronic pain: Secondary | ICD-10-CM | POA: Diagnosis present

## 2020-09-26 DIAGNOSIS — Z20822 Contact with and (suspected) exposure to covid-19: Secondary | ICD-10-CM | POA: Diagnosis present

## 2020-09-26 DIAGNOSIS — E7800 Pure hypercholesterolemia, unspecified: Secondary | ICD-10-CM | POA: Diagnosis not present

## 2020-09-26 DIAGNOSIS — Q231 Congenital insufficiency of aortic valve: Secondary | ICD-10-CM | POA: Diagnosis not present

## 2020-09-26 DIAGNOSIS — Z7982 Long term (current) use of aspirin: Secondary | ICD-10-CM

## 2020-09-26 DIAGNOSIS — Z8051 Family history of malignant neoplasm of kidney: Secondary | ICD-10-CM

## 2020-09-26 DIAGNOSIS — F10229 Alcohol dependence with intoxication, unspecified: Secondary | ICD-10-CM | POA: Diagnosis not present

## 2020-09-26 DIAGNOSIS — N179 Acute kidney failure, unspecified: Secondary | ICD-10-CM | POA: Diagnosis present

## 2020-09-26 DIAGNOSIS — I350 Nonrheumatic aortic (valve) stenosis: Secondary | ICD-10-CM | POA: Diagnosis present

## 2020-09-26 DIAGNOSIS — K5900 Constipation, unspecified: Secondary | ICD-10-CM | POA: Diagnosis not present

## 2020-09-26 DIAGNOSIS — E1165 Type 2 diabetes mellitus with hyperglycemia: Secondary | ICD-10-CM | POA: Diagnosis present

## 2020-09-26 DIAGNOSIS — Z7984 Long term (current) use of oral hypoglycemic drugs: Secondary | ICD-10-CM | POA: Diagnosis not present

## 2020-09-26 DIAGNOSIS — I2510 Atherosclerotic heart disease of native coronary artery without angina pectoris: Secondary | ICD-10-CM | POA: Diagnosis present

## 2020-09-26 DIAGNOSIS — E8779 Other fluid overload: Secondary | ICD-10-CM | POA: Diagnosis not present

## 2020-09-26 DIAGNOSIS — E785 Hyperlipidemia, unspecified: Secondary | ICD-10-CM | POA: Diagnosis present

## 2020-09-26 DIAGNOSIS — T826XXA Infection and inflammatory reaction due to cardiac valve prosthesis, initial encounter: Principal | ICD-10-CM | POA: Diagnosis present

## 2020-09-26 DIAGNOSIS — I330 Acute and subacute infective endocarditis: Secondary | ICD-10-CM | POA: Diagnosis present

## 2020-09-26 DIAGNOSIS — Z952 Presence of prosthetic heart valve: Secondary | ICD-10-CM | POA: Diagnosis not present

## 2020-09-26 DIAGNOSIS — A4189 Other specified sepsis: Secondary | ICD-10-CM | POA: Diagnosis present

## 2020-09-26 DIAGNOSIS — R6520 Severe sepsis without septic shock: Secondary | ICD-10-CM | POA: Diagnosis present

## 2020-09-26 DIAGNOSIS — Z818 Family history of other mental and behavioral disorders: Secondary | ICD-10-CM

## 2020-09-26 DIAGNOSIS — R011 Cardiac murmur, unspecified: Secondary | ICD-10-CM | POA: Diagnosis present

## 2020-09-26 DIAGNOSIS — K029 Dental caries, unspecified: Secondary | ICD-10-CM

## 2020-09-26 DIAGNOSIS — E871 Hypo-osmolality and hyponatremia: Secondary | ICD-10-CM | POA: Diagnosis present

## 2020-09-26 DIAGNOSIS — I5189 Other ill-defined heart diseases: Secondary | ICD-10-CM | POA: Diagnosis present

## 2020-09-26 DIAGNOSIS — M1990 Unspecified osteoarthritis, unspecified site: Secondary | ICD-10-CM | POA: Diagnosis present

## 2020-09-26 DIAGNOSIS — Z801 Family history of malignant neoplasm of trachea, bronchus and lung: Secondary | ICD-10-CM

## 2020-09-26 DIAGNOSIS — Z6828 Body mass index (BMI) 28.0-28.9, adult: Secondary | ICD-10-CM

## 2020-09-26 DIAGNOSIS — I10 Essential (primary) hypertension: Secondary | ICD-10-CM | POA: Diagnosis present

## 2020-09-26 DIAGNOSIS — I493 Ventricular premature depolarization: Secondary | ICD-10-CM | POA: Diagnosis not present

## 2020-09-26 DIAGNOSIS — R61 Generalized hyperhidrosis: Secondary | ICD-10-CM | POA: Diagnosis present

## 2020-09-26 DIAGNOSIS — R7881 Bacteremia: Secondary | ICD-10-CM

## 2020-09-26 DIAGNOSIS — I471 Supraventricular tachycardia: Secondary | ICD-10-CM | POA: Diagnosis present

## 2020-09-26 DIAGNOSIS — B3321 Viral endocarditis: Secondary | ICD-10-CM | POA: Diagnosis not present

## 2020-09-26 DIAGNOSIS — E669 Obesity, unspecified: Secondary | ICD-10-CM | POA: Diagnosis present

## 2020-09-26 DIAGNOSIS — Z953 Presence of xenogenic heart valve: Secondary | ICD-10-CM | POA: Diagnosis not present

## 2020-09-26 DIAGNOSIS — E782 Mixed hyperlipidemia: Secondary | ICD-10-CM | POA: Diagnosis not present

## 2020-09-26 DIAGNOSIS — Y831 Surgical operation with implant of artificial internal device as the cause of abnormal reaction of the patient, or of later complication, without mention of misadventure at the time of the procedure: Secondary | ICD-10-CM | POA: Diagnosis present

## 2020-09-26 DIAGNOSIS — Z01810 Encounter for preprocedural cardiovascular examination: Secondary | ICD-10-CM | POA: Diagnosis not present

## 2020-09-26 DIAGNOSIS — E1169 Type 2 diabetes mellitus with other specified complication: Secondary | ICD-10-CM | POA: Diagnosis not present

## 2020-09-26 DIAGNOSIS — Q211 Atrial septal defect: Secondary | ICD-10-CM | POA: Diagnosis not present

## 2020-09-26 DIAGNOSIS — E119 Type 2 diabetes mellitus without complications: Secondary | ICD-10-CM

## 2020-09-26 DIAGNOSIS — Z6372 Alcoholism and drug addiction in family: Secondary | ICD-10-CM

## 2020-09-26 DIAGNOSIS — E43 Unspecified severe protein-calorie malnutrition: Secondary | ICD-10-CM | POA: Diagnosis present

## 2020-09-26 DIAGNOSIS — R5383 Other fatigue: Secondary | ICD-10-CM | POA: Diagnosis not present

## 2020-09-26 DIAGNOSIS — Z79899 Other long term (current) drug therapy: Secondary | ICD-10-CM

## 2020-09-26 DIAGNOSIS — Z8249 Family history of ischemic heart disease and other diseases of the circulatory system: Secondary | ICD-10-CM

## 2020-09-26 DIAGNOSIS — A419 Sepsis, unspecified organism: Secondary | ICD-10-CM | POA: Diagnosis not present

## 2020-09-26 DIAGNOSIS — I339 Acute and subacute endocarditis, unspecified: Secondary | ICD-10-CM | POA: Diagnosis present

## 2020-09-26 HISTORY — DX: Endocarditis, valve unspecified: I38

## 2020-09-26 HISTORY — DX: Bacteremia: R78.81

## 2020-09-26 LAB — COMPREHENSIVE METABOLIC PANEL
ALT: 25 U/L (ref 0–44)
AST: 24 U/L (ref 15–41)
Albumin: 2.5 g/dL — ABNORMAL LOW (ref 3.5–5.0)
Alkaline Phosphatase: 60 U/L (ref 38–126)
Anion gap: 8 (ref 5–15)
BUN: 11 mg/dL (ref 6–20)
CO2: 26 mmol/L (ref 22–32)
Calcium: 8.9 mg/dL (ref 8.9–10.3)
Chloride: 98 mmol/L (ref 98–111)
Creatinine, Ser: 0.78 mg/dL (ref 0.61–1.24)
GFR, Estimated: >60 mL/min (ref >60)
Glucose, Bld: 222 mg/dL — ABNORMAL HIGH (ref 70–99)
Potassium: 4.1 mmol/L (ref 3.5–5.1)
Sodium: 132 mmol/L — ABNORMAL LOW (ref 135–145)
Total Bilirubin: 0.4 mg/dL (ref 0.3–1.2)
Total Protein: 6.7 g/dL (ref 6.5–8.1)

## 2020-09-26 LAB — CBC WITH DIFFERENTIAL/PLATELET
Abs Immature Granulocytes: 0.07 10*3/uL (ref 0.00–0.07)
Basophils Absolute: 0.1 10*3/uL (ref 0.0–0.1)
Basophils Relative: 1 %
Eosinophils Absolute: 0.2 10*3/uL (ref 0.0–0.5)
Eosinophils Relative: 2 %
HCT: 30.1 % — ABNORMAL LOW (ref 39.0–52.0)
Hemoglobin: 9.6 g/dL — ABNORMAL LOW (ref 13.0–17.0)
Immature Granulocytes: 1 %
Lymphocytes Relative: 20 %
Lymphs Abs: 1.7 10*3/uL (ref 0.7–4.0)
MCH: 29.6 pg (ref 26.0–34.0)
MCHC: 31.9 g/dL (ref 30.0–36.0)
MCV: 92.9 fL (ref 80.0–100.0)
Monocytes Absolute: 0.7 10*3/uL (ref 0.1–1.0)
Monocytes Relative: 8 %
Neutro Abs: 5.6 10*3/uL (ref 1.7–7.7)
Neutrophils Relative %: 68 %
Platelets: 452 10*3/uL — ABNORMAL HIGH (ref 150–400)
RBC: 3.24 MIL/uL — ABNORMAL LOW (ref 4.22–5.81)
RDW: 13.9 % (ref 11.5–15.5)
WBC: 8.2 10*3/uL (ref 4.0–10.5)
nRBC: 0 % (ref 0.0–0.2)

## 2020-09-26 LAB — APTT: aPTT: 37 seconds — ABNORMAL HIGH (ref 24–36)

## 2020-09-26 LAB — MAGNESIUM: Magnesium: 1.9 mg/dL (ref 1.7–2.4)

## 2020-09-26 LAB — PROTIME-INR
INR: 1 (ref 0.8–1.2)
Prothrombin Time: 13.6 seconds (ref 11.4–15.2)

## 2020-09-26 LAB — C-REACTIVE PROTEIN: CRP: 12.7 mg/dL — ABNORMAL HIGH (ref <1.0)

## 2020-09-26 LAB — GLUCOSE, CAPILLARY: Glucose-Capillary: 189 mg/dL — ABNORMAL HIGH (ref 70–99)

## 2020-09-26 MED ORDER — HEPARIN SODIUM (PORCINE) 5000 UNIT/ML IJ SOLN
5000.0000 [IU] | Freq: Three times a day (TID) | INTRAMUSCULAR | Status: DC
  Administered 2020-09-26 – 2020-10-03 (×21): 5000 [IU] via SUBCUTANEOUS
  Filled 2020-09-26 (×21): qty 1

## 2020-09-26 MED ORDER — PANTOPRAZOLE SODIUM 40 MG PO TBEC
40.0000 mg | DELAYED_RELEASE_TABLET | Freq: Every day | ORAL | Status: DC
  Administered 2020-09-27 – 2020-10-03 (×7): 40 mg via ORAL
  Filled 2020-09-26 (×7): qty 1

## 2020-09-26 MED ORDER — ZOLPIDEM TARTRATE 5 MG PO TABS: 5.0000 mg | ORAL_TABLET | Freq: Every evening | ORAL | Status: DC | PRN

## 2020-09-26 MED ORDER — SIMVASTATIN 20 MG PO TABS: 20.0000 mg | ORAL_TABLET | Freq: Every day | ORAL | Status: DC

## 2020-09-26 MED ORDER — NITROGLYCERIN 0.4 MG SL SUBL: 0.4000 mg | SUBLINGUAL_TABLET | SUBLINGUAL | Status: DC | PRN

## 2020-09-26 MED ORDER — ASPIRIN EC 81 MG PO TBEC
81.0000 mg | DELAYED_RELEASE_TABLET | Freq: Every day | ORAL | Status: DC
  Administered 2020-09-27 – 2020-10-03 (×7): 81 mg via ORAL
  Filled 2020-09-26 (×7): qty 1

## 2020-09-26 MED ORDER — SIMVASTATIN 20 MG PO TABS
40.0000 mg | ORAL_TABLET | Freq: Every day | ORAL | Status: DC
  Administered 2020-09-27 – 2020-09-29 (×3): 40 mg via ORAL
  Filled 2020-09-26 (×3): qty 2

## 2020-09-26 MED ORDER — ONDANSETRON HCL 4 MG/2ML IJ SOLN: 4.0000 mg | Freq: Four times a day (QID) | INTRAMUSCULAR | Status: DC | PRN

## 2020-09-26 MED ORDER — ACETAMINOPHEN 325 MG PO TABS
650.0000 mg | ORAL_TABLET | ORAL | Status: DC | PRN
  Administered 2020-09-28: 650 mg via ORAL
  Filled 2020-09-26: qty 2

## 2020-09-26 MED ORDER — SODIUM CHLORIDE 0.9 % IV SOLN
2.0000 g | INTRAVENOUS | Status: DC
  Administered 2020-09-27 – 2020-10-03 (×7): 2 g via INTRAVENOUS
  Filled 2020-09-26 (×3): qty 2
  Filled 2020-09-26 (×2): qty 20
  Filled 2020-09-26 (×2): qty 2
  Filled 2020-09-26: qty 20
  Filled 2020-09-26: qty 2

## 2020-09-26 MED ORDER — CEFTRIAXONE SODIUM 2 G IJ SOLR: 2.0000 g | INTRAMUSCULAR | Status: DC

## 2020-09-26 NOTE — H&P (Signed)
Cardiology Admission History and Physical:   Patient ID: Ronnie Ruiz MRN: [id number]; DOB: [birth year]   Admission date: 09/26/2020  PCP:  Lillard Anes, MD   [facility] Providers Cardiologist: Dr Sallyanne Kuster and Dr. Geraldo Pitter     Chief Complaint:  Fevers and weakness  Patient Profile:   Ronnie Ruiz is a 55 y.o. male with with history of type 2 diabetes, hypertension, hyperlipidemia, and known bicuspid aortic valve disease s/p replacement with bioprosthetic valve in 2022 who is being seen 09/26/2020 for the evaluation of endocarditis. Transferred from OSH for TEE  History of Present Illness:   Ronnie Ruiz is a 55 year old gentleman with history of type 2 diabetes, hypertension, hyperlipidemia, and known bicuspid aortic valve disease s/p replacement of the ascending aorta (hemi-arch) using a 39m Hemashield graft, and Biological Bentall Procedure using a 242mEdwards Magna-Ease pericardial valve and a 2819melweave Valsalva graft. He was doing fine till end of may when started having night sweats, fevers, weakness and chills. Was admitted at an OSH for more than a week where he was found to be in AKI, hypotensive, dehydrated and uncontrolled T2DM. After more work up, he was found to have HACGrovelandganism in his blood and was started on Ceftriaxone. TTE was not clear in terms of endocarditis. Hence he was transferred here for TEE and ID eval. Currently, he feels good and has no complains. No CP or SOB. EKG is stable with no signs of CHB.   Past Medical History:  Diagnosis Date  . [place] dependency ([place]/07/2018  . Alcoholism ([place]/07/2018  . Aortic stenosis   . Ascending aortic aneurysm (HCC) 41 mm as measured by echo 07/02/2018  . Atherosclerotic heart disease 06/24/2018  . Bicuspid aortic valve 06/25/2018  . Critical aortic valve stenosis mean gradient 64 mmHg, 06/25/2018  . Diabetes mellitus type 2, controlled ([place]CC[place]/07/2018  . Dyspnea   . Essential hypertension 06/24/2018   . Fatigue 09/15/2020  . GERD (gastroesophageal reflux disease) 06/24/2018  . Heart murmur 06/24/2018  . Hyperlipidemia 06/24/2018  . Night sweats 09/15/2020  . Obesity, diabetes, and hypertension syndrome (HCC[place]1/11/2019  . S/P AVR 09/22/2018   Biological Bentall Procedure using a 25 mm Edwards Magna-Ease pericardial valve and a 28 mm Gelweave Valsalva graft.  . Secondary adhesive capsulitis of left shoulder 08/31/2020  . [facility]   Past Surgical History:  Procedure Laterality Date  . BENTALL PROCEDURE N/A 09/10/2018   Procedure: BENTALL PROCEDURE USING HEMASHIELD 28, GELWEAVE VALSALVA 28MM, AND MAGNA EASE 25MM;  Surgeon: BarGaye PollackD;  [facility] [place];  Laterality: N/A;  . CHOLECYSTECTOMY    . RIGHT HEART CATH AND CORONARY ANGIOGRAPHY N/A 07/07/2018   Procedure: RIGHT HEART CATH AND CORONARY ANGIOGRAPHY;  Surgeon: KelTroy SineD;  [facility] [place] [facility];  Service: Cardiovascular;  Laterality: N/A;  . TEE WITHOUT CARDIOVERSION N/A 09/10/2018   Procedure: TRANSESOPHAGEAL ECHOCARDIOGRAM (TEE);  Surgeon: BarGaye PollackD;  [facility] [place];  Laterality: N/A;     Medications Prior to Admission: Prior to Admission medications   Medication Sig Start Date End Date Taking? Authorizing Provider  aspirin EC 325 MG EC tablet Take 1 tablet (325 mg total) by mouth daily. 09/15/18   Gold, WayPatrick Jupiter PA-C  docusate sodium (COLACE) 250 MG capsule Take 250 mg by mouth daily as needed for constipation.    [provider]  esomeprazole (  NEXIUM) 20 MG capsule Take 20 mg by mouth every evening.    [provider]  lisinopril (ZESTRIL) 10 MG tablet Take 5 mg by mouth daily.    [provider]  metFORMIN (GLUCOPHAGE) 500 MG tablet Take 500 mg by mouth 2 (two) times daily with a meal.    [provider]  metoprolol succinate (TOPROL-XL) 50 MG 24 hr tablet Take 50 mg by mouth  daily.    [provider]  Omega-3 Fatty Acids (FISH OIL) 1000 MG CAPS Take 1,000 mg by mouth 2 (two) times daily.     [provider]  simvastatin (ZOCOR) 40 MG tablet Take 40 mg by mouth daily.    [provider]     Allergies:   No Known Allergies  Social History:   Social History   Socioeconomic History  . Marital status: Married    Spouse name: Not on file  . Number of children: Not on file  . Years of education: Not on file  . Highest education level: Not on file  Occupational History  . Not on file  Tobacco Use  . Smoking status: Never Smoker  . Smokeless tobacco: Current User    Types: Snuff  Vaping Use  . Vaping Use: Never used  Substance and Sexual Activity  . Alcohol use: Yes    Alcohol/week: 2.0 standard drinks    Types: 2 Cans of beer per week    Comment: daily  . Drug use: Never  . Sexual activity: Not on file  Other Topics Concern  . Not on file  Social History Narrative  . Not on file   Social Determinants of Health   Financial Resource Strain: Not on file  Food Insecurity: Not on file  Transportation Needs: Not on file  Physical Activity: Not on file  Stress: Not on file  Social Connections: Not on file  Intimate Partner Violence: Not on file    Family History:  The patient's family history includes Alcoholism in his father; Anxiety disorder in his father; CAD in his father; Hyperlipidemia in his father; Lung cancer in his father and mother; Pulmonary disease in his father and mother; Renal cancer in his father.    ROS:  Please see the history of present illness.  All other ROS reviewed and negative.     Physical Exam/Data:   Vitals:   09/26/20 2121  BP: 119/90  Pulse: 95  Resp: 15  Temp: 98.8 F (37.1 C)  TempSrc: Oral  SpO2: 94%  Weight: 86 kg  Height: _0  (1.727 m)   No intake or output data in the 24 hours ending 09/26/20 2211 Last 3 Weights 09/26/2020 09/15/2020 09/07/2020  Weight (lbs) 189 lb 9.6 oz  193 lb 12.8 oz 199 lb  Weight (kg) 86.002 kg 87.907 kg 90.266 kg     Body mass index is 28.83 kg/m.  General:  Well nourished, well developed, in no acute distress HEENT: normal Lymph: no adenopathy Neck: no JVD Endocrine:  No thryomegaly Vascular: No carotid bruits; FA pulses 2+ bilaterally without bruits  Cardiac:  normal S1, S2; systolic murmur heard Lungs:  clear to auscultation bilaterally, no wheezing, rhonchi or rales  Abd: soft, nontender, no hepatomegaly  Ext: no leg edema Musculoskeletal:  No deformities, BUE and BLE strength normal and equal Skin: warm and dry  Neuro:  CNs 2-12 intact, no focal abnormalities noted Psych:  Normal affect   Relevant CV Studies: TEE pending  Laboratory Data:  High Sensitivity  Troponin:  No results for input(s): TROPONINIHS in the last 720 hours.    ChemistryNo results for input(s): NA, K, CL, CO2, GLUCOSE, BUN, CREATININE, CALCIUM, GFRNONAA, GFRAA, ANIONGAP in the last 168 hours.  No results for input(s): PROT, ALBUMIN, AST, ALT, ALKPHOS, BILITOT in the last 168 hours. HematologyNo results for input(s): WBC, RBC, HGB, HCT, MCV, MCH, MCHC, RDW, PLT in the last 168 hours. BNPNo results for input(s): BNP, PROBNP in the last 168 hours.  DDimer No results for input(s): DDIMER in the last 168 hours.   Assessment and Plan:   # Concern for aortic valve endocarditis s/p bioprosthetic valve replacement in 2020 -Presentation at the OSH concerning for endocarditis -As per OSH, Hacek organism was positive -Repeat Blood cultures, get ESR and CRP -ID consult in morning. Has been receiving IV ceftriaxone at the OSH; for now continue with that -TTE at OSH was not clear; definitely warrants an TEE.  -NPO after midnight; TEE tomorrow -EKG stable; no concern for AV  Block for now. Monitor for potential complications  -C/w aspirin  # HTN -Repeat BMP labs -In the setting of possible infection and soft BP outside, hold meds (lisinopril and metoprolol)  for now  # T2DM: -Hold MTF -Insulin per protocol  # HLD: -C/w simvastatin  # AKI -Repeat Cr -Avoid nephrotoxic toxic agents  Severity of Illness: The appropriate patient status for this patient is INPATIENT. Inpatient status is judged to be reasonable and necessary in order to provide the required intensity of service to ensure the patient's safety. The patient's presenting symptoms, physical exam findings, and initial radiographic and laboratory data in the context of their chronic comorbidities is felt to place them at high risk for further clinical deterioration. Furthermore, it is not anticipated that the patient will be medically stable for discharge from the hospital within 2 midnights of admission. The following factors support the patient status of inpatient.   " The patient's presenting symptoms include night sweats. " The worrisome physical exam findings include concerns for endocarditis. " The initial radiographic and laboratory data are worrisome because of concern for endocarditis. " The chronic co-morbidities include biological AVR and HTN.    I certify that at the point of admission it is my clinical judgment that the patient will require inpatient hospital care spanning beyond 2 midnights from the point of admission due to high intensity of service, high risk for further deterioration and high frequency of surveillance required.    For questions or updates, please contact [place] Please consult [email or web address] for contact info under     Signed, Jaci Lazier, MD  09/26/2020 10:11 PM

## 2020-09-27 ENCOUNTER — Other Ambulatory Visit: Payer: Self-pay

## 2020-09-27 ENCOUNTER — Encounter (HOSPITAL_COMMUNITY): Payer: Self-pay | Admitting: Internal Medicine

## 2020-09-27 ENCOUNTER — Ambulatory Visit: Payer: Managed Care, Other (non HMO) | Admitting: Cardiology

## 2020-09-27 DIAGNOSIS — R7881 Bacteremia: Secondary | ICD-10-CM

## 2020-09-27 DIAGNOSIS — I38 Endocarditis, valve unspecified: Secondary | ICD-10-CM

## 2020-09-27 DIAGNOSIS — I10 Essential (primary) hypertension: Secondary | ICD-10-CM

## 2020-09-27 LAB — BASIC METABOLIC PANEL
Anion gap: 9 (ref 5–15)
BUN: 10 mg/dL (ref 6–20)
CO2: 24 mmol/L (ref 22–32)
Calcium: 9 mg/dL (ref 8.9–10.3)
Chloride: 100 mmol/L (ref 98–111)
Creatinine, Ser: 0.66 mg/dL (ref 0.61–1.24)
GFR, Estimated: >60 mL/min (ref >60)
Glucose, Bld: 162 mg/dL — ABNORMAL HIGH (ref 70–99)
Potassium: 4.4 mmol/L (ref 3.5–5.1)
Sodium: 133 mmol/L — ABNORMAL LOW (ref 135–145)

## 2020-09-27 LAB — SARS CORONAVIRUS 2 (TAT 6-24 HRS): SARS Coronavirus 2: NEGATIVE

## 2020-09-27 LAB — CBC WITH DIFFERENTIAL/PLATELET
Abs Immature Granulocytes: 0.08 10*3/uL — ABNORMAL HIGH (ref 0.00–0.07)
Basophils Absolute: 0.1 10*3/uL (ref 0.0–0.1)
Basophils Relative: 1 %
Eosinophils Absolute: 0.1 10*3/uL (ref 0.0–0.5)
Eosinophils Relative: 2 %
HCT: 31.6 % — ABNORMAL LOW (ref 39.0–52.0)
Hemoglobin: 10.2 g/dL — ABNORMAL LOW (ref 13.0–17.0)
Immature Granulocytes: 1 %
Lymphocytes Relative: 22 %
Lymphs Abs: 1.6 10*3/uL (ref 0.7–4.0)
MCH: 30 pg (ref 26.0–34.0)
MCHC: 32.3 g/dL (ref 30.0–36.0)
MCV: 92.9 fL (ref 80.0–100.0)
Monocytes Absolute: 0.8 10*3/uL (ref 0.1–1.0)
Monocytes Relative: 10 %
Neutro Abs: 4.8 10*3/uL (ref 1.7–7.7)
Neutrophils Relative %: 64 %
Platelets: 405 10*3/uL — ABNORMAL HIGH (ref 150–400)
RBC: 3.4 MIL/uL — ABNORMAL LOW (ref 4.22–5.81)
RDW: 14.1 % (ref 11.5–15.5)
WBC: 7.5 10*3/uL (ref 4.0–10.5)
nRBC: 0 % (ref 0.0–0.2)

## 2020-09-27 LAB — GLUCOSE, CAPILLARY
Glucose-Capillary: 177 mg/dL — ABNORMAL HIGH (ref 70–99)
Glucose-Capillary: 210 mg/dL — ABNORMAL HIGH (ref 70–99)
Glucose-Capillary: 198 mg/dL — ABNORMAL HIGH (ref 70–99)
Glucose-Capillary: 247 mg/dL — ABNORMAL HIGH (ref 70–99)

## 2020-09-27 LAB — SEDIMENTATION RATE: Sed Rate: 97 mm/hr — ABNORMAL HIGH (ref 0–16)

## 2020-09-27 MED ORDER — INSULIN ASPART 100 UNIT/ML IJ SOLN
0.0000 [IU] | Freq: Every day | INTRAMUSCULAR | Status: DC
  Administered 2020-09-27 – 2020-09-28 (×2): 2 [IU] via SUBCUTANEOUS
  Administered 2020-09-29: 3 [IU] via SUBCUTANEOUS
  Administered 2020-09-30 – 2020-10-02 (×3): 2 [IU] via SUBCUTANEOUS

## 2020-09-27 MED ORDER — SODIUM CHLORIDE 0.9 % IV SOLN: INTRAVENOUS | Status: DC

## 2020-09-27 MED ORDER — INSULIN ASPART 100 UNIT/ML IJ SOLN
0.0000 [IU] | Freq: Three times a day (TID) | INTRAMUSCULAR | Status: DC
  Administered 2020-09-27: 3 [IU] via SUBCUTANEOUS
  Administered 2020-09-27: 5 [IU] via SUBCUTANEOUS
  Administered 2020-09-27: 3 [IU] via SUBCUTANEOUS
  Administered 2020-09-28: 5 [IU] via SUBCUTANEOUS
  Administered 2020-09-28: 3 [IU] via SUBCUTANEOUS
  Administered 2020-09-29: 5 [IU] via SUBCUTANEOUS
  Administered 2020-09-29 (×2): 3 [IU] via SUBCUTANEOUS
  Administered 2020-09-30 (×2): 8 [IU] via SUBCUTANEOUS
  Administered 2020-09-30 – 2020-10-01 (×2): 5 [IU] via SUBCUTANEOUS
  Administered 2020-10-01: 8 [IU] via SUBCUTANEOUS
  Administered 2020-10-01 – 2020-10-02 (×3): 5 [IU] via SUBCUTANEOUS
  Administered 2020-10-02: 3 [IU] via SUBCUTANEOUS
  Administered 2020-10-03: 5 [IU] via SUBCUTANEOUS
  Administered 2020-10-03 (×2): 3 [IU] via SUBCUTANEOUS

## 2020-09-27 NOTE — Consult Note (Signed)
[facility]    Date of Admission:  09/26/2020   Total days of antibiotics 8               Reason for Consult: Aggregatibacter bacteremia    Referring Provider: Arvilla Meres, MD Primary Care Provider: [name], MD  Assessment: Blood culture at [facility] grew Aggregatibacter negative for betalamase. Given patient's history of valve replacement, concerning for endocarditis.  TTE done in [place] was unrevealing and cannot rule out endocarditis.  Patient is scheduled for TEE tomorrow.  The source of his infection is likely oropharyngeal.  Low suspicion for urinary, respiratory, or GI source.  Will continue IV ceftriaxone.  Finding on TEE will determine the duration of treatment.  Plan: 1. Continue IV ceftriaxone 2. Pending TEE 3. Follow repeat blood culture  Principal Problem:   Endocarditis Active Problems:   Hyperlipidemia   Essential hypertension   Alcohol dependency (HCC)   Heart murmur   Bicuspid aortic valve   S/P AVR   Fatigue   Bacteremia   Scheduled Meds: . aspirin EC  81 mg Oral Daily  . heparin  5,000 Units Subcutaneous Q8H  . insulin aspart  0-15 Units Subcutaneous TID WC  . insulin aspart  0-5 Units Subcutaneous QHS  . pantoprazole  40 mg Oral Daily  . simvastatin  40 mg Oral Daily   Continuous Infusions: . cefTRIAXone (ROCEPHIN)  IV Stopped (09/27/20 0912)   PRN Meds:.acetaminophen, nitroGLYCERIN, ondansetron (ZOFRAN) IV, zolpidem  HPI: Ronnie Ruiz is a 55 y.o. male with past medical history of type 2 diabetes, hypertension, aortic bicuspid valve replacement, who presented to [facility] for fever night sweats and chills.  Blood culture grew HACEK organism.  TEE was unrevealing.  He was transferred to [facility] to have TEE done.  Patient is seen at bedside today.  He appears comfortable and in no acute distress.  Patient reports feeling well without any complaints.  [place] Spaniel that he has chills a couple  days ago but has resolved.  He denies coughing, sore throat, urinary symptoms, GI symptoms, diabetic foot wounds.  Also denies IV drug use.   Review of Systems: Review of Systems  Constitutional: Negative for chills and fever.  Respiratory: Negative for cough and shortness of breath.   Gastrointestinal: Negative for abdominal pain.  Genitourinary: Negative for dysuria.    Past Medical History:  Diagnosis Date  . Alcohol dependency (HCC) 06/24/2018  . Alcoholism (HCC) 06/24/2018  . Aortic stenosis   . Ascending aortic aneurysm (HCC) 41 mm as measured by echo 07/02/2018  . Atherosclerotic heart disease 06/24/2018  . Bicuspid aortic valve 06/25/2018  . Critical aortic valve stenosis mean gradient 64 mmHg, 06/25/2018  . Diabetes mellitus type 2, controlled (HCC) 06/24/2018  . Dyspnea   . Essential hypertension 06/24/2018  . Fatigue 09/15/2020  . GERD (gastroesophageal reflux disease) 06/24/2018  . Heart murmur 06/24/2018  . Hyperlipidemia 06/24/2018  . Night sweats 09/15/2020  . Obesity, diabetes, and hypertension syndrome (HCC) 02/28/2020  . S/P AVR 09/22/2018   Biological Bentall Procedure using a 25 mm Edwards Magna-Ease pericardial valve and a 28 mm Gelweave Valsalva graft.  . Secondary adhesive capsulitis of left shoulder 08/31/2020  . Thoracic ascending aortic aneurysm [facility])     Social History   Tobacco Use  . Smoking status: Never Smoker  . Smokeless tobacco: Former Neurosurgeon    Types: Snuff  Vaping Use  . Vaping Use: Never used  Substance Use Topics  . Alcohol use: Not Currently    Alcohol/week: 2.0 standard drinks    Types: 2 Cans of beer per week    Comment: daily  . Drug use: Never    Family History  Problem Relation Age of Onset  . Lung cancer Mother   . Pulmonary disease Mother   . Alcoholism Father   . Anxiety disorder Father   . Hyperlipidemia Father   . Lung cancer Father   . CAD Father   . Renal cancer Father   . Pulmonary disease Father    No Known  Allergies  OBJECTIVE: Blood pressure 107/72, pulse 86, temperature (!) 97.5 F (36.4 C), temperature source Oral, resp. rate 14, height 5\' 8"  (1.727 m), weight 85 kg, SpO2 98 %.  Physical Exam Constitutional:      General: He is not in acute distress.    Appearance: Normal appearance. He is not ill-appearing or toxic-appearing.  HENT:     Head: Normocephalic.  Eyes:     General: No scleral icterus.       Right eye: No discharge.        Left eye: No discharge.     Conjunctiva/sclera: Conjunctivae normal.  Cardiovascular:     Rate and Rhythm: Normal rate and regular rhythm.     Heart sounds: Murmur (3/6 systolic murmur heard best at upper sternal border) heard.      Comments: No LE edema Pulmonary:     Effort: Pulmonary effort is normal. No respiratory distress.  Abdominal:     General: Bowel sounds are normal.  Musculoskeletal:        General: Normal range of motion.     Comments: No wounds or ulcer noted on bilateral feet  Skin:    General: Skin is warm.     Coloration: Skin is not jaundiced.  Neurological:     Mental Status: He is alert and oriented to person, place, and time. Mental status is at baseline.  Psychiatric:        Mood and Affect: Mood normal.        Thought Content: Thought content normal.        Judgment: Judgment normal.     Lab Results Lab Results  Component Value Date   WBC 7.5 09/27/2020   HGB 10.2 (L) 09/27/2020   HCT 31.6 (L) 09/27/2020   MCV 92.9 09/27/2020   PLT 405 (H) 09/27/2020    Lab Results  Component Value Date   CREATININE 0.66 09/27/2020   BUN 10 09/27/2020   NA 133 (L) 09/27/2020   K 4.4 09/27/2020   CL 100 09/27/2020   CO2 24 09/27/2020    Lab Results  Component Value Date   ALT 25 09/26/2020   AST 24 09/26/2020   ALKPHOS 60 09/26/2020   BILITOT 0.4 09/26/2020     Microbiology: No results found for this or any previous visit (from the past 240 hour(s)).  11/26/2020, [facility] for Infectious  Disease [facility] [phone] pager   [phone] cell 09/27/2020, 10:11 AM

## 2020-09-27 NOTE — Progress Notes (Addendum)
Progress Note  Patient Name: Ronnie Ruiz Date of Encounter: 09/27/2020  [facility] Cardiologist: Ronnie Balsam, MD   Subjective   Reports feeling improved from initial presentation to [facility]. No complaints of fever, chest pain, SOB, palpitations, or LE edema.  Inpatient Medications    Scheduled Meds: . aspirin EC  81 mg Oral Daily  . heparin  5,000 Units Subcutaneous Q8H  . insulin aspart  0-15 Units Subcutaneous TID WC  . insulin aspart  0-5 Units Subcutaneous QHS  . pantoprazole  40 mg Oral Daily  . simvastatin  40 mg Oral Daily   Continuous Infusions: . cefTRIAXone (ROCEPHIN)  IV 2 g (09/27/20 0842)   PRN Meds: acetaminophen, nitroGLYCERIN, ondansetron (ZOFRAN) IV, zolpidem   Vital Signs    Vitals:   09/26/20 2121 09/26/20 2320 09/27/20 0335 09/27/20 0747  BP: 119/90 110/70 107/83 107/72  Pulse: 95 85 88 86  Resp: 15 13 12 14   Temp: 98.8 F (37.1 C) 98.7 F (37.1 C) 98.6 F (37 C) (!) 97.5 F (36.4 C)  TempSrc: Oral Oral Oral Oral  SpO2: 94% 97% 96% 98%  Weight: 86 kg  85 kg   Height: 5\' 8"  (1.727 m)       Intake/Output Summary (Last 24 hours) at 09/27/2020 0913 Last data filed at 09/27/2020 0700 Gross per 24 hour  Intake --  Output 650 ml  Net -650 ml   Last 3 Weights 09/27/2020 09/26/2020 09/15/2020  Weight (lbs) 187 lb 8 oz 189 lb 9.6 oz 193 lb 12.8 oz  Weight (kg) 85.049 kg 86.002 kg 87.907 kg      Telemetry    Sinus rhythm with brief episode of SVT lasting ~9 seconds earlier this morning - Personally Reviewed  ECG    No new tracings - Personally Reviewed  Physical Exam   GEN: Laying in bed in no acute distress.   HEENT: No JVD, sclera anicteric, poor dentition Cardiac: RRR, +murmurs, valve click, no rubs or gallops.  Respiratory: Clear to auscultation bilaterally. GI: Soft, nontender, non-distended  MS: No edema; No deformity. Neuro:  Nonfocal  Psych: Normal affect   Labs    High Sensitivity Troponin:  No results for input(s):  TROPONINIHS in the last 720 hours.    Chemistry Recent Labs  Lab 09/26/20 2223 09/27/20 0821  NA 132* 133*  K 4.1 4.4  CL 98 100  CO2 26 24  GLUCOSE 222* 162*  BUN 11 10  CREATININE 0.78 0.66  CALCIUM 8.9 9.0  PROT 6.7  --   ALBUMIN 2.5*  --   AST 24  --   ALT 25  --   ALKPHOS 60  --   BILITOT 0.4  --   GFRNONAA >60 >60  ANIONGAP 8 9     Hematology Recent Labs  Lab 09/26/20 2223 09/27/20 0821  WBC 8.2 7.5  RBC 3.24* 3.40*  HGB 9.6* 10.2*  HCT 30.1* 31.6*  MCV 92.9 92.9  MCH 29.6 30.0  MCHC 31.9 32.3  RDW 13.9 14.1  PLT 452* 405*    BNPNo results for input(s): BNP, PROBNP in the last 168 hours.   DDimer No results for input(s): DDIMER in the last 168 hours.   Radiology    No results found.  Cardiac Studies   Echocardiogram 09/21/20: dictated from [facility] report 1.  This was a technically difficult study with suboptimal views.  Per patient valve replacement, patient thinks aortic valve. 2.overall left ventricular systolic function is normal with an EF  between 60 to 65% 3.  Left atrium is mildly dilated. 4.  Peak/mean gradient across the valve is 24 mmHg/14 mmHg, AVA 2.2 cm2 5.  Mild mitral regurgitation is present. 6.  Mild tricuspid regurgitation present. 7.  Right ventricular systolic pressure as measured by Doppler is 33 mmHg. 8.  Cannot R/O endocarditis based on this TDS study.  TEE should be considered.    Patient Profile     55 y.o. male with a PMH of bicuspid aortic valve s/p bioprosthetic AVR 2020, HTN, HLD, and DM type 2, who was found to have HACEK organism bacteremia at [facility], transferred to [facility] for TEE.   Assessment & Plan     1. HACEK organism bacteremia: patient presented to [facility] with complaints of fevers, weakness, and fatigue. He was seen outpatient 09/15/20 by Dr. Tomie Ruiz reporting night sweats and fatigue, recommended to undergo an echocardiogram and complete BCx given history of AVR. Reportedly had  positive BCx and was instructed to present to the ED for further management. BCx 09/15/20 reportedly positive for aggregatibacter (HACEK organism) felt to be 2/2 poor dentition. He was started on Ceftriaxone 09/20/20. Echocardiogram was unrevealing with EF 60-65%, mild LAE, mild MR/TR, and AV s/p replacement with mean gradient 14 mmHg; can not r/o endocarditis. He was seen by Dr. Bing Ruiz in consultation and given history of AVR he was recommended to transfer to [facility] for TEE and ID consultation  - ID consulted and will see today - appreciate assistance - Medicine consulted and will assume care as primary team 09/28/20 - appreciate assistance - Will continue ceftriaxone 2g daily for now - Will plan for TEE 09/28/20 with Dr. Jacques Ruiz at 1pm.  - Shared Decision Making/Informed Consent{ The risks [esophageal damage, perforation (1:10,000 risk), bleeding, pharyngeal hematoma as well as other potential complications associated with conscious sedation including aspiration, arrhythmia, respiratory failure and death], benefits (treatment guidance and diagnostic support) and alternatives of a transesophageal echocardiogram were discussed in detail with Ronnie Ruiz and he is willing to proceed.   2. Severe AS in patient with bicuspid aortic valve s/p bioprosthetic AVR/Bentall procedure 2020: c/f possible endocarditis given #1. Unable to r/o on TTE and recommended to undergo TEE. Likely bacteremia 2/2 poor dentition and patient may benefit from extraction of remaining teeth to prevent future episodes.  - Will follow-up TEE results   3. HTN: intermittent hypotension noted from [facility]. Home lisinopril and metoprolol on hold - Continue to monitor and reintroduce home medications as needed  4. HLD: LDL 105 02/2020 - Continue simvastatin  5. Minimal non-obstructive CAD: 5% pLAD disease noted on cath in 2020 prior to AVR. No anginal complaints. EKG non-ischemic - Continue aspirin and statin  6. DM type 2: poorly  controlled with A1C 9.2 09/19/20. On metformin prior to admission - Will ask DM coordinator to see - Continue ISS - Medicine team to take over tomorrow for further management  7. Hyponatremia: Na down to 125 on presentation to [facility], improved to 132 on labs 09/26/20. Felt to be 2/2 volume depletion.  - Continue to monitor closely  8. AKI: Cr up to 1.5 on admission, now back to baseline 0.78 on labs 09/26/20.  - Continue to monitor closely  9. SVT: brief episode lasting ~9 seconds earlier this morning. Patient was asymptomatic  - Will follow-up electrolytes on morning labs      For questions or updates, please contact [facility] Please consult [email or web address] for contact info under  Signed, Beatriz Stallion, PA-C  09/27/2020, 9:13 AM    Attending Note:   The patient was seen and examined.  Agree with assessment and plan as noted above.  Changes made to the above note as needed.  Patient seen and independently examined with  Judy Pimple, PA .   We discussed all aspects of the encounter. I agree with the assessment and plan as stated above.  1.   Bacteremia:   Hx of AVR.   Has fevers, night sweats Blood culture grew Aggregatibacter For TEE tomorrow  We have discussed risks, benefits, options Ive reminded him to go to the dentist regularly  Will transfer to Hospitalist team tomorrow .   2.  S/p AVR:   For TEE tomorrow .     3. HTN:   BP is stable for now  4.  Minimal CAD   5 DM - poorly controlled.  Further recs per triad hospitalist team  DM coordinator to see   6.  SVT - nonsustained.    I have spent a total of 40 minutes with patient reviewing hospital  notes , telemetry, EKGs, labs and examining patient as well as establishing an assessment and plan that was discussed with the patient. > 50% of time was spent in direct patient care.    Vesta Mixer, Montez Hageman., MD, [facility] 09/27/2020, 10:41 AM [address],  [address] Office [phone] Pager [phone]

## 2020-09-27 NOTE — Progress Notes (Signed)
Pt coughed while this RN was in the room, had a run of SVT with HR at 142; no pain or shortness of breath. Pt instructed to bear down as if he was having a bowel movement. HR is now sustaining at 84. Will continue to monitor.   Bari Edward, RN

## 2020-09-27 NOTE — Progress Notes (Signed)
Inpatient Diabetes Program Recommendations  AACE/ADA: New Consensus Statement on Inpatient Glycemic Control (2015)  Target Ranges:  Prepandial:   less than 140 mg/dL      Peak postprandial:   less than 180 mg/dL (1-2 hours)      Critically ill patients:  140 - 180 mg/dL   Lab Results  Component Value Date   GLUCAP 177 (H) 09/27/2020   HGBA1C 7.4 (H) 02/28/2020    Review of Glycemic Control Results for Ronnie Ruiz, Ronnie Ruiz (MRN [id number]) as of 09/27/2020 11:08  Ref. Range 09/26/2020 21:15 09/27/2020 07:46  Glucose-Capillary Latest Ref Range: 70 - 99 mg/dL 892 (H) 119 (H)   Diabetes history:  DM2 Outpatient Diabetes medications:  Metformin 500 mg BID Current orders for Inpatient glycemic control:  Novolog 0-15 units TID and 0-5 units QHS  Received referral for poorly controlled DM with A1C of 9.2% (indicates average blood sugar of 217 mg/dL).  Spoke with patient at bedside.  He states he has a steroid injection in his shoulder approximately 1 month ago and since then his blood sugars have been consistently > 200 mg/dL.  Explained the steroid injection and complications with his heart can also increase blood sugars.  He does not drink any beverages with sugar.  He does not check his blood sugar consistently.  Asked him to monitor daily until CBG's are back down.  Prior to steroid injection, he states his average blood sugar was 130 mg/dL.  Encouraged him to modify CHO's and ordered LWWD.  Might consider increasing Metformin 1000 mg BID for now and adding Tradjenta 5 mg daily.    Will continue to follow while inpatient.  Thank you, Dulce Sellar, RN, BSN Diabetes Coordinator Inpatient Diabetes Program [phone] (team pager from 8a-5p)

## 2020-09-28 ENCOUNTER — Encounter (HOSPITAL_COMMUNITY)
Admission: AD | Disposition: A | Discharge status: Home Health | Payer: Self-pay | Source: Other Acute Inpatient Hospital | Attending: Internal Medicine

## 2020-09-28 ENCOUNTER — Inpatient Hospital Stay (HOSPITAL_COMMUNITY): Payer: Managed Care, Other (non HMO) | Admitting: Anesthesiology

## 2020-09-28 ENCOUNTER — Inpatient Hospital Stay (HOSPITAL_COMMUNITY): Payer: Managed Care, Other (non HMO)

## 2020-09-28 DIAGNOSIS — R7881 Bacteremia: Secondary | ICD-10-CM | POA: Diagnosis not present

## 2020-09-28 DIAGNOSIS — I10 Essential (primary) hypertension: Secondary | ICD-10-CM | POA: Diagnosis not present

## 2020-09-28 DIAGNOSIS — E43 Unspecified severe protein-calorie malnutrition: Secondary | ICD-10-CM

## 2020-09-28 DIAGNOSIS — Q231 Congenital insufficiency of aortic valve: Secondary | ICD-10-CM | POA: Diagnosis not present

## 2020-09-28 DIAGNOSIS — Q211 Atrial septal defect: Secondary | ICD-10-CM

## 2020-09-28 DIAGNOSIS — E782 Mixed hyperlipidemia: Secondary | ICD-10-CM

## 2020-09-28 DIAGNOSIS — R5383 Other fatigue: Secondary | ICD-10-CM

## 2020-09-28 HISTORY — PX: BUBBLE STUDY: SHX6837

## 2020-09-28 HISTORY — DX: Unspecified severe protein-calorie malnutrition: E43

## 2020-09-28 HISTORY — PX: TEE WITHOUT CARDIOVERSION: SHX5443

## 2020-09-28 LAB — GLUCOSE, CAPILLARY
Glucose-Capillary: 189 mg/dL — ABNORMAL HIGH (ref 70–99)
Glucose-Capillary: 136 mg/dL — ABNORMAL HIGH (ref 70–99)
Glucose-Capillary: 250 mg/dL — ABNORMAL HIGH (ref 70–99)
Glucose-Capillary: 240 mg/dL — ABNORMAL HIGH (ref 70–99)

## 2020-09-28 LAB — CBC WITH DIFFERENTIAL/PLATELET
Abs Immature Granulocytes: 0.07 10*3/uL (ref 0.00–0.07)
Basophils Absolute: 0.1 10*3/uL (ref 0.0–0.1)
Basophils Relative: 1 %
Eosinophils Absolute: 0.2 10*3/uL (ref 0.0–0.5)
Eosinophils Relative: 2 %
HCT: 31.1 % — ABNORMAL LOW (ref 39.0–52.0)
Hemoglobin: 10.2 g/dL — ABNORMAL LOW (ref 13.0–17.0)
Immature Granulocytes: 1 %
Lymphocytes Relative: 23 %
Lymphs Abs: 2.5 10*3/uL (ref 0.7–4.0)
MCH: 30.4 pg (ref 26.0–34.0)
MCHC: 32.8 g/dL (ref 30.0–36.0)
MCV: 92.8 fL (ref 80.0–100.0)
Monocytes Absolute: 0.8 10*3/uL (ref 0.1–1.0)
Monocytes Relative: 7 %
Neutro Abs: 7.4 10*3/uL (ref 1.7–7.7)
Neutrophils Relative %: 66 %
Platelets: 390 10*3/uL (ref 150–400)
RBC: 3.35 MIL/uL — ABNORMAL LOW (ref 4.22–5.81)
RDW: 13.9 % (ref 11.5–15.5)
WBC: 11 10*3/uL — ABNORMAL HIGH (ref 4.0–10.5)
nRBC: 0 % (ref 0.0–0.2)

## 2020-09-28 LAB — BASIC METABOLIC PANEL
Anion gap: 8 (ref 5–15)
BUN: 10 mg/dL (ref 6–20)
CO2: 26 mmol/L (ref 22–32)
Calcium: 9.1 mg/dL (ref 8.9–10.3)
Chloride: 99 mmol/L (ref 98–111)
Creatinine, Ser: 0.7 mg/dL (ref 0.61–1.24)
GFR, Estimated: >60 mL/min (ref >60)
Glucose, Bld: 195 mg/dL — ABNORMAL HIGH (ref 70–99)
Potassium: 3.8 mmol/L (ref 3.5–5.1)
Sodium: 133 mmol/L — ABNORMAL LOW (ref 135–145)

## 2020-09-28 SURGERY — ECHOCARDIOGRAM, TRANSESOPHAGEAL
Anesthesia: Monitor Anesthesia Care

## 2020-09-28 MED ORDER — LIDOCAINE HCL (PF) 2 % IJ SOLN
INTRAMUSCULAR | Status: DC | PRN
  Administered 2020-09-28: 100 mg via INTRADERMAL

## 2020-09-28 MED ORDER — BUTAMBEN-TETRACAINE-BENZOCAINE 2-2-14 % EX AERO
INHALATION_SPRAY | CUTANEOUS | Status: DC | PRN
  Administered 2020-09-28: 2 via TOPICAL

## 2020-09-28 MED ORDER — PHENYLEPHRINE 40 MCG/ML (10ML) SYRINGE FOR IV PUSH (FOR BLOOD PRESSURE SUPPORT)
PREFILLED_SYRINGE | INTRAVENOUS | Status: DC | PRN
  Administered 2020-09-28 (×2): 120 ug via INTRAVENOUS

## 2020-09-28 MED ORDER — ADULT MULTIVITAMIN W/MINERALS CH
1.0000 | ORAL_TABLET | Freq: Every day | ORAL | Status: DC
  Administered 2020-09-29 – 2020-10-03 (×5): 1 via ORAL
  Filled 2020-09-28 (×5): qty 1

## 2020-09-28 MED ORDER — LACTATED RINGERS IV SOLN: INTRAVENOUS | Status: DC | PRN

## 2020-09-28 MED ORDER — ENSURE MAX PROTEIN PO LIQD
11.0000 [oz_av] | Freq: Two times a day (BID) | ORAL | Status: DC
  Administered 2020-09-28 – 2020-10-03 (×3): 11 [oz_av] via ORAL
  Filled 2020-09-28 (×13): qty 330

## 2020-09-28 MED ORDER — METOPROLOL TARTRATE 25 MG PO TABS
25.0000 mg | ORAL_TABLET | Freq: Two times a day (BID) | ORAL | Status: DC
  Administered 2020-09-28 – 2020-10-03 (×12): 25 mg via ORAL
  Filled 2020-09-28 (×12): qty 1

## 2020-09-28 MED ORDER — PROPOFOL 10 MG/ML IV BOLUS
INTRAVENOUS | Status: DC | PRN
  Administered 2020-09-28: 50 mg via INTRAVENOUS
  Administered 2020-09-28: 30 mg via INTRAVENOUS
  Administered 2020-09-28: 40 mg via INTRAVENOUS

## 2020-09-28 MED ORDER — PROPOFOL 500 MG/50ML IV EMUL
INTRAVENOUS | Status: DC | PRN
  Administered 2020-09-28 (×2): 125 ug/kg/min via INTRAVENOUS

## 2020-09-28 NOTE — Plan of Care (Signed)

## 2020-09-28 NOTE — Progress Notes (Addendum)
Progress Note  Patient Name: Ronnie Ruiz Date of Encounter: 09/28/2020  [facility] Cardiologist: Gypsy Balsam, MD   Subjective   No complaints today. Anxious to get results of TEE.   Inpatient Medications    Scheduled Meds:  aspirin EC  81 mg Oral Daily   heparin  5,000 Units Subcutaneous Q8H   insulin aspart  0-15 Units Subcutaneous TID WC   insulin aspart  0-5 Units Subcutaneous QHS   pantoprazole  40 mg Oral Daily   simvastatin  40 mg Oral Daily   Continuous Infusions:  sodium chloride 20 mL/hr at 09/28/20 0338   cefTRIAXone (ROCEPHIN)  IV Stopped (09/27/20 0912)   PRN Meds: acetaminophen, nitroGLYCERIN, ondansetron (ZOFRAN) IV, zolpidem   Vital Signs    Vitals:   09/27/20 2007 09/27/20 2024 09/28/20 0333 09/28/20 0534  BP: 124/67  99/73 124/70  Pulse: (!) 112  84 88  Resp: 20 17 16 18   Temp: 99 F (37.2 C)  97.6 F (36.4 C) 98 F (36.7 C)  TempSrc: Oral  Oral Oral  SpO2: 93%  96% 98%  Weight:   84.6 kg   Height:        Intake/Output Summary (Last 24 hours) at 09/28/2020 0647 Last data filed at 09/27/2020 1400 Gross per 24 hour  Intake 820 ml  Output 650 ml  Net 170 ml   Last 3 Weights 09/28/2020 09/27/2020 09/26/2020  Weight (lbs) 186 lb 6.4 oz 187 lb 8 oz 189 lb 9.6 oz  Weight (kg) 84.55 kg 85.049 kg 86.002 kg      Telemetry    Sinus to sinus tachycardia 80-100s - Personally Reviewed  ECG    No new tracings - Personally Reviewed  Physical Exam   GEN: No acute distress.   Neck: No JVD Cardiac: RRR, systolic murmur on exam Respiratory: Clear to auscultation bilaterally. GI: Soft, nontender, non-distended  MS: No edema; No deformity. Neuro:  Nonfocal  Psych: Normal affect   Labs    High Sensitivity Troponin:  No results for input(s): TROPONINIHS in the last 720 hours.    Chemistry Recent Labs  Lab 09/26/20 2223 09/27/20 0821 09/28/20 0353  NA 132* 133* 133*  K 4.1 4.4 3.8  CL 98 100 99  CO2 26 24 26   GLUCOSE 222* 162* 195*   BUN 11 10 10   CREATININE 0.78 0.66 0.70  CALCIUM 8.9 9.0 9.1  PROT 6.7  --   --   ALBUMIN 2.5*  --   --   AST 24  --   --   ALT 25  --   --   ALKPHOS 60  --   --   BILITOT 0.4  --   --   GFRNONAA >60 >60 >60  ANIONGAP 8 9 8      Hematology Recent Labs  Lab 09/26/20 2223 09/27/20 0821 09/28/20 0353  WBC 8.2 7.5 11.0*  RBC 3.24* 3.40* 3.35*  HGB 9.6* 10.2* 10.2*  HCT 30.1* 31.6* 31.1*  MCV 92.9 92.9 92.8  MCH 29.6 30.0 30.4  MCHC 31.9 32.3 32.8  RDW 13.9 14.1 13.9  PLT 452* 405* 390    BNPNo results for input(s): BNP, PROBNP in the last 168 hours.   DDimer No results for input(s): DDIMER in the last 168 hours.   Radiology    No results found.  Cardiac Studies   Echocardiogram 09/21/20: dictated from [facility] report 1.  This was a technically difficult study with suboptimal views.  Per patient valve replacement,  patient thinks aortic valve. 2.overall left ventricular systolic function is normal with an EF between 60 to 65% 3.  Left atrium is mildly dilated. 4.  Peak/mean gradient across the valve is 24 mmHg/14 mmHg, AVA 2.2 cm2 5.  Mild mitral regurgitation is present. 6.  Mild tricuspid regurgitation present. 7.  Right ventricular systolic pressure as measured by Doppler is 33 mmHg. 8.  Cannot R/O endocarditis based on this TDS study.  TEE should be considered.    Patient Profile     55 y.o. male with a PMH of bicuspid aortic valve s/p bioprosthetic AVR 2020, HTN, HLD, and DM type 2, who was found to have HACEK organism bacteremia at [facility], transferred to [facility] for TEE.   Assessment & Plan    HACEK organism bacteremia - cultures grew aggregatibacter negative for betalamase  - planning for TEE today - ABX per ID   Severe AS due to bicuspid aortic valve s/p bioprosthetic AVR/Bentall procedure 2020  - await TEE - murmur on exam   Hypertension - home lisinopril on hold given AKI and intermittent hypotension - consider adding back  BB   Nonobstructive CAD - continue ASA, no angina   Hyperlipidemia with LDL goal > 70 02/28/2020: Cholesterol, Total 192; HDL 48; LDL Chol Calc (NIH) 105; Triglycerides 226 - on 40 mg zocor - consider switching to a high intensity statin for better LDL   SVT - brief episode lasting 9 sec 09/27/20, asymptomatic - no further SVT seen on telemetry - Mg and K WNL - consider adding back BB when able   DM2 AKI Hyponatremia - per primary      For questions or updates, please contact [facility] Please consult [email or web address] for contact info under        Signed, Marcelino Duster, PA  09/28/2020, 6:47 AM    Attending Note:   The patient was seen and examined.  Agree with assessment and plan as noted above.  Changes made to the above note as needed.  Patient seen and independently examined with Bettina Gavia, PA .   We discussed all aspects of the encounter. I agree with the assessment and plan as stated above.     Bacteremia / possible endocardits.   For TEE today . Abx recs per ID and IM team .   2. HTN:  Lisinopril is on hold because of AKI.   Restart metoprolol 25 BID ( he was on toprol xl 50 mg a day at home )     I have spent a total of 40 minutes with patient reviewing hospital  notes , telemetry, EKGs, labs and examining patient as well as establishing an assessment and plan that was discussed with the patient.  > 50% of time was spent in direct patient care.    Vesta Mixer, Montez Hageman., MD, [facility] 09/28/2020, 9:11 AM [address],  [address] Office [phone] Pager [phone]

## 2020-09-28 NOTE — Progress Notes (Signed)
   09/28/20 2030  Assess: MEWS Score  Temp (!) 100.7 F (38.2 C)  BP 120/68  Pulse Rate (!) 107  ECG Heart Rate (!) 106  Resp 12  Level of Consciousness Alert  SpO2 95 %  O2 Device Room Air  Assess: MEWS Score  MEWS Temp 1  MEWS Systolic 0  MEWS Pulse 1  MEWS RR 1  MEWS LOC 0  MEWS Score 3  MEWS Score Color Yellow  Assess: if the MEWS score is Yellow or Red  Were vital signs taken at a resting state? Yes  Focused Assessment No change from prior assessment  Early Detection of Sepsis Score *See Row Information* Medium  MEWS guidelines implemented *See Row Information* Yes  Treat  MEWS Interventions Administered scheduled meds/treatments;Administered prn meds/treatments (Scheduled metoprolol given for elevated HR.  PRN Tylenol given for elevated temp.)  Pain Scale 0-10  Pain Score 0  Patients Stated Pain Goal 0  Take Vital Signs  Increase Vital Sign Frequency  Yellow: Q 2hr X 2 then Q 4hr X 2, if remains yellow, continue Q 4hrs  Escalate  MEWS: Escalate Yellow: discuss with charge nurse/RN and consider discussing with provider and RRT  Notify: Charge Nurse/RN  Name of Charge Nurse/RN Notified Rockwell Alexandria, RN  Date Charge Nurse/RN Notified 09/28/20  Time Charge Nurse/RN Notified 2140  Document  Patient Outcome Stabilized after interventions  Progress note created (see row info) Yes

## 2020-09-28 NOTE — Transfer of Care (Signed)
Immediate Anesthesia Transfer of Care Note  Patient: Ronnie Ruiz  Procedure(s) Performed: TRANSESOPHAGEAL ECHOCARDIOGRAM (TEE) BUBBLE STUDY  Patient Location: Endoscopy Unit  Anesthesia Type:MAC  Level of Consciousness: drowsy  Airway & Oxygen Therapy: Patient Spontanous Breathing  Post-op Assessment: Report given to RN and Post -op Vital signs reviewed and stable  Post vital signs: Reviewed and stable  Last Vitals:  Vitals Value Taken Time  BP 100/58 09/28/20 1341  Temp 38.1 C 09/28/20 1341  Pulse 76 09/28/20 1342  Resp 17 09/28/20 1342  SpO2 99 % 09/28/20 1342  Vitals shown include unvalidated device data.  Last Pain:  Vitals:   09/28/20 1341  TempSrc: Axillary  PainSc: Asleep      Patients Stated Pain Goal: 0 (41/42/39 5320)  Complications: No notable events documented.

## 2020-09-28 NOTE — CV Procedure (Addendum)
INDICATIONS: bacteremia  PROCEDURE:   Informed consent was obtained prior to the procedure. The risks, benefits and alternatives for the procedure were discussed and the patient comprehended these risks.  Risks include, but are not limited to, cough, sore throat, vomiting, nausea, somnolence, esophageal and stomach trauma or perforation, bleeding, low blood pressure, aspiration, pneumonia, infection, trauma to the teeth and death.    After a procedural time-out, the oropharynx was anesthetized with 20% benzocaine spray.   During this procedure the patient was administered propofol per anesthesia.  The patient's heart rate, blood pressure, and oxygen saturation were monitored continuously during the procedure. The period of conscious sedation was 30 minutes, of which I was present face-to-face 100% of this time.  The transesophageal probe was inserted in the esophagus and stomach without difficulty and multiple views were obtained.  The patient was kept under observation until the patient left the procedure room.  The patient left the procedure room in stable condition.   Agitated microbubble saline contrast was administered.  COMPLICATIONS:    There were no immediate complications.  FINDINGS:   FORMAL ECHOCARDIOGRAM REPORT PENDING Normal biventricular chamber size and function.  Normal aortic bioprosthesis with normal function and no regurgitation or paravalvular leak. No dehiscense or rocking. Prominent post operative changes around valve posteriorly and aortic graft, There is an echo free space posterior and lateral to the prosthesis, which appears contiguous with the native sinus of Valsalva, without flow demonstrated. This was also possibly present on CT from 2021, and may not be an acute finding. If there is a strong clinical suspicion for endocarditis, will consider gated cardiac CTA of the valve and aortic root to visualize this area well in the setting of fevers and bacteremia.     No other valvular lesions noted.   PFO with bidirectional shunt.   RECOMMENDATIONS/CONCLUSIONS:    No definite evidence of acute endocarditis or valvular abscess, however some findings cannot be clearly distinguished from postoperative change. Consider gated CTA heart for evaluation of the aortic root to evaluate the echo free space noted above and posterior aortic root.   Time Spent Directly with the Patient:  60 minutes   Darryon Bastin A Jacques Navy 09/28/2020, 1:48 PM

## 2020-09-28 NOTE — Anesthesia Preprocedure Evaluation (Addendum)
Anesthesia Evaluation  Patient identified by MRN, date of birth, ID band Patient awake    Reviewed: Allergy & Precautions, NPO status , Patient's Chart, lab work & pertinent test results  History of Anesthesia Complications Negative for: history of anesthetic complications  Airway Mallampati: II  TM Distance: >3 FB Neck ROM: Full    Dental  (+) Poor Dentition, Missing   Pulmonary neg pulmonary ROS,    Pulmonary exam normal breath sounds clear to auscultation       Cardiovascular hypertension, + CAD (nonobstructive)  + Valvular Problems/Murmurs (s/p AVR 2020) AS  Rhythm:Regular Rate:Normal + Systolic murmurs    Neuro/Psych negative neurological ROS     GI/Hepatic Neg liver ROS, GERD  ,  Endo/Other  diabetes, Poorly Controlled, Type 2, Oral Hypoglycemic Agents  Renal/GU negative Renal ROS  negative genitourinary   Musculoskeletal negative musculoskeletal ROS (+)   Abdominal   Peds  Hematology negative hematology ROS (+)   Anesthesia Other Findings  Aggregatibacter septicemia and h/o AVR- TEE to r/o endocarditis  Reproductive/Obstetrics                            Anesthesia Physical Anesthesia Plan  ASA: 3  Anesthesia Plan: MAC   Post-op Pain Management:    Induction: Intravenous  PONV Risk Score and Plan: 1 and Propofol infusion, TIVA and Treatment may vary due to age or medical condition  Airway Management Planned: Natural Airway, Nasal Cannula and Simple Face Mask  Additional Equipment: None  Intra-op Plan:   Post-operative Plan:   Informed Consent: I have reviewed the patients History and Physical, chart, labs and discussed the procedure including the risks, benefits and alternatives for the proposed anesthesia with the patient or authorized representative who has indicated his/her understanding and acceptance.       Plan Discussed with:   Anesthesia Plan Comments:          Anesthesia Quick Evaluation

## 2020-09-28 NOTE — Progress Notes (Signed)
[facility]  Date of Admission:  09/26/2020   Total days of antibiotics 9         ASSESSMENT: Patient appears clinically well, afebrile overnight.  Pending TEE today to rule out endocarditis, the result will determine the duration of treatment.  Continue IV ceftriaxone for Aggregatibacter bacteremia.   PLAN: Continue IV ceftriaxone TEE today Follow-up blood culture Patient will need dental evaluation after discharge  Principal Problem:   Endocarditis Active Problems:   Hyperlipidemia   Essential hypertension   Alcohol dependency (HCC)   Heart murmur   Bicuspid aortic valve   S/P AVR   Fatigue   Bacteremia   Protein-calorie malnutrition, severe   Scheduled Meds:  aspirin EC  81 mg Oral Daily   heparin  5,000 Units Subcutaneous Q8H   insulin aspart  0-15 Units Subcutaneous TID WC   insulin aspart  0-5 Units Subcutaneous QHS   metoprolol tartrate  25 mg Oral BID   pantoprazole  40 mg Oral Daily   simvastatin  40 mg Oral Daily   Continuous Infusions:  sodium chloride 20 mL/hr at 09/28/20 0338   cefTRIAXone (ROCEPHIN)  IV Stopped (09/28/20 0922)   PRN Meds:.acetaminophen, nitroGLYCERIN, ondansetron (ZOFRAN) IV, zolpidem   SUBJECTIVE: Patient is seen at bedside.  He appears comfortable and in no acute distress.  States that he is feeling well with no acute complaints.  Review of Systems: ROS Per HPI  No Known Allergies  OBJECTIVE: Vitals:   09/27/20 2007 09/27/20 2024 09/28/20 0333 09/28/20 0534  BP: 124/67  99/73 124/70  Pulse: (!) 112  84 88  Resp: 20 17 16 18   Temp: 99 F (37.2 C)  97.6 F (36.4 C) 98 F (36.7 C)  TempSrc: Oral  Oral Oral  SpO2: 93%  96% 98%  Weight:   84.6 kg   Height:       Body mass index is 28.34 kg/m.  Physical Exam Constitutional:      General: He is not in acute distress.    Appearance: He is not ill-appearing or toxic-appearing.  HENT:     Head: Normocephalic.     Mouth/Throat:      Comments: Poor dentition Eyes:     General: No scleral icterus.       Right eye: No discharge.        Left eye: No discharge.     Conjunctiva/sclera: Conjunctivae normal.  Cardiovascular:     Rate and Rhythm: Normal rate and regular rhythm.     Heart sounds: Murmur (3/6 systolic murmur) heard.  Pulmonary:     Effort: Pulmonary effort is normal. No respiratory distress.  Abdominal:     General: There is no distension.     Tenderness: There is no abdominal tenderness.  Skin:    General: Skin is warm.     Coloration: Skin is not jaundiced.  Neurological:     Mental Status: He is alert. Mental status is at baseline.  Psychiatric:        Mood and Affect: Mood normal.        Thought Content: Thought content normal.        Judgment: Judgment normal.    Lab Results Lab Results  Component Value Date   WBC 11.0 (H) 09/28/2020   HGB 10.2 (L) 09/28/2020   HCT 31.1 (L) 09/28/2020   MCV 92.8 09/28/2020   PLT 390 09/28/2020    Lab Results  Component Value Date  CREATININE 0.70 09/28/2020   BUN 10 09/28/2020   NA 133 (L) 09/28/2020   K 3.8 09/28/2020   CL 99 09/28/2020   CO2 26 09/28/2020    Lab Results  Component Value Date   ALT 25 09/26/2020   AST 24 09/26/2020   ALKPHOS 60 09/26/2020   BILITOT 0.4 09/26/2020     Microbiology: Recent Results (from the past 240 hour(s))  Culture, blood (routine x 2)     Status: None (Preliminary result)   Collection Time: 09/26/20 11:48 PM   Specimen: BLOOD  Result Value Ref Range Status   Specimen Description BLOOD SITE NOT SPECIFIED  Final   Special Requests   Final    BOTTLES DRAWN AEROBIC AND ANAEROBIC Blood Culture results may not be optimal due to an excessive volume of blood received in culture bottles   Culture   Final    NO GROWTH 1 DAY Performed at [facility], [address] [place], [place] [address]    Report Status PENDING  Incomplete  Culture, blood (routine x 2)     Status: None (Preliminary result)    Collection Time: 09/26/20 11:48 PM   Specimen: BLOOD  Result Value Ref Range Status   Specimen Description BLOOD SITE NOT SPECIFIED  Final   Special Requests   Final    BOTTLES DRAWN AEROBIC AND ANAEROBIC Blood Culture adequate volume   Culture   Final    NO GROWTH 1 DAY Performed at [facility], [address] [place], [place] [address]    Report Status PENDING  Incomplete  SARS CORONAVIRUS 2 (TAT 6-24 HRS) Nasopharyngeal Nasopharyngeal Swab     Status: None   Collection Time: 09/27/20  3:20 AM   Specimen: Nasopharyngeal Swab  Result Value Ref Range Status   SARS Coronavirus 2 NEGATIVE NEGATIVE Final    Comment: (NOTE) SARS-CoV-2 target nucleic acids are NOT DETECTED.  The SARS-CoV-2 RNA is generally detectable in upper and lower respiratory specimens during the acute phase of infection. Negative results do not preclude SARS-CoV-2 infection, do not rule out co-infections with other pathogens, and should not be used as the sole basis for treatment or other patient management decisions. Negative results must be combined with clinical observations, patient history, and epidemiological information. The expected result is Negative.  Fact Sheet for Patients: HairSlick.no  Fact Sheet for Healthcare Providers: quierodirigir.com  This test is not yet approved or cleared by the [place] FDA and  has been authorized for detection and/or diagnosis of SARS-CoV-2 by FDA under an Emergency Use Authorization (EUA). This EUA will remain  in effect (meaning this test can be used) for the duration of the COVID-19 declaration under Se ction 564(b)(1) of the Act, 21 U.S.C. section 360bbb-3(b)(1), unless the authorization is terminated or revoked sooner.  Performed at [facility], [address], [place], [place] [address]     Doran Stabler, DO [facility] [phone] pager   [phone] cell 09/28/2020, 10:45 AM

## 2020-09-28 NOTE — Progress Notes (Addendum)
Initial Nutrition Assessment  DOCUMENTATION CODES:   Severe malnutrition in context of acute illness/injury  INTERVENTION:   Ensure Max po BID, each supplement provides 150 kcal and 30 grams of protein. HS snack daily. MVI with minerals daily.  NUTRITION DIAGNOSIS:   Severe Malnutrition related to acute illness (endocarditis) as evidenced by moderate muscle depletion, percent weight loss (9% weight loss within 1 month).  GOAL:   Patient will meet greater than or equal to 90% of their needs  MONITOR:   PO intake, Supplement acceptance, Labs  REASON FOR ASSESSMENT:   Malnutrition Screening Tool    ASSESSMENT:   55 yo male transferred from OSH for TEE to evaluate for endocarditis, aggregatobacter septicemia. PMH includes DM-2, HTN, HLD, bicuspid aortic valve disease s/p bioprosthetic valve replacement.  Patient reports poor intake d/t not feeling well for the past 1-2 months. He was making himself eat, but he was only eating around half of his usual intake. Discussed importance of adequate protein and calorie intake for healing and recovery. Patient willing to drink Ensure supplements between meals. Since admission, his appetite has improved some. Currently NPO for TEE today. Previously on a heart healthy diet, consuming 100% of meals.   Labs reviewed. Na 133 CBG: [phone]  Medications reviewed and include Novolog, Protonix, IV Rocephin. IVF: NS at 20 ml/h  Patient meets criteria for malnutrition, given moderate depletion of muscle mass, 9% weight loss within 1 month, and intake 50% of usual intake.   NUTRITION - FOCUSED PHYSICAL EXAM:  Flowsheet Row Most Recent Value  Orbital Region Mild depletion  Upper Arm Region Mild depletion  Thoracic and Lumbar Region No depletion  Buccal Region Mild depletion  Temple Region Mild depletion  Clavicle Bone Region Mild depletion  Clavicle and Acromion Bone Region Moderate depletion  Scapular Bone Region Mild depletion  Dorsal  Hand Mild depletion  Patellar Region Mild depletion  Anterior Thigh Region Mild depletion  Posterior Calf Region Moderate depletion  Edema (RD Assessment) None  Hair Reviewed  Eyes Reviewed  Mouth Reviewed  Skin Reviewed  Nails Reviewed       Diet Order:   Diet Order             Diet NPO time specified Except for: Sips with Meds  Diet effective midnight                   EDUCATION NEEDS:   Education needs have been addressed  Skin:  Skin Assessment: Reviewed RN Assessment                                                     Last BM:  6/6  Height:   Ht Readings from Last 1 Encounters:  09/26/20 5\' 8"  (1.727 m)    Weight:   Wt Readings from Last 1 Encounters:  09/28/20 84.6 kg    Ideal Body Weight:  70 kg  BMI:  Body mass index is 28.34 kg/m.  Estimated Nutritional Needs:   Kcal:  2400-2600  Protein:  120-140 gm  Fluid:  >/= 2.4 L   11/28/20, RD, LDN, CNSC Please refer to Amion for contact information.                                                      ,

## 2020-09-28 NOTE — H&P (View-Only) (Signed)
 Progress Note  Patient Name: Ronnie Ruiz Date of Encounter: 09/28/2020  [facility] Cardiologist: Robert Krasowski, MD   Subjective   No complaints today. Anxious to get results of TEE.   Inpatient Medications    Scheduled Meds:  aspirin EC  81 mg Oral Daily   heparin  5,000 Units Subcutaneous Q8H   insulin aspart  0-15 Units Subcutaneous TID WC   insulin aspart  0-5 Units Subcutaneous QHS   pantoprazole  40 mg Oral Daily   simvastatin  40 mg Oral Daily   Continuous Infusions:  sodium chloride 20 mL/hr at 09/28/20 0338   cefTRIAXone (ROCEPHIN)  IV Stopped (09/27/20 0912)   PRN Meds: acetaminophen, nitroGLYCERIN, ondansetron (ZOFRAN) IV, zolpidem   Vital Signs    Vitals:   09/27/20 2007 09/27/20 2024 09/28/20 0333 09/28/20 0534  BP: 124/67  99/73 124/70  Pulse: (!) 112  84 88  Resp: 20 17 16 18  Temp: 99 F (37.2 C)  97.6 F (36.4 C) 98 F (36.7 C)  TempSrc: Oral  Oral Oral  SpO2: 93%  96% 98%  Weight:   84.6 kg   Height:        Intake/Output Summary (Last 24 hours) at 09/28/2020 0647 Last data filed at 09/27/2020 1400 Gross per 24 hour  Intake 820 ml  Output 650 ml  Net 170 ml   Last 3 Weights 09/28/2020 09/27/2020 09/26/2020  Weight (lbs) 186 lb 6.4 oz 187 lb 8 oz 189 lb 9.6 oz  Weight (kg) 84.55 kg 85.049 kg 86.002 kg      Telemetry    Sinus to sinus tachycardia 80-100s - Personally Reviewed  ECG    No new tracings - Personally Reviewed  Physical Exam   GEN: No acute distress.   Neck: No JVD Cardiac: RRR, systolic murmur on exam Respiratory: Clear to auscultation bilaterally. GI: Soft, nontender, non-distended  MS: No edema; No deformity. Neuro:  Nonfocal  Psych: Normal affect   Labs    High Sensitivity Troponin:  No results for input(s): TROPONINIHS in the last 720 hours.    Chemistry Recent Labs  Lab 09/26/20 2223 09/27/20 0821 09/28/20 0353  NA 132* 133* 133*  K 4.1 4.4 3.8  CL 98 100 99  CO2 26 24 26  GLUCOSE 222* 162* 195*   BUN 11 10 10  CREATININE 0.78 0.66 0.70  CALCIUM 8.9 9.0 9.1  PROT 6.7  --   --   ALBUMIN 2.5*  --   --   AST 24  --   --   ALT 25  --   --   ALKPHOS 60  --   --   BILITOT 0.4  --   --   GFRNONAA >60 >60 >60  ANIONGAP 8 9 8     Hematology Recent Labs  Lab 09/26/20 2223 09/27/20 0821 09/28/20 0353  WBC 8.2 7.5 11.0*  RBC 3.24* 3.40* 3.35*  HGB 9.6* 10.2* 10.2*  HCT 30.1* 31.6* 31.1*  MCV 92.9 92.9 92.8  MCH 29.6 30.0 30.4  MCHC 31.9 32.3 32.8  RDW 13.9 14.1 13.9  PLT 452* 405* 390    BNPNo results for input(s): BNP, PROBNP in the last 168 hours.   DDimer No results for input(s): DDIMER in the last 168 hours.   Radiology    No results found.  Cardiac Studies   Echocardiogram 09/21/20: dictated from [facility] report 1.  This was a technically difficult study with suboptimal views.  Per patient valve replacement,   patient thinks aortic valve. 2.overall left ventricular systolic function is normal with an EF between 60 to 65% 3.  Left atrium is mildly dilated. 4.  Peak/mean gradient across the valve is 24 mmHg/14 mmHg, AVA 2.2 cm2 5.  Mild mitral regurgitation is present. 6.  Mild tricuspid regurgitation present. 7.  Right ventricular systolic pressure as measured by Doppler is 33 mmHg. 8.  Cannot R/O endocarditis based on this TDS study.  TEE should be considered.    Patient Profile     55 y.o. male with a PMH of bicuspid aortic valve s/p bioprosthetic AVR 2020, HTN, HLD, and DM type 2, who was found to have HACEK organism bacteremia at [facility], transferred to [facility] for TEE.   Assessment & Plan    HACEK organism bacteremia - cultures grew aggregatibacter negative for betalamase  - planning for TEE today - ABX per ID   Severe AS due to bicuspid aortic valve s/p bioprosthetic AVR/[place] procedure 2020  - await TEE - murmur on exam   Hypertension - home lisinopril on hold given AKI and intermittent hypotension - consider adding back  BB   Nonobstructive CAD - continue ASA, no angina   Hyperlipidemia with LDL goal > 70 02/28/2020: Cholesterol, Total 192; HDL 48; LDL Chol Calc (NIH) 105; Triglycerides 226 - on 40 mg zocor - consider switching to a high intensity statin for better LDL   SVT - brief episode lasting 9 sec 09/27/20, asymptomatic - no further SVT seen on telemetry - Mg and K WNL - consider adding back BB when able   DM2 AKI Hyponatremia - per primary      For questions or updates, please contact [facility] Please consult [email or web address] for contact info under        Signed, Marcelino Duster, PA  09/28/2020, 6:47 AM    Attending Note:   The patient was seen and examined.  Agree with assessment and plan as noted above.  Changes made to the above note as needed.  Patient seen and independently examined with Bettina Gavia, PA .   We discussed all aspects of the encounter. I agree with the assessment and plan as stated above.     Bacteremia / possible endocardits.   For TEE today . Abx recs per ID and IM team .   2. HTN:  Lisinopril is on hold because of AKI.   Restart metoprolol 25 BID ( he was on toprol xl 50 mg a day at home )     I have spent a total of 40 minutes with patient reviewing hospital  notes , telemetry, EKGs, labs and examining patient as well as establishing an assessment and plan that was discussed with the patient.  > 50% of time was spent in direct patient care.    Vesta Mixer, Montez Hageman., MD, [facility] 09/28/2020, 9:11 AM [address],  [address] Office [phone] Pager [phone]

## 2020-09-28 NOTE — Interval H&P Note (Signed)
History and Physical Interval Note:  09/28/2020 12:39 PM  Ronnie Ruiz  has presented today for surgery, with the diagnosis of bacteremia.  The various methods of treatment have been discussed with the patient and family. After consideration of risks, benefits and other options for treatment, the patient has consented to  Procedure(s): TRANSESOPHAGEAL ECHOCARDIOGRAM (TEE) (N/A) as a surgical intervention.  The patient's history has been reviewed, patient examined, no change in status, stable for surgery.  I have reviewed the patient's chart and labs.  Questions were answered to the patient's satisfaction.     Parke Poisson

## 2020-09-28 NOTE — Progress Notes (Signed)
PROGRESS NOTE    Ronnie Kanariserry W Loree  ZOX:[id number] DOB: 01-Jan-1966 DOA: 09/26/2020 PCP: Abigail MiyamotoPerry, Lawrence Edward, MD    Brief Narrative:  Ronnie Ruiz is a 55 year old male with past medical history significant for essential hypertension, hyperlipidemia, type 2 diabetes mellitus, history of by cuspid aortic valve s/p bioprosthetic AVR 2020 who was found to have HAECK organism bacteremia at [facility] and was [facility] on [facility] for TEE and ID consultation.  Patient [facility].   Assessment & Plan:   Principal Problem:   Endocarditis Active Problems:   Hyperlipidemia   Essential hypertension   Alcohol dependency (HCC)   Heart murmur   Bicuspid aortic valve   S/P AVR   Fatigue   Bacteremia   Protein-calorie malnutrition, severe   Aggregatobacter (HAECK) septicemia, POA Patient initially presented to [facility] with fever, weakness and fatigue.  He was originally seen by outpatient cardiology on 09/15/2020 by Dr. Tomie Chinaevankar with reports of night sweats and fatigue and recommended to undergo a echocardiogram, blood cultures given AVR.  Patient was reported to have positive blood cultures and sent to the ED for further management.  Blood cultures from 09/15/2020 reportedly positive for aggregatibacter (HAECK organism), which is felt to be secondary to his poor dentition.  He was started on ceftriaxone on 09/20/2020.  TTE was unrevealing with LVEF 60 to 65%, mild LAE, mild MR/TR, and AV s/p replacement with mean gradient 14 mmHg, but cannot rule out endocarditis.  He was seen by Dr. Bing MatterKrasowski in consultation given history of AVR, was recommended to [facility] for [facility] and ID consultation. --Cardiology and infectious disease following, appreciate assistance --Repeat blood cultures 6/7: Pending --Ceftriaxone 2 g IV every 24 hours --TEE: Pending today --CBC daily, monitor fever curve  Severe AS with bicuspid  aortic valve s/p bioprosthetic AVR 2020 --Cardiology following as above, TEE today  Essential hypertension Patient was noted to have intermittent hypotension at [facility].  On lisinopril and metoprolol at home. --BP 124/70 this morning --Continue metoprolol tartrate 25 mg p.o. twice daily --Continue to hold home lisinopril for now --Continue aspirin and statin --Continue monitor BP closely  Hyperlipidemia --Continue simvastatin 40 mg p.o. daily  Type 2 diabetes mellitus, with hyperglycemia Hemoglobin A1c 9.2 on 09/19/2020, poorly controlled.  On metformin 500 mg p.o. twice daily outpatient. --Hold home metformin while inpatient; would benefit from increased with as milligrams twice daily on discharge --SSI for coverage --CBGs before every meal/at bedtime  GERD: Continue PPI  Severe protein calorie malnutrition Body mass index is 28.34 kg/m.  Notable moderate muscle depletion and 9% weight loss in 1 month. Nutrition Status: Nutrition Problem: Severe Malnutrition Etiology: acute illness (endocarditis) Signs/Symptoms: moderate muscle depletion, percent weight loss (9% weight loss within 1 month) Percent weight loss: 9 % Interventions: Ensure Enlive (each supplement provides 350kcal and 20 grams of protein), MVI --Dietitian following, appreciate assistance. --Continue to encourage increased oral intake, supplements    DVT prophylaxis: heparin injection 5,000 Units Start: 09/26/20 2300    Code Status: Full Code Family Communication: None present at bedside  Disposition Plan:  Level of care: Progressive Status is: Inpatient  Remains inpatient appropriate because:Ongoing diagnostic testing needed not appropriate for outpatient work up, Unsafe d/c plan, IV treatments appropriate due to intensity of illness or inability to take PO, and Inpatient level of care appropriate due to severity of illness  Dispo: The patient is from: Home  Anticipated d/c is to:  Home              Patient currently is not medically stable to d/c.   Difficult to place patient No  Consultants:  Cardiology Infectious disease  Procedures:  TTE TEE: Pending  Antimicrobials:  Ceftriaxone   Subjective: Patient seen examined bedside, resting comfortably.  Slightly anxious in anticipation of TEE today.  States that he should have had his remaining teeth pulled sometime ago.  No other questions or concerns at this time.  Denies any recurrent fevers, no chest pain, no palpitations, no shortness of breath, no abdominal pain, no weakness, no fatigue, no paresthesias.  No acute events overnight per nursing staff.  Objective: Vitals:   09/27/20 2007 09/27/20 2024 09/28/20 0333 09/28/20 0534  BP: 124/67  99/73 124/70  Pulse: (!) 112  84 88  Resp: 20 17 16 18   Temp: 99 F (37.2 C)  97.6 F (36.4 C) 98 F (36.7 C)  TempSrc: Oral  Oral Oral  SpO2: 93%  96% 98%  Weight:   84.6 kg   Height:        Intake/Output Summary (Last 24 hours) at 09/28/2020 1022 Last data filed at 09/28/2020 0948 Gross per 24 hour  Intake 460 ml  Output --  Net 460 ml   Filed Weights   09/26/20 2121 09/27/20 0335 09/28/20 0333  Weight: 86 kg 85 kg 84.6 kg    Examination:  General exam: Appears calm and comfortable, poor dentition, moderate muscle wasting and fat depletion noted Respiratory system: Clear to auscultation. Respiratory effort normal.  On room air Cardiovascular system: S1 & S2 heard, RRR. No JVD, murmurs, rubs, gallops or clicks. No pedal edema. Gastrointestinal system: Abdomen is nondistended, soft and nontender. No organomegaly or masses felt. Normal bowel sounds heard. Central nervous system: Alert and oriented. No focal neurological deficits. Extremities: Symmetric 5 x 5 power. Skin: No rashes, lesions or ulcers Psychiatry: Judgement and insight appear normal. Mood & affect appropriate.     Data Reviewed: I have personally reviewed following labs and imaging  studies  CBC: Recent Labs  Lab 09/26/20 2223 09/27/20 0821 09/28/20 0353  WBC 8.2 7.5 11.0*  NEUTROABS 5.6 4.8 7.4  HGB 9.6* 10.2* 10.2*  HCT 30.1* 31.6* 31.1*  MCV 92.9 92.9 92.8  PLT 452* 405* 390   Basic Metabolic Panel: Recent Labs  Lab 09/26/20 2223 09/27/20 0821 09/28/20 0353  NA 132* 133* 133*  K 4.1 4.4 3.8  CL 98 100 99  CO2 26 24 26   GLUCOSE 222* 162* 195*  BUN 11 10 10   CREATININE 0.78 0.66 0.70  CALCIUM 8.9 9.0 9.1  MG 1.9  --   --    GFR: Estimated Creatinine Clearance: 109.2 mL/min (by C-G formula based on SCr of 0.7 mg/dL). Liver Function Tests: Recent Labs  Lab 09/26/20 2223  AST 24  ALT 25  ALKPHOS 60  BILITOT 0.4  PROT 6.7  ALBUMIN 2.5*   No results for input(s): LIPASE, AMYLASE in the last 168 hours. No results for input(s): AMMONIA in the last 168 hours. Coagulation Profile: Recent Labs  Lab 09/26/20 2223  INR 1.0   Cardiac Enzymes: No results for input(s): CKTOTAL, CKMB, CKMBINDEX, TROPONINI in the last 168 hours. BNP (last 3 results) No results for input(s): PROBNP in the last 8760 hours. HbA1C: No results for input(s): HGBA1C in the last 72 hours. CBG: Recent Labs  Lab 09/27/20 0746 09/27/20 1207 09/27/20 1627 09/27/20 2124 09/28/20 11/27/20  GLUCAP 177* 210* 198* 247* 189*   Lipid Profile: No results for input(s): CHOL, HDL, LDLCALC, TRIG, CHOLHDL, LDLDIRECT in the last 72 hours. Thyroid Function Tests: No results for input(s): TSH, T4TOTAL, FREET4, T3FREE, THYROIDAB in the last 72 hours. Anemia Panel: No results for input(s): VITAMINB12, FOLATE, FERRITIN, TIBC, IRON, RETICCTPCT in the last 72 hours. Sepsis Labs: No results for input(s): PROCALCITON, LATICACIDVEN in the last 168 hours.  Recent Results (from the past 240 hour(s))  Culture, blood (routine x 2)     Status: None (Preliminary result)   Collection Time: 09/26/20 11:48 PM   Specimen: BLOOD  Result Value Ref Range Status   Specimen Description BLOOD SITE  NOT SPECIFIED  Final   Special Requests   Final    BOTTLES DRAWN AEROBIC AND ANAEROBIC Blood Culture results may not be optimal due to an excessive volume of blood received in culture bottles   Culture   Final    NO GROWTH 1 DAY Performed at [facility], [address], [place], [place] [address]    Report Status PENDING  Incomplete  Culture, blood (routine x 2)     Status: None (Preliminary result)   Collection Time: 09/26/20 11:48 PM   Specimen: BLOOD  Result Value Ref Range Status   Specimen Description BLOOD SITE NOT SPECIFIED  Final   Special Requests   Final    BOTTLES DRAWN AEROBIC AND ANAEROBIC Blood Culture adequate volume   Culture   Final    NO GROWTH 1 DAY Performed at [facility], [address], [place], [place] [address]    Report Status PENDING  Incomplete  SARS CORONAVIRUS 2 (TAT 6-24 HRS) Nasopharyngeal Nasopharyngeal Swab     Status: None   Collection Time: 09/27/20  3:20 AM   Specimen: Nasopharyngeal Swab  Result Value Ref Range Status   SARS Coronavirus 2 NEGATIVE NEGATIVE Final    Comment: (NOTE) SARS-CoV-2 target nucleic acids are NOT DETECTED.  The SARS-CoV-2 RNA is generally detectable in upper and lower respiratory specimens during the acute phase of infection. Negative results do not preclude SARS-CoV-2 infection, do not rule out co-infections with other pathogens, and should not be used as the sole basis for treatment or other patient management decisions. Negative results must be combined with clinical observations, patient history, and epidemiological information. The expected result is Negative.  Fact Sheet for Patients: HairSlick.no  Fact Sheet for Healthcare Providers: quierodirigir.com  This test is not yet approved or cleared by the [place] FDA and  has been authorized for detection and/or diagnosis of SARS-CoV-2 by FDA under an Emergency Use Authorization (EUA).  This EUA will remain  in effect (meaning this test can be used) for the duration of the COVID-19 declaration under Se ction 564(b)(1) of the Act, 21 U.S.C. section 360bbb-3(b)(1), unless the authorization is terminated or revoked sooner.  Performed at [facility], [address], [place], [place] [address]          Radiology Studies: No results found.      Scheduled Meds:  aspirin EC  81 mg Oral Daily   heparin  5,000 Units Subcutaneous Q8H   insulin aspart  0-15 Units Subcutaneous TID WC   insulin aspart  0-5 Units Subcutaneous QHS   metoprolol tartrate  25 mg Oral BID   pantoprazole  40 mg Oral Daily   simvastatin  40 mg Oral Daily   Continuous Infusions:  sodium chloride 20 mL/hr at 09/28/20 [id number]  cefTRIAXone (ROCEPHIN)  IV Stopped (09/28/20 0922)     LOS: 2 days    Time spent: 43 minutes spent on chart review, discussion with nursing staff, consultants, updating family and interview/physical exam; more than 50% of that time was spent in counseling and/or coordination of care.    Alvira Philips [place], DO [facility] Available via Epic secure chat 7am-7pm After these hours, please refer to coverage provider listed on [email or web address] 09/28/2020, 10:22 AM

## 2020-09-29 ENCOUNTER — Encounter (HOSPITAL_COMMUNITY): Payer: Self-pay | Admitting: Internal Medicine

## 2020-09-29 ENCOUNTER — Inpatient Hospital Stay (HOSPITAL_COMMUNITY): Payer: Managed Care, Other (non HMO)

## 2020-09-29 DIAGNOSIS — Z952 Presence of prosthetic heart valve: Secondary | ICD-10-CM

## 2020-09-29 DIAGNOSIS — Q231 Congenital insufficiency of aortic valve: Secondary | ICD-10-CM | POA: Diagnosis not present

## 2020-09-29 DIAGNOSIS — R7881 Bacteremia: Secondary | ICD-10-CM

## 2020-09-29 DIAGNOSIS — R011 Cardiac murmur, unspecified: Secondary | ICD-10-CM

## 2020-09-29 DIAGNOSIS — B3321 Viral endocarditis: Secondary | ICD-10-CM

## 2020-09-29 DIAGNOSIS — I330 Acute and subacute infective endocarditis: Secondary | ICD-10-CM

## 2020-09-29 DIAGNOSIS — E785 Hyperlipidemia, unspecified: Secondary | ICD-10-CM

## 2020-09-29 DIAGNOSIS — I10 Essential (primary) hypertension: Secondary | ICD-10-CM

## 2020-09-29 DIAGNOSIS — N179 Acute kidney failure, unspecified: Secondary | ICD-10-CM

## 2020-09-29 DIAGNOSIS — A4189 Other specified sepsis: Secondary | ICD-10-CM

## 2020-09-29 DIAGNOSIS — E1165 Type 2 diabetes mellitus with hyperglycemia: Secondary | ICD-10-CM

## 2020-09-29 DIAGNOSIS — T826XXA Infection and inflammatory reaction due to cardiac valve prosthesis, initial encounter: Secondary | ICD-10-CM

## 2020-09-29 LAB — CBC WITH DIFFERENTIAL/PLATELET
Abs Immature Granulocytes: 0.06 10*3/uL (ref 0.00–0.07)
Basophils Absolute: 0.1 10*3/uL (ref 0.0–0.1)
Basophils Relative: 1 %
Eosinophils Absolute: 0.1 10*3/uL (ref 0.0–0.5)
Eosinophils Relative: 1 %
HCT: 32.6 % — ABNORMAL LOW (ref 39.0–52.0)
Hemoglobin: 10.5 g/dL — ABNORMAL LOW (ref 13.0–17.0)
Immature Granulocytes: 1 %
Lymphocytes Relative: 21 %
Lymphs Abs: 2.2 10*3/uL (ref 0.7–4.0)
MCH: 29.8 pg (ref 26.0–34.0)
MCHC: 32.2 g/dL (ref 30.0–36.0)
MCV: 92.6 fL (ref 80.0–100.0)
Monocytes Absolute: 0.8 10*3/uL (ref 0.1–1.0)
Monocytes Relative: 8 %
Neutro Abs: 7 10*3/uL (ref 1.7–7.7)
Neutrophils Relative %: 68 %
Platelets: 385 10*3/uL (ref 150–400)
RBC: 3.52 MIL/uL — ABNORMAL LOW (ref 4.22–5.81)
RDW: 14 % (ref 11.5–15.5)
WBC: 10.2 10*3/uL (ref 4.0–10.5)
nRBC: 0 % (ref 0.0–0.2)

## 2020-09-29 LAB — GLUCOSE, CAPILLARY
Glucose-Capillary: 189 mg/dL — ABNORMAL HIGH (ref 70–99)
Glucose-Capillary: 151 mg/dL — ABNORMAL HIGH (ref 70–99)
Glucose-Capillary: 227 mg/dL — ABNORMAL HIGH (ref 70–99)
Glucose-Capillary: 265 mg/dL — ABNORMAL HIGH (ref 70–99)

## 2020-09-29 LAB — BASIC METABOLIC PANEL
Anion gap: 8 (ref 5–15)
BUN: 11 mg/dL (ref 6–20)
CO2: 25 mmol/L (ref 22–32)
Calcium: 9.1 mg/dL (ref 8.9–10.3)
Chloride: 102 mmol/L (ref 98–111)
Creatinine, Ser: 0.72 mg/dL (ref 0.61–1.24)
GFR, Estimated: >60 mL/min (ref >60)
Glucose, Bld: 214 mg/dL — ABNORMAL HIGH (ref 70–99)
Potassium: 4.4 mmol/L (ref 3.5–5.1)
Sodium: 135 mmol/L (ref 135–145)

## 2020-09-29 LAB — ECHO TEE
AR max vel: 2.86 cm2
AV Area VTI: 2.74 cm2
AV Area mean vel: 2.83 cm2
AV Mean grad: 10 mmHg
AV Peak grad: 16.1 mmHg
Ao pk vel: 2.01 m/s

## 2020-09-29 MED ORDER — DOCUSATE SODIUM 50 MG PO CAPS
50.0000 mg | ORAL_CAPSULE | Freq: Every day | ORAL | Status: DC | PRN
  Administered 2020-09-30: 50 mg via ORAL
  Filled 2020-09-29 (×2): qty 1

## 2020-09-29 MED ORDER — IOHEXOL 350 MG/ML SOLN
100.0000 mL | Freq: Once | INTRAVENOUS | Status: AC | PRN
  Administered 2020-09-29: 100 mL via INTRAVENOUS

## 2020-09-29 NOTE — Progress Notes (Signed)
[facility]  Date of Admission:  09/26/2020   Total days of antibiotics 10         ASSESSMENT: Patient developed a temperature of 100.7 overnight, but otherwise feeling well.  TEE was performed which showed a free space in the prosthetic aortic valve which cannot completely rule out endocarditis.  CT coronary was ordered to take a better look at the aortic valve.  Per chart review, his blood culture grew Aggregatibacter in May 27 and again in June.  Patient will need a longer course of antibiotics to assure clearance this time.  If CT coronary is negative, will plan 4 weeks of IV ceftriaxone.  Patient will also need to follow-up with his dentist outpatient given dental is likely the source of Aggregatibacter .   PLAN: Continue IV ceftriaxone Pending CT coronary results Following repeat blood culture result  Principal Problem:   Endocarditis Active Problems:   Hyperlipidemia   Essential hypertension   Alcohol dependency (HCC)   Heart murmur   Bicuspid aortic valve   S/P AVR   Fatigue   Bacteremia   Protein-calorie malnutrition, severe   Scheduled Meds:  aspirin EC  81 mg Oral Daily   heparin  5,000 Units Subcutaneous Q8H   insulin aspart  0-15 Units Subcutaneous TID WC   insulin aspart  0-5 Units Subcutaneous QHS   metoprolol tartrate  25 mg Oral BID   multivitamin with minerals  1 tablet Oral Daily   pantoprazole  40 mg Oral Daily   Ensure Max Protein  11 oz Oral BID   simvastatin  40 mg Oral Daily   Continuous Infusions:  cefTRIAXone (ROCEPHIN)  IV 2 g (09/29/20 0845)   PRN Meds:.acetaminophen, nitroGLYCERIN, ondansetron (ZOFRAN) IV, zolpidem   SUBJECTIVE: Patient is seen at bedside.  He appears comfortable and in no acute distress.  He said he developed a fever overnight.  Patient states that he has seen dentist in the past and has had his teeth removed for his open heart procedure.  He states that he will follow-up with dentist  as soon as he is got out of the hospital.  Review of Systems: ROS Per HPI  No Known Allergies  OBJECTIVE: Vitals:   09/28/20 2200 09/28/20 2235 09/29/20 0501 09/29/20 0817  BP:  102/64 (!) 98/57 108/65  Pulse:  94 82   Resp: 20 14 16 18   Temp:  99.3 F (37.4 C) 97.6 F (36.4 C) 97.7 F (36.5 C)  TempSrc:  Oral Oral Oral  SpO2:  95% 96% 97%  Weight:      Height:       Body mass index is 28.34 kg/m.  Physical Exam Constitutional:      General: He is not in acute distress.    Appearance: He is not ill-appearing or toxic-appearing.  HENT:     Head: Normocephalic.     Mouth/Throat:     Comments: Missing teeth Eyes:     General: No scleral icterus.       Right eye: No discharge.        Left eye: No discharge.  Cardiovascular:     Rate and Rhythm: Normal rate and regular rhythm.     Heart sounds: Murmur (3/6 systolic murmur) heard.  Pulmonary:     Effort: Pulmonary effort is normal. No respiratory distress.  Musculoskeletal:     Cervical back: Normal range of motion.  Skin:    General: Skin is warm.  Coloration: Skin is not jaundiced.  Neurological:     Mental Status: He is alert. Mental status is at baseline.  Psychiatric:        Mood and Affect: Mood normal.        Thought Content: Thought content normal.        Judgment: Judgment normal.    Lab Results Lab Results  Component Value Date   WBC 10.2 09/29/2020   HGB 10.5 (L) 09/29/2020   HCT 32.6 (L) 09/29/2020   MCV 92.6 09/29/2020   PLT 385 09/29/2020    Lab Results  Component Value Date   CREATININE 0.72 09/29/2020   BUN 11 09/29/2020   NA 135 09/29/2020   K 4.4 09/29/2020   CL 102 09/29/2020   CO2 25 09/29/2020    Lab Results  Component Value Date   ALT 25 09/26/2020   AST 24 09/26/2020   ALKPHOS 60 09/26/2020   BILITOT 0.4 09/26/2020     Microbiology: Recent Results (from the past 240 hour(s))  Culture, blood (routine x 2)     Status: None (Preliminary result)   Collection Time:  09/26/20 11:48 PM   Specimen: BLOOD  Result Value Ref Range Status   Specimen Description BLOOD SITE NOT SPECIFIED  Final   Special Requests   Final    BOTTLES DRAWN AEROBIC AND ANAEROBIC Blood Culture results may not be optimal due to an excessive volume of blood received in culture bottles   Culture   Final    NO GROWTH 2 DAYS Performed at [facility], [address], [place], [place] [address]    Report Status PENDING  Incomplete  Culture, blood (routine x 2)     Status: None (Preliminary result)   Collection Time: 09/26/20 11:48 PM   Specimen: BLOOD  Result Value Ref Range Status   Specimen Description BLOOD SITE NOT SPECIFIED  Final   Special Requests   Final    BOTTLES DRAWN AEROBIC AND ANAEROBIC Blood Culture adequate volume   Culture   Final    NO GROWTH 2 DAYS Performed at [facility], [address], [place], [place] [address]    Report Status PENDING  Incomplete  SARS CORONAVIRUS 2 (TAT 6-24 HRS) Nasopharyngeal Nasopharyngeal Swab     Status: None   Collection Time: 09/27/20  3:20 AM   Specimen: Nasopharyngeal Swab  Result Value Ref Range Status   SARS Coronavirus 2 NEGATIVE NEGATIVE Final    Comment: (NOTE) SARS-CoV-2 target nucleic acids are NOT DETECTED.  The SARS-CoV-2 RNA is generally detectable in upper and lower respiratory specimens during the acute phase of infection. Negative results do not preclude SARS-CoV-2 infection, do not rule out co-infections with other pathogens, and should not be used as the sole basis for treatment or other patient management decisions. Negative results must be combined with clinical observations, patient history, and epidemiological information. The expected result is Negative.  Fact Sheet for Patients: HairSlick.no  Fact Sheet for Healthcare Providers: quierodirigir.com  This test is not yet approved or cleared by the [place] FDA and  has been  authorized for detection and/or diagnosis of SARS-CoV-2 by FDA under an Emergency Use Authorization (EUA). This EUA will remain  in effect (meaning this test can be used) for the duration of the COVID-19 declaration under Se ction 564(b)(1) of the Act, 21 U.S.C. section 360bbb-3(b)(1), unless the authorization is terminated or revoked sooner.  Performed at [facility], [address] [place], [place]  [address]     Doran Stabler, DO [facility] [phone] pager   [phone] cell 09/29/2020, 11:00 AM

## 2020-09-29 NOTE — Progress Notes (Addendum)
Progress Note  Patient Name: Ronnie Ruiz Date of Encounter: 09/29/2020  [facility] Cardiologist: Gypsy Balsam, MD   Subjective   Awaiting CT.   Inpatient Medications    Scheduled Meds:  aspirin EC  81 mg Oral Daily   heparin  5,000 Units Subcutaneous Q8H   insulin aspart  0-15 Units Subcutaneous TID WC   insulin aspart  0-5 Units Subcutaneous QHS   metoprolol tartrate  25 mg Oral BID   multivitamin with minerals  1 tablet Oral Daily   pantoprazole  40 mg Oral Daily   Ensure Max Protein  11 oz Oral BID   simvastatin  40 mg Oral Daily   Continuous Infusions:  cefTRIAXone (ROCEPHIN)  IV Stopped (09/28/20 0922)   PRN Meds: acetaminophen, nitroGLYCERIN, ondansetron (ZOFRAN) IV, zolpidem   Vital Signs    Vitals:   09/28/20 2030 09/28/20 2200 09/28/20 2235 09/29/20 0501  BP: 120/68  102/64 (!) 98/57  Pulse: (!) 107  94 82  Resp: 12 20 14 16   Temp: (!) 100.7 F (38.2 C)  99.3 F (37.4 C) 97.6 F (36.4 C)  TempSrc: Oral  Oral Oral  SpO2: 95%  95% 96%  Weight:      Height:        Intake/Output Summary (Last 24 hours) at 09/29/2020 0816 Last data filed at 09/28/2020 2100 Gross per 24 hour  Intake 840 ml  Output --  Net 840 ml   Last 3 Weights 09/28/2020 09/27/2020 09/26/2020  Weight (lbs) 186 lb 6.4 oz 187 lb 8 oz 189 lb 9.6 oz  Weight (kg) 84.55 kg 85.049 kg 86.002 kg      Telemetry    Sinus rhythm in the 70-80s - Personally Reviewed  ECG    No new tracings - Personally Reviewed  Physical Exam   GEN: No acute distress.   Neck: No JVD Cardiac: RRR, + murmur on exam  Respiratory: Clear to auscultation bilaterally. GI: Soft, nontender, non-distended  MS: No edema; No deformity. Neuro:  Nonfocal  Psych: Normal affect   Labs    High Sensitivity Troponin:  No results for input(s): TROPONINIHS in the last 720 hours.    Chemistry Recent Labs  Lab 09/26/20 2223 09/27/20 0821 09/28/20 0353 09/29/20 0417  NA 132* 133* 133* 135  K 4.1 4.4 3.8  4.4  CL 98 100 99 102  CO2 26 24 26 25   GLUCOSE 222* 162* 195* 214*  BUN 11 10 10 11   CREATININE 0.78 0.66 0.70 0.72  CALCIUM 8.9 9.0 9.1 9.1  PROT 6.7  --   --   --   ALBUMIN 2.5*  --   --   --   AST 24  --   --   --   ALT 25  --   --   --   ALKPHOS 60  --   --   --   BILITOT 0.4  --   --   --   GFRNONAA >60 >60 >60 >60  ANIONGAP 8 9 8 8      Hematology Recent Labs  Lab 09/27/20 0821 09/28/20 0353 09/29/20 0417  WBC 7.5 11.0* 10.2  RBC 3.40* 3.35* 3.52*  HGB 10.2* 10.2* 10.5*  HCT 31.6* 31.1* 32.6*  MCV 92.9 92.8 92.6  MCH 30.0 30.4 29.8  MCHC 32.3 32.8 32.2  RDW 14.1 13.9 14.0  PLT 405* 390 385    BNPNo results for input(s): BNP, PROBNP in the last 168 hours.   DDimer No results for  input(s): DDIMER in the last 168 hours.   Radiology    No results found.  Cardiac Studies   TEE pending final read October 27, 2020: Informal echo report: Normal biventricular chamber size and function.   Normal aortic bioprosthesis with normal function and no regurgitation or paravalvular leak. No dehiscense or rocking. Prominent post operative changes around valve posteriorly and aortic graft, There is an echo free space posterior and lateral to the prosthesis, which appears contiguous with the native sinus of Valsalva, without flow demonstrated. This was also possibly present on CT from 2021, and may not be an acute finding. If there is a strong clinical suspicion for endocarditis, will consider gated cardiac CTA of the valve and aortic root to visualize this area well in the setting of fevers and bacteremia.    Patient Profile     55 y.o. male with a PMH of bicuspid aortic valve s/p bioprosthetic AVR 2020, HTN, HLD, and DM type 2, who was found to have HACEK organism bacteremia at [facility], transferred to [facility] for TEE. TEE yesterday inconclusive, recommend gated CT to look at valve  -scheduled for today.  Assessment & Plan    HACEK organism bacteremia - cultures grew  aggregatibacter negative for betalamase - TEE yesterday inconclusive - ABX per ID - awaiting CT coronary   Hx of severe AS due to bicuspid aortic valve s/p bioprosthetic AVR/[place] procedure in 2020 - as above   Hypertension - home lisinopril on hold given renal insufficiency - BB restarted - SBP in the 100s - also NPO   Nonobstructive CAD - BB restarted - no chest pain   Hyperlipidemia with LDL goal < 70 02/28/2020: Cholesterol, Total 192; HDL 48; LDL Chol Calc (NIH) 105; Triglycerides 226 - on 40 mg zocor - consider switching to high intensity statin for better LDL control   SVT - brief episode on telemetry lasting 9 sec 09/27/20, symptomatic - no further SVT noted - electrolytes WNL - BB restarted     For questions or updates, please contact [facility] Please consult [email or web address] for contact info under        Signed, Marcelino Duster, PA  09/29/2020, 8:16 AM     Attending Note:   The patient was seen and examined.  Agree with assessment and plan as noted above.  Changes made to the above note as needed.  Patient seen and independently examined with Bettina Gavia, PA .   We discussed all aspects of the encounter. I agree with the assessment and plan as stated above.    Bacteremia / likely bacterial endocarditis:   getting a gated CTA today of the aortic root.  Dr. Jacques Navy will read this afternoon. Continue abx.  Further recs following the CTA  2.  S/p AVR :   the valve looks ok.   There is a question of an abscess in the annulus.   Getting a CTA     I have spent a total of 40 minutes with patient reviewing hospital  notes , telemetry, EKGs, labs and examining patient as well as establishing an assessment and plan that was discussed with the patient.  > 50% of time was spent in direct patient care.    Vesta Mixer, Montez Hageman., MD, [facility] 09/29/2020, 10:23 AM [address]  [address] Office [phone] Pager [phone]

## 2020-09-29 NOTE — Progress Notes (Signed)
PROGRESS NOTE    Ronnie Ruiz  IHK:[id number] DOB: 1966-03-02 DOA: 09/26/2020 PCP: Ronnie Miyamoto, MD    Brief Narrative:  Ronnie Ruiz is a 55 year old male with past medical history significant for essential hypertension, hyperlipidemia, type 2 diabetes mellitus, history of by cuspid aortic valve s/p bioprosthetic AVR 2020 who was found to have HAECK organism bacteremia at [facility] and was transferred to [facility] on 6/7 for TEE and ID consultation.  Patient was transferred from the cardiology service to [facility] on 09/28/2020.   Assessment & Plan:   Principal Problem:   Endocarditis Active Problems:   Hyperlipidemia   Essential hypertension   Alcohol dependency (HCC)   Heart murmur   Bicuspid aortic valve   S/P AVR   Fatigue   Bacteremia   Protein-calorie malnutrition, severe   Aggregatobacter (HAECK) septicemia, POA Patient initially presented to [facility] with fever, weakness and fatigue.  He was originally seen by outpatient cardiology on 09/15/2020 by Dr. Tomie Ruiz with reports of night sweats and fatigue and recommended to undergo a echocardiogram, blood cultures given AVR.  Patient was reported to have positive blood cultures and sent to the ED for further management.  Blood cultures from 09/15/2020 reportedly positive for aggregatibacter (HAECK organism), which is felt to be secondary to his poor dentition.  He was started on ceftriaxone on 09/20/2020.  TTE was unrevealing with LVEF 60 to 65%, mild LAE, mild MR/TR, and AV s/p replacement with mean gradient 14 mmHg, but cannot rule out endocarditis.  He was seen by Dr. Bing Ruiz in consultation given history of AVR, was recommended to transfer to [facility] for TEE and ID consultation.  TEE on 09/29/2018 with no definite evidence of acute endocarditis or valvular abscess, however some findings cannot be clearly distinguished from postoperative change. --Cardiology and infectious disease following, appreciate  assistance --TMAX 100.7 past 24h --Repeat blood cultures 6/7: No growth x2 days --Ceftriaxone 2 g IV every 24 hours --Gated cardiac CTA: Pending today for evaluation of the aortic root --CBC daily, monitor fever curve  Severe AS with bicuspid aortic valve s/p bioprosthetic AVR 2020 --Cardiology following as above, cardiac CTA today  Essential hypertension Patient was noted to have intermittent hypotension at [facility].  On lisinopril and metoprolol at home. --BP 98/57 this morning --Continue metoprolol tartrate 25 mg p.o. twice daily --Continue to hold home lisinopril for now --Continue aspirin and statin --Continue monitor BP closely  Hyperlipidemia --Continue simvastatin 40 mg p.o. daily  Type 2 diabetes mellitus, with hyperglycemia Hemoglobin A1c 9.2 on 09/19/2020, poorly controlled.  On metformin 500 mg p.o. twice daily outpatient. --Hold home metformin while inpatient; would benefit from increase to 1000mg  twice daily on discharge --SSI for coverage --CBGs before every meal/at bedtime  GERD: Continue PPI  Severe protein calorie malnutrition Body mass index is 28.34 kg/m.  Notable moderate muscle depletion and 9% weight loss in 1 month. Nutrition Status: Nutrition Problem: Severe Malnutrition Etiology: acute illness (endocarditis) Signs/Symptoms: moderate muscle depletion, percent weight loss (9% weight loss within 1 month) Percent weight loss: 9 % Interventions: MVI, Other (Comment) (Ensure Max) --Dietitian following, appreciate assistance. --Continue to encourage increased oral intake, supplements    DVT prophylaxis: heparin injection 5,000 Units Start: 09/26/20 2300    Code Status: Full Code Family Communication: None present at bedside, updated patient's spouse 11/26/20 via telephone this afternoon  Disposition Plan:  Level of care: Telemetry Medical Status is: Inpatient  Remains inpatient appropriate because:Ongoing diagnostic testing needed not  appropriate  for outpatient work up, Unsafe d/c plan, IV treatments appropriate due to intensity of illness or inability to take PO, and Inpatient level of care appropriate due to severity of illness  Dispo: The patient is from: Home              Anticipated d/c is to: Home              Patient currently is not medically stable to d/c.   Difficult to place patient No  Consultants:  Cardiology Infectious disease  Procedures:  TTE TEE Gated cardiac CTA  Antimicrobials:  Ceftriaxone   Subjective: Patient seen examined bedside, resting comfortably.  No specific complaints this morning.  T-max overnight 100.7.  Awaiting cardiac CT scan today for further evaluation of his aortic root.  No family present at bedside this morning, updated patient's spouse, via telephone this afternoon.  Denies headache, no dizziness, no chest pain, palpitations, no shortness of breath, no fever/chills/night sweats, no nausea/vomiting/diarrhea, no weakness, no fatigue, no paresthesias.  No acute events overnight per nursing staff.  Objective: Vitals:   09/28/20 2200 09/28/20 2235 09/29/20 0501 09/29/20 0817  BP:  102/64 (!) 98/57 108/65  Pulse:  94 82   Resp: 20 14 16 18   Temp:  99.3 F (37.4 C) 97.6 F (36.4 C) 97.7 F (36.5 C)  TempSrc:  Oral Oral Oral  SpO2:  95% 96% 97%  Weight:      Height:        Intake/Output Summary (Last 24 hours) at 09/29/2020 1225 Last data filed at 09/28/2020 2100 Gross per 24 hour  Intake 740 ml  Output --  Net 740 ml   Filed Weights   09/26/20 2121 09/27/20 0335 09/28/20 0333  Weight: 86 kg 85 kg 84.6 kg    Examination:  General exam: Appears calm and comfortable, poor dentition, moderate muscle wasting and fat depletion noted Respiratory system: Clear to auscultation. Respiratory effort normal.  On room air Cardiovascular system: S1 & S2 heard, RRR. No JVD, murmurs, rubs, gallops or clicks. No pedal edema. Gastrointestinal system: Abdomen is nondistended, soft  and nontender. No organomegaly or masses felt. Normal bowel sounds heard. Central nervous system: Alert and oriented. No focal neurological deficits. Extremities: Symmetric 5 x 5 power. Skin: No rashes, lesions or ulcers Psychiatry: Judgement and insight appear normal. Mood & affect appropriate.     Data Reviewed: I have personally reviewed following labs and imaging studies  CBC: Recent Labs  Lab 09/26/20 2223 09/27/20 0821 09/28/20 0353 09/29/20 0417  WBC 8.2 7.5 11.0* 10.2  NEUTROABS 5.6 4.8 7.4 7.0  HGB 9.6* 10.2* 10.2* 10.5*  HCT 30.1* 31.6* 31.1* 32.6*  MCV 92.9 92.9 92.8 92.6  PLT 452* 405* 390 385   Basic Metabolic Panel: Recent Labs  Lab 09/26/20 2223 09/27/20 0821 09/28/20 0353 09/29/20 0417  NA 132* 133* 133* 135  K 4.1 4.4 3.8 4.4  CL 98 100 99 102  CO2 26 24 26 25   GLUCOSE 222* 162* 195* 214*  BUN 11 10 10 11   CREATININE 0.78 0.66 0.70 0.72  CALCIUM 8.9 9.0 9.1 9.1  MG 1.9  --   --   --    GFR: Estimated Creatinine Clearance: 109.2 mL/min (by C-G formula based on SCr of 0.72 mg/dL). Liver Function Tests: Recent Labs  Lab 09/26/20 2223  AST 24  ALT 25  ALKPHOS 60  BILITOT 0.4  PROT 6.7  ALBUMIN 2.5*   No results for input(s): LIPASE, AMYLASE in the last  168 hours. No results for input(s): AMMONIA in the last 168 hours. Coagulation Profile: Recent Labs  Lab 09/26/20 2223  INR 1.0   Cardiac Enzymes: No results for input(s): CKTOTAL, CKMB, CKMBINDEX, TROPONINI in the last 168 hours. BNP (last 3 results) No results for input(s): PROBNP in the last 8760 hours. HbA1C: No results for input(s): HGBA1C in the last 72 hours. CBG: Recent Labs  Lab 09/28/20 0755 09/28/20 1145 09/28/20 1651 09/28/20 2032 09/29/20 0726  GLUCAP 189* 136* 250* 240* 189*   Lipid Profile: No results for input(s): CHOL, HDL, LDLCALC, TRIG, CHOLHDL, LDLDIRECT in the last 72 hours. Thyroid Function Tests: No results for input(s): TSH, T4TOTAL, FREET4, T3FREE,  THYROIDAB in the last 72 hours. Anemia Panel: No results for input(s): VITAMINB12, FOLATE, FERRITIN, TIBC, IRON, RETICCTPCT in the last 72 hours. Sepsis Labs: No results for input(s): PROCALCITON, LATICACIDVEN in the last 168 hours.  Recent Results (from the past 240 hour(s))  Culture, blood (routine x 2)     Status: None (Preliminary result)   Collection Time: 09/26/20 11:48 PM   Specimen: BLOOD  Result Value Ref Range Status   Specimen Description BLOOD SITE NOT SPECIFIED  Final   Special Requests   Final    BOTTLES DRAWN AEROBIC AND ANAEROBIC Blood Culture results may not be optimal due to an excessive volume of blood received in culture bottles   Culture   Final    NO GROWTH 2 DAYS Performed at [facility], [address] [place], [place] [address]    Report Status PENDING  Incomplete  Culture, blood (routine x 2)     Status: None (Preliminary result)   Collection Time: 09/26/20 11:48 PM   Specimen: BLOOD  Result Value Ref Range Status   Specimen Description BLOOD SITE NOT SPECIFIED  Final   Special Requests   Final    BOTTLES DRAWN AEROBIC AND ANAEROBIC Blood Culture adequate volume   Culture   Final    NO GROWTH 2 DAYS Performed at [facility], [address] [place], [place] [address]    Report Status PENDING  Incomplete  SARS CORONAVIRUS 2 (TAT 6-24 HRS) Nasopharyngeal Nasopharyngeal Swab     Status: None   Collection Time: 09/27/20  3:20 AM   Specimen: Nasopharyngeal Swab  Result Value Ref Range Status   SARS Coronavirus 2 NEGATIVE NEGATIVE Final    Comment: (NOTE) SARS-CoV-2 target nucleic acids are NOT DETECTED.  The SARS-CoV-2 RNA is generally detectable in upper and lower respiratory specimens during the acute phase of infection. Negative results do not preclude SARS-CoV-2 infection, do not rule out co-infections with other pathogens, and should not be used as the sole basis for treatment or other patient management decisions. Negative results  must be combined with clinical observations, patient history, and epidemiological information. The expected result is Negative.  Fact Sheet for Patients: HairSlick.nohttps://www.fda.gov/media/138098/download  Fact Sheet for Healthcare Providers: quierodirigir.comhttps://www.fda.gov/media/138095/download  This test is not yet approved or cleared by the [place] FDA and  has been authorized for detection and/or diagnosis of SARS-CoV-2 by FDA under an Emergency Use Authorization (EUA). This EUA will remain  in effect (meaning this test can be used) for the duration of the COVID-19 declaration under Se ction 564(b)(1) of the Act, 21 U.S.C. section 360bbb-3(b)(1), unless the authorization is terminated or revoked sooner.  Performed at [facility], [address] [place], [place] [address]          Radiology Studies: No results  found.      Scheduled Meds:  aspirin EC  81 mg Oral Daily   heparin  5,000 Units Subcutaneous Q8H   insulin aspart  0-15 Units Subcutaneous TID WC   insulin aspart  0-5 Units Subcutaneous QHS   metoprolol tartrate  25 mg Oral BID   multivitamin with minerals  1 tablet Oral Daily   pantoprazole  40 mg Oral Daily   Ensure Max Protein  11 oz Oral BID   simvastatin  40 mg Oral Daily   Continuous Infusions:  cefTRIAXone (ROCEPHIN)  IV 2 g (09/29/20 0845)     LOS: 3 days    Time spent: 36 minutes spent on chart review, discussion with nursing staff, consultants, updating family and interview/physical exam; more than 50% of that time was spent in counseling and/or coordination of care.    Alvira Philips [place], DO [facility] Available via Epic secure chat 7am-7pm After these hours, please refer to coverage provider listed on [email or web address] 09/29/2020, 12:25 PM

## 2020-09-29 NOTE — Care Management (Signed)
09-29-20 1519 Case Manager spoke with patient regarding possible IV antibiotics for home. Patient provided verbal permission for Case Manager to reach out to [employer]. Liaison Pam is following and she will see if [facility] can accept the patient for [employer]. Weekend Case Manager to follow for additional toc needs.

## 2020-09-29 NOTE — Anesthesia Postprocedure Evaluation (Signed)
Anesthesia Post Note  Patient: Ronnie Ruiz  Procedure(s) Performed: TRANSESOPHAGEAL ECHOCARDIOGRAM (TEE) BUBBLE STUDY     Patient location during evaluation: Endoscopy Anesthesia Type: MAC Level of consciousness: awake and alert Pain management: pain level controlled Vital Signs Assessment: post-procedure vital signs reviewed and stable Respiratory status: spontaneous breathing, nonlabored ventilation, respiratory function stable and patient connected to nasal cannula oxygen Cardiovascular status: stable and blood pressure returned to baseline Postop Assessment: no apparent nausea or vomiting Anesthetic complications: no   No notable events documented.  Last Vitals:  Vitals:   09/29/20 0501 09/29/20 0817  BP: (!) 98/57 108/65  Pulse: 82   Resp: 16 18  Temp: 36.4 C 36.5 C  SpO2: 96% 97%    Last Pain:  Vitals:   09/29/20 0817  TempSrc: Oral  PainSc:                  Chery Giusto COKER

## 2020-09-29 NOTE — Consult Note (Signed)
Reason for Consult:Prosthetic Aortic valve endocarditis Referring Physician: Dr. Antonietta Barcelona is an 55 y.o. male.  HPI: 55 yo man with a history of bicuspid aortic valve, ascending aortic aneurysm, Bentall procedure (BKB in 2020), type 2 diabetes, hypertension, hyperlipidemia, ethanol abuse, and reflux. Was in his usual state of health until late May. First noted problems after having a cortisol shot in his left shoulder. CBG were elevated. Starting feeling fatigued, then night sweats. Was admitted to [facility]. Noted to have AKI, uncontrolled DM. Blood cultures positive for Aggregatibacter, started on Ceftriaxone. Transferred to [facility] on 6/7. TEE showed echolucency posterior to valve. Cardiac CT showed possible perivalvular abscess.   Currently feels well. Denies chest pain, shortness of breath, fever.   Has not seen a dentist since his Bentall.  Past Medical History:  Diagnosis Date   Alcohol dependency (HCC) 06/24/2018   Alcoholism (HCC) 06/24/2018   Aortic stenosis    Ascending aortic aneurysm (HCC) 41 mm as measured by echo 07/02/2018   Atherosclerotic heart disease 06/24/2018   Bicuspid aortic valve 06/25/2018   Critical aortic valve stenosis mean gradient 64 mmHg, 06/25/2018   Diabetes mellitus type 2, controlled (HCC) 06/24/2018   Dyspnea    Essential hypertension 06/24/2018   Fatigue 09/15/2020   GERD (gastroesophageal reflux disease) 06/24/2018   Heart murmur 06/24/2018   Hyperlipidemia 06/24/2018   Night sweats 09/15/2020   Obesity, diabetes, and hypertension syndrome (HCC) 02/28/2020   S/P AVR 09/22/2018   Biological Bentall Procedure using a 25 mm Edwards Magna-Ease pericardial valve and a 28 mm Gelweave Valsalva graft.   Secondary adhesive capsulitis of left shoulder 08/31/2020   Thoracic ascending aortic aneurysm [facility])     Past Surgical History:  Procedure Laterality Date   BENTALL PROCEDURE N/A 09/10/2018   Procedure: BENTALL PROCEDURE USING HEMASHIELD 28, GELWEAVE VALSALVA , AND  MAGNA EASE ;  Surgeon: [name], MD;  Location: [facility] OR;  Service: Open Heart Surgery;  Laterality: N/A;   CHOLECYSTECTOMY     RIGHT HEART CATH AND CORONARY ANGIOGRAPHY N/A 07/07/2018   Procedure: RIGHT HEART CATH AND CORONARY ANGIOGRAPHY;  Surgeon: Lennette Bihari, MD;  Location: [facility] INVASIVE CV LAB;  Service: Cardiovascular;  Laterality: N/A;   TEE WITHOUT CARDIOVERSION N/A 09/10/2018   Procedure: TRANSESOPHAGEAL ECHOCARDIOGRAM (TEE);  Surgeon: [name], MD;  Location: [facility] OR;  Service: Open Heart Surgery;  Laterality: N/A;    Family History  Problem Relation Age of Onset   Lung cancer Mother    Pulmonary disease Mother    Alcoholism Father    Anxiety disorder Father    Hyperlipidemia Father    Lung cancer Father    CAD Father    Renal cancer Father    Pulmonary disease Father     Social History:  reports that he has never smoked. He has quit using smokeless tobacco.  His smokeless tobacco use included snuff. He reports previous alcohol use of about 2.0 standard drinks of alcohol per week. He reports that he does not use drugs.  Allergies: No Known Allergies  Medications: Scheduled:  aspirin EC  81 mg Oral Daily   heparin  5,000 Units Subcutaneous Q8H   insulin aspart  0-15 Units Subcutaneous TID WC   insulin aspart  0-5 Units Subcutaneous QHS   metoprolol tartrate  25 mg Oral BID   multivitamin with minerals  1 tablet Oral Daily   pantoprazole  40 mg Oral Daily   Ensure Max Protein  11 oz Oral  BID   simvastatin  40 mg Oral Daily    Results for orders placed or performed during the hospital encounter of 09/26/20 (from the past 48 hour(s))  Glucose, capillary     Status: Abnormal   Collection Time: 09/27/20  9:24 PM  Result Value Ref Range   Glucose-Capillary 247 (H) 70 - 99 mg/dL    Comment: Glucose reference range applies only to samples taken after fasting for at least 8 hours.  Basic metabolic panel     Status: Abnormal   Collection Time: 09/28/20  3:53 AM   Result Value Ref Range   Sodium 133 (L) 135 - 145 mmol/L   Potassium 3.8 3.5 - 5.1 mmol/L   Chloride 99 98 - 111 mmol/L   CO2 26 22 - 32 mmol/L   Glucose, Bld 195 (H) 70 - 99 mg/dL    Comment: Glucose reference range applies only to samples taken after fasting for at least 8 hours.   BUN 10 6 - 20 mg/dL   Creatinine, Ser 5.36 0.61 - 1.24 mg/dL   Calcium 9.1 8.9 - 14.4 mg/dL   GFR, Estimated >31 >54 mL/min    Comment: (NOTE) Calculated using the CKD-EPI Creatinine Equation (2021)    Anion gap 8 5 - 15    Comment: Performed at [facility], [address] [place], [place] [address]  CBC with Differential/Platelet     Status: Abnormal   Collection Time: 09/28/20  3:53 AM  Result Value Ref Range   WBC 11.0 (H) 4.0 - 10.5 K/uL   RBC 3.35 (L) 4.22 - 5.81 MIL/uL   Hemoglobin 10.2 (L) 13.0 - 17.0 g/dL   HCT 61.9 (L) 50.9 - 32.6 %   MCV 92.8 80.0 - 100.0 fL   MCH 30.4 26.0 - 34.0 pg   MCHC 32.8 30.0 - 36.0 g/dL   RDW 71.2 45.8 - 09.9 %   Platelets 390 150 - 400 K/uL   nRBC 0.0 0.0 - 0.2 %   Neutrophils Relative % 66 %   Neutro Abs 7.4 1.7 - 7.7 K/uL   Lymphocytes Relative 23 %   Lymphs Abs 2.5 0.7 - 4.0 K/uL   Monocytes Relative 7 %   Monocytes Absolute 0.8 0.1 - 1.0 K/uL   Eosinophils Relative 2 %   Eosinophils Absolute 0.2 0.0 - 0.5 K/uL   Basophils Relative 1 %   Basophils Absolute 0.1 0.0 - 0.1 K/uL   Immature Granulocytes 1 %   Abs Immature Granulocytes 0.07 0.00 - 0.07 K/uL    Comment: Performed at [facility], [address] [place], [place] [address]  Glucose, capillary     Status: Abnormal   Collection Time: 09/28/20  7:55 AM  Result Value Ref Range   Glucose-Capillary 189 (H) 70 - 99 mg/dL    Comment: Glucose reference range applies only to samples taken after fasting for at least 8 hours.  Glucose, capillary     Status: Abnormal   Collection Time: 09/28/20 11:45 AM  Result Value Ref Range   Glucose-Capillary 136 (H) 70 - 99 mg/dL    Comment:  Glucose reference range applies only to samples taken after fasting for at least 8 hours.  Glucose, capillary     Status: Abnormal   Collection Time: 09/28/20  4:51 PM  Result Value Ref Range   Glucose-Capillary 250 (H) 70 - 99 mg/dL    Comment: Glucose reference range applies only to samples taken after fasting for at least 8  hours.  Glucose, capillary     Status: Abnormal   Collection Time: 09/28/20  8:32 PM  Result Value Ref Range   Glucose-Capillary 240 (H) 70 - 99 mg/dL    Comment: Glucose reference range applies only to samples taken after fasting for at least 8 hours.  Basic metabolic panel     Status: Abnormal   Collection Time: 09/29/20  4:17 AM  Result Value Ref Range   Sodium 135 135 - 145 mmol/L   Potassium 4.4 3.5 - 5.1 mmol/L   Chloride 102 98 - 111 mmol/L   CO2 25 22 - 32 mmol/L   Glucose, Bld 214 (H) 70 - 99 mg/dL    Comment: Glucose reference range applies only to samples taken after fasting for at least 8 hours.   BUN 11 6 - 20 mg/dL   Creatinine, Ser 7.53 0.61 - 1.24 mg/dL   Calcium 9.1 8.9 - 00.5 mg/dL   GFR, Estimated >11 >02 mL/min    Comment: (NOTE) Calculated using the CKD-EPI Creatinine Equation (2021)    Anion gap 8 5 - 15    Comment: Performed at [facility], [address] [place], [place] [address]  CBC with Differential/Platelet     Status: Abnormal   Collection Time: 09/29/20  4:17 AM  Result Value Ref Range   WBC 10.2 4.0 - 10.5 K/uL   RBC 3.52 (L) 4.22 - 5.81 MIL/uL   Hemoglobin 10.5 (L) 13.0 - 17.0 g/dL   HCT 56.7 (L) 01.4 - 10.3 %   MCV 92.6 80.0 - 100.0 fL   MCH 29.8 26.0 - 34.0 pg   MCHC 32.2 30.0 - 36.0 g/dL   RDW 01.3 14.3 - 88.8 %   Platelets 385 150 - 400 K/uL   nRBC 0.0 0.0 - 0.2 %   Neutrophils Relative % 68 %   Neutro Abs 7.0 1.7 - 7.7 K/uL   Lymphocytes Relative 21 %   Lymphs Abs 2.2 0.7 - 4.0 K/uL   Monocytes Relative 8 %   Monocytes Absolute 0.8 0.1 - 1.0 K/uL   Eosinophils Relative 1 %   Eosinophils Absolute 0.1  0.0 - 0.5 K/uL   Basophils Relative 1 %   Basophils Absolute 0.1 0.0 - 0.1 K/uL   Immature Granulocytes 1 %   Abs Immature Granulocytes 0.06 0.00 - 0.07 K/uL    Comment: Performed at [facility], [address], [place], [place] [address]  Glucose, capillary     Status: Abnormal   Collection Time: 09/29/20  7:26 AM  Result Value Ref Range   Glucose-Capillary 189 (H) 70 - 99 mg/dL    Comment: Glucose reference range applies only to samples taken after fasting for at least 8 hours.  Glucose, capillary     Status: Abnormal   Collection Time: 09/29/20 12:37 PM  Result Value Ref Range   Glucose-Capillary 151 (H) 70 - 99 mg/dL    Comment: Glucose reference range applies only to samples taken after fasting for at least 8 hours.  Glucose, capillary     Status: Abnormal   Collection Time: 09/29/20  5:02 PM  Result Value Ref Range   Glucose-Capillary 227 (H) 70 - 99 mg/dL    Comment: Glucose reference range applies only to samples taken after fasting for at least 8 hours.    CT CORONARY MORPH W/CTA COR W/SCORE W/CA W/CM &/OR WO/CM  Result Date: 09/29/2020 EXAM: OVER-READ INTERPRETATION  CT CHEST The following report is an over-read performed  by radiologist Dr. Corlis Leak of [facility] on 09/29/2020. This over-read does not include interpretation of cardiac or coronary anatomy or pathology. The coronary calcium score/coronary CTA interpretation by the cardiologist is dictated separately. COMPARISON:  10/20/2019 FINDINGS: Vascular: Heart size normal. Fair contrast opacification of pulmonary artery branches; the exam was not optimized for detection of pulmonary emboli. Coronary calcifications. Previous AVR. Stable ascending aortic tube graft repair. Good contrast opacification of the thoracic aorta without dissection, aneurysm, or stenosis. Common origin of common carotid arteries with some eccentric partially calcified plaque, no high-grade stenosis. Left subclavian artery widely  patent. Aberrant origin of the right subclavian artery which takes a retroesophageal course. Mediastinum/Nodes: No mass or adenopathy. Lungs/Pleura: No pleural effusion. No pneumothorax. Lungs are clear. Upper Abdomen: No acute findings. Musculoskeletal: Sternotomy wires. Anterior vertebral endplate spurring at multiple levels in the lower thoracic spine. No fracture or worrisome bone lesion. IMPRESSION: 1. No acute findings. 2. Stable changes of Bentall repair. 3. Coronary and Aortic Atherosclerosis (ICD10-170.0). 4. Aberrant origin of right subclavian artery, an anatomic variant. Electronically Signed   By: Corlis Leak M.D.   On: 09/29/2020 15:14   ECHO TEE  Result Date: 09/29/2020    TRANSESOPHOGEAL ECHO REPORT   Patient Name:   Ronnie Ruiz Date of Exam: 09/28/2020 Medical Rec #:  [id number]      Height:       68.0 in Accession #:    [id number]     Weight:       186.4 lb Date of Birth:  04/26/1965      BSA:          1.983 m Patient Age:    55 years       BP:           101/66 mmHg Patient Gender: M              HR:           82 bpm. Exam Location:  Inpatient Procedure: 2D Echo, Cardiac Doppler and Color Doppler                               MODIFIED REPORT: This report was modified by Weston Brass MD on 09/29/2020 due to revision.  Indications:     Bacteremia  History:         Patient has prior history of Echocardiogram examinations.                  Biological Bentall Procedure using a 25 mm Edwards Magna-Ease                  pericardial valve and a 28 mm Gelweave Valsalva graft.                  Aortic Valve: 25 mm Magna-Ease pericardial valve is present in                  the aortic position. Procedure Date: 09/10/2018.  Sonographer:     Roosvelt Maser RDCS Referring Phys:  [id number] Larita Fife [facility] Diagnosing Phys: Weston Brass MD PROCEDURE: After discussion of the risks and benefits of a TEE, an informed consent was obtained from the patient. TEE procedure time was 34 minutes. The transesophogeal probe  was passed without difficulty through the esophogus of the patient. Local oropharyngeal anesthetic was provided with Cetacaine. Sedation performed by different physician. The patient was monitored while under  deep sedation. Image quality was good. The patient's vital signs; including heart rate, blood pressure, and oxygen saturation; remained stable throughout the procedure. The patient developed no complications during the procedure. IMPRESSIONS  1. There is a valved conduit present in the aortic position and aortic root. There is abnormal thickening posterior to the valved conduit beginning at the level of the valve and extending to the sinus of Valsalva, extending along the aorto-mitral continuity. There is an area of echolucency posterior to the prosthetic valve, concerning for perivalvular abscess. There is no flow into this region to suggest a fistulous track. The left coronary artery courses into this space, no definite communication. . The aortic valve has been repaired/replaced. Aortic valve regurgitation is not visualized. EOA 2.7 cm2, iEOA 1.36 cm2/m2, AT 82 msec, DVI 0.72. Normal function of aortic valve prosthesis. There is a 25 mm Magna-Ease pericardial valve present in the aortic position. Procedure Date: 09/10/2018. Echo findings are concerning for abscess of the aortic prosthesis. Recommend gated CTA Heart to further evaluate findings.  2. Left ventricular ejection fraction, by estimation, is 60%. The left ventricle has normal function.  3. Right ventricular systolic function is normal. The right ventricular size is normal.  4. Left atrial size was mildly dilated. No left atrial/left atrial appendage thrombus was detected.  5. The mitral valve is normal in structure. Trivial mitral valve regurgitation.  6. S/p 28 mm Gelweave Valsalva graft (Bentall procedure). Aorta is normal caliber where visualized distal to the graft. . Aortic root/ascending aorta has been repaired/replaced.  7. Evidence of atrial  level shunting detected by color flow Doppler. Agitated saline contrast bubble study was positive with shunting observed within 3-6 cardiac cycles suggestive of interatrial shunt. There is a small patent foramen ovale with bidirectional shunting across atrial septum.  8. No valvular vegetations noted, however there is concern for perivavular abscess around aortic valve prosthesis as noted in Impression #1. Conclusion(s)/Recommendation(s): Discussed findings with cardiology consult service, will arrange for CTA Heart gated study. FINDINGS  Left Ventricle: Left ventricular ejection fraction, by estimation, is 60%. The left ventricle has normal function. The left ventricular internal cavity size was normal in size. Right Ventricle: The right ventricular size is normal. No increase in right ventricular wall thickness. Right ventricular systolic function is normal. Left Atrium: Left atrial size was mildly dilated. No left atrial/left atrial appendage thrombus was detected. Right Atrium: Right atrial size was normal in size. Pericardium: There is no evidence of pericardial effusion. Mitral Valve: The mitral valve is normal in structure. Trivial mitral valve regurgitation. There is no evidence of mitral valve vegetation. Tricuspid Valve: The tricuspid valve is normal in structure. Tricuspid valve regurgitation is trivial. There is no evidence of tricuspid valve vegetation. Aortic Valve: There is a valved conduit present in the aortic position and aortic root. There is abnormal thickening posterior to the valved conduit beginning at the level of the valve and extending to the sinus of Valsalva, extending along the aorto-mitral continuity. There is an area of echolucency posterior to the prosthetic valve, concerning for perivalvular abscess. There is no flow into this region to suggest a fistulous track. The left coronary artery courses into this space, no definite  communication. The aortic valve has been repaired/replaced.  Aortic valve regurgitation is not visualized. EOA 2.7 cm2, iEOA 1.36 cm2/m2, AT 82 msec, DVI 0.72. Normal function of aortic valve prosthesis. Aortic valve mean gradient measures 10.0 mmHg. Aortic valve peak gradient measures 16.1 mmHg. Aortic valve area, by VTI measures  2.74 cm. There is a 25 mm Magna-Ease pericardial valve present in the aortic position. Procedure Date: 09/10/2018. Pulmonic Valve: The pulmonic valve was normal in structure. Pulmonic valve regurgitation is trivial. There is no evidence of pulmonic valve vegetation. Aorta: S/p 28 mm Gelweave Valsalva graft (Bentall procedure). Aorta is normal caliber where visualized distal to the graft. The aortic root/ascending aorta has been repaired/replaced. There is minimal (Grade I) atheroma plaque involving the transverse aorta. IAS/Shunts: Evidence of atrial level shunting detected by color flow Doppler. Agitated saline contrast was given intravenously to evaluate for intracardiac shunting. Agitated saline contrast bubble study was positive with shunting observed within 3-6 cardiac cycles suggestive of interatrial shunt. A small patent foramen ovale is detected with bidirectional shunting across atrial septum.  LEFT VENTRICLE PLAX 2D LVOT diam:     2.20 cm LV SV:         92 LV SV Index:   46 LVOT Area:     3.80 cm  AORTIC VALVE AV Area (Vmax):    2.86 cm AV Area (Vmean):   2.83 cm AV Area (VTI):     2.74 cm AV Vmax:           200.50 cm/s AV Vmean:          149.000 cm/s AV VTI:            0.336 m AV Peak Grad:      16.1 mmHg AV Mean Grad:      10.0 mmHg LVOT Vmax:         151.00 cm/s LVOT Vmean:        111.000 cm/s LVOT VTI:          0.242 m LVOT/AV VTI ratio: 0.72  AORTA Ao Asc diam: 3.00 cm  SHUNTS Systemic VTI:  0.24 m Systemic Diam: 2.20 cm Weston Brass MD Electronically signed by Weston Brass MD Signature Date/Time: 09/29/2020/5:57:21 PM    Final (Updated)     Review of Systems  Constitutional:  Positive for chills, fatigue and fever.  Negative for unexpected weight change.  HENT:  Positive for dental problem. Negative for trouble swallowing and voice change.   Respiratory:  Negative for chest tightness and shortness of breath.   Cardiovascular:  Negative for chest pain and palpitations.  Genitourinary:  Negative for difficulty urinating and dysuria.  Musculoskeletal:  Positive for arthralgias and joint swelling.  Neurological:  Negative for syncope and weakness.  Blood pressure 102/60, pulse 82, temperature 99.3 F (37.4 C), temperature source Oral, resp. rate 12, height  (1.727 m), weight 84.6 kg, SpO2 96 %. Physical Exam Vitals reviewed.  Constitutional:      General: He is not in acute distress.    Appearance: Normal appearance.  HENT:     Head: Normocephalic and atraumatic.     Comments: Poor dentition Eyes:     General: No scleral icterus.    Extraocular Movements: Extraocular movements intact.  Cardiovascular:     Rate and Rhythm: Normal rate and regular rhythm.     Heart sounds: Murmur (2-3/6 systolic murmur) heard.  Pulmonary:     Effort: No respiratory distress.     Breath sounds: Normal breath sounds. No wheezing or rales.  Abdominal:     General: There is no distension.     Palpations: Abdomen is soft.  Musculoskeletal:     Cervical back: Neck supple.  Skin:    General: Skin is warm and dry.  Neurological:     General: No focal  deficit present.     Mental Status: He is alert and oriented to person, place, and time.     Cranial Nerves: No cranial nerve deficit.     Motor: No weakness.    Assessment/Plan: 55 yo man with a history of bicuspid aortic valve, ascending aortic aneurysm, Bentall procedure (BKB in 2020), type 2 diabetes, hypertension, hyperlipidemia, ethanol abuse, and reflux.   Presented with sepsis. Blood cultures positive for Aggregatibacter ([facility]ACEK organism). ID feels source is likely dental. He does have multiple missing teeth from previous extractions prior to original  surgery. Also has some teeth that are bothering him now.   On Ceftriaxone  Needs dental consult  If in fact a perivalvular abscess will needs redo surgery to replace prosthetic material with a homograft root. High risk surgery with guarded prognosis.  Loreli SlotSteven C Kemyah Buser 09/29/2020, 6:31 PM

## 2020-09-30 ENCOUNTER — Inpatient Hospital Stay (HOSPITAL_COMMUNITY): Payer: Managed Care, Other (non HMO)

## 2020-09-30 DIAGNOSIS — I330 Acute and subacute infective endocarditis: Secondary | ICD-10-CM

## 2020-09-30 DIAGNOSIS — F10229 Alcohol dependence with intoxication, unspecified: Secondary | ICD-10-CM

## 2020-09-30 DIAGNOSIS — B3321 Viral endocarditis: Secondary | ICD-10-CM

## 2020-09-30 DIAGNOSIS — Z952 Presence of prosthetic heart valve: Secondary | ICD-10-CM

## 2020-09-30 DIAGNOSIS — Q231 Congenital insufficiency of aortic valve: Secondary | ICD-10-CM

## 2020-09-30 DIAGNOSIS — K029 Dental caries, unspecified: Secondary | ICD-10-CM

## 2020-09-30 DIAGNOSIS — E7800 Pure hypercholesterolemia, unspecified: Secondary | ICD-10-CM

## 2020-09-30 DIAGNOSIS — T826XXA Infection and inflammatory reaction due to cardiac valve prosthesis, initial encounter: Principal | ICD-10-CM

## 2020-09-30 LAB — CBC WITH DIFFERENTIAL/PLATELET
Abs Immature Granulocytes: 0.03 10*3/uL (ref 0.00–0.07)
Basophils Absolute: 0.1 10*3/uL (ref 0.0–0.1)
Basophils Relative: 1 %
Eosinophils Absolute: 0.1 10*3/uL (ref 0.0–0.5)
Eosinophils Relative: 2 %
HCT: 29.9 % — ABNORMAL LOW (ref 39.0–52.0)
Hemoglobin: 9.6 g/dL — ABNORMAL LOW (ref 13.0–17.0)
Immature Granulocytes: 0 %
Lymphocytes Relative: 20 %
Lymphs Abs: 1.6 10*3/uL (ref 0.7–4.0)
MCH: 29.5 pg (ref 26.0–34.0)
MCHC: 32.1 g/dL (ref 30.0–36.0)
MCV: 92 fL (ref 80.0–100.0)
Monocytes Absolute: 0.8 10*3/uL (ref 0.1–1.0)
Monocytes Relative: 9 %
Neutro Abs: 5.6 10*3/uL (ref 1.7–7.7)
Neutrophils Relative %: 68 %
Platelets: 398 10*3/uL (ref 150–400)
RBC: 3.25 MIL/uL — ABNORMAL LOW (ref 4.22–5.81)
RDW: 13.9 % (ref 11.5–15.5)
WBC: 8.2 10*3/uL (ref 4.0–10.5)
nRBC: 0 % (ref 0.0–0.2)

## 2020-09-30 LAB — GLUCOSE, CAPILLARY
Glucose-Capillary: 222 mg/dL — ABNORMAL HIGH (ref 70–99)
Glucose-Capillary: 264 mg/dL — ABNORMAL HIGH (ref 70–99)
Glucose-Capillary: 284 mg/dL — ABNORMAL HIGH (ref 70–99)
Glucose-Capillary: 236 mg/dL — ABNORMAL HIGH (ref 70–99)

## 2020-09-30 LAB — BASIC METABOLIC PANEL
Anion gap: 8 (ref 5–15)
BUN: 8 mg/dL (ref 6–20)
CO2: 23 mmol/L (ref 22–32)
Calcium: 8.7 mg/dL — ABNORMAL LOW (ref 8.9–10.3)
Chloride: 101 mmol/L (ref 98–111)
Creatinine, Ser: 0.63 mg/dL (ref 0.61–1.24)
GFR, Estimated: >60 mL/min (ref >60)
Glucose, Bld: 238 mg/dL — ABNORMAL HIGH (ref 70–99)
Potassium: 4.1 mmol/L (ref 3.5–5.1)
Sodium: 132 mmol/L — ABNORMAL LOW (ref 135–145)

## 2020-09-30 MED ORDER — DOCUSATE SODIUM 100 MG PO CAPS
100.0000 mg | ORAL_CAPSULE | Freq: Two times a day (BID) | ORAL | Status: DC
  Administered 2020-09-30 – 2020-10-03 (×8): 100 mg via ORAL
  Filled 2020-09-30 (×8): qty 1

## 2020-09-30 MED ORDER — ATORVASTATIN CALCIUM 80 MG PO TABS
80.0000 mg | ORAL_TABLET | Freq: Every day | ORAL | Status: DC
  Administered 2020-09-30 – 2020-10-10 (×9): 80 mg via ORAL
  Filled 2020-09-30 (×9): qty 1

## 2020-09-30 MED ORDER — INSULIN GLARGINE 100 UNIT/ML ~~LOC~~ SOLN
10.0000 [IU] | Freq: Every day | SUBCUTANEOUS | Status: DC
  Administered 2020-09-30: 10 [IU] via SUBCUTANEOUS
  Filled 2020-09-30 (×2): qty 0.1

## 2020-09-30 MED ORDER — BISACODYL 5 MG PO TBEC
5.0000 mg | DELAYED_RELEASE_TABLET | Freq: Every day | ORAL | Status: DC | PRN
  Administered 2020-09-30 – 2020-10-03 (×2): 5 mg via ORAL
  Filled 2020-09-30 (×2): qty 1

## 2020-09-30 NOTE — Progress Notes (Signed)
Subjective: No new complaints   Antibiotics:  Anti-infectives (From admission, onward)    Start     Dose/Rate Route Frequency Ordered Stop   09/27/20 0900  cefTRIAXone (ROCEPHIN) injection 2 g  Status:  Discontinued        2 g Intramuscular Every 24 hours 09/26/20 2224 09/26/20 2229   09/27/20 0900  cefTRIAXone (ROCEPHIN) 2 g in sodium chloride 0.9 % 100 mL IVPB        2 g 200 mL/hr over 30 Minutes Intravenous Every 24 hours 09/26/20 2230         Medications: Scheduled Meds:  aspirin EC  81 mg Oral Daily   atorvastatin  80 mg Oral Daily   docusate sodium  100 mg Oral BID   heparin  5,000 Units Subcutaneous Q8H   insulin aspart  0-15 Units Subcutaneous TID WC   insulin aspart  0-5 Units Subcutaneous QHS   insulin glargine  10 Units Subcutaneous Daily   metoprolol tartrate  25 mg Oral BID   multivitamin with minerals  1 tablet Oral Daily   pantoprazole  40 mg Oral Daily   Ensure Max Protein  11 oz Oral BID   Continuous Infusions:  cefTRIAXone (ROCEPHIN)  IV 2 g (09/30/20 0854)   PRN Meds:.acetaminophen, bisacodyl, nitroGLYCERIN, ondansetron (ZOFRAN) IV, zolpidem    Objective: Weight change:   Intake/Output Summary (Last 24 hours) at 09/30/2020 1436 Last data filed at 09/30/2020 1017 Gross per 24 hour  Intake 360 ml  Output --  Net 360 ml   Blood pressure 101/69, pulse 72, temperature 98.8 F (37.1 C), temperature source Oral, resp. rate 17, height 5\' 8"  (1.727 m), weight 84.6 kg, SpO2 97 %. Temp:  [97.6 F (36.4 C)-99.3 F (37.4 C)] 98.8 F (37.1 C) (06/11 1223) Pulse Rate:  [72-92] 72 (06/11 1223) Resp:  [12-20] 17 (06/11 1223) BP: (101-114)/(60-69) 101/69 (06/11 1223) SpO2:  [93 %-97 %] 97 % (06/11 1223)  Physical Exam: Physical Exam Constitutional:      Appearance: He is well-developed.  HENT:     Head: Normocephalic and atraumatic.     Mouth/Throat:     Dentition: Abnormal dentition. Dental caries present.     Tongue: No lesions. Tongue  does not deviate from midline.     Pharynx: No pharyngeal swelling, oropharyngeal exudate or posterior oropharyngeal erythema.  Eyes:     Conjunctiva/sclera: Conjunctivae normal.  Cardiovascular:     Rate and Rhythm: Normal rate and regular rhythm.     Comments: Click of mechanical valve heard Pulmonary:     Effort: Pulmonary effort is normal. No respiratory distress.     Breath sounds: No wheezing.  Abdominal:     General: There is no distension.     Palpations: Abdomen is soft.  Musculoskeletal:        General: Normal range of motion.     Cervical back: Normal range of motion and neck supple.  Skin:    General: Skin is warm and dry.     Findings: No erythema or rash.  Neurological:     General: No focal deficit present.     Mental Status: He is alert and oriented to person, place, and time.  Psychiatric:        Attention and Perception: Attention normal.        Mood and Affect: Mood is anxious.        Behavior: Behavior normal.        Thought Content:  Thought content normal.        Cognition and Memory: Cognition and memory normal.        Judgment: Judgment normal.     CBC:    BMET Recent Labs    09/29/20 0417 09/30/20 0220  NA 135 132*  K 4.4 4.1  CL 102 101  CO2 25 23  GLUCOSE 214* 238*  BUN 11 8  CREATININE 0.72 0.63  CALCIUM 9.1 8.7*     Liver Panel  No results for input(s): PROT, ALBUMIN, AST, ALT, ALKPHOS, BILITOT, BILIDIR, IBILI in the last 72 hours.     Sedimentation Rate No results for input(s): ESRSEDRATE in the last 72 hours. C-Reactive Protein No results for input(s): CRP in the last 72 hours.  Micro Results: Recent Results (from the past 720 hour(s))  Culture, blood (routine x 2)     Status: None (Preliminary result)   Collection Time: 09/26/20 11:48 PM   Specimen: BLOOD  Result Value Ref Range Status   Specimen Description BLOOD SITE NOT SPECIFIED  Final   Special Requests   Final    BOTTLES DRAWN AEROBIC AND ANAEROBIC Blood  Culture results may not be optimal due to an excessive volume of blood received in culture bottles   Culture   Final    NO GROWTH 3 DAYS Performed at [facility], [address], [place], [place] [address]    Report Status PENDING  Incomplete  Culture, blood (routine x 2)     Status: None (Preliminary result)   Collection Time: 09/26/20 11:48 PM   Specimen: BLOOD  Result Value Ref Range Status   Specimen Description BLOOD SITE NOT SPECIFIED  Final   Special Requests   Final    BOTTLES DRAWN AEROBIC AND ANAEROBIC Blood Culture adequate volume   Culture   Final    NO GROWTH 3 DAYS Performed at [facility], [address] [place], [place] [address]    Report Status PENDING  Incomplete  SARS CORONAVIRUS 2 (TAT 6-24 HRS) Nasopharyngeal Nasopharyngeal Swab     Status: None   Collection Time: 09/27/20  3:20 AM   Specimen: Nasopharyngeal Swab  Result Value Ref Range Status   SARS Coronavirus 2 NEGATIVE NEGATIVE Final    Comment: (NOTE) SARS-CoV-2 target nucleic acids are NOT DETECTED.  The SARS-CoV-2 RNA is generally detectable in upper and lower respiratory specimens during the acute phase of infection. Negative results do not preclude SARS-CoV-2 infection, do not rule out co-infections with other pathogens, and should not be used as the sole basis for treatment or other patient management decisions. Negative results must be combined with clinical observations, patient history, and epidemiological information. The expected result is Negative.  Fact Sheet for Patients: HairSlick.no  Fact Sheet for Healthcare Providers: quierodirigir.com  This test is not yet approved or cleared by the [place] FDA and  has been authorized for detection and/or diagnosis of SARS-CoV-2 by FDA under an Emergency Use Authorization (EUA). This EUA will remain  in effect (meaning this test can be used) for the duration of  the COVID-19 declaration under Se ction 564(b)(1) of the Act, 21 U.S.C. section 360bbb-3(b)(1), unless the authorization is terminated or revoked sooner.  Performed at [facility], [address] [place], [place] [address]     Studies/Results: [place] [facility]  Result Date: 09/30/2020 CLINICAL DATA:  Dental caries.  No pain at this time. EXAM: ORTHOPANTOGRAM/PANORAMIC COMPARISON:  None. FINDINGS: Multiple lower teeth and several upper teeth  are absent. No evidence of fracture. No focal lytic or sclerotic lesion. IMPRESSION: No acute findings. Electronically Signed   By: Elberta Fortisaniel  Boyle M.D.   On: 09/30/2020 14:08   CT CORONARY MORPH W/CTA COR W/SCORE W/CA W/CM &/OR WO/CM  Addendum Date: 09/29/2020   ADDENDUM REPORT: 09/29/2020 18:43 CLINICAL DATA:  Bacteremia, concern for perivalvular abscess. 55 yo male with bacteremia and fevers, with history of bicuspid aortic valve stenosis and ascending aortic aneurysm, s/p Biological Bentall Procedure using a 25 mm Edwards Magna-Ease pericardial valve and a 28 mm Gelweave Valsalva graft, as well as replacement of the ascending aorta (hemi-arch) using a 28 mm Hemashield graft on 09/10/2018 with Dr. Laneta SimmersBartle. COMPARISON INTRAOPERATIVE TEE 09/10/2018, CTA CHEST AORTA 10/20/19, TRANSTHORACIC ECHO 04/03/20. EXAM: Cardiac ECG gated CT angiogram TECHNIQUE: The patient was scanned on a Siemens Force 192 slice scanner. A 120 kV retrospective scan was triggered in the descending thoracic aorta at 111 HU's. Gantry rotation speed was 270 msecs and collimation was .9 mm. The 3D data set was reconstructed in 5% intervals of the R-R cycle. Systolic and diastolic phases were analyzed on a dedicated work station using MPR, MIP and VRT modes. The patient received 100mL OMNIPAQUE IOHEXOL 350 MG/ML SOLN of contrast. Nitroglycerin could not be administered due to hypotension. FINDINGS: AORTIC VALVE: There is a bioprosthesis in the aortic position, as part of a valved conduit.  Posterior to the valve, there is a 3 x 3 x 3 cm area of mixed attenuation, with low density centrally and relative peripheral enhancement, consistent with perivalvular abscess. This finding was not present on CTA chest performed 10/20/19, and is not seen on immediately post-operative TEE. There is no contrast opacification of this space, and no evidence of peri-graft pseudoaneurysm or fistula. The prosthetic valve leaflets are normal with normal excursion. AORTA: The sinus of Valsalva and ascending aorta have been replaced. There is thickening and fluid density consistent with extension of above noted periprosthetic abscess. The ascending aorta graft otherwise appears stable. The ascending aorta distal to the graft is normal caliber, 33 mm at distal ascending aorta just before the common origin of common carotid arteries. CORONARY ARTERIES: Study was not performed to evaluate coronary arteries. Nitroglycerin was not given due to hypotension. The left main coronary artery is encircled by mixed attenuation mass consistent with abscess. No communication with coronary artery seen. LM is free of disease and is not compressed by abscess. Left dominant circulation. Calcifications in LAD and L circumflex. Proximal LAD and proximal L circumflex do not appear to have severe stenosis. RCA is small and appears patent. OTHER: Mild dilation of main pulmonary artery, 30 mm. No left atrial appendage thrombus. Normal pulmonary vein drainage into the left atrium. IMPRESSION: 1. Findings consistent with perivalvular abscess posterior to the aortic valve bioprosthesis. The left main coronary artery is patent and traverses this area of mixed attenuation without compression of the left main coronary artery. Findings communicated to the cardiology consult service. Recommend cardiac surgery consult. Electronically Signed   By: Weston BrassGayatri  Acharya   On: 09/29/2020 18:43   Result Date: 09/29/2020 EXAM: OVER-READ INTERPRETATION  CT CHEST The  following report is an over-read performed by radiologist Dr. Corlis [facility]ak [facility] on 09/29/2020. This over-read does not include interpretation of cardiac or coronary anatomy or pathology. The coronary calcium score/coronary CTA interpretation by the cardiologist is dictated separately. COMPARISON:  10/20/2019 FINDINGS: Vascular: Heart size normal. Fair contrast opacification of pulmonary artery branches; the exam was not optimized  for detection of pulmonary emboli. Coronary calcifications. Previous AVR. Stable ascending aortic tube graft repair. Good contrast opacification of the thoracic aorta without dissection, aneurysm, or stenosis. Common origin of common carotid arteries with some eccentric partially calcified plaque, no high-grade stenosis. Left subclavian artery widely patent. Aberrant origin of the right subclavian artery which takes a retroesophageal course. Mediastinum/Nodes: No mass or adenopathy. Lungs/Pleura: No pleural effusion. No pneumothorax. Lungs are clear. Upper Abdomen: No acute findings. Musculoskeletal: Sternotomy wires. Anterior vertebral endplate spurring at multiple levels in the lower thoracic spine. No fracture or worrisome bone lesion. IMPRESSION: 1. No acute findings. 2. Stable changes of Bentall repair. 3. Coronary and Aortic Atherosclerosis (ICD10-170.0). 4. Aberrant origin of right subclavian artery, an anatomic variant. Electronically Signed: By: Corlis Leak M.D. On: 09/29/2020 15:14      Assessment/Plan:  INTERVAL HISTORY: Paravalvular abscess found   Principal Problem:   Endocarditis Active Problems:   Hyperlipidemia   Essential hypertension   Alcohol dependency (HCC)   Heart murmur   Bicuspid aortic valve   S/P AVR   Fatigue   Bacteremia   Protein-calorie malnutrition, severe    Ronnie Ruiz is a 55 y.o. male with history of bicuspid aortic valve and ascending aortic aneurysm status post Bentall procedure in 2020 with diabetes mellitus  alcohol abuse and poor dentition now admitted to [facility] with Aggregatibacter bacteremia and found to have perivalvular abscess based on transesophageal echocardiogram and CT angiogram.  Dr. Dorris Fetch is seen the patient from cardiothoracic surgery. He will need redo surgery to treat up [facility] prosthetic valvular abscess with placement of prosthetic material and homograft root.  Dental surgery is going to see the patient today,  Would support removing any and all teeth that could be a source of his infection.  The patient is understandably nervous about his condition but seems clinically to be stable at present.  I spent more than 35 minutes with the patient including greater than 50% of time in face to face counseling of the patient personally reviewing radiographs, along with pertinent laboratory microbiological data review of medical records and in coordination of his care.    LOS: 4 days   Acey Lav 09/30/2020, 2:36 PM

## 2020-09-30 NOTE — Progress Notes (Signed)
PROGRESS NOTE    [name]  MVE:[id number] DOB: March 20, 1966 DOA: 09/26/2020 PCP: [name], MD    Brief Narrative:  Ronnie Ruiz is a 55 year old male with past medical history significant for essential hypertension, hyperlipidemia, type 2 diabetes mellitus, history of by cuspid aortic valve s/p bioprosthetic AVR 2020 who was found to have HAECK organism bacteremia at [facility] and was transferred to [facility] on 6/7 for TEE and ID consultation.  Patient was transferred from the cardiology service to [facility] on 09/28/2020.   Assessment & Plan:   Principal Problem:   Endocarditis Active Problems:   Hyperlipidemia   Essential hypertension   Alcohol dependency (HCC)   Heart murmur   Bicuspid aortic valve   S/P AVR   Fatigue   Bacteremia   Protein-calorie malnutrition, severe   Aggregatobacter (HAECK) septicemia, POA Patient initially presented to [facility] with fever, weakness and fatigue.  He was originally seen by outpatient cardiology on 09/15/2020 by Dr. Tomie China with reports of night sweats and fatigue and recommended to undergo a echocardiogram, blood cultures given AVR.  Patient was reported to have positive blood cultures and sent to the ED for further management.  Blood cultures from 09/15/2020 reportedly positive for aggregatibacter (HAECK organism), which is felt to be secondary to his poor dentition.  He was started on ceftriaxone on 09/20/2020.  TTE was unrevealing with LVEF 60 to 65%, mild LAE, mild MR/TR, and AV s/p replacement with mean gradient 14 mmHg, but cannot rule out endocarditis.  He was seen by Dr. Bing Matter in consultation given history of AVR, was recommended to transfer to [facility] for TEE and ID consultation.  TEE on 09/29/2018 with no definite evidence of acute endocarditis or valvular abscess, however some findings cannot be clearly distinguished from postoperative change.  CT coronary 09/29/2020 with findings consistent with  perivalvular abscess posterior to the aortic valve bioprosthesis. --Cardiology and infectious disease following, appreciate assistance --TMAX 99.3 past 24h --Repeat blood cultures 6/7: No growth x3 days --Ceftriaxone 2 g IV every 24 hours --CBC daily, monitor fever curve  Severe AS with bicuspid aortic valve s/p bioprosthetic AVR 2020; now with perivalvular abscess CT coronary 09/29/2020 with findings consistent with perivalvular abscess posterior to the aortic valve bioprosthesis. --TCTS following -- Oral surgery, Dr. Barbette Merino consulted for evaluation for teeth extraction in anticipation of surgery to replace prosthetic material with a homograft root per CTS --Continue antibiotics as above per ID  Essential hypertension Patient was noted to have intermittent hypotension at [facility].  On lisinopril and metoprolol at home. --BP 107/66 this morning --Continue metoprolol tartrate 25 mg p.o. twice daily --Continue to hold home lisinopril for now --Continue aspirin and statin --Continue monitor BP closely  Hyperlipidemia --Continue simvastatin 40 mg p.o. daily  Type 2 diabetes mellitus, with hyperglycemia Hemoglobin A1c 9.2 on 09/19/2020, poorly controlled.  On metformin 500 mg p.o. twice daily outpatient. --Hold home metformin while inpatient; would benefit from increase to 1000mg  twice daily on discharge --Lantus 10 units subcutaneously daily --SSI for coverage --CBGs before every meal/at bedtime  GERD: Continue PPI  Severe protein calorie malnutrition Body mass index is 28.34 kg/m.  Notable moderate muscle depletion and 9% weight loss in 1 month. Nutrition Status: Nutrition Problem: Severe Malnutrition Etiology: acute illness (endocarditis) Signs/Symptoms: moderate muscle depletion, percent weight loss (9% weight loss within 1 month) Percent weight loss: 9 % Interventions: MVI, Other (Comment) (Ensure Max) --Dietitian following, appreciate assistance. --Continue to  encourage increased oral intake, supplements  DVT prophylaxis: heparin injection 5,000 Units Start: 09/26/20 2300    Code Status: Full Code Family Communication: None present at bedside, updated patient's spouse Junious Dresser via telephone this morning  Disposition Plan:  Level of care: Telemetry Medical Status is: Inpatient  Remains inpatient appropriate because:Ongoing diagnostic testing needed not appropriate for outpatient work up, Unsafe d/c plan, IV treatments appropriate due to intensity of illness or inability to take PO, and Inpatient level of care appropriate due to severity of illness  Dispo: The patient is from: Home              Anticipated d/c is to: Home              Patient currently is not medically stable to d/c.   Difficult to place patient No  Consultants:  Cardiology Infectious disease Cardiothoracic surgery Oral surgery, Dr. Barbette Merino  Procedures:  TTE TEE Gated cardiac CTA  Antimicrobials:  Ceftriaxone   Subjective: Patient seen examined bedside, resting comfortably.  No specific complaints this morning.  Discussed with patient CT findings with concern for perivalvular abscess.  Cardiothoracic surgery was consulted last night.  Discussed case with oral surgery, Dr. Barbette Merino this morning for consideration of teeth extraction in anticipation of aortic valve replacement.  Spouse updated via telephone this morning.  Denies headache, no dizziness, no chest pain, palpitations, no shortness of breath, no fever/chills/night sweats, no nausea/vomiting/diarrhea, no weakness, no fatigue, no paresthesias.  No acute events overnight per nursing staff.  Objective: Vitals:   09/29/20 2010 09/30/20 0422 09/30/20 0542 09/30/20 0740  BP: 108/67 106/64 107/66 114/63  Pulse: 92 81  74  Resp: Temp: 98.6 F (37 C) 98 F (36.7 C) 97.6 F (36.4 C) 98.1 F (36.7 C)  TempSrc: Oral Oral Oral Oral  SpO2: 93% 95% 95% 93%  Weight:      Height:        Intake/Output  Summary (Last 24 hours) at 09/30/2020 1037 Last data filed at 09/30/2020 1017 Gross per 24 hour  Intake 360 ml  Output --  Net 360 ml   Filed Weights   09/26/20 2121 09/27/20 0335 09/28/20 0333  Weight: 86 kg 85 kg 84.6 kg    Examination:  General exam: Appears calm and comfortable, poor dentition, moderate muscle wasting and fat depletion noted Respiratory system: Clear to auscultation. Respiratory effort normal.  On room air Cardiovascular system: S1 & S2 heard, RRR. No JVD, murmurs, rubs, gallops or clicks. No pedal edema. Gastrointestinal system: Abdomen is nondistended, soft and nontender. No organomegaly or masses felt. Normal bowel sounds heard. Central nervous system: Alert and oriented. No focal neurological deficits. Extremities: Symmetric 5 x 5 power. Skin: No rashes, lesions or ulcers Psychiatry: Judgement and insight appear normal. Mood & affect appropriate.     Data Reviewed: I have personally reviewed following labs and imaging studies  CBC: Recent Labs  Lab 09/26/20 2223 09/27/20 0821 09/28/20 0353 09/29/20 0417 09/30/20 0220  WBC 8.2 7.5 11.0* 10.2 8.2  NEUTROABS 5.6 4.8 7.4 7.0 5.6  HGB 9.6* 10.2* 10.2* 10.5* 9.6*  HCT 30.1* 31.6* 31.1* 32.6* 29.9*  MCV 92.9 92.9 92.8 92.6 92.0  PLT 452* 405* 390 385 398   Basic Metabolic Panel: Recent Labs  Lab 09/26/20 2223 09/27/20 0821 09/28/20 0353 09/29/20 0417 09/30/20 0220  NA 132* 133* 133* 135 132*  K 4.1 4.4 3.8 4.4 4.1  CL 98 100 99 102 101  CO2 GLUCOSE 222*  162* 195* 214* 238*  BUN 11 10 10 11 8   CREATININE 0.78 0.66 0.70 0.72 0.63  CALCIUM 8.9 9.0 9.1 9.1 8.7*  MG 1.9  --   --   --   --    GFR: Estimated Creatinine Clearance: 109.2 mL/min (by C-G formula based on SCr of 0.63 mg/dL). Liver Function Tests: Recent Labs  Lab 09/26/20 2223  AST 24  ALT 25  ALKPHOS 60  BILITOT 0.4  PROT 6.7  ALBUMIN 2.5*   No results for input(s): LIPASE, AMYLASE in the last 168  hours. No results for input(s): AMMONIA in the last 168 hours. Coagulation Profile: Recent Labs  Lab 09/26/20 2223  INR 1.0   Cardiac Enzymes: No results for input(s): CKTOTAL, CKMB, CKMBINDEX, TROPONINI in the last 168 hours. BNP (last 3 results) No results for input(s): PROBNP in the last 8760 hours. HbA1C: No results for input(s): HGBA1C in the last 72 hours. CBG: Recent Labs  Lab 09/29/20 0726 09/29/20 1237 09/29/20 1702 09/29/20 2119 09/30/20 0737  GLUCAP 189* 151* 227* 265* 222*   Lipid Profile: No results for input(s): CHOL, HDL, LDLCALC, TRIG, CHOLHDL, LDLDIRECT in the last 72 hours. Thyroid Function Tests: No results for input(s): TSH, T4TOTAL, FREET4, T3FREE, THYROIDAB in the last 72 hours. Anemia Panel: No results for input(s): VITAMINB12, FOLATE, FERRITIN, TIBC, IRON, RETICCTPCT in the last 72 hours. Sepsis Labs: No results for input(s): PROCALCITON, LATICACIDVEN in the last 168 hours.  Recent Results (from the past 240 hour(s))  Culture, blood (routine x 2)     Status: None (Preliminary result)   Collection Time: 09/26/20 11:48 PM   Specimen: BLOOD  Result Value Ref Range Status   Specimen Description BLOOD SITE NOT SPECIFIED  Final   Special Requests   Final    BOTTLES DRAWN AEROBIC AND ANAEROBIC Blood Culture results may not be optimal due to an excessive volume of blood received in culture bottles   Culture   Final    NO GROWTH 3 DAYS Performed at [facility], [address] [place], [place]    Report Status PENDING  Incomplete  Culture, blood (routine x 2)     Status: None (Preliminary result)   Collection Time: 09/26/20 11:48 PM   Specimen: BLOOD  Result Value Ref Range Status   Specimen Description BLOOD SITE NOT SPECIFIED  Final   Special Requests   Final    BOTTLES DRAWN AEROBIC AND ANAEROBIC Blood Culture adequate volume   Culture   Final    NO GROWTH 3 DAYS Performed at [facility], [address]  [place], [place]    Report Status PENDING  Incomplete  SARS CORONAVIRUS 2 (TAT 6-24 HRS) Nasopharyngeal Nasopharyngeal Swab     Status: None   Collection Time: 09/27/20  3:20 AM   Specimen: Nasopharyngeal Swab  Result Value Ref Range Status   SARS Coronavirus 2 NEGATIVE NEGATIVE Final    Comment: (NOTE) SARS-CoV-2 target nucleic acids are NOT DETECTED.  The SARS-CoV-2 RNA is generally detectable in upper and lower respiratory specimens during the acute phase of infection. Negative results do not preclude SARS-CoV-2 infection, do not rule out co-infections with other pathogens, and should not be used as the sole basis for treatment or other patient management decisions. Negative results must be combined with clinical observations, patient history, and epidemiological information. The expected result is Negative.  Fact Sheet for Patients: 11/27/20  Fact Sheet for Healthcare Providers: HairSlick.no  This test is  not yet approved or cleared by the [place] FDA and  has been authorized[place] and/or diagnosis of SARS-CoV-2 by FDA under an Emergency Use Authorization (EUA). This EUA will remain  in effect (meaning this test can be used) for the duration of the COVID-19 declaration under Se ction 564(b)(1) of the Act, 21 U.S.C. section 360bbb-3(b)(1), unless the authorization is terminated or revoked sooner.  Performed at [facility], [address] [place], [place] [address]          Radiology Studies: CT CORONARY MORPH W/CTA COR W/SCORE W/CA W/CM &/OR WO/CM  Addendum Date: 09/29/2020   ADDENDUM REPORT: 09/29/2020 18:43 CLINICAL DATA:  Bacteremia, concern for perivalvular abscess. 55 yo male with bacteremia and fevers, with history of bicuspid aortic valve stenosis and ascending aortic aneurysm, s/p Biological Bentall Procedure using a 25 mm Edwards Magna-Ease pericardial valve and a 28 mm  Gelweave Valsalva graft, as well as replacement of the ascending aorta (hemi-arch) using a 28 mm Hemashield graft on 09/10/2018 with Dr. Laneta Simmers. COMPARISON INTRAOPERATIVE TEE 09/10/2018, CTA CHEST AORTA 10/20/19, TRANSTHORACIC ECHO 04/03/20. EXAM: Cardiac ECG gated CT angiogram TECHNIQUE: The patient was scanned on a Siemens Force 192 slice scanner. A 120 kV retrospective scan was triggered in the descending thoracic aorta at 111 HU's. Gantry rotation speed was 270 msecs and collimation was .9 mm. The 3D data set was reconstructed in 5% intervals of the R-R cycle. Systolic and diastolic phases were analyzed on a dedicated work station using MPR, MIP and VRT modes. The patient received OMNIPAQUE IOHEXOL 350 MG/ML SOLN of contrast. Nitroglycerin could not be administered due to hypotension. FINDINGS: AORTIC VALVE: There is a bioprosthesis in the aortic position, as part of a valved conduit. Posterior to the valve, there is a 3 x 3 x 3 cm area of mixed attenuation, with low density centrally and relative peripheral enhancement, consistent with perivalvular abscess. This finding was not present on CTA chest performed 10/20/19, and is not seen on immediately post-operative TEE. There is no contrast opacification of this space, and no evidence of peri-graft pseudoaneurysm or fistula. The prosthetic valve leaflets are normal with normal excursion. AORTA: The sinus of Valsalva and ascending aorta have been replaced. There is thickening and fluid density consistent with extension of above noted periprosthetic abscess. The ascending aorta graft otherwise appears stable. The ascending aorta distal to the graft is normal caliber, 33 mm at distal ascending aorta just before the common origin of common carotid arteries. CORONARY ARTERIES: Study was not performed to evaluate coronary arteries. Nitroglycerin was not given due to hypotension. The left main coronary artery is encircled by mixed attenuation mass consistent with  abscess. No communication with coronary artery seen. LM is free of disease and is not compressed by abscess. Left dominant circulation. Calcifications in LAD and L circumflex. Proximal LAD and proximal L circumflex do not appear to have severe stenosis. RCA is small and appears patent. OTHER: Mild dilation of main pulmonary artery, 30 mm. No left atrial appendage thrombus. Normal pulmonary vein drainage into the left atrium. IMPRESSION: 1. Findings consistent with perivalvular abscess posterior to the aortic valve bioprosthesis. The left main coronary artery is patent and traverses this area of mixed attenuation without compression of the left main coronary artery. Findings communicated to the cardiology consult service. Recommend cardiac surgery consult. Electronically Signed   By: Weston Brass   On: 09/29/2020 18:43   Result Date: 09/29/2020 EXAM: OVER-READ INTERPRETATION  CT CHEST The following  report is an over-read performed by radiologist Dr. Corlis Leak Hassell [facility] on 09/29/2020. This over-read does not include interpretation of cardiac or coronary anatomy or pathology. The coronary calcium score/coronary CTA interpretation by the cardiologist is dictated separately. COMPARISON:  10/20/2019 FINDINGS: Vascular: Heart size normal. Fair contrast opacification of pulmonary artery branches; the exam was not optimized for detection of pulmonary emboli. Coronary calcifications. Previous AVR. Stable ascending aortic tube graft repair. Good contrast opacification of the thoracic aorta without dissection, aneurysm, or stenosis. Common origin of common carotid arteries with some eccentric partially calcified plaque, no high-grade stenosis. Left subclavian artery widely patent. Aberrant origin of the right subclavian artery which takes a retroesophageal course. Mediastinum/Nodes: No mass or adenopathy. Lungs/Pleura: No pleural effusion. No pneumothorax. Lungs are clear. Upper Abdomen: No acute  findings. Musculoskeletal: Sternotomy wires. Anterior vertebral endplate spurring at multiple levels in the lower thoracic spine. No fracture or worrisome bone lesion. IMPRESSION: 1. No acute findings. 2. Stable changes of Bentall repair. 3. Coronary and Aortic Atherosclerosis (ICD10-170.0). 4. Aberrant origin of right subclavian artery, an anatomic variant. Electronically Signed: By: Corlis Leak  Hassell M.D. On: 09/29/2020 15:14   ECHO TEE  Result Date: 09/29/2020    TRANSESOPHOGEAL ECHO REPORT   Patient Name:   Ronnie Ruiz Date of Exam: 09/28/2020 Medical Rec #:  [id number]      Height:       68.0 in Accession #:    [id number]     Weight:       186.4 lb Date of Birth:  28-Jul-1965      BSA:          1.983 m Patient Age:    55 years       BP:           101/66 mmHg Patient Gender: M              HR:           82 bpm. Exam Location:  Inpatient Procedure: 2D Echo, Cardiac Doppler and Color Doppler                               MODIFIED REPORT: This report was modified by Weston BrassGayatri Acharya MD on 09/29/2020 due to revision.  Indications:     Bacteremia  History:         Patient has prior history of Echocardiogram examinations.                  Biological Bentall Procedure using a 25 mm Edwards Magna-Ease                  pericardial valve and a 28 mm Gelweave Valsalva graft.                  Aortic Valve: 25 mm Magna-Ease pericardial valve is present in                  the aortic position. Procedure Date: 09/10/2018.  Sonographer:     Roosvelt MaserRachel Lane RDCS Referring Phys:  [id number] Larita FifeMUHAMMAD S [facility] Diagnosing Phys: Weston BrassGayatri Acharya MD PROCEDURE: After discussion of the risks and benefits of a TEE, an informed consent was obtained from the patient. TEE procedure time was 34 minutes. The transesophogeal probe was passed without difficulty through the esophogus of the patient. Local oropharyngeal anesthetic was provided with Cetacaine. Sedation performed by different physician. The patient was monitored while under  deep sedation. Image  quality was good. The patient's vital signs; including heart rate, blood pressure, and oxygen saturation; remained stable throughout the procedure. The patient developed no complications during the procedure. IMPRESSIONS  1. There is a valved conduit present in the aortic position and aortic root. There is abnormal thickening posterior to the valved conduit beginning at the level of the valve and extending to the sinus of Valsalva, extending along the aorto-mitral continuity. There is an area of echolucency posterior to the prosthetic valve, concerning for perivalvular abscess. There is no flow into this region to suggest a fistulous track. The left coronary artery courses into this space, no definite communication. . The aortic valve has been repaired/replaced. Aortic valve regurgitation is not visualized. EOA 2.7 cm2, iEOA 1.36 cm2/m2, AT 82 msec, DVI 0.72. Normal function of aortic valve prosthesis. There is a 25 mm Magna-Ease pericardial valve present in the aortic position. Procedure Date: 09/10/2018. Echo findings are concerning for abscess of the aortic prosthesis. Recommend gated CTA Heart to further evaluate findings.  2. Left ventricular ejection fraction, by estimation, is 60%. The left ventricle has normal function.  3. Right ventricular systolic function is normal. The right ventricular size is normal.  4. Left atrial size was mildly dilated. No left atrial/left atrial appendage thrombus was detected.  5. The mitral valve is normal in structure. Trivial mitral valve regurgitation.  6. S/p 28 mm Gelweave Valsalva graft (Bentall procedure). Aorta is normal caliber where visualized distal to the graft. . Aortic root/ascending aorta has been repaired/replaced.  7. Evidence of atrial level shunting detected by color flow Doppler. Agitated saline contrast bubble study was positive with shunting observed within 3-6 cardiac cycles suggestive of interatrial shunt. There is a small patent foramen ovale with  bidirectional shunting across atrial septum.  8. No valvular vegetations noted, however there is concern for perivavular abscess around aortic valve prosthesis as noted in Impression #1. Conclusion(s)/Recommendation(s): Discussed findings with cardiology consult service, will arrange for CTA Heart gated study. FINDINGS  Left Ventricle: Left ventricular ejection fraction, by estimation, is 60%. The left ventricle has normal function. The left ventricular internal cavity size was normal in size. Right Ventricle: The right ventricular size is normal. No increase in right ventricular wall thickness. Right ventricular systolic function is normal. Left Atrium: Left atrial size was mildly dilated. No left atrial/left atrial appendage thrombus was detected. Right Atrium: Right atrial size was normal in size. Pericardium: There is no evidence of pericardial effusion. Mitral Valve: The mitral valve is normal in structure. Trivial mitral valve regurgitation. There is no evidence of mitral valve vegetation. Tricuspid Valve: The tricuspid valve is normal in structure. Tricuspid valve regurgitation is trivial. There is no evidence of tricuspid valve vegetation. Aortic Valve: There is a valved conduit present in the aortic position and aortic root. There is abnormal thickening posterior to the valved conduit beginning at the level of the valve and extending to the sinus of Valsalva, extending along the aorto-mitral continuity. There is an area of echolucency posterior to the prosthetic valve, concerning for perivalvular abscess. There is no flow into this region to suggest a fistulous track. The left coronary artery courses into this space, no definite  communication. The aortic valve has been repaired/replaced. Aortic valve regurgitation is not visualized. EOA 2.7 cm2, iEOA 1.36 cm2/m2, AT 82 msec, DVI 0.72. Normal function of aortic valve prosthesis. Aortic valve mean gradient measures 10.0 mmHg. Aortic valve peak gradient  measures 16.1 mmHg. Aortic valve area, by VTI  measures 2.74 cm. There is a 25 mm Magna-Ease pericardial valve present in the aortic position. Procedure Date: 09/10/2018. Pulmonic Valve: The pulmonic valve was normal in structure. Pulmonic valve regurgitation is trivial. There is no evidence of pulmonic valve vegetation. Aorta: S/p 28 mm Gelweave Valsalva graft (Bentall procedure). Aorta is normal caliber where visualized distal to the graft. The aortic root/ascending aorta has been repaired/replaced. There is minimal (Grade I) atheroma plaque involving the transverse aorta. IAS/Shunts: Evidence of atrial level shunting detected by color flow Doppler. Agitated saline contrast was given intravenously to evaluate for intracardiac shunting. Agitated saline contrast bubble study was positive with shunting observed within 3-6 cardiac cycles suggestive of interatrial shunt. A small patent foramen ovale is detected with bidirectional shunting across atrial septum.  LEFT VENTRICLE PLAX 2D LVOT diam:     2.20 cm LV SV:         92 LV SV Index:   46 LVOT Area:     3.80 cm  AORTIC VALVE AV Area (Vmax):    2.86 cm AV Area (Vmean):   2.83 cm AV Area (VTI):     2.74 cm AV Vmax:           200.50 cm/s AV Vmean:          149.000 cm/s AV VTI:            0.336 m AV Peak Grad:      16.1 mmHg AV Mean Grad:      10.0 mmHg LVOT Vmax:         151.00 cm/s LVOT Vmean:        111.000 cm/s LVOT VTI:          0.242 m LVOT/AV VTI ratio: 0.72  AORTA Ao Asc diam: 3.00 cm  SHUNTS Systemic VTI:  0.24 m Systemic Diam: 2.20 cm Weston Brass MD Electronically signed by Weston Brass MD Signature Date/Time: 09/29/2020/5:57:21 PM    Final (Updated)         Scheduled Meds:  aspirin EC  81 mg Oral Daily   atorvastatin  80 mg Oral Daily   docusate sodium  100 mg Oral BID   heparin  5,000 Units Subcutaneous Q8H   insulin aspart  0-15 Units Subcutaneous TID WC   insulin aspart  0-5 Units Subcutaneous QHS   insulin glargine  10 Units  Subcutaneous Daily   metoprolol tartrate  25 mg Oral BID   multivitamin with minerals  1 tablet Oral Daily   pantoprazole  40 mg Oral Daily   Ensure Max Protein  11 oz Oral BID   Continuous Infusions:  cefTRIAXone (ROCEPHIN)  IV 2 g (09/30/20 0854)     LOS: 4 days    Time spent: 40 minutes spent on chart review, discussion with nursing staff, consultants, updating family and interview/physical exam; more than 50% of that time was spent in counseling and/or coordination of care.    Alvira Philips [place], DO [facility] Available via Epic secure chat 7am-7pm After these hours, please refer to coverage provider listed on [email or web address] 09/30/2020, 10:37 AM

## 2020-09-30 NOTE — Progress Notes (Addendum)
Progress Note  Patient Name: Ronnie Ruiz Date of Encounter: 09/30/2020  [facility] Cardiologist: Gypsy Balsamobert Krasowski, MD   Subjective  Denies any chest pain or SOB.  Coronary CTA yesterday showed perivalvular abscess posterior to the AVR bioprosthesis with patent LM that traverses the area of mixed attenuation without compression of the LMCA.    Inpatient Medications    Scheduled Meds:  aspirin EC  81 mg Oral Daily   heparin  5,000 Units Subcutaneous Q8H   insulin aspart  0-15 Units Subcutaneous TID WC   insulin aspart  0-5 Units Subcutaneous QHS   metoprolol tartrate  25 mg Oral BID   multivitamin with minerals  1 tablet Oral Daily   pantoprazole  40 mg Oral Daily   Ensure Max Protein  11 oz Oral BID   simvastatin  40 mg Oral Daily   Continuous Infusions:  cefTRIAXone (ROCEPHIN)  IV 2 g (09/29/20 0845)   PRN Meds: acetaminophen, docusate sodium, nitroGLYCERIN, ondansetron (ZOFRAN) IV, zolpidem   Vital Signs    Vitals:   09/29/20 2010 09/30/20 0422 09/30/20 0542 09/30/20 0740  BP: 108/67 106/64 107/66 114/63  Pulse: 92 81  74  Resp: 20 17 15 16   Temp: 98.6 F (37 C) 98 F (36.7 C) 97.6 F (36.4 C) 98.1 F (36.7 C)  TempSrc: Oral Oral Oral Oral  SpO2: 93% 95% 95% 93%  Weight:      Height:       No intake or output data in the 24 hours ending 09/30/20 0754  Last 3 Weights 09/28/2020 09/27/2020 09/26/2020  Weight (lbs) 186 lb 6.4 oz 187 lb 8 oz 189 lb 9.6 oz  Weight (kg) 84.55 kg 85.049 kg 86.002 kg      Telemetry    NSR with intermittent bigeminal PVCs -  Personally Reviewed  ECG  No new EKG to review- Personally Reviewed  Physical Exam   GEN: Well nourished, well developed in no acute distress HEENT: Normal NECK: No JVD; No carotid bruits LYMPHATICS: No lymphadenopathy CARDIAC:RRR, no rubs, gallops, 2/6 SM at RUSB RESPIRATORY:  Clear to auscultation without rales, wheezing or rhonchi  ABDOMEN: Soft, non-tender, non-distended MUSCULOSKELETAL:  No  edema; No deformity  SKIN: Warm and dry NEUROLOGIC:  Alert and oriented x 3 PSYCHIATRIC:  Normal affect   Labs    High Sensitivity Troponin:  No results for input(s): TROPONINIHS in the last 720 hours.    Chemistry Recent Labs  Lab 09/26/20 2223 09/27/20 0821 09/28/20 0353 09/29/20 0417 09/30/20 0220  NA 132*   < > 133* 135 132*  K 4.1   < > 3.8 4.4 4.1  CL 98   < > 99 102 101  CO2 26   < > 26 25 23   GLUCOSE 222*   < > 195* 214* 238*  BUN 11   < > 10 11 8   CREATININE 0.78   < > 0.70 0.72 0.63  CALCIUM 8.9   < > 9.1 9.1 8.7*  PROT 6.7  --   --   --   --   ALBUMIN 2.5*  --   --   --   --   AST 24  --   --   --   --   ALT 25  --   --   --   --   ALKPHOS 60  --   --   --   --   BILITOT 0.4  --   --   --   --  GFRNONAA >60   < > >60 >60 >60  ANIONGAP 8   < > 8 8 8    < > = values in this interval not displayed.      Hematology Recent Labs  Lab 09/28/20 0353 09/29/20 0417 09/30/20 0220  WBC 11.0* 10.2 8.2  RBC 3.35* 3.52* 3.25*  HGB 10.2* 10.5* 9.6*  HCT 31.1* 32.6* 29.9*  MCV 92.8 92.6 92.0  MCH 30.4 29.8 29.5  MCHC 32.8 32.2 32.1  RDW 13.9 14.0 13.9  PLT 390 385 398     BNPNo results for input(s): BNP, PROBNP in the last 168 hours.   DDimer No results for input(s): DDIMER in the last 168 hours.   Radiology    CT CORONARY MORPH W/CTA COR W/SCORE W/CA W/CM &/OR WO/CM  Addendum Date: 09/29/2020   ADDENDUM REPORT: 09/29/2020 18:43 CLINICAL DATA:  Bacteremia, concern for perivalvular abscess. 55 yo male with bacteremia and fevers, with history of bicuspid aortic valve stenosis and ascending aortic aneurysm, s/p Biological Bentall Procedure using a 25 mm Edwards Magna-Ease pericardial valve and a 28 mm Gelweave Valsalva graft, as well as replacement of the ascending aorta (hemi-arch) using a 28 mm Hemashield graft on 09/10/2018 with Dr. 09/12/2018. COMPARISON INTRAOPERATIVE TEE 09/10/2018, CTA CHEST AORTA 10/20/19, TRANSTHORACIC ECHO 04/03/20. EXAM: Cardiac ECG gated CT  angiogram TECHNIQUE: The patient was scanned on a Siemens Force 192 slice scanner. A 120 kV retrospective scan was triggered in the descending thoracic aorta at 111 HU's. Gantry rotation speed was 270 msecs and collimation was .9 mm. The 3D data set was reconstructed in 5% intervals of the R-R cycle. Systolic and diastolic phases were analyzed on a dedicated work station using MPR, MIP and VRT modes. The patient received 04/05/20 OMNIPAQUE IOHEXOL 350 MG/ML SOLN of contrast. Nitroglycerin could not be administered due to hypotension. FINDINGS: AORTIC VALVE: There is a bioprosthesis in the aortic position, as part of a valved conduit. Posterior to the valve, there is a 3 x 3 x 3 cm area of mixed attenuation, with low density centrally and relative peripheral enhancement, consistent with perivalvular abscess. This finding was not present on CTA chest performed 10/20/19, and is not seen on immediately post-operative TEE. There is no contrast opacification of this space, and no evidence of peri-graft pseudoaneurysm or fistula. The prosthetic valve leaflets are normal with normal excursion. AORTA: The sinus of Valsalva and ascending aorta have been replaced. There is thickening and fluid density consistent with extension of above noted periprosthetic abscess. The ascending aorta graft otherwise appears stable. The ascending aorta distal to the graft is normal caliber, 33 mm at distal ascending aorta just before the common origin of common carotid arteries. CORONARY ARTERIES: Study was not performed to evaluate coronary arteries. Nitroglycerin was not given due to hypotension. The left main coronary artery is encircled by mixed attenuation mass consistent with abscess. No communication with coronary artery seen. LM is free of disease and is not compressed by abscess. Left dominant circulation. Calcifications in LAD and L circumflex. Proximal LAD and proximal L circumflex do not appear to have severe stenosis. RCA is small and  appears patent. OTHER: Mild dilation of main pulmonary artery, 30 mm. No left atrial appendage thrombus. Normal pulmonary vein drainage into the left atrium. IMPRESSION: 1. Findings consistent with perivalvular abscess posterior to the aortic valve bioprosthesis. The left main coronary artery is patent and traverses this area of mixed attenuation without compression of the left main coronary artery. Findings communicated to the cardiology  consult service. Recommend cardiac surgery consult. Electronically Signed   By: Weston Brass   On: 09/29/2020 18:43   Result Date: 09/29/2020 EXAM: OVER-READ INTERPRETATION  CT CHEST The following report is an over-read performed by radiologist Dr. Corlis Leak of [facility] on 09/29/2020. This over-read does not include interpretation of cardiac or coronary anatomy or pathology. The coronary calcium score/coronary CTA interpretation by the cardiologist is dictated separately. COMPARISON:  10/20/2019 FINDINGS: Vascular: Heart size normal. Fair contrast opacification of pulmonary artery branches; the exam was not optimized for detection of pulmonary emboli. Coronary calcifications. Previous AVR. Stable ascending aortic tube graft repair. Good contrast opacification of the thoracic aorta without dissection, aneurysm, or stenosis. Common origin of common carotid arteries with some eccentric partially calcified plaque, no high-grade stenosis. Left subclavian artery widely patent. Aberrant origin of the right subclavian artery which takes a retroesophageal course. Mediastinum/Nodes: No mass or adenopathy. Lungs/Pleura: No pleural effusion. No pneumothorax. Lungs are clear. Upper Abdomen: No acute findings. Musculoskeletal: Sternotomy wires. Anterior vertebral endplate spurring at multiple levels in the lower thoracic spine. No fracture or worrisome bone lesion. IMPRESSION: 1. No acute findings. 2. Stable changes of Bentall repair. 3. Coronary and Aortic Atherosclerosis  (ICD10-170.0). 4. Aberrant origin of right subclavian artery, an anatomic variant. Electronically Signed: By: Corlis Leak M.D. On: 09/29/2020 15:14   ECHO TEE  Result Date: 09/29/2020    TRANSESOPHOGEAL ECHO REPORT   Patient Name:   RAJA LISKA Date of Exam: 09/28/2020 Medical Rec #:  [id number]      Height:       68.0 in Accession #:    [id number]     Weight:       186.4 lb Date of Birth:  [birth year]      BSA:          1.983 m Patient Age:    55 years       BP:           101/66 mmHg Patient Gender: M              HR:           82 bpm. Exam Location:  Inpatient Procedure: 2D Echo, Cardiac Doppler and Color Doppler                               MODIFIED REPORT: This report was modified by Weston Brass MD on 09/29/2020 due to revision.  Indications:     Bacteremia  History:         Patient has prior history of Echocardiogram examinations.                  Biological Bentall Procedure using a 25 mm Edwards Magna-Ease                  pericardial valve and a 28 mm Gelweave Valsalva graft.                  Aortic Valve: 25 mm Magna-Ease pericardial valve is present in                  the aortic position. Procedure Date: 09/10/2018.  Sonographer:     Roosvelt Maser RDCS Referring Phys:  [id number] Larita Fife [facility] Diagnosing Phys: Weston Brass MD PROCEDURE: After discussion of the risks and benefits of a TEE, an informed consent was obtained from the patient. TEE procedure time was 34  minutes. The transesophogeal probe was passed without difficulty through the esophogus of the patient. Local oropharyngeal anesthetic was provided with Cetacaine. Sedation performed by different physician. The patient was monitored while under deep sedation. Image quality was good. The patient's vital signs; including heart rate, blood pressure, and oxygen saturation; remained stable throughout the procedure. The patient developed no complications during the procedure. IMPRESSIONS  1. There is a valved conduit present in the aortic position  and aortic root. There is abnormal thickening posterior to the valved conduit beginning at the level of the valve and extending to the sinus of Valsalva, extending along the aorto-mitral continuity. There is an area of echolucency posterior to the prosthetic valve, concerning for perivalvular abscess. There is no flow into this region to suggest a fistulous track. The left coronary artery courses into this space, no definite communication. . The aortic valve has been repaired/replaced. Aortic valve regurgitation is not visualized. EOA 2.7 cm2, iEOA 1.36 cm2/m2, AT 82 msec, DVI 0.72. Normal function of aortic valve prosthesis. There is a 25 mm Magna-Ease pericardial valve present in the aortic position. Procedure Date: 09/10/2018. Echo findings are concerning for abscess of the aortic prosthesis. Recommend gated CTA Heart to further evaluate findings.  2. Left ventricular ejection fraction, by estimation, is 60%. The left ventricle has normal function.  3. Right ventricular systolic function is normal. The right ventricular size is normal.  4. Left atrial size was mildly dilated. No left atrial/left atrial appendage thrombus was detected.  5. The mitral valve is normal in structure. Trivial mitral valve regurgitation.  6. S/p 28 mm Gelweave Valsalva graft (Bentall procedure). Aorta is normal caliber where visualized distal to the graft. . Aortic root/ascending aorta has been repaired/replaced.  7. Evidence of atrial level shunting detected by color flow Doppler. Agitated saline contrast bubble study was positive with shunting observed within 3-6 cardiac cycles suggestive of interatrial shunt. There is a small patent foramen ovale with bidirectional shunting across atrial septum.  8. No valvular vegetations noted, however there is concern for perivavular abscess around aortic valve prosthesis as noted in Impression #1. Conclusion(s)/Recommendation(s): Discussed findings with cardiology consult service, will arrange for  CTA Heart gated study. FINDINGS  Left Ventricle: Left ventricular ejection fraction, by estimation, is 60%. The left ventricle has normal function. The left ventricular internal cavity size was normal in size. Right Ventricle: The right ventricular size is normal. No increase in right ventricular wall thickness. Right ventricular systolic function is normal. Left Atrium: Left atrial size was mildly dilated. No left atrial/left atrial appendage thrombus was detected. Right Atrium: Right atrial size was normal in size. Pericardium: There is no evidence of pericardial effusion. Mitral Valve: The mitral valve is normal in structure. Trivial mitral valve regurgitation. There is no evidence of mitral valve vegetation. Tricuspid Valve: The tricuspid valve is normal in structure. Tricuspid valve regurgitation is trivial. There is no evidence of tricuspid valve vegetation. Aortic Valve: There is a valved conduit present in the aortic position and aortic root. There is abnormal thickening posterior to the valved conduit beginning at the level of the valve and extending to the sinus of Valsalva, extending along the aorto-mitral continuity. There is an area of echolucency posterior to the prosthetic valve, concerning for perivalvular abscess. There is no flow into this region to suggest a fistulous track. The left coronary artery courses into this space, no definite  communication. The aortic valve has been repaired/replaced. Aortic valve regurgitation is not visualized. EOA 2.7 cm2, iEOA 1.36  cm2/m2, AT 82 msec, DVI 0.72. Normal function of aortic valve prosthesis. Aortic valve mean gradient measures 10.0 mmHg. Aortic valve peak gradient measures 16.1 mmHg. Aortic valve area, by VTI measures 2.74 cm. There is a 25 mm Magna-Ease pericardial valve present in the aortic position. Procedure Date: 09/10/2018. Pulmonic Valve: The pulmonic valve was normal in structure. Pulmonic valve regurgitation is trivial. There is no evidence of  pulmonic valve vegetation. Aorta: S/p 28 mm Gelweave Valsalva graft (Bentall procedure). Aorta is normal caliber where visualized distal to the graft. The aortic root/ascending aorta has been repaired/replaced. There is minimal (Grade I) atheroma plaque involving the transverse aorta. IAS/Shunts: Evidence of atrial level shunting detected by color flow Doppler. Agitated saline contrast was given intravenously to evaluate for intracardiac shunting. Agitated saline contrast bubble study was positive with shunting observed within 3-6 cardiac cycles suggestive of interatrial shunt. A small patent foramen ovale is detected with bidirectional shunting across atrial septum.  LEFT VENTRICLE PLAX 2D LVOT diam:     2.20 cm LV SV:         92 LV SV Index:   46 LVOT Area:     3.80 cm  AORTIC VALVE AV Area (Vmax):    2.86 cm AV Area (Vmean):   2.83 cm AV Area (VTI):     2.74 cm AV Vmax:           200.50 cm/s AV Vmean:          149.000 cm/s AV VTI:            0.336 m AV Peak Grad:      16.1 mmHg AV Mean Grad:      10.0 mmHg LVOT Vmax:         151.00 cm/s LVOT Vmean:        111.000 cm/s LVOT VTI:          0.242 m LVOT/AV VTI ratio: 0.72  AORTA Ao Asc diam: 3.00 cm  SHUNTS Systemic VTI:  0.24 m Systemic Diam: 2.20 cm Weston Brass MD Electronically signed by Weston Brass MD Signature Date/Time: 09/29/2020/5:57:21 PM    Final (Updated)     Cardiac Studies   TEE pending final read 13-Oct-2020: Informal echo report: Normal biventricular chamber size and function.   Normal aortic bioprosthesis with normal function and no regurgitation or paravalvular leak. No dehiscense or rocking. Prominent post operative changes around valve posteriorly and aortic graft, There is an echo free space posterior and lateral to the prosthesis, which appears contiguous with the native sinus of Valsalva, without flow demonstrated. This was also possibly present on CT from 2021, and may not be an acute finding. If there is a strong clinical  suspicion for endocarditis, wll consider gated cardiac CTA of the valve and aortic root to visualize this area well in the setting of fevers and bacteremia.    Coronary CTA 09/29/2020 CORONARY ARTERIES: Study was not performed to evaluate coronary arteries. Nitroglycerin was not given due to hypotension.   The left main coronary artery is encircled by mixed attenuation mass consistent with abscess. No communication with coronary artery seen. LM is free of disease and is not compressed by abscess.   Left dominant circulation. Calcifications in LAD and L circumflex. Proximal LAD and proximal L circumflex do not appear to have severe stenosis. RCA is small and appears patent.   OTHER:   Mild dilation of main pulmonary artery, 30 mm.   No left atrial appendage thrombus.   Normal pulmonary vein  drainage into the left atrium.   IMPRESSION: 1. Findings consistent with perivalvular abscess posterior to the aortic valve bioprosthesis. The left main coronary artery is patent and traverses this area of mixed attenuation without compression of the left main coronary artery.   Findings communicated to the cardiology consult service. Recommend cardiac surgery consult. Patient Profile     55 y.o. male with a PMH of bicuspid aortic valve s/p bioprosthetic AVR 2020, HTN, HLD, and DM type 2, who was found to have HACEK organism bacteremia at [facility], transferred to [facility] for TEE.   Assessment & Plan    HACEK organism bacteremia - cultures grew aggregatibacter negative for betalamase - TEE inconclusive - coronary CTA showed perivalvular abscess posterior and lateral to the AVR bioprosthesis with the LMCA traversing through an area of mixed attenuation without evidence of compression of the LMCA - CVTS has been consulted for redo surgery - dental consult pending perCVTS - ABX per ID  Hx of severe AS due to bicuspid aortic valve s/p bioprosthetic AVR/Bentall procedure in 2020 - as  above  Hypertension - home lisinopril on hold due to soft BP - continue Lopressor  BID - BP stable this am at 107/43mmHg  Nonobstructive CAD - no obstructive CAD on coronary CTA although not scanned as a coronary study - continue BB - no chest pain  Hyperlipidemia with LDL goal < 70 02/28/2020: Cholesterol, Total 192; HDL 48; LDL Chol Calc (NIH) 105; Triglycerides 226 - on 40 mg zocor - change Zocor to atorvastatin  daily - will need FLP and ALT in 6 weeks  SVT - brief episode on telemetry lasting 9 sec 09/27/20, symptomatic - he has not had any further SVT - electrolytes WNL - continue Lopressor  BID  I have spent a total of 30 minutes with patient reviewing coronary CTA, 2D echo , telemetry, EKGs, labs and examining patient as well as establishing an assessment and plan that was discussed with the patient.  > 50% of time was spent in direct patient care.     For questions or updates, please contact [facility] Please consult [email or web address] for contact info under        Signed, Armanda Magic, MD  09/30/2020, 7:54 AM

## 2020-09-30 NOTE — Progress Notes (Signed)
2 Days Post-Op Procedure(s) (LRB): TRANSESOPHAGEAL ECHOCARDIOGRAM (TEE) (N/A) BUBBLE STUDY Subjective: No complaints this AM  Objective: Vital signs in last 24 hours: Temp:  [97.6 F (36.4 C)-99.3 F (37.4 C)] 98.1 F (36.7 C) (06/11 0740) Pulse Rate:  [74-92] 81 (06/11 1054) Cardiac Rhythm: Normal sinus rhythm (06/11 0758) Resp:  [12-20] 16 (06/11 0740) BP: (101-114)/(60-67) 101/60 (06/11 1054) SpO2:  [93 %-96 %] 93 % (06/11 0740)  Hemodynamic parameters for last 24 hours:    Intake/Output from previous day: No intake/output data recorded. Intake/Output this shift: Total I/O In: 360 [P.O.:360] Out: -   General appearance: alert, cooperative, and no distress Neurologic: intact Heart: regular rate and rhythm and 3/6 systolic murmur  Lab Results: Recent Labs    09/29/20 0417 09/30/20 0220  WBC 10.2 8.2  HGB 10.5* 9.6*  HCT 32.6* 29.9*  PLT 385 398   BMET:  Recent Labs    09/29/20 0417 09/30/20 0220  NA 135 132*  K 4.4 4.1  CL 102 101  CO2 25 23  GLUCOSE 214* 238*  BUN 11 8  CREATININE 0.72 0.63  CALCIUM 9.1 8.7*    PT/INR: No results for input(s): LABPROT, INR in the last 72 hours. ABG    Component Value Date/Time   PHART 7.383 09/11/2018 0239   HCO3 22.9 09/11/2018 0239   TCO2 27 09/11/2018 1643   ACIDBASEDEF 2.0 09/11/2018 0239   O2SAT 96.0 09/11/2018 0239   CBG (last 3)  Recent Labs    09/29/20 2119 09/30/20 0737 09/30/20 1134  GLUCAP 265* 222* 264*    Assessment/Plan: S/P Procedure(s) (LRB): TRANSESOPHAGEAL ECHOCARDIOGRAM (TEE) (N/A) BUBBLE STUDY Prosthetic valve/ graft endocarditis  with Aggregatibacter Afebrile and normal WBC Cardiac CT shows a probable peri-root phlegmon/ abscess. No rocking or perivalvular leak at prosthetic valve PR interval normal on tele For dental consult- I have ordered an orthopantogram Continue IV antibiotics and close observation   LOS: 4 days    Loreli Slot 09/30/2020

## 2020-10-01 DIAGNOSIS — K029 Dental caries, unspecified: Secondary | ICD-10-CM

## 2020-10-01 DIAGNOSIS — I330 Acute and subacute infective endocarditis: Secondary | ICD-10-CM

## 2020-10-01 DIAGNOSIS — Q231 Congenital insufficiency of aortic valve: Secondary | ICD-10-CM | POA: Diagnosis not present

## 2020-10-01 LAB — CBC WITH DIFFERENTIAL/PLATELET
Abs Immature Granulocytes: 0.05 10*3/uL (ref 0.00–0.07)
Basophils Absolute: 0.1 10*3/uL (ref 0.0–0.1)
Basophils Relative: 1 %
Eosinophils Absolute: 0.1 10*3/uL (ref 0.0–0.5)
Eosinophils Relative: 2 %
HCT: 30 % — ABNORMAL LOW (ref 39.0–52.0)
Hemoglobin: 9.9 g/dL — ABNORMAL LOW (ref 13.0–17.0)
Immature Granulocytes: 1 %
Lymphocytes Relative: 21 %
Lymphs Abs: 1.7 10*3/uL (ref 0.7–4.0)
MCH: 30.1 pg (ref 26.0–34.0)
MCHC: 33 g/dL (ref 30.0–36.0)
MCV: 91.2 fL (ref 80.0–100.0)
Monocytes Absolute: 0.6 10*3/uL (ref 0.1–1.0)
Monocytes Relative: 8 %
Neutro Abs: 5.5 10*3/uL (ref 1.7–7.7)
Neutrophils Relative %: 67 %
Platelets: 357 10*3/uL (ref 150–400)
RBC: 3.29 MIL/uL — ABNORMAL LOW (ref 4.22–5.81)
RDW: 13.7 % (ref 11.5–15.5)
WBC: 8.1 10*3/uL (ref 4.0–10.5)
nRBC: 0 % (ref 0.0–0.2)

## 2020-10-01 LAB — BASIC METABOLIC PANEL
Anion gap: 9 (ref 5–15)
BUN: 7 mg/dL (ref 6–20)
CO2: 26 mmol/L (ref 22–32)
Calcium: 9 mg/dL (ref 8.9–10.3)
Chloride: 97 mmol/L — ABNORMAL LOW (ref 98–111)
Creatinine, Ser: 0.71 mg/dL (ref 0.61–1.24)
GFR, Estimated: >60 mL/min (ref >60)
Glucose, Bld: 208 mg/dL — ABNORMAL HIGH (ref 70–99)
Potassium: 4.1 mmol/L (ref 3.5–5.1)
Sodium: 132 mmol/L — ABNORMAL LOW (ref 135–145)

## 2020-10-01 LAB — GLUCOSE, CAPILLARY
Glucose-Capillary: 218 mg/dL — ABNORMAL HIGH (ref 70–99)
Glucose-Capillary: 256 mg/dL — ABNORMAL HIGH (ref 70–99)
Glucose-Capillary: 250 mg/dL — ABNORMAL HIGH (ref 70–99)
Glucose-Capillary: 218 mg/dL — ABNORMAL HIGH (ref 70–99)

## 2020-10-01 MED ORDER — MAGNESIUM HYDROXIDE 400 MG/5ML PO SUSP
30.0000 mL | Freq: Every day | ORAL | Status: DC | PRN
  Administered 2020-10-01: 30 mL via ORAL
  Filled 2020-10-01: qty 30

## 2020-10-01 MED ORDER — INSULIN GLARGINE 100 UNIT/ML ~~LOC~~ SOLN
20.0000 [IU] | Freq: Every day | SUBCUTANEOUS | Status: DC
  Administered 2020-10-01 – 2020-10-03 (×3): 20 [IU] via SUBCUTANEOUS
  Filled 2020-10-01 (×5): qty 0.2

## 2020-10-01 NOTE — Consult Note (Signed)
Ronnie Ruiz is an 55 y.o. male Ronnie Ruiz is an 55 y.o. male PMH of bicuspid aortic valve s/p bioprosthetic AVR 2020, HTN, HLD, and DM type 2, admitted for endocarditis. Needs dental exam to evaluate for source of infection.  CC: Hasn't sen dentist in 2 years. Denies dental pain, swelling, sensitivity. Occasional bleeding gums.    Past Medical History:  Diagnosis Date   Alcohol dependency (HCC) 06/24/2018   Alcoholism (HCC) 06/24/2018   Aortic stenosis    Ascending aortic aneurysm (HCC) 41 mm as measured by echo 07/02/2018   Atherosclerotic heart disease 06/24/2018   Bicuspid aortic valve 06/25/2018   Critical aortic valve stenosis mean gradient 64 mmHg, 06/25/2018   Diabetes mellitus type 2, controlled (HCC) 06/24/2018   Dyspnea    Essential hypertension 06/24/2018   Fatigue 09/15/2020   GERD (gastroesophageal reflux disease) 06/24/2018   Heart murmur 06/24/2018   Hyperlipidemia 06/24/2018   Night sweats 09/15/2020   Obesity, diabetes, and hypertension syndrome (HCC) 02/28/2020   S/P AVR 09/22/2018   Biological Bentall Procedure using a 25 mm Edwards Magna-Ease pericardial valve and a 28 mm Gelweave Valsalva graft.   Secondary adhesive capsulitis of left shoulder 08/31/2020   Thoracic ascending aortic aneurysm [facility])     Past Surgical History:  Procedure Laterality Date   BENTALL PROCEDURE N/A 09/10/2018   Procedure: BENTALL PROCEDURE USING HEMASHIELD 28, GELWEAVE VALSALVA , AND MAGNA EASE ;  Surgeon: [name], MD;  Location: [facility] OR;  Service: Open Heart Surgery;  Laterality: N/A;   BUBBLE STUDY  09/28/2020   Procedure: BUBBLE STUDY;  Surgeon: Parke Poisson, MD;  Location: [facility] ENDOSCOPY;  Service: Cardiology;;   CHOLECYSTECTOMY     RIGHT HEART CATH AND CORONARY ANGIOGRAPHY N/A 07/07/2018   Procedure: RIGHT HEART CATH AND CORONARY ANGIOGRAPHY;  Surgeon: Lennette Bihari, MD;  Location: [facility] INVASIVE CV LAB;  Service: Cardiovascular;  Laterality: N/A;   TEE WITHOUT CARDIOVERSION N/A  09/10/2018   Procedure: TRANSESOPHAGEAL ECHOCARDIOGRAM (TEE);  Surgeon: [name], MD;  Location: [facility] OR;  Service: Open Heart Surgery;  Laterality: N/A;   TEE WITHOUT CARDIOVERSION N/A 09/28/2020   Procedure: TRANSESOPHAGEAL ECHOCARDIOGRAM (TEE);  Surgeon: Parke Poisson, MD;  Location: [facility] ENDOSCOPY;  Service: Cardiology;  Laterality: N/A;    Family History  Problem Relation Age of Onset   Lung cancer Mother    Pulmonary disease Mother    Alcoholism Father    Anxiety disorder Father    Hyperlipidemia Father    Lung cancer Father    CAD Father    Renal cancer Father    Pulmonary disease Father     Social History:  reports that he has never smoked. He has quit using smokeless tobacco.  His smokeless tobacco use included snuff. He reports previous alcohol use of about 2.0 standard drinks of alcohol per week. He reports that he does not use drugs.  Allergies: No Known Allergies  Medications: I have reviewed the patient's current medications.  Results for orders placed or performed during the hospital encounter of 09/26/20 (from the past 48 hour(s))  Glucose, capillary     Status: Abnormal   Collection Time: 09/29/20 12:37 PM  Result Value Ref Range   Glucose-Capillary 151 (H) 70 - 99 mg/dL    Comment: Glucose reference range applies only to samples taken after fasting for at least 8 hours.  Glucose, capillary     Status: Abnormal   Collection Time: 09/29/20  5:02 PM  Result Value  Ref Range   Glucose-Capillary 227 (H) 70 - 99 mg/dL    Comment: Glucose reference range applies only to samples taken after fasting for at least 8 hours.  Glucose, capillary     Status: Abnormal   Collection Time: 09/29/20  9:19 PM  Result Value Ref Range   Glucose-Capillary 265 (H) 70 - 99 mg/dL    Comment: Glucose reference range applies only to samples taken after fasting for at least 8 hours.  Basic metabolic panel     Status: Abnormal   Collection Time: 09/30/20  2:20 AM  Result Value Ref  Range   Sodium 132 (L) 135 - 145 mmol/L   Potassium 4.1 3.5 - 5.1 mmol/L   Chloride 101 98 - 111 mmol/L   CO2 23 22 - 32 mmol/L   Glucose, Bld 238 (H) 70 - 99 mg/dL    Comment: Glucose reference range applies only to samples taken after fasting for at least 8 hours.   BUN 8 6 - 20 mg/dL   Creatinine, Ser 3.35 0.61 - 1.24 mg/dL   Calcium 8.7 (L) 8.9 - 10.3 mg/dL   GFR, Estimated >45 >62 mL/min    Comment: (NOTE) Calculated using the CKD-EPI Creatinine Equation (2021)    Anion gap 8 5 - 15    Comment: Performed at [facility], [address], [place], [place] [address]  CBC with Differential/Platelet     Status: Abnormal   Collection Time: 09/30/20  2:20 AM  Result Value Ref Range   WBC 8.2 4.0 - 10.5 K/uL   RBC 3.25 (L) 4.22 - 5.81 MIL/uL   Hemoglobin 9.6 (L) 13.0 - 17.0 g/dL   HCT 37.3 (L) 42.8 - 76.8 %   MCV 92.0 80.0 - 100.0 fL   MCH 29.5 26.0 - 34.0 pg   MCHC 32.1 30.0 - 36.0 g/dL   RDW 11.5 72.6 - 20.3 %   Platelets 398 150 - 400 K/uL   nRBC 0.0 0.0 - 0.2 %   Neutrophils Relative % 68 %   Neutro Abs 5.6 1.7 - 7.7 K/uL   Lymphocytes Relative 20 %   Lymphs Abs 1.6 0.7 - 4.0 K/uL   Monocytes Relative 9 %   Monocytes Absolute 0.8 0.1 - 1.0 K/uL   Eosinophils Relative 2 %   Eosinophils Absolute 0.1 0.0 - 0.5 K/uL   Basophils Relative 1 %   Basophils Absolute 0.1 0.0 - 0.1 K/uL   Immature Granulocytes 0 %   Abs Immature Granulocytes 0.03 0.00 - 0.07 K/uL    Comment: Performed at [facility], [address] [place], [place] [address]  Glucose, capillary     Status: Abnormal   Collection Time: 09/30/20  7:37 AM  Result Value Ref Range   Glucose-Capillary 222 (H) 70 - 99 mg/dL    Comment: Glucose reference range applies only to samples taken after fasting for at least 8 hours.  Glucose, capillary     Status: Abnormal   Collection Time: 09/30/20 11:34 AM  Result Value Ref Range   Glucose-Capillary 264 (H) 70 - 99 mg/dL    Comment: Glucose reference range  applies only to samples taken after fasting for at least 8 hours.  Glucose, capillary     Status: Abnormal   Collection Time: 09/30/20  4:14 PM  Result Value Ref Range   Glucose-Capillary 284 (H) 70 - 99 mg/dL    Comment: Glucose reference range applies only to samples taken after fasting for at least  8 hours.  Glucose, capillary     Status: Abnormal   Collection Time: 09/30/20  9:13 PM  Result Value Ref Range   Glucose-Capillary 236 (H) 70 - 99 mg/dL    Comment: Glucose reference range applies only to samples taken after fasting for at least 8 hours.  Basic metabolic panel     Status: Abnormal   Collection Time: 10/01/20  1:37 AM  Result Value Ref Range   Sodium 132 (L) 135 - 145 mmol/L   Potassium 4.1 3.5 - 5.1 mmol/L   Chloride 97 (L) 98 - 111 mmol/L   CO2 26 22 - 32 mmol/L   Glucose, Bld 208 (H) 70 - 99 mg/dL    Comment: Glucose reference range applies only to samples taken after fasting for at least 8 hours.   BUN 7 6 - 20 mg/dL   Creatinine, Ser 9.83 0.61 - 1.24 mg/dL   Calcium 9.0 8.9 - 38.2 mg/dL   GFR, Estimated >50 >53 mL/min    Comment: (NOTE) Calculated using the CKD-EPI Creatinine Equation (2021)    Anion gap 9 5 - 15    Comment: Performed at [facility], [address], [place], [place] [address]  CBC with Differential/Platelet     Status: Abnormal   Collection Time: 10/01/20  1:37 AM  Result Value Ref Range   WBC 8.1 4.0 - 10.5 K/uL   RBC 3.29 (L) 4.22 - 5.81 MIL/uL   Hemoglobin 9.9 (L) 13.0 - 17.0 g/dL   HCT 41.9 (L) 37.9 - 02.4 %   MCV 91.2 80.0 - 100.0 fL   MCH 30.1 26.0 - 34.0 pg   MCHC 33.0 30.0 - 36.0 g/dL   RDW 09.7 35.3 - 29.9 %   Platelets 357 150 - 400 K/uL   nRBC 0.0 0.0 - 0.2 %   Neutrophils Relative % 67 %   Neutro Abs 5.5 1.7 - 7.7 K/uL   Lymphocytes Relative 21 %   Lymphs Abs 1.7 0.7 - 4.0 K/uL   Monocytes Relative 8 %   Monocytes Absolute 0.6 0.1 - 1.0 K/uL   Eosinophils Relative 2 %   Eosinophils Absolute 0.1 0.0 - 0.5 K/uL    Basophils Relative 1 %   Basophils Absolute 0.1 0.0 - 0.1 K/uL   Immature Granulocytes 1 %   Abs Immature Granulocytes 0.05 0.00 - 0.07 K/uL    Comment: Performed at [facility], [address], [place], [place] [address]  Glucose, capillary     Status: Abnormal   Collection Time: 10/01/20  7:36 AM  Result Value Ref Range   Glucose-Capillary 218 (H) 70 - 99 mg/dL    Comment: Glucose reference range applies only to samples taken after fasting for at least 8 hours.    DG Orthopantogram  Result Date: 09/30/2020 CLINICAL DATA:  Dental caries.  No pain at this time. EXAM: ORTHOPANTOGRAM/PANORAMIC COMPARISON:  None. FINDINGS: Multiple lower teeth and several upper teeth are absent. No evidence of fracture. No focal lytic or sclerotic lesion. IMPRESSION: No acute findings. Electronically Signed   By: Elberta Fortis M.D.   On: 09/30/2020 14:08   CT CORONARY MORPH W/CTA COR W/SCORE W/CA W/CM &/OR WO/CM  Addendum Date: 09/29/2020   ADDENDUM REPORT: 09/29/2020 18:43 CLINICAL DATA:  Bacteremia, concern for perivalvular abscess. 55 yo male with bacteremia and fevers, with history of bicuspid aortic valve stenosis and ascending aortic aneurysm, s/p Biological Bentall Procedure using a 25 mm Edwards Magna-Ease pericardial valve and a 28  mm Gelweave Valsalva graft, as well as replacement of the ascending aorta (hemi-arch) using a 28 mm Hemashield graft on 09/10/2018 with Dr. Laneta Simmers. COMPARISON INTRAOPERATIVE TEE 09/10/2018, CTA CHEST AORTA 10/20/19, TRANSTHORACIC ECHO 04/03/20. EXAM: Cardiac ECG gated CT angiogram TECHNIQUE: The patient was scanned on a Siemens Force 192 slice scanner. A 120 kV retrospective scan was triggered in the descending thoracic aorta at 111 HU's. Gantry rotation speed was 270 msecs and collimation was .9 mm. The 3D data set was reconstructed in 5% intervals of the R-R cycle. Systolic and diastolic phases were analyzed on a dedicated work station using MPR, MIP and VRT modes. The  patient received OMNIPAQUE IOHEXOL 350 MG/ML SOLN of contrast. Nitroglycerin could not be administered due to hypotension. FINDINGS: AORTIC VALVE: There is a bioprosthesis in the aortic position, as part of a valved conduit. Posterior to the valve, there is a 3 x 3 x 3 cm area of mixed attenuation, with low density centrally and relative peripheral enhancement, consistent with perivalvular abscess. This finding was not present on CTA chest performed 10/20/19, and is not seen on immediately post-operative TEE. There is no contrast opacification of this space, and no evidence of peri-graft pseudoaneurysm or fistula. The prosthetic valve leaflets are normal with normal excursion. AORTA: The sinus of Valsalva and ascending aorta have been replaced. There is thickening and fluid density consistent with extension of above noted periprosthetic abscess. The ascending aorta graft otherwise appears stable. The ascending aorta distal to the graft is normal caliber, 33 mm at distal ascending aorta just before the common origin of common carotid arteries. CORONARY ARTERIES: Study was not performed to evaluate coronary arteries. Nitroglycerin was not given due to hypotension. The left main coronary artery is encircled by mixed attenuation mass consistent with abscess. No communication with coronary artery seen. LM is free of disease and is not compressed by abscess. Left dominant circulation. Calcifications in LAD and L circumflex. Proximal LAD and proximal L circumflex do not appear to have severe stenosis. RCA is small and appears patent. OTHER: Mild dilation of main pulmonary artery, 30 mm. No left atrial appendage thrombus. Normal pulmonary vein drainage into the left atrium. IMPRESSION: 1. Findings consistent with perivalvular abscess posterior to the aortic valve bioprosthesis. The left main coronary artery is patent and traverses this area of mixed attenuation without compression of the left main coronary artery.  Findings communicated to the cardiology consult service. Recommend cardiac surgery consult. Electronically Signed   By: Weston Brass   On: 09/29/2020 18:43   Result Date: 09/29/2020 EXAM: OVER-READ INTERPRETATION  CT CHEST The following report is an over-read performed by radiologist Dr. Corlis Leak of [facility] on 09/29/2020. This over-read does not include interpretation of cardiac or coronary anatomy or pathology. The coronary calcium score/coronary CTA interpretation by the cardiologist is dictated separately. COMPARISON:  10/20/2019 FINDINGS: Vascular: Heart size normal. Fair contrast opacification of pulmonary artery branches; the exam was not optimized for detection of pulmonary emboli. Coronary calcifications. Previous AVR. Stable ascending aortic tube graft repair. Good contrast opacification of the thoracic aorta without dissection, aneurysm, or stenosis. Common origin of common carotid arteries with some eccentric partially calcified plaque, no high-grade stenosis. Left subclavian artery widely patent. Aberrant origin of the right subclavian artery which takes a retroesophageal course. Mediastinum/Nodes: No mass or adenopathy. Lungs/Pleura: No pleural effusion. No pneumothorax. Lungs are clear. Upper Abdomen: No acute findings. Musculoskeletal: Sternotomy wires. Anterior vertebral endplate spurring at multiple levels in the lower thoracic spine.  No fracture or worrisome bone lesion. IMPRESSION: 1. No acute findings. 2. Stable changes of Bentall repair. 3. Coronary and Aortic Atherosclerosis (ICD10-170.0). 4. Aberrant origin of right subclavian artery, an anatomic variant. Electronically Signed: By: Corlis Leak  Hassell M.D. On: 09/29/2020 15:14    ROS Blood pressure 99/65, pulse 76, temperature 99.3 F (37.4 C), temperature source Oral, resp. rate 18, height 5\' 8"  (1.727 m), weight 84.6 kg, SpO2 99 %. General appearance: alert, cooperative, and no distress Head: Normocephalic, without  obvious abnormality, atraumatic Eyes: conjunctivae/corneas clear. PERRL, EOM's intact. Fundi benign. Nose: Nares normal. Septum midline. Mucosa normal. No drainage or sinus tenderness. Throat: Multiple missing teeth. Generalized gingival recession, exposed palatal roots. No edema, purulence, fluctuance, or dental mobility. No frank dental caries. Neck: no adenopathy  Assessment/Plan: No obvious oral etiology for endocarditis. Patient will need removal of remaining teeth at some point when he has stabilized.   Ocie DoyneScott Aristidis Talerico 10/01/2020, 11:10 AM

## 2020-10-01 NOTE — Progress Notes (Signed)
PROGRESS NOTE    Ronnie Ruiz  EAV:[id number] DOB: [birth year] DOA: 09/26/2020 PCP: [name], MD    Brief Narrative:  Ronnie Ruiz is a 55 year old male with past medical history significant for essential hypertension, hyperlipidemia, type 2 diabetes mellitus, history of by cuspid aortic valve s/p bioprosthetic AVR 2020 who was found to have HAECK organism bacteremia at [facility] and was transferred to [facility] on 6/7 for TEE and ID consultation.  Patient was transferred from the cardiology service to [facility] on 09/28/2020.   Assessment & Plan:   Principal Problem:   Endocarditis Active Problems:   Hyperlipidemia   Essential hypertension   Alcohol dependency (HCC)   Heart murmur   Bicuspid aortic valve   S/P AVR   Fatigue   Bacteremia   Protein-calorie malnutrition, severe   Dental caries   Aortic valve abscess   Aggregatobacter (HAECK) septicemia, POA Patient initially presented to [facility] with fever, weakness and fatigue.  He was originally seen by outpatient cardiology on 09/15/2020 by Dr. Tomie China with reports of night sweats and fatigue and recommended to undergo a echocardiogram, blood cultures given AVR.  Patient was reported to have positive blood cultures and sent to the ED for further management.  Blood cultures from 09/15/2020 reportedly positive for aggregatibacter (HAECK organism), which is felt to be secondary to his poor dentition.  He was started on ceftriaxone on 09/20/2020.  TTE was unrevealing with LVEF 60 to 65%, mild LAE, mild MR/TR, and AV s/p replacement with mean gradient 14 mmHg, but cannot rule out endocarditis.  He was seen by Dr. Bing Matter in consultation given history of AVR, was recommended to transfer to [facility] for TEE and ID consultation.  TEE on 09/29/2018 with no definite evidence of acute endocarditis or valvular abscess, however some findings cannot be clearly distinguished from postoperative change.  CT coronary 09/29/2020  with findings consistent with perivalvular abscess posterior to the aortic valve bioprosthesis. --Cardiology and infectious disease following, appreciate assistance --TMAX 99.3 past 24h --Repeat blood cultures 6/7: No growth x 4 days --Ceftriaxone 2 g IV every 24 hours --CBC daily, monitor fever curve  Severe AS with bicuspid aortic valve s/p bioprosthetic AVR 2020; now with perivalvular abscess CT coronary 09/29/2020 with findings consistent with perivalvular abscess posterior to the aortic valve bioprosthesis. --TCTS following; anticipate need for replacement of prosthetic material with a homograft root  --Continue antibiotics as above per ID.  Poor dentition Seen by oral surgery, Dr. Barbette Merino on 10/01/2020.   Essential hypertension Patient was noted to have intermittent hypotension at [facility].  On lisinopril and metoprolol at home. --BP 106/69 this morning --Continue metoprolol tartrate 25 mg p.o. twice daily --Continue to hold home lisinopril for now --Continue aspirin and statin --Continue monitor BP closely  Hyperlipidemia --Continue simvastatin 40 mg p.o. daily  Type 2 diabetes mellitus, with hyperglycemia Hemoglobin A1c 9.2 on 09/19/2020, poorly controlled.  On metformin 500 mg p.o. twice daily outpatient. --Hold home metformin while inpatient; would benefit from increase to  twice daily on discharge --Lantus 20 units subcutaneously daily --SSI for coverage --CBGs before every meal/at bedtime --Diabetic educator consult  GERD: Continue PPI  Severe protein calorie malnutrition Body mass index is 28.34 kg/m.  Notable moderate muscle depletion and 9% weight loss in 1 month. Nutrition Status: Nutrition Problem: Severe Malnutrition Etiology: acute illness (endocarditis) Signs/Symptoms: moderate muscle depletion, percent weight loss (9% weight loss within 1 month) Percent weight loss: 9 % Interventions: MVI, Other (Comment) (Ensure Max) --  Dietitian following,  appreciate assistance. --Continue to encourage increased oral intake, supplements    DVT prophylaxis: heparin injection 5,000 Units Start: 09/26/20 2300    Code Status: Full Code Family Communication: None present at bedside, updated patient's spouse Junious Dresser via telephone this yesterday  Disposition Plan:  Level of care: Telemetry Medical Status is: Inpatient  Remains inpatient appropriate because:Ongoing diagnostic testing needed not appropriate for outpatient work up, Unsafe d/c plan, IV treatments appropriate due to intensity of illness or inability to take PO, and Inpatient level of care appropriate due to severity of illness  Dispo: The patient is from: Home              Anticipated d/c is to: Home              Patient currently is not medically stable to d/c.   Difficult to place patient No  Consultants:  Cardiology Infectious disease Cardiothoracic surgery Oral surgery, Dr. Barbette Merino  Procedures:  TTE TEE Gated cardiac CTA  Antimicrobials:  Ceftriaxone   Subjective: Patient seen examined bedside, resting comfortably.  No specific complaints this morning.  Seen by oral surgeon, Dr. Barbette Merino this morning.  Denies headache, no dizziness, no chest pain, palpitations, no shortness of breath, no fever/chills/night sweats, no nausea/vomiting/diarrhea, no weakness, no fatigue, no paresthesias.  No acute events overnight per nursing staff.  Objective: Vitals:   09/30/20 2024 10/01/20 0040 10/01/20 0449 10/01/20 1115  BP: 121/71 99/69 99/65  106/69  Pulse: 97 85 76 83  Resp: 15 16 18 18   Temp: 99 F (37.2 C) 99 F (37.2 C) 99.3 F (37.4 C) 97.8 F (36.6 C)  TempSrc: Oral Oral Oral Oral  SpO2: 97% 98% 99%   Weight:      Height:        Intake/Output Summary (Last 24 hours) at 10/01/2020 1126 Last data filed at 09/30/2020 1910 Gross per 24 hour  Intake 720 ml  Output --  Net 720 ml   Filed Weights   09/26/20 2121 09/27/20 0335 09/28/20 0333  Weight: 86 kg 85 kg 84.6  kg    Examination:  General exam: Appears calm and comfortable, poor dentition, moderate muscle wasting and fat depletion noted Respiratory system: Clear to auscultation. Respiratory effort normal.  On room air Cardiovascular system: S1 & S2 heard, RRR. No JVD, murmurs, rubs, gallops or clicks. No pedal edema. Gastrointestinal system: Abdomen is nondistended, soft and nontender. No organomegaly or masses felt. Normal bowel sounds heard. Central nervous system: Alert and oriented. No focal neurological deficits. Extremities: Symmetric 5 x 5 power. Skin: No rashes, lesions or ulcers Psychiatry: Judgement and insight appear normal. Mood & affect appropriate.     Data Reviewed: I have personally reviewed following labs and imaging studies  CBC: Recent Labs  Lab 09/27/20 0821 09/28/20 0353 09/29/20 0417 09/30/20 0220 10/01/20 0137  WBC 7.5 11.0* 10.2 8.2 8.1  NEUTROABS 4.8 7.4 7.0 5.6 5.5  HGB 10.2* 10.2* 10.5* 9.6* 9.9*  HCT 31.6* 31.1* 32.6* 29.9* 30.0*  MCV 92.9 92.8 92.6 92.0 91.2  PLT 405* 390 385 398 357   Basic Metabolic Panel: Recent Labs  Lab 09/26/20 2223 09/27/20 0821 09/28/20 0353 09/29/20 0417 09/30/20 0220 10/01/20 0137  NA 132* 133* 133* 135 132* 132*  K 4.1 4.4 3.8 4.4 4.1 4.1  CL 98 100 99 102 101 97*  CO2 26 24 26 25 23 26   GLUCOSE 222* 162* 195* 214* 238* 208*  BUN 11 10 10 11 8 7   CREATININE 0.78  0.66 0.70 0.72 0.63 0.71  CALCIUM 8.9 9.0 9.1 9.1 8.7* 9.0  MG 1.9  --   --   --   --   --    GFR: Estimated Creatinine Clearance: 109.2 mL/min (by C-G formula based on SCr of 0.71 mg/dL). Liver Function Tests: Recent Labs  Lab 09/26/20 2223  AST 24  ALT 25  ALKPHOS 60  BILITOT 0.4  PROT 6.7  ALBUMIN 2.5*   No results for input(s): LIPASE, AMYLASE in the last 168 hours. No results for input(s): AMMONIA in the last 168 hours. Coagulation Profile: Recent Labs  Lab 09/26/20 2223  INR 1.0   Cardiac Enzymes: No results for input(s): CKTOTAL,  CKMB, CKMBINDEX, TROPONINI in the last 168 hours. BNP (last 3 results) No results for input(s): PROBNP in the last 8760 hours. HbA1C: No results for input(s): HGBA1C in the last 72 hours. CBG: Recent Labs  Lab 09/30/20 1134 09/30/20 1614 09/30/20 2113 10/01/20 0736 10/01/20 1113  GLUCAP 264* 284* 236* 218* 256*   Lipid Profile: No results for input(s): CHOL, HDL, LDLCALC, TRIG, CHOLHDL, LDLDIRECT in the last 72 hours. Thyroid Function Tests: No results for input(s): TSH, T4TOTAL, FREET4, T3FREE, THYROIDAB in the last 72 hours. Anemia Panel: No results for input(s): VITAMINB12, FOLATE, FERRITIN, TIBC, IRON, RETICCTPCT in the last 72 hours. Sepsis Labs: No results for input(s): PROCALCITON, LATICACIDVEN in the last 168 hours.  Recent Results (from the past 240 hour(s))  Culture, blood (routine x 2)     Status: None (Preliminary result)   Collection Time: 09/26/20 11:48 PM   Specimen: BLOOD  Result Value Ref Range Status   Specimen Description BLOOD SITE NOT SPECIFIED  Final   Special Requests   Final    BOTTLES DRAWN AEROBIC AND ANAEROBIC Blood Culture results may not be optimal due to an excessive volume of blood received in culture bottles   Culture   Final    NO GROWTH 4 DAYS Performed at [facility], [address] [place], [place] [address]    Report Status PENDING  Incomplete  Culture, blood (routine x 2)     Status: None (Preliminary result)   Collection Time: 09/26/20 11:48 PM   Specimen: BLOOD  Result Value Ref Range Status   Specimen Description BLOOD SITE NOT SPECIFIED  Final   Special Requests   Final    BOTTLES DRAWN AEROBIC AND ANAEROBIC Blood Culture adequate volume   Culture   Final    NO GROWTH 4 DAYS Performed at [facility], [address] [place], [place] [address]    Report Status PENDING  Incomplete  SARS CORONAVIRUS 2 (TAT 6-24 HRS) Nasopharyngeal Nasopharyngeal Swab     Status: None   Collection Time: 09/27/20  3:20 AM    Specimen: Nasopharyngeal Swab  Result Value Ref Range Status   SARS Coronavirus 2 NEGATIVE NEGATIVE Final    Comment: (NOTE) SARS-CoV-2 target nucleic acids are NOT DETECTED.  The SARS-CoV-2 RNA is generally detectable in upper and lower respiratory specimens during the acute phase of infection. Negative results do not preclude SARS-CoV-2 infection, do not rule out co-infections with other pathogens, and should not be used as the sole basis for treatment or other patient management decisions. Negative results must be combined with clinical observations, patient history, and epidemiological information. The expected result is Negative.  Fact Sheet for Patients: HairSlick.nohttps://www.fda.gov/media/138098/download  Fact Sheet for Healthcare Providers: quierodirigir.comhttps://www.fda.gov/media/138095/download  This test is not yet approved or cleared by the [place]  FDA and  has been authorized for detection and/or diagnosis of SARS-CoV-2 by FDA under an Emergency Use Authorization (EUA). This EUA will remain  in effect (meaning this test can be used) for the duration of the COVID-19 declaration under Se ction 564(b)(1) of the Act, 21 U.S.C. section 360bbb-3(b)(1), unless the authorization is terminated or revoked sooner.  Performed at [facility], [address] [place], [place] [address]          Radiology Studies: DG Orthopantogram  Result Date: 09/30/2020 CLINICAL DATA:  Dental caries.  No pain at this time. EXAM: ORTHOPANTOGRAM/PANORAMIC COMPARISON:  None. FINDINGS: Multiple lower teeth and several upper teeth are absent. No evidence of fracture. No focal lytic or sclerotic lesion. IMPRESSION: No acute findings. Electronically Signed   By: Elberta Fortis M.D.   On: 09/30/2020 14:08   CT CORONARY MORPH W/CTA COR W/SCORE W/CA W/CM &/OR WO/CM  Addendum Date: 09/29/2020   ADDENDUM REPORT: 09/29/2020 18:43 CLINICAL DATA:  Bacteremia, concern for perivalvular abscess. 55 yo male with  bacteremia and fevers, with history of bicuspid aortic valve stenosis and ascending aortic aneurysm, s/p Biological Bentall Procedure using a 25 mm Edwards Magna-Ease pericardial valve and a 28 mm Gelweave Valsalva graft, as well as replacement of the ascending aorta (hemi-arch) using a 28 mm Hemashield graft on 09/10/2018 with Dr. Laneta Simmers. COMPARISON INTRAOPERATIVE TEE 09/10/2018, CTA CHEST AORTA 10/20/19, TRANSTHORACIC ECHO 04/03/20. EXAM: Cardiac ECG gated CT angiogram TECHNIQUE: The patient was scanned on a Siemens Force 192 slice scanner. A 120 kV retrospective scan was triggered in the descending thoracic aorta at 111 HU's. Gantry rotation speed was 270 msecs and collimation was .9 mm. The 3D data set was reconstructed in 5% intervals of the R-R cycle. Systolic and diastolic phases were analyzed on a dedicated work station using MPR, MIP and VRT modes. The patient received OMNIPAQUE IOHEXOL 350 MG/ML SOLN of contrast. Nitroglycerin could not be administered due to hypotension. FINDINGS: AORTIC VALVE: There is a bioprosthesis in the aortic position, as part of a valved conduit. Posterior to the valve, there is a 3 x 3 x 3 cm area of mixed attenuation, with low density centrally and relative peripheral enhancement, consistent with perivalvular abscess. This finding was not present on CTA chest performed 10/20/19, and is not seen on immediately post-operative TEE. There is no contrast opacification of this space, and no evidence of peri-graft pseudoaneurysm or fistula. The prosthetic valve leaflets are normal with normal excursion. AORTA: The sinus of Valsalva and ascending aorta have been replaced. There is thickening and fluid density consistent with extension of above noted periprosthetic abscess. The ascending aorta graft otherwise appears stable. The ascending aorta distal to the graft is normal caliber, 33 mm at distal ascending aorta just before the common origin of common carotid arteries. CORONARY  ARTERIES: Study was not performed to evaluate coronary arteries. Nitroglycerin was not given due to hypotension. The left main coronary artery is encircled by mixed attenuation mass consistent with abscess. No communication with coronary artery seen. LM is free of disease and is not compressed by abscess. Left dominant circulation. Calcifications in LAD and L circumflex. Proximal LAD and proximal L circumflex do not appear to have severe stenosis. RCA is small and appears patent. OTHER: Mild dilation of main pulmonary artery, 30 mm. No left atrial appendage thrombus. Normal pulmonary vein drainage into the left atrium. IMPRESSION: 1. Findings consistent with perivalvular abscess posterior to the aortic valve bioprosthesis. The left main coronary artery is  patent and traverses this area of mixed attenuation without compression of the left main coronary artery. Findings communicated to the cardiology consult service. Recommend cardiac surgery consult. Electronically Signed   By: Weston Brass   On: 09/29/2020 18:43   Result Date: 09/29/2020 EXAM: OVER-READ INTERPRETATION  CT CHEST The following report is an over-read performed by radiologist Dr. Corlis Leak of [facility] on 09/29/2020. This over-read does not include interpretation of cardiac or coronary anatomy or pathology. The coronary calcium score/coronary CTA interpretation by the cardiologist is dictated separately. COMPARISON:  10/20/2019 FINDINGS: Vascular: Heart size normal. Fair contrast opacification of pulmonary artery branches; the exam was not optimized for detection of pulmonary emboli. Coronary calcifications. Previous AVR. Stable ascending aortic tube graft repair. Good contrast opacification of the thoracic aorta without dissection, aneurysm, or stenosis. Common origin of common carotid arteries with some eccentric partially calcified plaque, no high-grade stenosis. Left subclavian artery widely patent. Aberrant origin of the right  subclavian artery which takes a retroesophageal course. Mediastinum/Nodes: No mass or adenopathy. Lungs/Pleura: No pleural effusion. No pneumothorax. Lungs are clear. Upper Abdomen: No acute findings. Musculoskeletal: Sternotomy wires. Anterior vertebral endplate spurring at multiple levels in the lower thoracic spine. No fracture or worrisome bone lesion. IMPRESSION: 1. No acute findings. 2. Stable changes of Bentall repair. 3. Coronary and Aortic Atherosclerosis (ICD10-170.0). 4. Aberrant origin of right subclavian artery, an anatomic variant. Electronically Signed: By: Corlis Leak M.D. On: 09/29/2020 15:14        Scheduled Meds:  aspirin EC  81 mg Oral Daily   atorvastatin  80 mg Oral Daily   docusate sodium  100 mg Oral BID   heparin  5,000 Units Subcutaneous Q8H   insulin aspart  0-15 Units Subcutaneous TID WC   insulin aspart  0-5 Units Subcutaneous QHS   insulin glargine  20 Units Subcutaneous Daily   metoprolol tartrate  25 mg Oral BID   multivitamin with minerals  1 tablet Oral Daily   pantoprazole  40 mg Oral Daily   Ensure Max Protein  11 oz Oral BID   Continuous Infusions:  cefTRIAXone (ROCEPHIN)  IV 2 g (10/01/20 0900)     LOS: 5 days    Time spent: 38 minutes spent on chart review, discussion with nursing staff, consultants, updating family and interview/physical exam; more than 50% of that time was spent in counseling and/or coordination of care.    Alvira Philips [place], DO [facility] Available via Epic secure chat 7am-7pm After these hours, please refer to coverage provider listed on [email or web address] 10/01/2020, 11:26 AM

## 2020-10-01 NOTE — Progress Notes (Signed)
3 Days Post-Op Procedure(s) (LRB): TRANSESOPHAGEAL ECHOCARDIOGRAM (TEE) (N/A) BUBBLE STUDY Subjective: No complaints, denies CP  Objective: Vital signs in last 24 hours: Temp:  [97.8 F (36.6 C)-99.3 F (37.4 C)] 97.8 F (36.6 C) (06/12 1115) Pulse Rate:  [76-97] 83 (06/12 1115) Cardiac Rhythm: Normal sinus rhythm (06/12 0830) Resp:  [15-18] 18 (06/12 1115) BP: (99-121)/(65-71) 106/69 (06/12 1115) SpO2:  [97 %-99 %] 99 % (06/12 0449)  Hemodynamic parameters for last 24 hours:    Intake/Output from previous day: 06/11 0701 - 06/12 0700 In: 1080 [P.O.:1080] Out: -  Intake/Output this shift: No intake/output data recorded.  General appearance: alert, cooperative, and no distress Neurologic: intact Heart: regular rate and rhythm Lungs: clear to auscultation bilaterally  Lab Results: Recent Labs    09/30/20 0220 10/01/20 0137  WBC 8.2 8.1  HGB 9.6* 9.9*  HCT 29.9* 30.0*  PLT 398 357   BMET:  Recent Labs    09/30/20 0220 10/01/20 0137  NA 132* 132*  K 4.1 4.1  CL 101 97*  CO2 23 26  GLUCOSE 238* 208*  BUN 8 7  CREATININE 0.63 0.71  CALCIUM 8.7* 9.0    PT/INR: No results for input(s): LABPROT, INR in the last 72 hours. ABG    Component Value Date/Time   PHART 7.383 09/11/2018 0239   HCO3 22.9 09/11/2018 0239   TCO2 27 09/11/2018 1643   ACIDBASEDEF 2.0 09/11/2018 0239   O2SAT 96.0 09/11/2018 0239   CBG (last 3)  Recent Labs    09/30/20 2113 10/01/20 0736 10/01/20 1113  GLUCAP 236* 218* 256*    Assessment/Plan: S/P Procedure(s) (LRB): TRANSESOPHAGEAL ECHOCARDIOGRAM (TEE) (N/A) BUBBLE STUDY Some PVCs on monitor this morning. 12 lead ECG done- I reviewed hard copy but for some reason result not loaded into Epic yet. It showed normal sinus rhythm with no concerning findings of ischemia or heart block. PR 0.15 Continue IV antibiotics Dr. Laneta Simmers returns tomorrow and can weigh in on possible need for surgery and timing   LOS: 5 days    Loreli Slot 10/01/2020

## 2020-10-01 NOTE — Plan of Care (Signed)
Pt A&OX4, compliant with meds and treatment. No c/o pain or and s/s of distress. BP 99/65 (BP Location: Left Arm)   Pulse 76   Temp 99.3 F (37.4 C) (Oral)   Resp 18   Ht 5\' 8"  (1.727 m)   Wt 84.6 kg   SpO2 99%   BMI 28.34 kg/m   No other needs voiced at this time. 

## 2020-10-01 NOTE — Progress Notes (Signed)
PT A&OX4, pt compliant with meds, received 5 of Novolog. Pt hooked up to IV Antibiotics. No c/o pain at this time. Pt was visited by Dr. Marisue Brooklyn the process of a surgeon consult for ortho surgery. Pt call bell near by, bed in lowest position and bed alarm on. No needs voiced at this time. BP 99/65 (BP Location: Left Arm)   Pulse 76   Temp 99.3 F (37.4 C) (Oral)   Resp 18   Ht 5\' 8"  (1.727 m)   Wt 84.6 kg   SpO2 99%   BMI 28.34 kg/m   EKG ordered and performed by NT 

## 2020-10-01 NOTE — Progress Notes (Addendum)
Progress Note  Patient Name: Ronnie Ruiz Date of Encounter: 10/01/2020  [facility] Cardiologist: Gypsy Balsamobert Krasowski, MD   Subjective  Coronary CTA showed perivalvular abscess posterior to the AVR bioprosthesis with patent LM that traverses the area of mixed attenuation without compression of the LMCA.    He denies any chest pain or SOB.  No arrhythmias on tele  Inpatient Medications    Scheduled Meds:  aspirin EC  81 mg Oral Daily   atorvastatin  80 mg Oral Daily   docusate sodium  100 mg Oral BID   heparin  5,000 Units Subcutaneous Q8H   insulin aspart  0-15 Units Subcutaneous TID WC   insulin aspart  0-5 Units Subcutaneous QHS   insulin glargine  20 Units Subcutaneous Daily   metoprolol tartrate  25 mg Oral BID   multivitamin with minerals  1 tablet Oral Daily   pantoprazole  40 mg Oral Daily   Ensure Max Protein  11 oz Oral BID   Continuous Infusions:  cefTRIAXone (ROCEPHIN)  IV 2 g (09/30/20 0854)   PRN Meds: acetaminophen, bisacodyl, magnesium hydroxide, nitroGLYCERIN, ondansetron (ZOFRAN) IV, zolpidem   Vital Signs    Vitals:   09/30/20 1616 09/30/20 2024 10/01/20 0040 10/01/20 0449  BP: 119/69 121/71 99/69 99/65   Pulse: 86 97 85 76  Resp: 15 15 16 18   Temp: 99 F (37.2 C) 99 F (37.2 C) 99 F (37.2 C) 99.3 F (37.4 C)  TempSrc: Oral Oral Oral Oral  SpO2: 97% 97% 98% 99%  Weight:      Height:        Intake/Output Summary (Last 24 hours) at 10/01/2020 0805 Last data filed at 09/30/2020 1910 Gross per 24 hour  Intake 1080 ml  Output --  Net 1080 ml    Last 3 Weights 09/28/2020 09/27/2020 09/26/2020  Weight (lbs) 186 lb 6.4 oz 187 lb 8 oz 189 lb 9.6 oz  Weight (kg) 84.55 kg 85.049 kg 86.002 kg      Telemetry    NSR with occasional PVCs-  Personally Reviewed  ECG  No new EKG to review- Personally Reviewed  Physical Exam   GEN: Well nourished, well developed in no acute distress HEENT: Normal NECK: No JVD; No carotid bruits LYMPHATICS: No  lymphadenopathy CARDIAC:RRR, no rubs, gallops, 2/6 SM at RUSB to LUSB RESPIRATORY:  Clear to auscultation without rales, wheezing or rhonchi  ABDOMEN: Soft, non-tender, non-distended MUSCULOSKELETAL:  No edema; No deformity  SKIN: Warm and dry NEUROLOGIC:  Alert and oriented x 3 PSYCHIATRIC:  Normal affect   Labs    High Sensitivity Troponin:  No results for input(s): TROPONINIHS in the last 720 hours.    Chemistry Recent Labs  Lab 09/26/20 2223 09/27/20 0821 09/29/20 0417 09/30/20 0220 10/01/20 0137  NA 132*   < > 135 132* 132*  K 4.1   < > 4.4 4.1 4.1  CL 98   < > 102 101 97*  CO2 26   < > 25 23 26   GLUCOSE 222*   < > 214* 238* 208*  BUN 11   < > 11 8 7   CREATININE 0.78   < > 0.72 0.63 0.71  CALCIUM 8.9   < > 9.1 8.7* 9.0  PROT 6.7  --   --   --   --   ALBUMIN 2.5*  --   --   --   --   AST 24  --   --   --   --  ALT 25  --   --   --   --   ALKPHOS 60  --   --   --   --   BILITOT 0.4  --   --   --   --   GFRNONAA >60   < > >60 >60 >60  ANIONGAP 8   < > 8 8 9    < > = values in this interval not displayed.      Hematology Recent Labs  Lab 09/29/20 0417 09/30/20 0220 10/01/20 0137  WBC 10.2 8.2 8.1  RBC 3.52* 3.25* 3.29*  HGB 10.5* 9.6* 9.9*  HCT 32.6* 29.9* 30.0*  MCV 92.6 92.0 91.2  MCH 29.8 29.5 30.1  MCHC 32.2 32.1 33.0  RDW 14.0 13.9 13.7  PLT 385 398 357     BNPNo results for input(s): BNP, PROBNP in the last 168 hours.   DDimer No results for input(s): DDIMER in the last 168 hours.   Radiology    DG Orthopantogram  Result Date: 09/30/2020 CLINICAL DATA:  Dental caries.  No pain at this time. EXAM: ORTHOPANTOGRAM/PANORAMIC COMPARISON:  None. FINDINGS: Multiple lower teeth and several upper teeth are absent. No evidence of fracture. No focal lytic or sclerotic lesion. IMPRESSION: No acute findings. Electronically Signed   By: 11/30/2020 M.D.   On: 09/30/2020 14:08   CT CORONARY MORPH W/CTA COR W/SCORE W/CA W/CM &/OR WO/CM  Addendum Date:  09/29/2020   ADDENDUM REPORT: 09/29/2020 18:43 CLINICAL DATA:  Bacteremia, concern for perivalvular abscess. 55 yo male with bacteremia and fevers, with history of bicuspid aortic valve stenosis and ascending aortic aneurysm, s/p Biological Bentall Procedure using a 25 mm Edwards Magna-Ease pericardial valve and a 28 mm Gelweave Valsalva graft, as well as replacement of the ascending aorta (hemi-arch) using a 28 mm Hemashield graft on 09/10/2018 with Dr. 09/12/2018. COMPARISON INTRAOPERATIVE TEE 09/10/2018, CTA CHEST AORTA 10/20/19, TRANSTHORACIC ECHO 04/03/20. EXAM: Cardiac ECG gated CT angiogram TECHNIQUE: The patient was scanned on a Siemens Force 192 slice scanner. A 120 kV retrospective scan was triggered in the descending thoracic aorta at 111 HU's. Gantry rotation speed was 270 msecs and collimation was .9 mm. The 3D data set was reconstructed in 5% intervals of the R-R cycle. Systolic and diastolic phases were analyzed on a dedicated work station using MPR, MIP and VRT modes. The patient received 04/05/20 OMNIPAQUE IOHEXOL 350 MG/ML SOLN of contrast. Nitroglycerin could not be administered due to hypotension. FINDINGS: AORTIC VALVE: There is a bioprosthesis in the aortic position, as part of a valved conduit. Posterior to the valve, there is a 3 x 3 x 3 cm area of mixed attenuation, with low density centrally and relative peripheral enhancement, consistent with perivalvular abscess. This finding was not present on CTA chest performed 10/20/19, and is not seen on immediately post-operative TEE. There is no contrast opacification of this space, and no evidence of peri-graft pseudoaneurysm or fistula. The prosthetic valve leaflets are normal with normal excursion. AORTA: The sinus of Valsalva and ascending aorta have been replaced. There is thickening and fluid density consistent with extension of above noted periprosthetic abscess. The ascending aorta graft otherwise appears stable. The ascending aorta distal to the  graft is normal caliber, 33 mm at distal ascending aorta just before the common origin of common carotid arteries. CORONARY ARTERIES: Study was not performed to evaluate coronary arteries. Nitroglycerin was not given due to hypotension. The left main coronary artery is encircled by mixed attenuation mass consistent with abscess.  No communication with coronary artery seen. LM is free of disease and is not compressed by abscess. Left dominant circulation. Calcifications in LAD and L circumflex. Proximal LAD and proximal L circumflex do not appear to have severe stenosis. RCA is small and appears patent. OTHER: Mild dilation of main pulmonary artery, 30 mm. No left atrial appendage thrombus. Normal pulmonary vein drainage into the left atrium. IMPRESSION: 1. Findings consistent with perivalvular abscess posterior to the aortic valve bioprosthesis. The left main coronary artery is patent and traverses this area of mixed attenuation without compression of the left main coronary artery. Findings communicated to the cardiology consult service. Recommend cardiac surgery consult. Electronically Signed   By: Weston Brass   On: 09/29/2020 18:43   Result Date: 09/29/2020 EXAM: OVER-READ INTERPRETATION  CT CHEST The following report is an over-read performed by radiologist Dr. Corlis Leak of [facility] on 09/29/2020. This over-read does not include interpretation of cardiac or coronary anatomy or pathology. The coronary calcium score/coronary CTA interpretation by the cardiologist is dictated separately. COMPARISON:  10/20/2019 FINDINGS: Vascular: Heart size normal. Fair contrast opacification of pulmonary artery branches; the exam was not optimized for detection of pulmonary emboli. Coronary calcifications. Previous AVR. Stable ascending aortic tube graft repair. Good contrast opacification of the thoracic aorta without dissection, aneurysm, or stenosis. Common origin of common carotid arteries with some  eccentric partially calcified plaque, no high-grade stenosis. Left subclavian artery widely patent. Aberrant origin of the right subclavian artery which takes a retroesophageal course. Mediastinum/Nodes: No mass or adenopathy. Lungs/Pleura: No pleural effusion. No pneumothorax. Lungs are clear. Upper Abdomen: No acute findings. Musculoskeletal: Sternotomy wires. Anterior vertebral endplate spurring at multiple levels in the lower thoracic spine. No fracture or worrisome bone lesion. IMPRESSION: 1. No acute findings. 2. Stable changes of Bentall repair. 3. Coronary and Aortic Atherosclerosis (ICD10-170.0). 4. Aberrant origin of right subclavian artery, an anatomic variant. Electronically Signed: By: Corlis Leak M.D. On: 09/29/2020 15:14    Cardiac Studies   TEE pending final read 29-Sep-2020: Informal echo report: Normal biventricular chamber size and function.   Normal aortic bioprosthesis with normal function and no regurgitation or paravalvular leak. No dehiscense or rocking. Prominent post operative changes around valve posteriorly and aortic graft, There is an echo free space posterior and lateral to the prosthesis, which appears contiguous with the native sinus of Valsalva, without flow demonstrated. This was also possibly present on CT from 2021, and may not be an acute finding. If there is a strong clinical suspicion for endocarditis, wll consider gated cardiac CTA of the valve and aortic root to visualize this area well in the setting of fevers and bacteremia.    Coronary CTA 09/29/2020 CORONARY ARTERIES: Study was not performed to evaluate coronary arteries. Nitroglycerin was not given due to hypotension.   The left main coronary artery is encircled by mixed attenuation mass consistent with abscess. No communication with coronary artery seen. LM is free of disease and is not compressed by abscess.   Left dominant circulation. Calcifications in LAD and L circumflex. Proximal LAD and proximal L  circumflex do not appear to have severe stenosis. RCA is small and appears patent.   OTHER:   Mild dilation of main pulmonary artery, 30 mm.   No left atrial appendage thrombus.   Normal pulmonary vein drainage into the left atrium.   IMPRESSION: 1. Findings consistent with perivalvular abscess posterior to the aortic valve bioprosthesis. The left main coronary artery is patent and traverses this  area of mixed attenuation without compression of the left main coronary artery.   Findings communicated to the cardiology consult service. Recommend cardiac surgery consult. Patient Profile     55 y.o. male with a PMH of bicuspid aortic valve s/p bioprosthetic AVR 2020, HTN, HLD, and DM type 2, who was found to have HACEK organism bacteremia at [facility], transferred to [facility] for TEE.   Assessment & Plan    HACEK organism bacteremia - cultures grew aggregatibacter negative for betalamase - TEE inconclusive - coronary CTA showed perivalvular abscess posterior and lateral to the AVR bioprosthesis with the LMCA traversing through an area of mixed attenuation without evidence of compression of the LMCA - CVTS has been consulted for redo surgery - Dental surgery has been consulted - ABX per ID -f urther recs per CVTS  Hx of severe AS due to bicuspid aortic valve s/p bioprosthetic AVR/Bentall procedure in 2020 - as above  Hypertension - BP on the soft side this am - home lisinopril on hold due to soft BP - Decrease Lopressor to 12.5mg  BID  Nonobstructive CAD - no obstructive CAD on coronary CTA although not scanned as a coronary study - continue BB - he has not had any angina  Hyperlipidemia with LDL goal < 70 02/28/2020: Cholesterol, Total 192; HDL 48; LDL Chol Calc (NIH) 105; Triglycerides 226 - on 40 mg zocor - changed Zocor to atorvastatin 80mg  daily - will need FLP and ALT in 6 weeks  SVT/PVCs - brief episode on telemetry lasting 9 sec 09/27/20, symptomatic - he had  bigeminal PVCs on tele yesterday - electrolytes WNL - continue lopressor   For questions or updates, please contact [facility] Please consult [email or web address] for contact info under        Signed, 11/27/20, MD  10/01/2020, 8:05 AM

## 2020-10-01 NOTE — Progress Notes (Signed)
Subjective: No new complaints   Antibiotics:  Anti-infectives (From admission, onward)    Start     Dose/Rate Route Frequency Ordered Stop   09/27/20 0900  cefTRIAXone (ROCEPHIN) injection 2 g  Status:  Discontinued        2 g Intramuscular Every 24 hours 09/26/20 2224 09/26/20 2229   09/27/20 0900  cefTRIAXone (ROCEPHIN) 2 g in sodium chloride 0.9 % 100 mL IVPB        2 g 200 mL/hr over 30 Minutes Intravenous Every 24 hours 09/26/20 2230         Medications: Scheduled Meds:  aspirin EC  81 mg Oral Daily   atorvastatin  80 mg Oral Daily   docusate sodium  100 mg Oral BID   heparin  5,000 Units Subcutaneous Q8H   insulin aspart  0-15 Units Subcutaneous TID WC   insulin aspart  0-5 Units Subcutaneous QHS   insulin glargine  20 Units Subcutaneous Daily   metoprolol tartrate  25 mg Oral BID   multivitamin with minerals  1 tablet Oral Daily   pantoprazole  40 mg Oral Daily   Ensure Max Protein  11 oz Oral BID   Continuous Infusions:  cefTRIAXone (ROCEPHIN)  IV 2 g (10/01/20 0900)   PRN Meds:.acetaminophen, bisacodyl, magnesium hydroxide, nitroGLYCERIN, ondansetron (ZOFRAN) IV, zolpidem    Objective: Weight change:   Intake/Output Summary (Last 24 hours) at 10/01/2020 1500 Last data filed at 09/30/2020 1910 Gross per 24 hour  Intake 240 ml  Output --  Net 240 ml    Blood pressure 106/69, pulse 83, temperature 97.8 F (36.6 C), temperature source Oral, resp. rate 18, height 5\' 8"  (1.727 m), weight 84.6 kg, SpO2 99 %. Temp:  [97.8 F (36.6 C)-99.3 F (37.4 C)] 97.8 F (36.6 C) (06/12 1115) Pulse Rate:  [76-97] 83 (06/12 1115) Resp:  [15-18] 18 (06/12 1115) BP: (99-121)/(65-71) 106/69 (06/12 1115) SpO2:  [97 %-99 %] 99 % (06/12 0449)  Physical Exam: Physical Exam Constitutional:      Appearance: He is well-developed.  HENT:     Head: Normocephalic and atraumatic.     Mouth/Throat:     Dentition: Abnormal dentition. Dental caries present.      Tongue: No lesions. Tongue does not deviate from midline.     Pharynx: No pharyngeal swelling, oropharyngeal exudate or posterior oropharyngeal erythema.  Eyes:     Conjunctiva/sclera: Conjunctivae normal.  Cardiovascular:     Rate and Rhythm: Normal rate and regular rhythm.     Heart sounds: Murmur heard.     Comments: Click of mechanical valve heard radiation of murmrur to carotids Pulmonary:     Effort: Pulmonary effort is normal. No respiratory distress.     Breath sounds: No stridor. No wheezing or rhonchi.  Abdominal:     General: There is no distension.     Palpations: Abdomen is soft.  Musculoskeletal:        General: Normal range of motion.     Cervical back: Normal range of motion and neck supple.  Skin:    General: Skin is warm and dry.     Findings: No erythema or rash.  Neurological:     General: No focal deficit present.     Mental Status: He is alert and oriented to person, place, and time.  Psychiatric:        Attention and Perception: Attention normal.        Mood and Affect: Mood is  anxious.        Behavior: Behavior normal.        Thought Content: Thought content normal.        Cognition and Memory: Cognition and memory normal.        Judgment: Judgment normal.     CBC:    BMET Recent Labs    09/30/20 0220 10/01/20 0137  NA 132* 132*  K 4.1 4.1  CL 101 97*  CO2 23 26  GLUCOSE 238* 208*  BUN 8 7  CREATININE 0.63 0.71  CALCIUM 8.7* 9.0      Liver Panel  No results for input(s): PROT, ALBUMIN, AST, ALT, ALKPHOS, BILITOT, BILIDIR, IBILI in the last 72 hours.     Sedimentation Rate No results for input(s): ESRSEDRATE in the last 72 hours. C-Reactive Protein No results for input(s): CRP in the last 72 hours.  Micro Results: Recent Results (from the past 720 hour(s))  Culture, blood (routine x 2)     Status: None (Preliminary result)   Collection Time: 09/26/20 11:48 PM   Specimen: BLOOD  Result Value Ref Range Status   Specimen  Description BLOOD SITE NOT SPECIFIED  Final   Special Requests   Final    BOTTLES DRAWN AEROBIC AND ANAEROBIC Blood Culture results may not be optimal due to an excessive volume of blood received in culture bottles   Culture   Final    NO GROWTH 4 DAYS Performed at [facility], [address], [place], [place] [address]    Report Status PENDING  Incomplete  Culture, blood (routine x 2)     Status: None (Preliminary result)   Collection Time: 09/26/20 11:48 PM   Specimen: BLOOD  Result Value Ref Range Status   Specimen Description BLOOD SITE NOT SPECIFIED  Final   Special Requests   Final    BOTTLES DRAWN AEROBIC AND ANAEROBIC Blood Culture adequate volume   Culture   Final    NO GROWTH 4 DAYS Performed at [facility], [address], [place], [place] [address]    Report Status PENDING  Incomplete  SARS CORONAVIRUS 2 (TAT 6-24 HRS) Nasopharyngeal Nasopharyngeal Swab     Status: None   Collection Time: 09/27/20  3:20 AM   Specimen: Nasopharyngeal Swab  Result Value Ref Range Status   SARS Coronavirus 2 NEGATIVE NEGATIVE Final    Comment: (NOTE) SARS-CoV-2 target nucleic acids are NOT DETECTED.  The SARS-CoV-2 RNA is generally detectable in upper and lower respiratory specimens during the acute phase of infection. Negative results do not preclude SARS-CoV-2 infection, do not rule out co-infections with other pathogens, and should not be used as the sole basis for treatment or other patient management decisions. Negative results must be combined with clinical observations, patient history, and epidemiological information. The expected result is Negative.  Fact Sheet for Patients: HairSlick.no  Fact Sheet for Healthcare Providers: quierodirigir.com  This test is not yet approved or cleared by the [place] FDA and  has been authorized for detection and/or diagnosis of SARS-CoV-2 by FDA under an Emergency  Use Authorization (EUA). This EUA will remain  in effect (meaning this test can be used) for the duration of the COVID-19 declaration under Se ction 564(b)(1) of the Act, 21 U.S.C. section 360bbb-3(b)(1), unless the authorization is terminated or revoked sooner.  Performed at [facility], [address], [place], [place] [address]     Studies/Results: [place] Orthopantogram  Result Date: 09/30/2020 CLINICAL DATA:  Dental caries.  No pain at this time. EXAM: ORTHOPANTOGRAM/PANORAMIC COMPARISON:  None. FINDINGS: Multiple lower teeth and several upper teeth are absent. No evidence of fracture. No focal lytic or sclerotic lesion. IMPRESSION: No acute findings. Electronically Signed   By: Elberta Fortis M.D.   On: 09/30/2020 14:08      Assessment/Plan:  INTERVAL HISTORY: He has been seen by Dr. Barbette Merino who sees no teeth that are in immediate need of removal though he said that all teeth should be removed at some point in time.  Principal Problem:   Endocarditis Active Problems:   Hyperlipidemia   Essential hypertension   Alcohol dependency (HCC)   Heart murmur   Bicuspid aortic valve   S/P AVR   Fatigue   Bacteremia   Protein-calorie malnutrition, severe   Dental caries   Aortic valve abscess    Ronnie Ruiz is a 54 y.o. male with history of bicuspid aortic valve and ascending aortic aneurysm status post Bentall procedure in 2020 with diabetes mellitus alcohol abuse and poor dentition now admitted to [facility] with Aggregatibacter bacteremia and found to have perivalvular abscess based on transesophageal echocardiogram and CT angiogram.  Dr. Dorris Fetch has seen the patient from cardiothoracic surgery. He will need redo surgery to treat up periprosthetic valvular abscess with placement of prosthetic material and homograft root.  Dr. Barbette Merino has seen and found no teeth in need of immediate removal  Though he agrees all teeth will need to be removed at some point in  time.  The patient is understandably nervous about his condition but seems clinically to be stable at present.  My understanding is that he will likely go for CT surgery tomorrow or early this week.  I spent more than 35 minutes with the patient including greater than 50% of time in face to face counseling of the patient personally reviewing radiographs, along with pertinent laboratory microbiological data review of medical records and in coordination of his care.  Dr. Luciana Axe is back tomorrow.   LOS: 5 days   Acey Lav 10/01/2020, 3:00 PM

## 2020-10-02 ENCOUNTER — Ambulatory Visit: Payer: Managed Care, Other (non HMO) | Admitting: Legal Medicine

## 2020-10-02 DIAGNOSIS — Q231 Congenital insufficiency of aortic valve: Secondary | ICD-10-CM | POA: Diagnosis not present

## 2020-10-02 DIAGNOSIS — R7881 Bacteremia: Secondary | ICD-10-CM | POA: Diagnosis not present

## 2020-10-02 DIAGNOSIS — I330 Acute and subacute infective endocarditis: Secondary | ICD-10-CM

## 2020-10-02 DIAGNOSIS — T826XXA Infection and inflammatory reaction due to cardiac valve prosthesis, initial encounter: Secondary | ICD-10-CM

## 2020-10-02 LAB — GLUCOSE, CAPILLARY
Glucose-Capillary: 185 mg/dL — ABNORMAL HIGH (ref 70–99)
Glucose-Capillary: 246 mg/dL — ABNORMAL HIGH (ref 70–99)
Glucose-Capillary: 230 mg/dL — ABNORMAL HIGH (ref 70–99)
Glucose-Capillary: 206 mg/dL — ABNORMAL HIGH (ref 70–99)

## 2020-10-02 LAB — BASIC METABOLIC PANEL
Anion gap: 9 (ref 5–15)
BUN: 42 mg/dL — ABNORMAL HIGH (ref 6–20)
CO2: 25 mmol/L (ref 22–32)
Calcium: 8.8 mg/dL — ABNORMAL LOW (ref 8.9–10.3)
Chloride: 101 mmol/L (ref 98–111)
Creatinine, Ser: 3.19 mg/dL — ABNORMAL HIGH (ref 0.61–1.24)
GFR, Estimated: 22 mL/min — ABNORMAL LOW (ref >60)
Glucose, Bld: 130 mg/dL — ABNORMAL HIGH (ref 70–99)
Potassium: 3.5 mmol/L (ref 3.5–5.1)
Sodium: 135 mmol/L (ref 135–145)

## 2020-10-02 LAB — CULTURE, BLOOD (ROUTINE X 2)
Culture: NO GROWTH
Culture: NO GROWTH
Special Requests: ADEQUATE

## 2020-10-02 LAB — CBC WITH DIFFERENTIAL/PLATELET
Abs Immature Granulocytes: 0.03 10*3/uL (ref 0.00–0.07)
Basophils Absolute: 0.1 10*3/uL (ref 0.0–0.1)
Basophils Relative: 1 %
Eosinophils Absolute: 0.5 10*3/uL (ref 0.0–0.5)
Eosinophils Relative: 6 %
HCT: 32.4 % — ABNORMAL LOW (ref 39.0–52.0)
Hemoglobin: 10.8 g/dL — ABNORMAL LOW (ref 13.0–17.0)
Immature Granulocytes: 0 %
Lymphocytes Relative: 12 %
Lymphs Abs: 1 10*3/uL (ref 0.7–4.0)
MCH: 28.5 pg (ref 26.0–34.0)
MCHC: 33.3 g/dL (ref 30.0–36.0)
MCV: 85.5 fL (ref 80.0–100.0)
Monocytes Absolute: 1 10*3/uL (ref 0.1–1.0)
Monocytes Relative: 12 %
Neutro Abs: 5.7 10*3/uL (ref 1.7–7.7)
Neutrophils Relative %: 69 %
Platelets: 243 10*3/uL (ref 150–400)
RBC: 3.79 MIL/uL — ABNORMAL LOW (ref 4.22–5.81)
RDW: 12.8 % (ref 11.5–15.5)
WBC: 8.2 10*3/uL (ref 4.0–10.5)
nRBC: 0 % (ref 0.0–0.2)

## 2020-10-02 LAB — MAGNESIUM: Magnesium: 1.9 mg/dL (ref 1.7–2.4)

## 2020-10-02 LAB — CREATININE, SERUM
Creatinine, Ser: 0.71 mg/dL (ref 0.61–1.24)
GFR, Estimated: >60 mL/min (ref >60)

## 2020-10-02 MED ORDER — INSULIN ASPART 100 UNIT/ML IJ SOLN
4.0000 [IU] | Freq: Three times a day (TID) | INTRAMUSCULAR | Status: DC
  Administered 2020-10-02 – 2020-10-03 (×4): 4 [IU] via SUBCUTANEOUS

## 2020-10-02 MED ORDER — MAGNESIUM CITRATE PO SOLN
1.0000 | Freq: Once | ORAL | Status: AC
  Administered 2020-10-02: 1 via ORAL
  Filled 2020-10-02: qty 296

## 2020-10-02 NOTE — Progress Notes (Signed)
Progress Note  Patient Name: Ronnie Ruiz Date of Encounter: 10/02/2020  [facility] Cardiologist: Gypsy Balsam, MD   Subjective   Constipated, needs home regimen of stool softener. No other complaints  Inpatient Medications    Scheduled Meds:  aspirin EC  81 mg Oral Daily   atorvastatin  80 mg Oral Daily   docusate sodium  100 mg Oral BID   heparin  5,000 Units Subcutaneous Q8H   insulin aspart  0-15 Units Subcutaneous TID WC   insulin aspart  0-5 Units Subcutaneous QHS   insulin glargine  20 Units Subcutaneous Daily   metoprolol tartrate  25 mg Oral BID   multivitamin with minerals  1 tablet Oral Daily   pantoprazole  40 mg Oral Daily   Ensure Max Protein  11 oz Oral BID   Continuous Infusions:  cefTRIAXone (ROCEPHIN)  IV 2 g (10/01/20 0900)   PRN Meds: acetaminophen, bisacodyl, magnesium hydroxide, nitroGLYCERIN, ondansetron (ZOFRAN) IV, zolpidem   Vital Signs    Vitals:   10/01/20 0449 10/01/20 1115 10/01/20 2142 10/02/20 0612  BP: 99/65 106/69 113/70 123/67  Pulse: 76 83  78  Resp: 18 18 16 18   Temp: 99.3 F (37.4 C) 97.8 F (36.6 C) 98.1 F (36.7 C) (!) 97.5 F (36.4 C)  TempSrc: Oral Oral Oral Oral  SpO2: 99%   97%  Weight:      Height:        Intake/Output Summary (Last 24 hours) at 10/02/2020 0755 Last data filed at 10/01/2020 2142 Gross per 24 hour  Intake 240 ml  Output --  Net 240 ml   Last 3 Weights 09/28/2020 09/27/2020 09/26/2020  Weight (lbs) 186 lb 6.4 oz 187 lb 8 oz 189 lb 9.6 oz  Weight (kg) 84.55 kg 85.049 kg 86.002 kg      Telemetry    Sinus rhythm with bigeminy - Personally Reviewed  ECG    No new tracings - Personally Reviewed  Physical Exam   GEN: No acute distress.   Neck: No JVD Cardiac: RRR, crisp valve Respiratory: Clear to auscultation bilaterally. GI: Soft, nontender, non-distended  MS: No edema; No deformity. Neuro:  Nonfocal  Psych: Normal affect   Labs    High Sensitivity Troponin:  No results for  input(s): TROPONINIHS in the last 720 hours.    Chemistry Recent Labs  Lab 09/26/20 2223 09/27/20 0821 09/30/20 0220 10/01/20 0137 10/02/20 0421  NA 132*   < > 132* 132* 135  K 4.1   < > 4.1 4.1 3.5  CL 98   < > 101 97* 101  CO2 26   < > 23 26 25   GLUCOSE 222*   < > 238* 208* 130*  BUN 11   < > 8 7 42*  CREATININE 0.78   < > 0.63 0.71 3.19*  CALCIUM 8.9   < > 8.7* 9.0 8.8*  PROT 6.7  --   --   --   --   ALBUMIN 2.5*  --   --   --   --   AST 24  --   --   --   --   ALT 25  --   --   --   --   ALKPHOS 60  --   --   --   --   BILITOT 0.4  --   --   --   --   GFRNONAA >60   < > >60 >60 22*  ANIONGAP 8   < >  8 9 9    < > = values in this interval not displayed.     Hematology Recent Labs  Lab 09/30/20 0220 10/01/20 0137 10/02/20 0421  WBC 8.2 8.1 8.2  RBC 3.25* 3.29* 3.79*  HGB 9.6* 9.9* 10.8*  HCT 29.9* 30.0* 32.4*  MCV 92.0 91.2 85.5  MCH 29.5 30.1 28.5  MCHC 32.1 33.0 33.3  RDW 13.9 13.7 12.8  PLT 398 357 243    BNPNo results for input(s): BNP, PROBNP in the last 168 hours.   DDimer No results for input(s): DDIMER in the last 168 hours.   Radiology    DG Orthopantogram  Result Date: 09/30/2020 CLINICAL DATA:  Dental caries.  No pain at this time. EXAM: ORTHOPANTOGRAM/PANORAMIC COMPARISON:  None. FINDINGS: Multiple lower teeth and several upper teeth are absent. No evidence of fracture. No focal lytic or sclerotic lesion. IMPRESSION: No acute findings. Electronically Signed   By: 11/30/2020 M.D.   On: 09/30/2020 14:08    Cardiac Studies   TEE pending final read 10-02-20: Informal echo report: Normal biventricular chamber size and function.   Normal aortic bioprosthesis with normal function and no regurgitation or paravalvular leak. No dehiscense or rocking. Prominent post operative changes around valve posteriorly and aortic graft, There is an echo free space posterior and lateral to the prosthesis, which appears contiguous with the native sinus of Valsalva,  without flow demonstrated. This was also possibly present on CT from 2021, and may not be an acute finding. If there is a strong clinical suspicion for endocarditis, wll consider gated cardiac CTA of the valve and aortic root to visualize this area well in the setting of fevers and bacteremia.      Coronary CTA 09/29/2020 CORONARY ARTERIES: Study was not performed to evaluate coronary arteries. Nitroglycerin was not given due to hypotension.   The left main coronary artery is encircled by mixed attenuation mass consistent with abscess. No communication with coronary artery seen. LM is free of disease and is not compressed by abscess.   Left dominant circulation. Calcifications in LAD and L circumflex. Proximal LAD and proximal L circumflex do not appear to have severe stenosis. RCA is small and appears patent.   OTHER:   Mild dilation of main pulmonary artery, 30 mm.   No left atrial appendage thrombus.   Normal pulmonary vein drainage into the left atrium.   IMPRESSION: 1. Findings consistent with perivalvular abscess posterior to the aortic valve bioprosthesis. The left main coronary artery is patent and traverses this area of mixed attenuation without compression of the left main coronary artery.   Findings communicated to the cardiology consult service. Recommend cardiac surgery consult.  Patient Profile     55 y.o. male with a PMH of bicuspid aortic valve s/p bioprosthetic AVR 2020, HTN, HLD, and DM type 2, who was found to have HACEK organism bacteremia at [facility], transferred to [facility] for TEE.   Assessment & Plan    HACEK organism bacteremia Hx of severe AS due to bicuspid aortic valve s/p bioprosthetic AVR/Bentall in 2020 - culture grew aggregatibacter negative for betalamase - TEE was inconclusive - CTA with perivalvular abscess posterior and lateral to the AVR bioprosthesis with LMCA traversing through an area of mixed attenuation without evidence of  compression of the LMCA - CT surgery consulted for redo sternotomy - awaiting further recommendations - ABX per ID   Hypertension - metoprolol   Nonobstructive CAD - continue ASA, lipitor, BB   Hyperlipidemia with LDL goal <  70 - 02/28/2020: Cholesterol, Total 192; HDL 48; LDL Chol Calc (NIH) 105; Triglycerides 226 - changed zocor to 80 mg lipitor - will need fasting lipids and LFTs in 6 weeks   PSVT/PVCs - brief episode of SVT on telemetry lasting 9 sec 09/27/20 - ventricular bigeminy on telemetry - continue lopressor - consider increasing this to 37.5 mg BID - will recheck Mg today as an add-on - K is 3.5   Abnormal lab - sCr went from 0.71 to 3.19 - question accuracy - repeat creatinine normal      For questions or updates, please contact [facility] Please consult [email or web address] for contact info under        Signed, Marcelino Duster, PA  10/02/2020, 7:55 AM

## 2020-10-02 NOTE — Anesthesia Preprocedure Evaluation (Addendum)
Anesthesia Evaluation  Patient identified by MRN, date of birth, ID band Patient awake    Reviewed: Allergy & Precautions, NPO status , Patient's Chart, lab work & pertinent test results, reviewed documented beta blocker date and time   History of Anesthesia Complications Negative for: history of anesthetic complications  Airway Mallampati: II  TM Distance: >3 FB Neck ROM: Full    Dental  (+) Dental Advisory Given, Poor Dentition, Missing, Chipped   Pulmonary shortness of breath,  09/27/2020 SARS coronavirus NEG   breath sounds clear to auscultation       Cardiovascular hypertension, Pt. on medications and Pt. on home beta blockers (-) angina+ Valvular Problems/Murmurs (h/o bicuspid aortic valve with critical AS: Bentall)  Rhythm:Regular Rate:Normal - Systolic murmurs 12/26/2839 ECHO: EF 60%, normal LVF, There is abnormal thickening posterior to the valved conduit beginning at the level of the valve and extending to the sinus of Valsalva, extending along the aorto-mitral continuity. There is an area of echolucency posterior to the prosthetic valve, concerning for perivalvular abscess.The aortic valve has been repaired/replaced. Aortic valve regurgitation is not visualized. Normal function of aortic valve prosthesis, positive PFO with bi-directional shunt    '20 Bentall with replacement of bicuspid aortic valve: biological Bentall procedure with a 25 mm Edwards magna-ease pericardial valve inside a 28 mm Gelweave Valsalva graft and replacement of the ascending aorta (Hemi arch) using a 28 mm Hemashield graft The aortic valve has been repaired/replaced. Aortic valve regurgitation is not visualized. Normal function of aortic valve prosthesis, positive PFO with bi-directional shunt   Neuro/Psych negative neurological ROS  negative psych ROS   GI/Hepatic GERD  Controlled,(+)     substance abuse  alcohol use,   Endo/Other  diabetes, Oral  Hypoglycemic Agents  Renal/GU      Musculoskeletal   Abdominal   Peds  Hematology  (+) Blood dyscrasia (Hb 10.4), anemia ,   Anesthesia Other Findings   Reproductive/Obstetrics                           Anesthesia Physical Anesthesia Plan  ASA: 4  Anesthesia Plan: General   Post-op Pain Management:    Induction: Intravenous  PONV Risk Score and Plan: 2 and Ondansetron and Dexamethasone  Airway Management Planned: Oral ETT  Additional Equipment: Arterial line, PA Cath, TEE and Ultrasound Guidance Line Placement  Intra-op Plan:   Post-operative Plan: Post-operative intubation/ventilation  Informed Consent: I have reviewed the patients History and Physical, chart, labs and discussed the procedure including the risks, benefits and alternatives for the proposed anesthesia with the patient or authorized representative who has indicated his/her understanding and acceptance.     Dental advisory given  Plan Discussed with: CRNA and Surgeon  Anesthesia Plan Comments: (Per [facility]: Dr. Laneta Simmers is doing a Redo Bentall 10/04/20. He wants axillary cannulation and bilateral A-lines. )      Anesthesia Quick Evaluation

## 2020-10-02 NOTE — Progress Notes (Signed)
[facility]  Date of Admission:  09/26/2020   Total days of antibiotics 13         ASSESSMENT: CT coronary showed perivalvular abscess posterior to the prosthetic valve.  CT surgery has been consulted and plan to do a valve replacement.  Patient will need 6 weeks of antibiotics after the surgery.  Continue IV ceftriaxone for Aggregatibacter bacteremia.   Patient will require dental surgery later.  He has been seen by orthodontist who does not think patient needs urgent intervention.  PLAN: Continue IV ceftriaxone.  Will require 6 weeks antibiotics after surgery Pending valve replacement surgery  Need a PICC line   Principal Problem:   Endocarditis Active Problems:   Hyperlipidemia   Essential hypertension   Alcohol dependency (HCC)   Heart murmur   Bicuspid aortic valve   S/P AVR   Fatigue   Bacteremia   Protein-calorie malnutrition, severe   Dental caries   Aortic valve abscess   Scheduled Meds:  aspirin EC  81 mg Oral Daily   atorvastatin  80 mg Oral Daily   docusate sodium  100 mg Oral BID   heparin  5,000 Units Subcutaneous Q8H   insulin aspart  0-15 Units Subcutaneous TID WC   insulin aspart  0-5 Units Subcutaneous QHS   insulin glargine  20 Units Subcutaneous Daily   metoprolol tartrate  25 mg Oral BID   multivitamin with minerals  1 tablet Oral Daily   pantoprazole  40 mg Oral Daily   Ensure Max Protein  11 oz Oral BID   Continuous Infusions:  cefTRIAXone (ROCEPHIN)  IV 2 g (10/02/20 0955)   PRN Meds:.acetaminophen, bisacodyl, magnesium hydroxide, nitroGLYCERIN, ondansetron (ZOFRAN) IV, zolpidem   SUBJECTIVE: Patient is seen at bedside.  He appears comfortable in no acute distress.  Does not have any complaints.  Review of Systems: ROS Per HPI  No Known Allergies  OBJECTIVE: Vitals:   10/02/20 0612 10/02/20 0815 10/02/20 1057 10/02/20 1145  BP: 123/67 119/77 99/68 130/60  Pulse: 78 83 78   Resp: 18 18  16    Temp: (!) 97.5 F (36.4 C) (!) 97.3 F (36.3 C)  98.6 F (37 C)  TempSrc: Oral Oral  Oral  SpO2: 97% 97%    Weight:      Height:       Body mass index is 28.34 kg/m.  Physical Exam Constitutional:      General: He is not in acute distress.    Appearance: He is not ill-appearing.  HENT:     Head: Normocephalic.  Eyes:     General: No scleral icterus.       Right eye: No discharge.        Left eye: No discharge.     Conjunctiva/sclera: Conjunctivae normal.  Cardiovascular:     Rate and Rhythm: Normal rate and regular rhythm.     Heart sounds: Murmur (37 6 systolic murmur at the upper sternal border) heard.  Skin:    General: Skin is warm.     Coloration: Skin is not jaundiced.  Neurological:     General: No focal deficit present.     Mental Status: He is alert. Mental status is at baseline.  Psychiatric:        Mood and Affect: Mood normal.        Thought Content: Thought content normal.        Judgment: Judgment normal.    Lab Results  Lab Results  Component Value Date   WBC 8.2 10/02/2020   HGB 10.8 (L) 10/02/2020   HCT 32.4 (L) 10/02/2020   MCV 85.5 10/02/2020   PLT 243 10/02/2020    Lab Results  Component Value Date   CREATININE 0.71 10/02/2020   BUN 42 (H) 10/02/2020   NA 135 10/02/2020   K 3.5 10/02/2020   CL 101 10/02/2020   CO2 25 10/02/2020    Lab Results  Component Value Date   ALT 25 09/26/2020   AST 24 09/26/2020   ALKPHOS 60 09/26/2020   BILITOT 0.4 09/26/2020     Microbiology: Recent Results (from the past 240 hour(s))  Culture, blood (routine x 2)     Status: None   Collection Time: 09/26/20 11:48 PM   Specimen: BLOOD  Result Value Ref Range Status   Specimen Description BLOOD SITE NOT SPECIFIED  Final   Special Requests   Final    BOTTLES DRAWN AEROBIC AND ANAEROBIC Blood Culture results may not be optimal due to an excessive volume of blood received in culture bottles   Culture   Final    NO GROWTH 5 DAYS Performed at [facility], [address] [place], [place] [address]    Report Status 10/02/2020 FINAL  Final  Culture, blood (routine x 2)     Status: None   Collection Time: 09/26/20 11:48 PM   Specimen: BLOOD  Result Value Ref Range Status   Specimen Description BLOOD SITE NOT SPECIFIED  Final   Special Requests   Final    BOTTLES DRAWN AEROBIC AND ANAEROBIC Blood Culture adequate volume   Culture   Final    NO GROWTH 5 DAYS Performed at [facility], [address], [place], [place] [address]    Report Status 10/02/2020 FINAL  Final  SARS CORONAVIRUS 2 (TAT 6-24 HRS) Nasopharyngeal Nasopharyngeal Swab     Status: None   Collection Time: 09/27/20  3:20 AM   Specimen: Nasopharyngeal Swab  Result Value Ref Range Status   SARS Coronavirus 2 NEGATIVE NEGATIVE Final    Comment: (NOTE) SARS-CoV-2 target nucleic acids are NOT DETECTED.  The SARS-CoV-2 RNA is generally detectable in upper and lower respiratory specimens during the acute phase of infection. Negative results do not preclude SARS-CoV-2 infection, do not rule out co-infections with other pathogens, and should not be used as the sole basis for treatment or other patient management decisions. Negative results must be combined with clinical observations, patient history, and epidemiological information. The expected result is Negative.  Fact Sheet for Patients: HairSlick.no  Fact Sheet for Healthcare Providers: quierodirigir.com  This test is not yet approved or cleared by the [place] FDA and  has been authorized for detection and/or diagnosis of SARS-CoV-2 by FDA under an Emergency Use Authorization (EUA). This EUA will remain  in effect (meaning this test can be used) for the duration of the COVID-19 declaration under Se ction 564(b)(1) of the Act, 21 U.S.C. section 360bbb-3(b)(1), unless the authorization is terminated or revoked sooner.  Performed at [facility], [address] [place], [place] [address]     Doran Stabler, DO [facility] [phone] pager   [phone] cell 10/02/2020, 1:33 PM

## 2020-10-02 NOTE — Progress Notes (Signed)
Patient bladder scan done showing in bladder.  Patient then voided 150cc amber urine.  Repeat scan showing 453 ml in bladder. Patient states he was not able to empty bladder due to constipation.  He is eating breakfast at this time and will try to have BM when done.  Will repeat bladder scan and I/O cath if volume remains elevated.

## 2020-10-02 NOTE — Progress Notes (Signed)
4 Days Post-Op Procedure(s) (LRB): TRANSESOPHAGEAL ECHOCARDIOGRAM (TEE) (N/A) BUBBLE STUDY Subjective: No chest pain or shortness of breath.  Objective: Vital signs in last 24 hours: Temp:  [97.3 F (36.3 C)-98.6 F (37 C)] 97.7 F (36.5 C) (06/13 1717) Pulse Rate:  [78-83] 80 (06/13 1717) Cardiac Rhythm: Normal sinus rhythm (06/13 0923) Resp:  [16-18] 18 (06/13 1717) BP: (99-130)/(60-77) 115/69 (06/13 1717) SpO2:  [97 %] 97 % (06/13 1717)  Hemodynamic parameters for last 24 hours:    Intake/Output from previous day: 06/12 0701 - 06/13 0700 In: 240 [P.O.:240] Out: -  Intake/Output this shift: No intake/output data recorded.  General appearance: alert and cooperative Heart: RRR 2/6 systolic murmur RSB. No diastolic murmur. Lungs: clear. Ext: no peripheral edema.  Lab Results: Recent Labs    10/01/20 0137 10/02/20 0421  WBC 8.1 8.2  HGB 9.9* 10.8*  HCT 30.0* 32.4*  PLT 357 243   BMET:  Recent Labs    10/01/20 0137 10/02/20 0421 10/02/20 0748  NA 132* 135  --   K 4.1 3.5  --   CL 97* 101  --   CO2 26 25  --   GLUCOSE 208* 130*  --   BUN 7 42*  --   CREATININE 0.71 3.19* 0.71  CALCIUM 9.0 8.8*  --     PT/INR: No results for input(s): LABPROT, INR in the last 72 hours. ABG    Component Value Date/Time   PHART 7.383 09/11/2018 0239   HCO3 22.9 09/11/2018 0239   TCO2 27 09/11/2018 1643   ACIDBASEDEF 2.0 09/11/2018 0239   O2SAT 96.0 09/11/2018 0239   CBG (last 3)  Recent Labs    10/02/20 0743 10/02/20 1146 10/02/20 1722  GLUCAP 185* 246* 230*    Assessment/Plan:  Medical record, CT scans reviewed and patient examined.  He is known to me from prior surgery having undergone biological Bentall procedure with a 25 mm Edwards magna-ease pericardial valve inside a 28 mm Gelweave Valsalva graft and replacement of the ascending aorta (Hemi arch) using a 28 mm Hemashield graft on 09/22/2018.  I used a bioprosthetic valve due to his significant arthritis and  nonsteroidal anti-inflammatory use as well as alcohol abuse.  That was a somewhat complicated procedure due to extensive calcification of his aortic annulus and a weak aortic annulus after calcium was removed.  He recovered well and has been fine until he had a cortisol injection in his left shoulder for chronic shoulder pain and said that after that his blood sugar got out of control and he began feeling poorly with night sweats, fevers, chills, and generalized weakness.  He presented to [facility] with uncontrolled diabetes, dehydration, and hypertension as well as acute kidney injury.  Blood cultures grew Aggregatibacter which is typically a dental pathogen. He was treated by his dentist prior to his valve surgery and had several bad teeth extracted but still had several remaining teeth that have been in poor condition but not causing him any symptoms. He has been seen by oral surgery here (Dr. Barbette Merino) and not felt to have any acute problems and orthopantogram negative. Dr. Barbette Merino felt that his remaining teeth could be extracted later. Follow up [facility] here have been negative. His TEE showed a periannular abscess posteriorly and around the left main coronary artery. Gated cardiac CTA confirms this. The collection was not present on his postop echo or on followup CTA of the chest last June. The valve is functioning fine with no AS/AI and no vegetation  but given history I think we have to assume this is a real abscess. Unfortunately replacement of his aortic valve and graft is an extremely complicated and risky operation with high risk of coronary complications, bleeding, myocardial failure and death. Unfortunately the mortality rate without surgery for prosthetic valve endocarditis is also high. He understands and agrees to proceed with surgery. I will plan to do Wednesday.   LOS: 6 days    [name] 10/02/2020

## 2020-10-02 NOTE — Progress Notes (Signed)
PROGRESS NOTE    Ronnie Ruiz  FAO:[id number] DOB: 05-29-1965 DOA: 09/26/2020 PCP: [name], MD    Brief Narrative:  Ronnie Ruiz is a 55 year old male with past medical history significant for essential hypertension, hyperlipidemia, type 2 diabetes mellitus, history of by cuspid aortic valve s/p bioprosthetic AVR 2020 who was found to have HAECK organism bacteremia at [facility] and was transferred to [facility] on 6/7 for TEE and ID consultation.  Patient was transferred from the cardiology service to [facility] on 09/28/2020.   Assessment & Plan:   Principal Problem:   Endocarditis Active Problems:   Hyperlipidemia   Essential hypertension   Alcohol dependency (HCC)   Heart murmur   Bicuspid aortic valve   S/P AVR   Fatigue   Bacteremia   Protein-calorie malnutrition, severe   Dental caries   Aortic valve abscess   Aggregatobacter (HAECK) septicemia, POA Patient initially presented to [facility] with fever, weakness and fatigue.  He was originally seen by outpatient cardiology on 09/15/2020 by Dr. Tomie China with reports of night sweats and fatigue and recommended to undergo a echocardiogram, blood cultures given AVR.  Patient was reported to have positive blood cultures and sent to the ED for further management.  Blood cultures from 09/15/2020 reportedly positive for aggregatibacter (HAECK organism), which is felt to be secondary to his poor dentition.  He was started on ceftriaxone on 09/20/2020.  TTE was unrevealing with LVEF 60 to 65%, mild LAE, mild MR/TR, and AV s/p replacement with mean gradient 14 mmHg, but cannot rule out endocarditis.  He was seen by Dr. Bing Matter in consultation given history of AVR, was recommended to transfer to [facility] for TEE and ID consultation.  TEE on 09/29/2018 with no definite evidence of acute endocarditis or valvular abscess, however some findings cannot be clearly distinguished from postoperative change.  CT coronary 09/29/2020  with findings consistent with perivalvular abscess posterior to the aortic valve bioprosthesis.  Peak blood cultures from 09/26/2020 with no growth x5 days and now finalized. --Cardiology and infectious disease following, appreciate assistance --TMAX 98.1 past 24h --Ceftriaxone 2 g IV every 24 hours --CBC daily, monitor fever curve  Severe AS with bicuspid aortic valve s/p bioprosthetic AVR 2020; now with perivalvular abscess CT coronary 09/29/2020 with findings consistent with perivalvular abscess posterior to the aortic valve bioprosthesis. --TCTS following; anticipate need for replacement of prosthetic material with a homograft root  --Continue antibiotics as above per ID.  Poor dentition Seen by oral surgery, Dr. Barbette Merino on 10/01/2020, no need for immediate tooth extraction, but agrees all teeth will need to be removed at some point.  Essential hypertension Patient was noted to have intermittent hypotension at [facility].  On lisinopril and metoprolol at home. --BP 106/69 this morning --Continue metoprolol tartrate 25 mg p.o. twice daily --Continue to hold home lisinopril for now --Continue aspirin and statin --Continue monitor BP closely  Hyperlipidemia --Continue simvastatin 40 mg p.o. daily  Type 2 diabetes mellitus, with hyperglycemia Hemoglobin A1c 9.2 on 09/19/2020, poorly controlled.  On metformin 500 mg p.o. twice daily outpatient. --Hold home metformin while inpatient; would benefit from increase to 1000mg  twice daily on discharge --Lantus 20 units subcutaneously daily --SSI for coverage --CBGs before every meal/at bedtime --Diabetic educator consult  GERD: Continue PPI  Severe protein calorie malnutrition Body mass index is 28.34 kg/m.  Notable moderate muscle depletion and 9% weight loss in 1 month. Nutrition Status: Nutrition Problem: Severe Malnutrition Etiology: acute illness (endocarditis) Signs/Symptoms: moderate  muscle depletion, percent weight loss (9%  weight loss within 1 month) Percent weight loss: 9 % Interventions: MVI, Other (Comment) (Ensure Max) --Dietitian following, appreciate assistance. --Continue to encourage increased oral intake, supplements    DVT prophylaxis: heparin injection 5,000 Units Start: 09/26/20 2300    Code Status: Full Code Family Communication: None present at bedside this morning  Disposition Plan:  Level of care: Telemetry Medical Status is: Inpatient  Remains inpatient appropriate because:Ongoing diagnostic testing needed not appropriate for outpatient work up, Unsafe d/c plan, IV treatments appropriate due to intensity of illness or inability to take PO, and Inpatient level of care appropriate due to severity of illness  Dispo: The patient is from: Home              Anticipated d/c is to: Home              Patient currently is not medically stable to d/c.   Difficult to place patient No  Consultants:  Cardiology Infectious disease Cardiothoracic surgery Oral surgery, Dr. Barbette MerinoJensen  Procedures:  TTE TEE Cardiac CTA  Antimicrobials:  Ceftriaxone   Subjective: Patient seen examined bedside, resting comfortably.  No specific complaints this morning.  Patient's creatinine was noted to be elevated at 3.19, up from 0.71, repeat was within normal limits, suspect lab error.  Awaiting CTS for surgical determination.  Denies headache, no dizziness, no chest pain, palpitations, no shortness of breath, no fever/chills/night sweats, no nausea/vomiting/diarrhea, no weakness, no fatigue, no paresthesias.  No acute events overnight per nursing staff.  Objective: Vitals:   10/01/20 1115 10/01/20 2142 10/02/20 0612 10/02/20 0815  BP: 106/69 113/70 123/67 119/77  Pulse: 83  78 83  Resp: 18 16 18 18   Temp: 97.8 F (36.6 C) 98.1 F (36.7 C) (!) 97.5 F (36.4 C) (!) 97.3 F (36.3 C)  TempSrc: Oral Oral Oral Oral  SpO2:   97% 97%  Weight:      Height:        Intake/Output Summary (Last 24 hours) at  10/02/2020 1048 Last data filed at 10/02/2020 0900 Gross per 24 hour  Intake 480 ml  Output 400 ml  Net 80 ml   Filed Weights   09/26/20 2121 09/27/20 0335 09/28/20 0333  Weight: 86 kg 85 kg 84.6 kg    Examination:  General exam: Appears calm and comfortable, poor dentition, moderate muscle wasting and fat depletion noted Respiratory system: Clear to auscultation. Respiratory effort normal.  On room air Cardiovascular system: S1 & S2 heard, RRR. No JVD, murmurs, rubs, gallops or clicks. No pedal edema. Gastrointestinal system: Abdomen is nondistended, soft and nontender. No organomegaly or masses felt. Normal bowel sounds heard. Central nervous system: Alert and oriented. No focal neurological deficits. Extremities: Symmetric 5 x 5 power. Skin: No rashes, lesions or ulcers Psychiatry: Judgement and insight appear normal. Mood & affect appropriate.     Data Reviewed: I have personally reviewed following labs and imaging studies  CBC: Recent Labs  Lab 09/28/20 0353 09/29/20 0417 09/30/20 0220 10/01/20 0137 10/02/20 0421  WBC 11.0* 10.2 8.2 8.1 8.2  NEUTROABS 7.4 7.0 5.6 5.5 5.7  HGB 10.2* 10.5* 9.6* 9.9* 10.8*  HCT 31.1* 32.6* 29.9* 30.0* 32.4*  MCV 92.8 92.6 92.0 91.2 85.5  PLT 390 385 398 357 243   Basic Metabolic Panel: Recent Labs  Lab 09/26/20 2223 09/27/20 0821 09/28/20 0353 09/29/20 0417 09/30/20 0220 10/01/20 0137 10/02/20 0421 10/02/20 0748  NA 132*   < > 133* 135 132* 132*  135  --   K 4.1   < > 3.8 4.4 4.1 4.1 3.5  --   CL 98   < > 99 102 101 97* 101  --   CO2 26   < > 26 25 23 26 25   --   GLUCOSE 222*   < > 195* 214* 238* 208* 130*  --   BUN 11   < > 10 11 8 7  42*  --   CREATININE 0.78   < > 0.70 0.72 0.63 0.71 3.19* 0.71  CALCIUM 8.9   < > 9.1 9.1 8.7* 9.0 8.8*  --   MG 1.9  --   --   --   --   --   --  1.9   < > = values in this interval not displayed.   GFR: Estimated Creatinine Clearance: 109.2 mL/min (by C-G formula based on SCr of 0.71  mg/dL). Liver Function Tests: Recent Labs  Lab 09/26/20 2223  AST 24  ALT 25  ALKPHOS 60  BILITOT 0.4  PROT 6.7  ALBUMIN 2.5*   No results for input(s): LIPASE, AMYLASE in the last 168 hours. No results for input(s): AMMONIA in the last 168 hours. Coagulation Profile: Recent Labs  Lab 09/26/20 2223  INR 1.0   Cardiac Enzymes: No results for input(s): CKTOTAL, CKMB, CKMBINDEX, TROPONINI in the last 168 hours. BNP (last 3 results) No results for input(s): PROBNP in the last 8760 hours. HbA1C: No results for input(s): HGBA1C in the last 72 hours. CBG: Recent Labs  Lab 10/01/20 0736 10/01/20 1113 10/01/20 1700 10/01/20 2127 10/02/20 0743  GLUCAP 218* 256* 250* 218* 185*   Lipid Profile: No results for input(s): CHOL, HDL, LDLCALC, TRIG, CHOLHDL, LDLDIRECT in the last 72 hours. Thyroid Function Tests: No results for input(s): TSH, T4TOTAL, FREET4, T3FREE, THYROIDAB in the last 72 hours. Anemia Panel: No results for input(s): VITAMINB12, FOLATE, FERRITIN, TIBC, IRON, RETICCTPCT in the last 72 hours. Sepsis Labs: No results for input(s): PROCALCITON, LATICACIDVEN in the last 168 hours.  Recent Results (from the past 240 hour(s))  Culture, blood (routine x 2)     Status: None   Collection Time: 09/26/20 11:48 PM   Specimen: BLOOD  Result Value Ref Range Status   Specimen Description BLOOD SITE NOT SPECIFIED  Final   Special Requests   Final    BOTTLES DRAWN AEROBIC AND ANAEROBIC Blood Culture results may not be optimal due to an excessive volume of blood received in culture bottles   Culture   Final    NO GROWTH 5 DAYS Performed at [facility], [address], [place], [address] [place]    Report Status 10/02/2020 FINAL  Final  Culture, blood (routine x 2)     Status: None   Collection Time: 09/26/20 11:48 PM   Specimen: BLOOD  Result Value Ref Range Status   Specimen Description BLOOD SITE NOT SPECIFIED  Final   Special Requests   Final    BOTTLES DRAWN  AEROBIC AND ANAEROBIC Blood Culture adequate volume   Culture   Final    NO GROWTH 5 DAYS Performed at [facility], [address] [place], [address] [place]    Report Status 10/02/2020 FINAL  Final  SARS CORONAVIRUS 2 (TAT 6-24 HRS) Nasopharyngeal Nasopharyngeal Swab     Status: None   Collection Time: 09/27/20  3:20 AM   Specimen: Nasopharyngeal Swab  Result Value Ref Range Status   SARS Coronavirus 2 NEGATIVE NEGATIVE  Final    Comment: (NOTE) SARS-CoV-2 target nucleic acids are NOT DETECTED.  The SARS-CoV-2 RNA is generally detectable in upper and lower respiratory specimens during the acute phase of infection. Negative results do not preclude SARS-CoV-2 infection, do not rule out co-infections with other pathogens, and should not be used as the sole basis for treatment or other patient management decisions. Negative results must be combined with clinical observations, patient history, and epidemiological information. The expected result is Negative.  Fact Sheet for Patients: HairSlick.no  Fact Sheet for Healthcare Providers: quierodirigir.com  This test is not yet approved or cleared by the [place] FDA and  has been authorized for detection and/or diagnosis of SARS-CoV-2 by FDA under an Emergency Use Authorization (EUA). This EUA will remain  in effect (meaning this test can be used) for the duration of the COVID-19 declaration under Se ction 564(b)(1) of the Act, 21 U.S.C. section 360bbb-3(b)(1), unless the authorization is terminated or revoked sooner.  Performed at [facility], [address], [place], [place] [address]          Radiology Studies: DG Orthopantogram  Result Date: 09/30/2020 CLINICAL DATA:  Dental caries.  No pain at this time. EXAM: ORTHOPANTOGRAM/PANORAMIC COMPARISON:  None. FINDINGS: Multiple lower teeth and several upper teeth are absent. No evidence of fracture. No  focal lytic or sclerotic lesion. IMPRESSION: No acute findings. Electronically Signed   By: Elberta Fortis M.D.   On: 09/30/2020 14:08        Scheduled Meds:  aspirin EC  81 mg Oral Daily   atorvastatin  80 mg Oral Daily   docusate sodium  100 mg Oral BID   heparin  5,000 Units Subcutaneous Q8H   insulin aspart  0-15 Units Subcutaneous TID WC   insulin aspart  0-5 Units Subcutaneous QHS   insulin glargine  20 Units Subcutaneous Daily   magnesium citrate  1 Bottle Oral Once   metoprolol tartrate  25 mg Oral BID   multivitamin with minerals  1 tablet Oral Daily   pantoprazole  40 mg Oral Daily   Ensure Max Protein  11 oz Oral BID   Continuous Infusions:  cefTRIAXone (ROCEPHIN)  IV 2 g (10/02/20 0955)     LOS: 6 days    Time spent: 36 minutes spent on chart review, discussion with nursing staff, consultants, updating family and interview/physical exam; more than 50% of that time was spent in counseling and/or coordination of care.    Alvira Philips [place], DO [facility] Available via Epic secure chat 7am-7pm After these hours, please refer to coverage provider listed on [email or web address] 10/02/2020, 10:48 AM

## 2020-10-02 NOTE — Progress Notes (Signed)
Inpatient Diabetes Program Recommendations  AACE/ADA: New Consensus Statement on Inpatient Glycemic Control (2015)  Target Ranges:  Prepandial:   less than 140 mg/dL      Peak postprandial:   less than 180 mg/dL (1-2 hours)      Critically ill patients:  140 - 180 mg/dL   Lab Results  Component Value Date   GLUCAP 246 (H) 10/02/2020   HGBA1C 7.4 (H) 02/28/2020    Review of Glycemic Control  Diabetes history: DM2 Outpatient Diabetes medications: metformin 500 mg BID Current orders for Inpatient glycemic control: Lantus 20 units QD, Novolog 0-15 units TID with meals and 0-5 HS  6/12: Lantus 20 units QD and 20 units of Novolog s/s. Post-prandials elevated. May benefit from adding meal coverage insulin.  Inpatient Diabetes Program Recommendations:    Add Novolog 4 units TID with meals if eating > 50% meal.  Will teach insulin pen administration if pt likely to go home on insulin. Need updated HgbA1C.  Spoke with pt on phone regarding his blood sugars and insulin administration. Pt is hoping blood sugars will go down after his surgery. Said he is not opposed to going home on insulin if it's needed, to control his blood sugars. Pt said he's eating "pretty good."  Continue to follow.  Thank you. Ailene Ards, RD, LDN, CDE Inpatient Diabetes Coordinator [phone]

## 2020-10-03 ENCOUNTER — Inpatient Hospital Stay (HOSPITAL_COMMUNITY): Payer: Managed Care, Other (non HMO)

## 2020-10-03 DIAGNOSIS — I330 Acute and subacute infective endocarditis: Secondary | ICD-10-CM

## 2020-10-03 DIAGNOSIS — T826XXA Infection and inflammatory reaction due to cardiac valve prosthesis, initial encounter: Secondary | ICD-10-CM

## 2020-10-03 DIAGNOSIS — Q231 Congenital insufficiency of aortic valve: Secondary | ICD-10-CM | POA: Diagnosis not present

## 2020-10-03 DIAGNOSIS — B3321 Viral endocarditis: Secondary | ICD-10-CM | POA: Diagnosis not present

## 2020-10-03 DIAGNOSIS — Z01810 Encounter for preprocedural cardiovascular examination: Secondary | ICD-10-CM

## 2020-10-03 DIAGNOSIS — R7881 Bacteremia: Secondary | ICD-10-CM | POA: Diagnosis not present

## 2020-10-03 LAB — CBC WITH DIFFERENTIAL/PLATELET
Abs Immature Granulocytes: 0.03 10*3/uL (ref 0.00–0.07)
Basophils Absolute: 0.1 10*3/uL (ref 0.0–0.1)
Basophils Relative: 1 %
Eosinophils Absolute: 0.3 10*3/uL (ref 0.0–0.5)
Eosinophils Relative: 3 %
HCT: 32.7 % — ABNORMAL LOW (ref 39.0–52.0)
Hemoglobin: 10.4 g/dL — ABNORMAL LOW (ref 13.0–17.0)
Immature Granulocytes: 0 %
Lymphocytes Relative: 27 %
Lymphs Abs: 2.4 10*3/uL (ref 0.7–4.0)
MCH: 29.1 pg (ref 26.0–34.0)
MCHC: 31.8 g/dL (ref 30.0–36.0)
MCV: 91.3 fL (ref 80.0–100.0)
Monocytes Absolute: 0.8 10*3/uL (ref 0.1–1.0)
Monocytes Relative: 9 %
Neutro Abs: 5.4 10*3/uL (ref 1.7–7.7)
Neutrophils Relative %: 60 %
Platelets: 404 10*3/uL — ABNORMAL HIGH (ref 150–400)
RBC: 3.58 MIL/uL — ABNORMAL LOW (ref 4.22–5.81)
RDW: 13.7 % (ref 11.5–15.5)
WBC: 9 10*3/uL (ref 4.0–10.5)
nRBC: 0 % (ref 0.0–0.2)

## 2020-10-03 LAB — URINALYSIS, ROUTINE W REFLEX MICROSCOPIC
Bacteria, UA: NONE SEEN
Bilirubin Urine: NEGATIVE
Glucose, UA: NEGATIVE mg/dL
Ketones, ur: NEGATIVE mg/dL
Leukocytes,Ua: NEGATIVE
Nitrite: NEGATIVE
Protein, ur: NEGATIVE mg/dL
Specific Gravity, Urine: 1.005 (ref 1.005–1.030)
pH: 6 (ref 5.0–8.0)

## 2020-10-03 LAB — SURGICAL PCR SCREEN
MRSA, PCR: NEGATIVE
Staphylococcus aureus: NEGATIVE

## 2020-10-03 LAB — GLUCOSE, CAPILLARY
Glucose-Capillary: 175 mg/dL — ABNORMAL HIGH (ref 70–99)
Glucose-Capillary: 230 mg/dL — ABNORMAL HIGH (ref 70–99)
Glucose-Capillary: 154 mg/dL — ABNORMAL HIGH (ref 70–99)
Glucose-Capillary: 183 mg/dL — ABNORMAL HIGH (ref 70–99)

## 2020-10-03 LAB — BASIC METABOLIC PANEL
Anion gap: 7 (ref 5–15)
BUN: 11 mg/dL (ref 6–20)
CO2: 27 mmol/L (ref 22–32)
Calcium: 9.1 mg/dL (ref 8.9–10.3)
Chloride: 98 mmol/L (ref 98–111)
Creatinine, Ser: 0.7 mg/dL (ref 0.61–1.24)
GFR, Estimated: >60 mL/min (ref >60)
Glucose, Bld: 166 mg/dL — ABNORMAL HIGH (ref 70–99)
Potassium: 4.1 mmol/L (ref 3.5–5.1)
Sodium: 132 mmol/L — ABNORMAL LOW (ref 135–145)

## 2020-10-03 LAB — PREPARE RBC (CROSSMATCH)

## 2020-10-03 MED ORDER — CEFAZOLIN SODIUM-DEXTROSE 2-4 GM/100ML-% IV SOLN
2.0000 g | INTRAVENOUS | Status: DC
  Filled 2020-10-03: qty 100

## 2020-10-03 MED ORDER — CEFAZOLIN SODIUM-DEXTROSE 2-4 GM/100ML-% IV SOLN
2.0000 g | INTRAVENOUS | Status: AC
  Administered 2020-10-04 (×2): 2 g via INTRAVENOUS
  Filled 2020-10-03: qty 100

## 2020-10-03 MED ORDER — TRANEXAMIC ACID (OHS) PUMP PRIME SOLUTION
2.0000 mg/kg | INTRAVENOUS | Status: DC
  Filled 2020-10-03: qty 1.69

## 2020-10-03 MED ORDER — CHLORHEXIDINE GLUCONATE CLOTH 2 % EX PADS
6.0000 | MEDICATED_PAD | Freq: Once | CUTANEOUS | Status: AC
  Administered 2020-10-04: 6 via TOPICAL

## 2020-10-03 MED ORDER — BISACODYL 5 MG PO TBEC: 5.0000 mg | DELAYED_RELEASE_TABLET | Freq: Once | ORAL | Status: AC

## 2020-10-03 MED ORDER — DEXMEDETOMIDINE HCL IN NACL 400 MCG/100ML IV SOLN
0.1000 ug/kg/h | INTRAVENOUS | Status: AC
  Administered 2020-10-04: .3 ug/kg/h via INTRAVENOUS
  Filled 2020-10-03: qty 100

## 2020-10-03 MED ORDER — TRANEXAMIC ACID (OHS) BOLUS VIA INFUSION
15.0000 mg/kg | INTRAVENOUS | Status: AC
  Administered 2020-10-04: 1269 mg via INTRAVENOUS
  Filled 2020-10-03: qty 1269

## 2020-10-03 MED ORDER — MAGNESIUM SULFATE 50 % IJ SOLN
40.0000 meq | INTRAMUSCULAR | Status: DC
  Filled 2020-10-03: qty 9.85

## 2020-10-03 MED ORDER — DIAZEPAM 5 MG PO TABS: 5.0000 mg | ORAL_TABLET | Freq: Once | ORAL | Status: DC

## 2020-10-03 MED ORDER — PHENYLEPHRINE HCL-NACL 20-0.9 MG/250ML-% IV SOLN
30.0000 ug/min | INTRAVENOUS | Status: AC
  Administered 2020-10-04: 15 ug/min via INTRAVENOUS
  Filled 2020-10-03: qty 250

## 2020-10-03 MED ORDER — CHLORHEXIDINE GLUCONATE 0.12 % MT SOLN
15.0000 mL | Freq: Once | OROMUCOSAL | Status: AC
  Administered 2020-10-04: 15 mL via OROMUCOSAL

## 2020-10-03 MED ORDER — INSULIN REGULAR(HUMAN) IN NACL 100-0.9 UT/100ML-% IV SOLN
INTRAVENOUS | Status: AC
  Administered 2020-10-04: 5.5 [IU]/h via INTRAVENOUS
  Filled 2020-10-03: qty 100

## 2020-10-03 MED ORDER — NOREPINEPHRINE 4 MG/250ML-% IV SOLN
0.0000 ug/min | INTRAVENOUS | Status: DC
  Filled 2020-10-03: qty 250

## 2020-10-03 MED ORDER — NITROGLYCERIN IN D5W 200-5 MCG/ML-% IV SOLN
2.0000 ug/min | INTRAVENOUS | Status: AC
  Administered 2020-10-04: 10 ug/min via INTRAVENOUS
  Filled 2020-10-03: qty 250

## 2020-10-03 MED ORDER — TRANEXAMIC ACID 1000 MG/10ML IV SOLN
1.5000 mg/kg/h | INTRAVENOUS | Status: AC
  Administered 2020-10-04: 1.5 mg/kg/h via INTRAVENOUS
  Filled 2020-10-03: qty 25

## 2020-10-03 MED ORDER — MILRINONE LACTATE IN DEXTROSE 20-5 MG/100ML-% IV SOLN
0.3000 ug/kg/min | INTRAVENOUS | Status: DC
  Filled 2020-10-03: qty 100

## 2020-10-03 MED ORDER — VANCOMYCIN HCL 1500 MG/300ML IV SOLN
1500.0000 mg | INTRAVENOUS | Status: AC
  Administered 2020-10-04: 1500 mg via INTRAVENOUS
  Filled 2020-10-03: qty 300

## 2020-10-03 MED ORDER — PLASMA-LYTE A IV SOLN
INTRAVENOUS | Status: AC
  Administered 2020-10-04: 1000 mL
  Filled 2020-10-03: qty 5

## 2020-10-03 MED ORDER — EPINEPHRINE HCL 5 MG/250ML IV SOLN IN NS
0.0000 ug/min | INTRAVENOUS | Status: AC
  Administered 2020-10-04: 1 ug/min via INTRAVENOUS
  Filled 2020-10-03: qty 250

## 2020-10-03 MED ORDER — SODIUM CHLORIDE 0.9 % IV SOLN
INTRAVENOUS | Status: DC
  Filled 2020-10-03: qty 30

## 2020-10-03 MED ORDER — POTASSIUM CHLORIDE 2 MEQ/ML IV SOLN
80.0000 meq | INTRAVENOUS | Status: DC
  Filled 2020-10-03: qty 40

## 2020-10-03 MED ORDER — ALPRAZOLAM 0.25 MG PO TABS
0.2500 mg | ORAL_TABLET | ORAL | Status: DC | PRN
Start: 2020-10-03 — End: 2020-10-04
  Administered 2020-10-03: 0.25 mg via ORAL
  Filled 2020-10-03: qty 1

## 2020-10-03 MED ORDER — CHLORHEXIDINE GLUCONATE CLOTH 2 % EX PADS
6.0000 | MEDICATED_PAD | Freq: Once | CUTANEOUS | Status: AC
  Administered 2020-10-03: 6 via TOPICAL

## 2020-10-03 NOTE — Progress Notes (Signed)
Pre cabg has been completed.   Preliminary results in CV Proc.   Blanch Media 10/03/2020 2:42 PM

## 2020-10-03 NOTE — Progress Notes (Signed)
CARDIAC REHAB PHASE I   Preop education completed with pt. Pt with IS and Cardiac surgery booklet at bedside. Has been through a valve replacement before and knows what to expect. Pts concerned about belongings. Denies any other questions or concerns at this time. Will continue to follow pt throughout his hospital stay.  [id number] Reynold Bowen, RN BSN 10/03/2020 10:02 AM

## 2020-10-03 NOTE — H&P (View-Only) (Signed)
5 Days Post-Op Procedure(s) (LRB): TRANSESOPHAGEAL ECHOCARDIOGRAM (TEE) (N/A) BUBBLE STUDY Subjective: No complaints. Anxious about surgery. Wife is with him.  Objective: Vital signs in last 24 hours: Temp:  [97.6 F (36.4 C)-99.4 F (37.4 C)] 97.6 F (36.4 C) (06/14 1421) Pulse Rate:  [78-82] 79 (06/14 1421) Cardiac Rhythm: Normal sinus rhythm (06/14 0908) Resp:  [15-18] 16 (06/14 1421) BP: (96-116)/(65-75) 116/68 (06/14 1421) SpO2:  [96 %-100 %] 96 % (06/14 1421)  Hemodynamic parameters for last 24 hours:    Intake/Output from previous day: 06/13 0701 - 06/14 0700 In: 920.4 [P.O.:720; IV Piggyback:200.4] Out: 400 [Urine:400] Intake/Output this shift: Total I/O In: 460 [P.O.:360; IV Piggyback:100] Out: -   General appearance: alert and cooperative Neurologic: intact Heart: regular rate and rhythm, S1, S2 normal, 2/6 systolic murmur RSB Lungs: clear to auscultation bilaterally  Lab Results: Recent Labs    10/02/20 0421 10/03/20 0158  WBC 8.2 9.0  HGB 10.8* 10.4*  HCT 32.4* 32.7*  PLT 243 404*   BMET:  Recent Labs    10/02/20 0421 10/02/20 0748 10/03/20 0158  NA 135  --  132*  K 3.5  --  4.1  CL 101  --  98  CO2 25  --  27  GLUCOSE 130*  --  166*  BUN 42*  --  11  CREATININE 3.19* 0.71 0.70  CALCIUM 8.8*  --  9.1    PT/INR: No results for input(s): LABPROT, INR in the last 72 hours. 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 Plan surgery tomorrow to remove infected material and replace valve/graft most likely with a homograft with reimplantation of coronaries. I discussed the operative  procedure with the patient and his wife including alternatives, benefits and risks; including but not limited to bleeding, blood transfusion, infection, stroke, myocardial infarction, graft failure, heart block requiring a permanent pacemaker, organ dysfunction, and death.  Ronnie Ruiz understands and agrees to proceed.    Camaria Gerald K Sonakshi Rolland 10/03/2020   

## 2020-10-03 NOTE — Progress Notes (Signed)
5 Days Post-Op Procedure(s) (LRB): TRANSESOPHAGEAL ECHOCARDIOGRAM (TEE) (N/A) BUBBLE STUDY Subjective: No complaints. Anxious about surgery. Wife is with him.  Objective: Vital signs in last 24 hours: Temp:  [97.6 F (36.4 C)-99.4 F (37.4 C)] 97.6 F (36.4 C) (06/14 1421) Pulse Rate:  [78-82] 79 (06/14 1421) Cardiac Rhythm: Normal sinus rhythm (06/14 0908) Resp:  [15-18] 16 (06/14 1421) BP: (96-116)/(65-75) 116/68 (06/14 1421) SpO2:  [96 %-100 %] 96 % (06/14 1421)  Hemodynamic parameters for last 24 hours:    Intake/Output from previous day: 06/13 0701 - 06/14 0700 In: 920.4 [P.O.:720; IV Piggyback:200.4] Out: 400 [Urine:400] Intake/Output this shift: Total I/O In: 460 [P.O.:360; IV Piggyback:100] Out: -   General appearance: alert and cooperative Neurologic: intact Heart: regular rate and rhythm, S1, S2 normal, 2/6 systolic murmur RSB Lungs: clear to auscultation bilaterally  Lab Results: Recent Labs    10/02/20 0421 10/03/20 0158  WBC 8.2 9.0  HGB 10.8* 10.4*  HCT 32.4* 32.7*  PLT 243 404*   BMET:  Recent Labs    10/02/20 0421 10/02/20 0748 10/03/20 0158  NA 135  --  132*  K 3.5  --  4.1  CL 101  --  98  CO2 25  --  27  GLUCOSE 130*  --  166*  BUN 42*  --  11  CREATININE 3.19* 0.71 0.70  CALCIUM 8.8*  --  9.1    PT/INR: No results for input(s): LABPROT, INR in the last 72 hours. 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 Plan surgery tomorrow to remove infected material and replace valve/graft most likely with a homograft with reimplantation of coronaries. I discussed the operative  procedure with the patient and his wife including alternatives, benefits and risks; including but not limited to bleeding, blood transfusion, infection, stroke, myocardial infarction, graft failure, heart block requiring a permanent pacemaker, organ dysfunction, and death.  [name] understands and agrees to proceed.    [name] 10/03/2020

## 2020-10-03 NOTE — Progress Notes (Signed)
PROGRESS NOTE    Ronnie Ruiz  GOT:[id number] DOB: [birth year] DOA: 09/26/2020 PCP: [name], MD    Brief Narrative:  Ronnie Ruiz is a 55 year old male with past medical history significant for essential hypertension, hyperlipidemia, type 2 diabetes mellitus, history of by cuspid aortic valve s/p bioprosthetic AVR 2020 who was found to have HAECK organism bacteremia at [facility] and was transferred to [facility] on 6/7 for TEE and ID consultation.  Patient was transferred from the cardiology service to [facility] on 09/28/2020.   Assessment & Plan:   Principal Problem:   Endocarditis Active Problems:   Hyperlipidemia   Essential hypertension   Alcohol dependency (HCC)   Heart murmur   Bicuspid aortic valve   S/P AVR   Fatigue   Bacteremia   Protein-calorie malnutrition, severe   Dental caries   Aortic valve abscess   Aggregatobacter (HAECK) septicemia, POA Patient initially presented to [facility] with fever, weakness and fatigue.  He was originally seen by outpatient cardiology on 09/15/2020 by Dr. Tomie China with reports of night sweats and fatigue and recommended to undergo a echocardiogram, blood cultures given AVR.  Patient was reported to have positive blood cultures and sent to the ED for further management.  Blood cultures from 09/15/2020 reportedly positive for aggregatibacter (HAECK organism), which is felt to be secondary to his poor dentition.  He was started on ceftriaxone on 09/20/2020.  TTE was unrevealing with LVEF 60 to 65%, mild LAE, mild MR/TR, and AV s/p replacement with mean gradient 14 mmHg, but cannot rule out endocarditis.  He was seen by Dr. Bing Matter in consultation given history of AVR, was recommended to transfer to [facility] for TEE and ID consultation.  TEE on 09/29/2018 with no definite evidence of acute endocarditis or valvular abscess, however some findings cannot be clearly distinguished from postoperative change.  CT coronary 09/29/2020  with findings consistent with perivalvular abscess posterior to the aortic valve bioprosthesis.  Peak blood cultures from 09/26/2020 with no growth x5 days and now finalized. --Cardiology and infectious disease following, appreciate assistance --Ceftriaxone 2 g IV every 24 hours, ID plan 6 weeks antibiotics following valve replacement --CBC daily, monitor fever curve  Severe AS with bicuspid aortic valve s/p bioprosthetic AVR 2020; now with perivalvular abscess CT coronary 09/29/2020 with findings consistent with perivalvular abscess posterior to the aortic valve bioprosthesis. --TCTS following; appreciate assistance --Continue antibiotics as above per ID. --NPO after MN for planned AV replacement on 10/04/2020  Poor dentition Seen by oral surgery, Dr. Barbette Merino on 10/01/2020, no need for immediate tooth extraction, but agrees all teeth will need to be removed at some point.  Essential hypertension Patient was noted to have intermittent hypotension at [facility].  On lisinopril and metoprolol at home. --BP 106/67 this morning --Continue metoprolol tartrate 25 mg p.o. twice daily --Continue to hold home lisinopril for now --Continue aspirin and statin --Continue monitor BP closely  Hyperlipidemia --Continue simvastatin 40 mg p.o. daily  Type 2 diabetes mellitus, with hyperglycemia Hemoglobin A1c 9.2 on 09/19/2020, poorly controlled.  On metformin 500 mg p.o. twice daily outpatient. --repeat HbA1c from 6/14: pending --Diabetic educator following, appreciate assistance --Hold home metformin while inpatient; would benefit from increase to 1000mg  twice daily on discharge as well as insulin --Lantus 20u [place] daily --Novolog 4u [place] TIDAC --SSI for coverage --CBGs before every meal/at bedtime --Diabetic educator consult  GERD: Continue PPI  Severe protein calorie malnutrition Body mass index is 28.34 kg/m.  Notable moderate muscle  depletion and 9% weight loss in 1 month. Nutrition  Status: Nutrition Problem: Severe Malnutrition Etiology: acute illness (endocarditis) Signs/Symptoms: moderate muscle depletion, percent weight loss (9% weight loss within 1 month) Percent weight loss: 9 % Interventions: MVI, Other (Comment) (Ensure Max) --Dietitian following, appreciate assistance. --Continue to encourage increased oral intake, supplements    DVT prophylaxis: heparin injection 5,000 Units Start: 09/26/20 2300    Code Status: Full Code Family Communication: No family present at bedside this morning, updated patients spouse Junious Dresser via telephone this am.  Disposition Plan:  Level of care: Telemetry Medical Status is: Inpatient  Remains inpatient appropriate because:Ongoing diagnostic testing needed not appropriate for outpatient work up, Unsafe d/c plan, IV treatments appropriate due to intensity of illness or inability to take PO, and Inpatient level of care appropriate due to severity of illness  Dispo: The patient is from: Home              Anticipated d/c is to: Home              Patient currently is not medically stable to d/c.   Difficult to place patient No  Consultants:  Cardiology Infectious disease Cardiothoracic surgery Oral surgery, Dr. Barbette Merino  Procedures:  TTE TEE Cardiac CTA  Antimicrobials:  Ceftriaxone   Subjective: Patient seen examined bedside, resting comfortably.  No specific complaints this morning.  Awaiting planned AV valve replacement/redo surgery tomorrow.  Updated patient's spouse via telephone this morning, she is requesting someone to call her after her husband surgery tomorrow.  Patient denies headache, no dizziness, no chest pain, palpitations, no shortness of breath, no fever/chills/night sweats, no nausea/vomiting/diarrhea, no weakness, no fatigue, no paresthesias.  No acute events overnight per nursing staff.  Objective: Vitals:   10/02/20 2018 10/02/20 2331 10/03/20 0425 10/03/20 0845  BP: 105/65 96/75 106/67 108/68   Pulse: 80 78 80 82  Resp: 17 16 15    Temp: 98.6 F (37 C) 99.4 F (37.4 C) 97.6 F (36.4 C)   TempSrc: Oral Oral Oral   SpO2: 98% 100% 100%   Weight:      Height:        Intake/Output Summary (Last 24 hours) at 10/03/2020 1003 Last data filed at 10/02/2020 1850 Gross per 24 hour  Intake 680.4 ml  Output --  Net 680.4 ml   Filed Weights   09/26/20 2121 09/27/20 0335 09/28/20 0333  Weight: 86 kg 85 kg 84.6 kg    Examination:  General exam: Appears calm and comfortable, poor dentition, moderate muscle wasting and fat depletion noted Respiratory system: Clear to auscultation. Respiratory effort normal.  On room air Cardiovascular system: S1 & S2 heard, RRR. No JVD, murmurs, rubs, gallops or clicks. No pedal edema. Gastrointestinal system: Abdomen is nondistended, soft and nontender. No organomegaly or masses felt. Normal bowel sounds heard. Central nervous system: Alert and oriented. No focal neurological deficits. Extremities: Symmetric 5 x 5 power. Skin: No rashes, lesions or ulcers Psychiatry: Judgement and insight appear normal. Mood & affect appropriate.     Data Reviewed: I have personally reviewed following labs and imaging studies  CBC: Recent Labs  Lab 09/29/20 0417 09/30/20 0220 10/01/20 0137 10/02/20 0421 10/03/20 0158  WBC 10.2 8.2 8.1 8.2 9.0  NEUTROABS 7.0 5.6 5.5 5.7 5.4  HGB 10.5* 9.6* 9.9* 10.8* 10.4*  HCT 32.6* 29.9* 30.0* 32.4* 32.7*  MCV 92.6 92.0 91.2 85.5 91.3  PLT 385 398 357 243 404*   Basic Metabolic Panel: Recent Labs  Lab 09/26/20 2223  09/27/20 [id number] 09/29/20 0417 09/30/20 0220 10/01/20 0137 10/02/20 0421 10/02/20 0748 10/03/20 0158  NA 132*   < > 135 132* 132* 135  --  132*  K 4.1   < > 4.4 4.1 4.1 3.5  --  4.1  CL 98   < > 102 101 97* 101  --  98  CO2 26   < > 25 23 26 25   --  27  GLUCOSE 222*   < > 214* 238* 208* 130*  --  166*  BUN 11   < > 11 8 7  42*  --  11  CREATININE 0.78   < > 0.72 0.63 0.71 3.19* 0.71 0.70   CALCIUM 8.9   < > 9.1 8.7* 9.0 8.8*  --  9.1  MG 1.9  --   --   --   --   --  1.9  --    < > = values in this interval not displayed.   GFR: Estimated Creatinine Clearance: 109.2 mL/min (by C-G formula based on SCr of 0.7 mg/dL). Liver Function Tests: Recent Labs  Lab 09/26/20 2223  AST 24  ALT 25  ALKPHOS 60  BILITOT 0.4  PROT 6.7  ALBUMIN 2.5*   No results for input(s): LIPASE, AMYLASE in the last 168 hours. No results for input(s): AMMONIA in the last 168 hours. Coagulation Profile: Recent Labs  Lab 09/26/20 2223  INR 1.0   Cardiac Enzymes: No results for input(s): CKTOTAL, CKMB, CKMBINDEX, TROPONINI in the last 168 hours. BNP (last 3 results) No results for input(s): PROBNP in the last 8760 hours. HbA1C: No results for input(s): HGBA1C in the last 72 hours. CBG: Recent Labs  Lab 10/02/20 0743 10/02/20 1146 10/02/20 1722 10/02/20 2023 10/03/20 0808  GLUCAP 185* 246* 230* 206* 175*   Lipid Profile: No results for input(s): CHOL, HDL, LDLCALC, TRIG, CHOLHDL, LDLDIRECT in the last 72 hours. Thyroid Function Tests: No results for input(s): TSH, T4TOTAL, FREET4, T3FREE, THYROIDAB in the last 72 hours. Anemia Panel: No results for input(s): VITAMINB12, FOLATE, FERRITIN, TIBC, IRON, RETICCTPCT in the last 72 hours. Sepsis Labs: No results for input(s): PROCALCITON, LATICACIDVEN in the last 168 hours.  Recent Results (from the past 240 hour(s))  Culture, blood (routine x 2)     Status: None   Collection Time: 09/26/20 11:48 PM   Specimen: BLOOD  Result Value Ref Range Status   Specimen Description BLOOD SITE NOT SPECIFIED  Final   Special Requests   Final    BOTTLES DRAWN AEROBIC AND ANAEROBIC Blood Culture results may not be optimal due to an excessive volume of blood received in culture bottles   Culture   Final    NO GROWTH 5 DAYS Performed at [facility], [address] [place], [place] [address]    Report Status 10/02/2020 FINAL  Final   Culture, blood (routine x 2)     Status: None   Collection Time: 09/26/20 11:48 PM   Specimen: BLOOD  Result Value Ref Range Status   Specimen Description BLOOD SITE NOT SPECIFIED  Final   Special Requests   Final    BOTTLES DRAWN AEROBIC AND ANAEROBIC Blood Culture adequate volume   Culture   Final    NO GROWTH 5 DAYS Performed at [facility], [address] [place], [place] [address]    Report Status 10/02/2020 FINAL  Final  SARS CORONAVIRUS 2 (TAT 6-24 HRS) Nasopharyngeal Nasopharyngeal Swab     Status: None  Collection Time: 09/27/20  3:20 AM   Specimen: Nasopharyngeal Swab  Result Value Ref Range Status   SARS Coronavirus 2 NEGATIVE NEGATIVE Final    Comment: (NOTE) SARS-CoV-2 target nucleic acids are NOT DETECTED.  The SARS-CoV-2 RNA is generally detectable in upper and lower respiratory specimens during the acute phase of infection. Negative results do not preclude SARS-CoV-2 infection, do not rule out co-infections with other pathogens, and should not be used as the sole basis for treatment or other patient management decisions. Negative results must be combined with clinical observations, patient history, and epidemiological information. The expected result is Negative.  Fact Sheet for Patients: HairSlick.no  Fact Sheet for Healthcare Providers: quierodirigir.com  This test is not yet approved or cleared by the [place] FDA and  has been authorized for detection and/or diagnosis of SARS-CoV-2 by FDA under an Emergency Use Authorization (EUA). This EUA will remain  in effect (meaning this test can be used) for the duration of the COVID-19 declaration under Se ction 564(b)(1) of the Act, 21 U.S.C. section 360bbb-3(b)(1), unless the authorization is terminated or revoked sooner.  Performed at [facility], [address], [place], [place] [address]          Radiology Studies: No  results found.      Scheduled Meds:  aspirin EC  81 mg Oral Daily   atorvastatin  80 mg Oral Daily   docusate sodium  100 mg Oral BID   heparin  5,000 Units Subcutaneous Q8H   insulin aspart  0-15 Units Subcutaneous TID WC   insulin aspart  0-5 Units Subcutaneous QHS   insulin aspart  4 Units Subcutaneous TID WC   insulin glargine  20 Units Subcutaneous Daily   metoprolol tartrate  25 mg Oral BID   multivitamin with minerals  1 tablet Oral Daily   pantoprazole  40 mg Oral Daily   Ensure Max Protein  11 oz Oral BID   Continuous Infusions:  cefTRIAXone (ROCEPHIN)  IV 2 g (10/03/20 0852)     LOS: 7 days    Time spent: 36 minutes spent on chart review, discussion with nursing staff, consultants, updating family and interview/physical exam; more than 50% of that time was spent in counseling and/or coordination of care.    Alvira Philips [place], DO [facility] Available via Epic secure chat 7am-7pm After these hours, please refer to coverage provider listed on [email or web address] 10/03/2020, 10:03 AM

## 2020-10-04 ENCOUNTER — Inpatient Hospital Stay (HOSPITAL_COMMUNITY): Payer: Managed Care, Other (non HMO) | Admitting: Vascular Surgery

## 2020-10-04 ENCOUNTER — Inpatient Hospital Stay (HOSPITAL_COMMUNITY): Payer: Managed Care, Other (non HMO)

## 2020-10-04 ENCOUNTER — Inpatient Hospital Stay (HOSPITAL_COMMUNITY)
Admission: AD | Disposition: A | Discharge status: Home Health | Payer: Self-pay | Source: Other Acute Inpatient Hospital | Attending: Internal Medicine

## 2020-10-04 DIAGNOSIS — I330 Acute and subacute infective endocarditis: Secondary | ICD-10-CM

## 2020-10-04 DIAGNOSIS — Z952 Presence of prosthetic heart valve: Secondary | ICD-10-CM

## 2020-10-04 DIAGNOSIS — T826XXA Infection and inflammatory reaction due to cardiac valve prosthesis, initial encounter: Secondary | ICD-10-CM

## 2020-10-04 HISTORY — PX: TEE WITHOUT CARDIOVERSION: SHX5443

## 2020-10-04 HISTORY — PX: ENDOVEIN HARVEST OF GREATER SAPHENOUS VEIN: SHX5059

## 2020-10-04 HISTORY — PX: BENTALL PROCEDURE: SHX5058

## 2020-10-04 HISTORY — DX: Presence of prosthetic heart valve: Z95.2

## 2020-10-04 LAB — POCT I-STAT, CHEM 8
BUN: 11 mg/dL (ref 6–20)
BUN: 10 mg/dL (ref 6–20)
BUN: 10 mg/dL (ref 6–20)
BUN: 9 mg/dL (ref 6–20)
BUN: 10 mg/dL (ref 6–20)
BUN: 9 mg/dL (ref 6–20)
BUN: 9 mg/dL (ref 6–20)
BUN: 9 mg/dL (ref 6–20)
BUN: 9 mg/dL (ref 6–20)
BUN: 8 mg/dL (ref 6–20)
BUN: 9 mg/dL (ref 6–20)
BUN: 9 mg/dL (ref 6–20)
Calcium, Ion: 1.25 mmol/L (ref 1.15–1.40)
Calcium, Ion: 1.23 mmol/L (ref 1.15–1.40)
Calcium, Ion: 1.26 mmol/L (ref 1.15–1.40)
Calcium, Ion: 1.1 mmol/L — ABNORMAL LOW (ref 1.15–1.40)
Calcium, Ion: 0.96 mmol/L — ABNORMAL LOW (ref 1.15–1.40)
Calcium, Ion: 1.11 mmol/L — ABNORMAL LOW (ref 1.15–1.40)
Calcium, Ion: 0.99 mmol/L — ABNORMAL LOW (ref 1.15–1.40)
Calcium, Ion: 1.06 mmol/L — ABNORMAL LOW (ref 1.15–1.40)
Calcium, Ion: 1 mmol/L — ABNORMAL LOW (ref 1.15–1.40)
Calcium, Ion: 0.92 mmol/L — ABNORMAL LOW (ref 1.15–1.40)
Calcium, Ion: 1 mmol/L — ABNORMAL LOW (ref 1.15–1.40)
Calcium, Ion: 0.98 mmol/L — ABNORMAL LOW (ref 1.15–1.40)
Chloride: 100 mmol/L (ref 98–111)
Chloride: 100 mmol/L (ref 98–111)
Chloride: 102 mmol/L (ref 98–111)
Chloride: 100 mmol/L (ref 98–111)
Chloride: 102 mmol/L (ref 98–111)
Chloride: 99 mmol/L (ref 98–111)
Chloride: 101 mmol/L (ref 98–111)
Chloride: 102 mmol/L (ref 98–111)
Chloride: 101 mmol/L (ref 98–111)
Chloride: 101 mmol/L (ref 98–111)
Chloride: 102 mmol/L (ref 98–111)
Chloride: 102 mmol/L (ref 98–111)
Creatinine, Ser: 0.4 mg/dL — ABNORMAL LOW (ref 0.61–1.24)
Creatinine, Ser: 0.4 mg/dL — ABNORMAL LOW (ref 0.61–1.24)
Creatinine, Ser: 0.4 mg/dL — ABNORMAL LOW (ref 0.61–1.24)
Creatinine, Ser: 0.3 mg/dL — ABNORMAL LOW (ref 0.61–1.24)
Creatinine, Ser: 0.4 mg/dL — ABNORMAL LOW (ref 0.61–1.24)
Creatinine, Ser: 0.6 mg/dL — ABNORMAL LOW (ref 0.61–1.24)
Creatinine, Ser: 0.4 mg/dL — ABNORMAL LOW (ref 0.61–1.24)
Creatinine, Ser: 0.4 mg/dL — ABNORMAL LOW (ref 0.61–1.24)
Creatinine, Ser: 0.3 mg/dL — ABNORMAL LOW (ref 0.61–1.24)
Creatinine, Ser: 0.3 mg/dL — ABNORMAL LOW (ref 0.61–1.24)
Creatinine, Ser: 0.3 mg/dL — ABNORMAL LOW (ref 0.61–1.24)
Creatinine, Ser: 0.3 mg/dL — ABNORMAL LOW (ref 0.61–1.24)
Glucose, Bld: 170 mg/dL — ABNORMAL HIGH (ref 70–99)
Glucose, Bld: 132 mg/dL — ABNORMAL HIGH (ref 70–99)
Glucose, Bld: 158 mg/dL — ABNORMAL HIGH (ref 70–99)
Glucose, Bld: 103 mg/dL — ABNORMAL HIGH (ref 70–99)
Glucose, Bld: 134 mg/dL — ABNORMAL HIGH (ref 70–99)
Glucose, Bld: 146 mg/dL — ABNORMAL HIGH (ref 70–99)
Glucose, Bld: 153 mg/dL — ABNORMAL HIGH (ref 70–99)
Glucose, Bld: 148 mg/dL — ABNORMAL HIGH (ref 70–99)
Glucose, Bld: 146 mg/dL — ABNORMAL HIGH (ref 70–99)
Glucose, Bld: 152 mg/dL — ABNORMAL HIGH (ref 70–99)
Glucose, Bld: 153 mg/dL — ABNORMAL HIGH (ref 70–99)
Glucose, Bld: 164 mg/dL — ABNORMAL HIGH (ref 70–99)
HCT: 26 % — ABNORMAL LOW (ref 39.0–52.0)
HCT: 27 % — ABNORMAL LOW (ref 39.0–52.0)
HCT: 27 % — ABNORMAL LOW (ref 39.0–52.0)
HCT: 26 % — ABNORMAL LOW (ref 39.0–52.0)
HCT: 22 % — ABNORMAL LOW (ref 39.0–52.0)
HCT: 25 % — ABNORMAL LOW (ref 39.0–52.0)
HCT: 25 % — ABNORMAL LOW (ref 39.0–52.0)
HCT: 26 % — ABNORMAL LOW (ref 39.0–52.0)
HCT: 25 % — ABNORMAL LOW (ref 39.0–52.0)
HCT: 22 % — ABNORMAL LOW (ref 39.0–52.0)
HCT: 24 % — ABNORMAL LOW (ref 39.0–52.0)
HCT: 28 % — ABNORMAL LOW (ref 39.0–52.0)
Hemoglobin: 8.8 g/dL — ABNORMAL LOW (ref 13.0–17.0)
Hemoglobin: 9.2 g/dL — ABNORMAL LOW (ref 13.0–17.0)
Hemoglobin: 9.2 g/dL — ABNORMAL LOW (ref 13.0–17.0)
Hemoglobin: 8.8 g/dL — ABNORMAL LOW (ref 13.0–17.0)
Hemoglobin: 7.5 g/dL — ABNORMAL LOW (ref 13.0–17.0)
Hemoglobin: 8.5 g/dL — ABNORMAL LOW (ref 13.0–17.0)
Hemoglobin: 8.5 g/dL — ABNORMAL LOW (ref 13.0–17.0)
Hemoglobin: 8.8 g/dL — ABNORMAL LOW (ref 13.0–17.0)
Hemoglobin: 8.5 g/dL — ABNORMAL LOW (ref 13.0–17.0)
Hemoglobin: 7.5 g/dL — ABNORMAL LOW (ref 13.0–17.0)
Hemoglobin: 8.2 g/dL — ABNORMAL LOW (ref 13.0–17.0)
Hemoglobin: 9.5 g/dL — ABNORMAL LOW (ref 13.0–17.0)
Potassium: 4.1 mmol/L (ref 3.5–5.1)
Potassium: 4.1 mmol/L (ref 3.5–5.1)
Potassium: 4.1 mmol/L (ref 3.5–5.1)
Potassium: 4.9 mmol/L (ref 3.5–5.1)
Potassium: 3.7 mmol/L (ref 3.5–5.1)
Potassium: 6 mmol/L — ABNORMAL HIGH (ref 3.5–5.1)
Potassium: 3.1 mmol/L — ABNORMAL LOW (ref 3.5–5.1)
Potassium: 3.3 mmol/L — ABNORMAL LOW (ref 3.5–5.1)
Potassium: 3.6 mmol/L (ref 3.5–5.1)
Potassium: 3.9 mmol/L (ref 3.5–5.1)
Potassium: 4.6 mmol/L (ref 3.5–5.1)
Potassium: 3.9 mmol/L (ref 3.5–5.1)
Sodium: 137 mmol/L (ref 135–145)
Sodium: 135 mmol/L (ref 135–145)
Sodium: 136 mmol/L (ref 135–145)
Sodium: 138 mmol/L (ref 135–145)
Sodium: 136 mmol/L (ref 135–145)
Sodium: 137 mmol/L (ref 135–145)
Sodium: 137 mmol/L (ref 135–145)
Sodium: 138 mmol/L (ref 135–145)
Sodium: 139 mmol/L (ref 135–145)
Sodium: 137 mmol/L (ref 135–145)
Sodium: 139 mmol/L (ref 135–145)
Sodium: 139 mmol/L (ref 135–145)
TCO2: 27 mmol/L (ref 22–32)
TCO2: 26 mmol/L (ref 22–32)
TCO2: 27 mmol/L (ref 22–32)
TCO2: 26 mmol/L (ref 22–32)
TCO2: 26 mmol/L (ref 22–32)
TCO2: 28 mmol/L (ref 22–32)
TCO2: 25 mmol/L (ref 22–32)
TCO2: 27 mmol/L (ref 22–32)
TCO2: 26 mmol/L (ref 22–32)
TCO2: 26 mmol/L (ref 22–32)
TCO2: 27 mmol/L (ref 22–32)
TCO2: 25 mmol/L (ref 22–32)

## 2020-10-04 LAB — POCT I-STAT EG7
Acid-Base Excess: 2 mmol/L (ref 0.0–2.0)
Bicarbonate: 27.5 mmol/L (ref 20.0–28.0)
Calcium, Ion: 1.01 mmol/L — ABNORMAL LOW (ref 1.15–1.40)
HCT: 22 % — ABNORMAL LOW (ref 39.0–52.0)
Hemoglobin: 7.5 g/dL — ABNORMAL LOW (ref 13.0–17.0)
O2 Saturation: 73 %
Potassium: 3.8 mmol/L (ref 3.5–5.1)
Sodium: 139 mmol/L (ref 135–145)
TCO2: 29 mmol/L (ref 22–32)
pCO2, Ven: 47.8 mmHg (ref 44.0–60.0)
pH, Ven: 7.368 (ref 7.250–7.430)
pO2, Ven: 40 mmHg (ref 32.0–45.0)

## 2020-10-04 LAB — CBC
HCT: 32.2 % — ABNORMAL LOW (ref 39.0–52.0)
HCT: 22.2 % — ABNORMAL LOW (ref 39.0–52.0)
Hemoglobin: 10.5 g/dL — ABNORMAL LOW (ref 13.0–17.0)
Hemoglobin: 7.4 g/dL — ABNORMAL LOW (ref 13.0–17.0)
MCH: 29.8 pg (ref 26.0–34.0)
MCH: 29.7 pg (ref 26.0–34.0)
MCHC: 32.6 g/dL (ref 30.0–36.0)
MCHC: 33.3 g/dL (ref 30.0–36.0)
MCV: 91.5 fL (ref 80.0–100.0)
MCV: 89.2 fL (ref 80.0–100.0)
Platelets: 391 10*3/uL (ref 150–400)
Platelets: 133 10*3/uL — ABNORMAL LOW (ref 150–400)
RBC: 3.52 MIL/uL — ABNORMAL LOW (ref 4.22–5.81)
RBC: 2.49 MIL/uL — ABNORMAL LOW (ref 4.22–5.81)
RDW: 14.2 % (ref 11.5–15.5)
RDW: 16.5 % — ABNORMAL HIGH (ref 11.5–15.5)
WBC: 9.3 10*3/uL (ref 4.0–10.5)
WBC: 10.8 10*3/uL — ABNORMAL HIGH (ref 4.0–10.5)
nRBC: 0 % (ref 0.0–0.2)
nRBC: 0 % (ref 0.0–0.2)

## 2020-10-04 LAB — POCT I-STAT 7, (LYTES, BLD GAS, ICA,H+H)
Acid-Base Excess: 3 mmol/L — ABNORMAL HIGH (ref 0.0–2.0)
Acid-Base Excess: 3 mmol/L — ABNORMAL HIGH (ref 0.0–2.0)
Acid-Base Excess: 4 mmol/L — ABNORMAL HIGH (ref 0.0–2.0)
Acid-Base Excess: 1 mmol/L (ref 0.0–2.0)
Acid-Base Excess: 3 mmol/L — ABNORMAL HIGH (ref 0.0–2.0)
Acid-Base Excess: 2 mmol/L (ref 0.0–2.0)
Acid-Base Excess: 0 mmol/L (ref 0.0–2.0)
Acid-base deficit: 2 mmol/L (ref 0.0–2.0)
Bicarbonate: 28.4 mmol/L — ABNORMAL HIGH (ref 20.0–28.0)
Bicarbonate: 28.5 mmol/L — ABNORMAL HIGH (ref 20.0–28.0)
Bicarbonate: 29.3 mmol/L — ABNORMAL HIGH (ref 20.0–28.0)
Bicarbonate: 25.7 mmol/L (ref 20.0–28.0)
Bicarbonate: 25.7 mmol/L (ref 20.0–28.0)
Bicarbonate: 27.5 mmol/L (ref 20.0–28.0)
Bicarbonate: 26.7 mmol/L (ref 20.0–28.0)
Bicarbonate: 26 mmol/L (ref 20.0–28.0)
Calcium, Ion: 1.24 mmol/L (ref 1.15–1.40)
Calcium, Ion: 1.02 mmol/L — ABNORMAL LOW (ref 1.15–1.40)
Calcium, Ion: 1.11 mmol/L — ABNORMAL LOW (ref 1.15–1.40)
Calcium, Ion: 1.11 mmol/L — ABNORMAL LOW (ref 1.15–1.40)
Calcium, Ion: 1 mmol/L — ABNORMAL LOW (ref 1.15–1.40)
Calcium, Ion: 1 mmol/L — ABNORMAL LOW (ref 1.15–1.40)
Calcium, Ion: 0.98 mmol/L — ABNORMAL LOW (ref 1.15–1.40)
Calcium, Ion: 1.03 mmol/L — ABNORMAL LOW (ref 1.15–1.40)
HCT: 30 % — ABNORMAL LOW (ref 39.0–52.0)
HCT: 24 % — ABNORMAL LOW (ref 39.0–52.0)
HCT: 26 % — ABNORMAL LOW (ref 39.0–52.0)
HCT: 23 % — ABNORMAL LOW (ref 39.0–52.0)
HCT: 24 % — ABNORMAL LOW (ref 39.0–52.0)
HCT: 24 % — ABNORMAL LOW (ref 39.0–52.0)
HCT: 25 % — ABNORMAL LOW (ref 39.0–52.0)
HCT: 23 % — ABNORMAL LOW (ref 39.0–52.0)
Hemoglobin: 10.2 g/dL — ABNORMAL LOW (ref 13.0–17.0)
Hemoglobin: 8.2 g/dL — ABNORMAL LOW (ref 13.0–17.0)
Hemoglobin: 8.8 g/dL — ABNORMAL LOW (ref 13.0–17.0)
Hemoglobin: 7.8 g/dL — ABNORMAL LOW (ref 13.0–17.0)
Hemoglobin: 8.2 g/dL — ABNORMAL LOW (ref 13.0–17.0)
Hemoglobin: 8.2 g/dL — ABNORMAL LOW (ref 13.0–17.0)
Hemoglobin: 8.5 g/dL — ABNORMAL LOW (ref 13.0–17.0)
Hemoglobin: 7.8 g/dL — ABNORMAL LOW (ref 13.0–17.0)
O2 Saturation: 100 %
O2 Saturation: 100 %
O2 Saturation: 100 %
O2 Saturation: 89 %
O2 Saturation: 100 %
O2 Saturation: 100 %
O2 Saturation: 100 %
O2 Saturation: 97 %
Patient temperature: 35
Potassium: 4.1 mmol/L (ref 3.5–5.1)
Potassium: 4.3 mmol/L (ref 3.5–5.1)
Potassium: 4.5 mmol/L (ref 3.5–5.1)
Potassium: 6 mmol/L — ABNORMAL HIGH (ref 3.5–5.1)
Potassium: 3.8 mmol/L (ref 3.5–5.1)
Potassium: 4.4 mmol/L (ref 3.5–5.1)
Potassium: 3.9 mmol/L (ref 3.5–5.1)
Potassium: 3.9 mmol/L (ref 3.5–5.1)
Sodium: 137 mmol/L (ref 135–145)
Sodium: 139 mmol/L (ref 135–145)
Sodium: 138 mmol/L (ref 135–145)
Sodium: 137 mmol/L (ref 135–145)
Sodium: 135 mmol/L (ref 135–145)
Sodium: 139 mmol/L (ref 135–145)
Sodium: 140 mmol/L (ref 135–145)
Sodium: 141 mmol/L (ref 135–145)
TCO2: 30 mmol/L (ref 22–32)
TCO2: 30 mmol/L (ref 22–32)
TCO2: 31 mmol/L (ref 22–32)
TCO2: 28 mmol/L (ref 22–32)
TCO2: 27 mmol/L (ref 22–32)
TCO2: 29 mmol/L (ref 22–32)
TCO2: 28 mmol/L (ref 22–32)
TCO2: 27 mmol/L (ref 22–32)
pCO2 arterial: 46.4 mmHg (ref 32.0–48.0)
pCO2 arterial: 45 mmHg (ref 32.0–48.0)
pCO2 arterial: 48.5 mmHg — ABNORMAL HIGH (ref 32.0–48.0)
pCO2 arterial: 61.9 mmHg — ABNORMAL HIGH (ref 32.0–48.0)
pCO2 arterial: 38.8 mmHg (ref 32.0–48.0)
pCO2 arterial: 42.4 mmHg (ref 32.0–48.0)
pCO2 arterial: 43.1 mmHg (ref 32.0–48.0)
pCO2 arterial: 43.5 mmHg (ref 32.0–48.0)
pH, Arterial: 7.396 (ref 7.350–7.450)
pH, Arterial: 7.409 (ref 7.350–7.450)
pH, Arterial: 7.39 (ref 7.350–7.450)
pH, Arterial: 7.227 — ABNORMAL LOW (ref 7.350–7.450)
pH, Arterial: 7.43 (ref 7.350–7.450)
pH, Arterial: 7.42 (ref 7.350–7.450)
pH, Arterial: 7.4 (ref 7.350–7.450)
pH, Arterial: 7.375 (ref 7.350–7.450)
pO2, Arterial: 406 mmHg — ABNORMAL HIGH (ref 83.0–108.0)
pO2, Arterial: 362 mmHg — ABNORMAL HIGH (ref 83.0–108.0)
pO2, Arterial: 354 mmHg — ABNORMAL HIGH (ref 83.0–108.0)
pO2, Arterial: 69 mmHg — ABNORMAL LOW (ref 83.0–108.0)
pO2, Arterial: 350 mmHg — ABNORMAL HIGH (ref 83.0–108.0)
pO2, Arterial: 289 mmHg — ABNORMAL HIGH (ref 83.0–108.0)
pO2, Arterial: 437 mmHg — ABNORMAL HIGH (ref 83.0–108.0)
pO2, Arterial: 81 mmHg — ABNORMAL LOW (ref 83.0–108.0)

## 2020-10-04 LAB — BASIC METABOLIC PANEL
Anion gap: 8 (ref 5–15)
BUN: 11 mg/dL (ref 6–20)
CO2: 27 mmol/L (ref 22–32)
Calcium: 9.2 mg/dL (ref 8.9–10.3)
Chloride: 100 mmol/L (ref 98–111)
Creatinine, Ser: 0.78 mg/dL (ref 0.61–1.24)
GFR, Estimated: >60 mL/min (ref >60)
Glucose, Bld: 191 mg/dL — ABNORMAL HIGH (ref 70–99)
Potassium: 4.2 mmol/L (ref 3.5–5.1)
Sodium: 135 mmol/L (ref 135–145)

## 2020-10-04 LAB — PROTIME-INR
INR: 1.4 — ABNORMAL HIGH (ref 0.8–1.2)
Prothrombin Time: 17.2 seconds — ABNORMAL HIGH (ref 11.4–15.2)

## 2020-10-04 LAB — HEMOGLOBIN A1C
Hgb A1c MFr Bld: 8.7 % — ABNORMAL HIGH (ref 4.8–5.6)
Mean Plasma Glucose: 203 mg/dL

## 2020-10-04 LAB — GLUCOSE, CAPILLARY
Glucose-Capillary: 137 mg/dL — ABNORMAL HIGH (ref 70–99)
Glucose-Capillary: 109 mg/dL — ABNORMAL HIGH (ref 70–99)
Glucose-Capillary: 161 mg/dL — ABNORMAL HIGH (ref 70–99)

## 2020-10-04 LAB — FIBRINOGEN: Fibrinogen: 360 mg/dL (ref 210–475)

## 2020-10-04 LAB — HEMOGLOBIN AND HEMATOCRIT, BLOOD
HCT: 21.4 % — ABNORMAL LOW (ref 39.0–52.0)
Hemoglobin: 7 g/dL — ABNORMAL LOW (ref 13.0–17.0)

## 2020-10-04 LAB — APTT: aPTT: 36 seconds (ref 24–36)

## 2020-10-04 LAB — PREPARE RBC (CROSSMATCH)

## 2020-10-04 LAB — PLATELET COUNT: Platelets: 153 10*3/uL (ref 150–400)

## 2020-10-04 SURGERY — REDO STERNOTOMY
Anesthesia: General | Site: Chest | Laterality: Right

## 2020-10-04 MED ORDER — ACETAMINOPHEN 160 MG/5ML PO SOLN
1000.0000 mg | Freq: Four times a day (QID) | ORAL | Status: DC
  Administered 2020-10-05 – 2020-10-06 (×4): 1000 mg
  Filled 2020-10-04 (×3): qty 40.6

## 2020-10-04 MED ORDER — MIDAZOLAM HCL (PF) 10 MG/2ML IJ SOLN
INTRAMUSCULAR | Status: AC
  Filled 2020-10-04: qty 2

## 2020-10-04 MED ORDER — LACTATED RINGERS IV SOLN: 500.0000 mL | Freq: Once | INTRAVENOUS | Status: DC | PRN

## 2020-10-04 MED ORDER — LACTATED RINGERS IV SOLN: INTRAVENOUS | Status: DC

## 2020-10-04 MED ORDER — CHLORHEXIDINE GLUCONATE 0.12 % MT SOLN
15.0000 mL | OROMUCOSAL | Status: AC
  Administered 2020-10-04: 15 mL via OROMUCOSAL

## 2020-10-04 MED ORDER — METOPROLOL TARTRATE 25 MG/10 ML ORAL SUSPENSION: 12.5000 mg | Freq: Two times a day (BID) | ORAL | Status: DC

## 2020-10-04 MED ORDER — 0.9 % SODIUM CHLORIDE (POUR BTL) OPTIME
TOPICAL | Status: DC | PRN
  Administered 2020-10-04: 1000 mL
  Administered 2020-10-04: 3000 mL
  Administered 2020-10-04: 5000 mL

## 2020-10-04 MED ORDER — PROTAMINE SULFATE 10 MG/ML IV SOLN
INTRAVENOUS | Status: AC
  Filled 2020-10-04: qty 25

## 2020-10-04 MED ORDER — ALBUMIN HUMAN 5 % IV SOLN: INTRAVENOUS | Status: DC | PRN

## 2020-10-04 MED ORDER — TRAMADOL HCL 50 MG PO TABS: 50.0000 mg | ORAL_TABLET | ORAL | Status: DC | PRN

## 2020-10-04 MED ORDER — MIDAZOLAM HCL 2 MG/2ML IJ SOLN
INTRAMUSCULAR | Status: AC
  Administered 2020-10-04: 2 mg via INTRAVENOUS
  Filled 2020-10-04: qty 2

## 2020-10-04 MED ORDER — MORPHINE SULFATE (PF) 2 MG/ML IV SOLN
1.0000 mg | INTRAVENOUS | Status: DC | PRN
  Administered 2020-10-05 – 2020-10-06 (×8): 2 mg via INTRAVENOUS
  Filled 2020-10-04 (×2): qty 1
  Filled 2020-10-04 (×2): qty 2
  Filled 2020-10-04 (×2): qty 1

## 2020-10-04 MED ORDER — VANCOMYCIN HCL IN DEXTROSE 1-5 GM/200ML-% IV SOLN
1000.0000 mg | Freq: Once | INTRAVENOUS | Status: AC
  Administered 2020-10-05: 1000 mg via INTRAVENOUS
  Filled 2020-10-04: qty 200

## 2020-10-04 MED ORDER — BISACODYL 5 MG PO TBEC
10.0000 mg | DELAYED_RELEASE_TABLET | Freq: Every day | ORAL | Status: DC
  Administered 2020-10-06 – 2020-10-10 (×5): 10 mg via ORAL
  Filled 2020-10-04 (×5): qty 2

## 2020-10-04 MED ORDER — SODIUM CHLORIDE 0.9 % IV SOLN: 250.0000 mL | INTRAVENOUS | Status: DC

## 2020-10-04 MED ORDER — MORPHINE SULFATE (PF) 4 MG/ML IV SOLN
INTRAVENOUS | Status: AC
  Administered 2020-10-04: 4 mg
  Filled 2020-10-04: qty 1

## 2020-10-04 MED ORDER — LACTATED RINGERS IV SOLN: INTRAVENOUS | Status: DC | PRN

## 2020-10-04 MED ORDER — SODIUM CHLORIDE 0.45 % IV SOLN: INTRAVENOUS | Status: DC | PRN

## 2020-10-04 MED ORDER — ONDANSETRON HCL 4 MG/2ML IJ SOLN
4.0000 mg | Freq: Four times a day (QID) | INTRAMUSCULAR | Status: DC | PRN
  Administered 2020-10-05: 4 mg via INTRAVENOUS
  Filled 2020-10-04: qty 2

## 2020-10-04 MED ORDER — SODIUM CHLORIDE 0.9% FLUSH
3.0000 mL | Freq: Two times a day (BID) | INTRAVENOUS | Status: DC
  Administered 2020-10-05 – 2020-10-10 (×7): 3 mL via INTRAVENOUS

## 2020-10-04 MED ORDER — EPINEPHRINE HCL 5 MG/250ML IV SOLN IN NS: 0.0000 ug/min | INTRAVENOUS | Status: DC

## 2020-10-04 MED ORDER — PROTAMINE SULFATE 10 MG/ML IV SOLN
INTRAVENOUS | Status: DC | PRN
  Administered 2020-10-04: 300 mg via INTRAVENOUS

## 2020-10-04 MED ORDER — ROCURONIUM BROMIDE 10 MG/ML (PF) SYRINGE
PREFILLED_SYRINGE | INTRAVENOUS | Status: DC | PRN
  Administered 2020-10-04: 50 mg via INTRAVENOUS
  Administered 2020-10-04: 80 mg via INTRAVENOUS
  Administered 2020-10-04: 60 mg via INTRAVENOUS
  Administered 2020-10-04 (×2): 50 mg via INTRAVENOUS

## 2020-10-04 MED ORDER — FENTANYL CITRATE (PF) 250 MCG/5ML IJ SOLN
INTRAMUSCULAR | Status: DC | PRN
  Administered 2020-10-04: 200 ug via INTRAVENOUS
  Administered 2020-10-04 (×2): 50 ug via INTRAVENOUS
  Administered 2020-10-04: 200 ug via INTRAVENOUS
  Administered 2020-10-04: 50 ug via INTRAVENOUS
  Administered 2020-10-04 (×2): 100 ug via INTRAVENOUS
  Administered 2020-10-04: 650 ug via INTRAVENOUS
  Administered 2020-10-04: 50 ug via INTRAVENOUS
  Administered 2020-10-04: 150 ug via INTRAVENOUS
  Administered 2020-10-04: 100 ug via INTRAVENOUS
  Administered 2020-10-04: 50 ug via INTRAVENOUS

## 2020-10-04 MED ORDER — ASPIRIN EC 325 MG PO TBEC
325.0000 mg | DELAYED_RELEASE_TABLET | Freq: Every day | ORAL | Status: DC
  Administered 2020-10-05 – 2020-10-10 (×6): 325 mg via ORAL
  Filled 2020-10-04 (×6): qty 1

## 2020-10-04 MED ORDER — MANNITOL 20 % IV SOLN
Freq: Once | INTRAVENOUS | Status: DC
  Filled 2020-10-04 (×2): qty 13

## 2020-10-04 MED ORDER — SODIUM CHLORIDE 0.9 % IV SOLN: INTRAVENOUS | Status: DC

## 2020-10-04 MED ORDER — PROTAMINE SULFATE 10 MG/ML IV SOLN
INTRAVENOUS | Status: AC
  Filled 2020-10-04: qty 5

## 2020-10-04 MED ORDER — METOPROLOL TARTRATE 12.5 MG HALF TABLET: 12.5000 mg | ORAL_TABLET | Freq: Two times a day (BID) | ORAL | Status: DC

## 2020-10-04 MED ORDER — SODIUM CHLORIDE 0.9 % IV SOLN
2.0000 g | INTRAVENOUS | Status: DC
  Administered 2020-10-04 – 2020-10-10 (×7): 2 g via INTRAVENOUS
  Filled 2020-10-04 (×2): qty 2
  Filled 2020-10-04: qty 20
  Filled 2020-10-04: qty 2
  Filled 2020-10-04: qty 20
  Filled 2020-10-04 (×2): qty 2
  Filled 2020-10-04: qty 20

## 2020-10-04 MED ORDER — MIDAZOLAM HCL 2 MG/2ML IJ SOLN
INTRAMUSCULAR | Status: DC | PRN
  Administered 2020-10-04: 2 mg via INTRAVENOUS
  Administered 2020-10-04: 3 mg via INTRAVENOUS
  Administered 2020-10-04 (×5): 1 mg via INTRAVENOUS

## 2020-10-04 MED ORDER — FENTANYL CITRATE (PF) 250 MCG/5ML IJ SOLN
INTRAMUSCULAR | Status: AC
  Filled 2020-10-04: qty 25

## 2020-10-04 MED ORDER — SODIUM CHLORIDE 0.9% FLUSH: 3.0000 mL | INTRAVENOUS | Status: DC | PRN

## 2020-10-04 MED ORDER — LIDOCAINE HCL (PF) 2 % IJ SOLN
INTRAMUSCULAR | Status: AC
  Filled 2020-10-04: qty 5

## 2020-10-04 MED ORDER — HEPARIN SODIUM (PORCINE) 1000 UNIT/ML IJ SOLN
INTRAMUSCULAR | Status: DC | PRN
  Administered 2020-10-04: 30000 [IU] via INTRAVENOUS

## 2020-10-04 MED ORDER — OXYCODONE HCL 5 MG PO TABS
5.0000 mg | ORAL_TABLET | ORAL | Status: DC | PRN
  Administered 2020-10-05: 10 mg via ORAL
  Filled 2020-10-04 (×2): qty 2

## 2020-10-04 MED ORDER — DEXTROSE 50 % IV SOLN: 0.0000 mL | INTRAVENOUS | Status: DC | PRN

## 2020-10-04 MED ORDER — HYDROCORTISONE NA SUCCINATE PF 100 MG IJ SOLR
INTRAMUSCULAR | Status: DC | PRN
  Administered 2020-10-04: 500 mg via INTRAVENOUS

## 2020-10-04 MED ORDER — THROMBIN 20000 UNITS EX SOLR
OROMUCOSAL | Status: DC | PRN
  Administered 2020-10-04: 20 mL via TOPICAL

## 2020-10-04 MED ORDER — MIDAZOLAM HCL 2 MG/2ML IJ SOLN
2.0000 mg | INTRAMUSCULAR | Status: DC | PRN
  Administered 2020-10-04 – 2020-10-05 (×7): 2 mg via INTRAVENOUS
  Filled 2020-10-04 (×7): qty 2

## 2020-10-04 MED ORDER — CHLORHEXIDINE GLUCONATE CLOTH 2 % EX PADS
6.0000 | MEDICATED_PAD | Freq: Every day | CUTANEOUS | Status: DC
  Administered 2020-10-05 – 2020-10-10 (×6): 6 via TOPICAL

## 2020-10-04 MED ORDER — ARTIFICIAL TEARS OPHTHALMIC OINT
TOPICAL_OINTMENT | OPHTHALMIC | Status: DC | PRN
  Administered 2020-10-04: 1 via OPHTHALMIC

## 2020-10-04 MED ORDER — HEMOSTATIC AGENTS (NO CHARGE) OPTIME
TOPICAL | Status: DC | PRN
  Administered 2020-10-04: 1 via TOPICAL

## 2020-10-04 MED ORDER — ROCURONIUM BROMIDE 10 MG/ML (PF) SYRINGE
PREFILLED_SYRINGE | INTRAVENOUS | Status: AC
  Filled 2020-10-04: qty 20

## 2020-10-04 MED ORDER — PROPOFOL 10 MG/ML IV BOLUS
INTRAVENOUS | Status: AC
  Filled 2020-10-04: qty 40

## 2020-10-04 MED ORDER — PROTAMINE SULFATE 10 MG/ML IV SOLN: INTRAVENOUS | Status: DC | PRN

## 2020-10-04 MED ORDER — PROPOFOL 10 MG/ML IV BOLUS
INTRAVENOUS | Status: DC | PRN
  Administered 2020-10-04: 20 mg via INTRAVENOUS
  Administered 2020-10-04: 250 mg via INTRAVENOUS

## 2020-10-04 MED ORDER — FAMOTIDINE IN NACL 20-0.9 MG/50ML-% IV SOLN
20.0000 mg | Freq: Two times a day (BID) | INTRAVENOUS | Status: AC
  Administered 2020-10-04 – 2020-10-05 (×2): 20 mg via INTRAVENOUS
  Filled 2020-10-04 (×2): qty 50

## 2020-10-04 MED ORDER — ALBUMIN HUMAN 5 % IV SOLN
250.0000 mL | INTRAVENOUS | Status: AC | PRN
  Administered 2020-10-04 – 2020-10-05 (×3): 12.5 g via INTRAVENOUS
  Filled 2020-10-04: qty 250

## 2020-10-04 MED ORDER — HEPARIN SODIUM (PORCINE) 1000 UNIT/ML IJ SOLN
INTRAMUSCULAR | Status: AC
  Filled 2020-10-04: qty 1

## 2020-10-04 MED ORDER — HEMOSTATIC AGENTS (NO CHARGE) OPTIME
TOPICAL | Status: DC | PRN
  Administered 2020-10-04 (×2): 1 via TOPICAL

## 2020-10-04 MED ORDER — INSULIN REGULAR(HUMAN) IN NACL 100-0.9 UT/100ML-% IV SOLN
INTRAVENOUS | Status: DC
  Filled 2020-10-04: qty 100

## 2020-10-04 MED ORDER — ACETAMINOPHEN 500 MG PO TABS
1000.0000 mg | ORAL_TABLET | Freq: Four times a day (QID) | ORAL | Status: AC
  Administered 2020-10-05 – 2020-10-09 (×14): 1000 mg via ORAL
  Filled 2020-10-04 (×14): qty 2

## 2020-10-04 MED ORDER — ACETAMINOPHEN 160 MG/5ML PO SOLN: 650.0000 mg | Freq: Once | ORAL | Status: AC

## 2020-10-04 MED ORDER — CEFAZOLIN SODIUM-DEXTROSE 2-4 GM/100ML-% IV SOLN: 2.0000 g | Freq: Three times a day (TID) | INTRAVENOUS | Status: DC

## 2020-10-04 MED ORDER — ASPIRIN 81 MG PO CHEW: 324.0000 mg | CHEWABLE_TABLET | Freq: Every day | ORAL | Status: DC

## 2020-10-04 MED ORDER — DEXMEDETOMIDINE HCL IN NACL 400 MCG/100ML IV SOLN
0.4000 ug/kg/h | INTRAVENOUS | Status: DC
  Administered 2020-10-04 – 2020-10-05 (×2): 1.2 ug/kg/h via INTRAVENOUS
  Filled 2020-10-04 (×3): qty 100

## 2020-10-04 MED ORDER — NITROGLYCERIN IN D5W 200-5 MCG/ML-% IV SOLN: 0.0000 ug/min | INTRAVENOUS | Status: DC

## 2020-10-04 MED ORDER — BISACODYL 10 MG RE SUPP: 10.0000 mg | Freq: Every day | RECTAL | Status: DC

## 2020-10-04 MED ORDER — POTASSIUM CHLORIDE 10 MEQ/50ML IV SOLN
10.0000 meq | INTRAVENOUS | Status: AC
  Administered 2020-10-04 (×3): 10 meq via INTRAVENOUS
  Filled 2020-10-04: qty 50

## 2020-10-04 MED ORDER — THROMBIN (RECOMBINANT) 20000 UNITS EX SOLR
CUTANEOUS | Status: AC
  Filled 2020-10-04: qty 20000

## 2020-10-04 MED ORDER — DOCUSATE SODIUM 100 MG PO CAPS
200.0000 mg | ORAL_CAPSULE | Freq: Every day | ORAL | Status: DC
  Administered 2020-10-06 – 2020-10-10 (×5): 200 mg via ORAL
  Filled 2020-10-04 (×5): qty 2

## 2020-10-04 MED ORDER — PHENYLEPHRINE HCL-NACL 20-0.9 MG/250ML-% IV SOLN: 0.0000 ug/min | INTRAVENOUS | Status: DC

## 2020-10-04 MED ORDER — THROMBIN 20000 UNITS EX SOLR
CUTANEOUS | Status: DC | PRN
  Administered 2020-10-04: 20000 [IU] via TOPICAL

## 2020-10-04 MED ORDER — HYDROCORTISONE NA SUCCINATE PF 250 MG IJ SOLR
INTRAMUSCULAR | Status: AC
  Filled 2020-10-04: qty 250

## 2020-10-04 MED ORDER — TRANEXAMIC ACID 1000 MG/10ML IV SOLN
1.5000 mg/kg/h | INTRAVENOUS | Status: DC
  Filled 2020-10-04: qty 25

## 2020-10-04 MED ORDER — SODIUM CHLORIDE 0.9% IV SOLUTION: Freq: Once | INTRAVENOUS | Status: DC

## 2020-10-04 MED ORDER — MAGNESIUM SULFATE 4 GM/100ML IV SOLN
4.0000 g | Freq: Once | INTRAVENOUS | Status: AC
  Administered 2020-10-04: 4 g via INTRAVENOUS
  Filled 2020-10-04: qty 100

## 2020-10-04 MED ORDER — ARTIFICIAL TEARS OPHTHALMIC OINT
TOPICAL_OINTMENT | OPHTHALMIC | Status: AC
  Filled 2020-10-04: qty 3.5

## 2020-10-04 MED ORDER — ROCURONIUM BROMIDE 10 MG/ML (PF) SYRINGE
PREFILLED_SYRINGE | INTRAVENOUS | Status: AC
  Filled 2020-10-04: qty 10

## 2020-10-04 MED ORDER — METOPROLOL TARTRATE 5 MG/5ML IV SOLN: 2.5000 mg | INTRAVENOUS | Status: DC | PRN

## 2020-10-04 MED ORDER — ACETAMINOPHEN 650 MG RE SUPP
650.0000 mg | Freq: Once | RECTAL | Status: AC
  Administered 2020-10-04: 650 mg via RECTAL

## 2020-10-04 MED ORDER — PANTOPRAZOLE SODIUM 40 MG PO TBEC
40.0000 mg | DELAYED_RELEASE_TABLET | Freq: Every day | ORAL | Status: DC
  Administered 2020-10-06 – 2020-10-10 (×5): 40 mg via ORAL
  Filled 2020-10-04 (×5): qty 1

## 2020-10-04 SURGICAL SUPPLY — 122 items
ADAPTER CARDIO PERF ANTE/RETRO (ADAPTER) ×5 IMPLANT
ADAPTER MULTI PERFUSION 15 (ADAPTER) ×1 IMPLANT
APPLICATOR TIP COSEAL (VASCULAR PRODUCTS) ×2 IMPLANT
BAG DECANTER FOR FLEXI CONT (MISCELLANEOUS) ×5 IMPLANT
BLADE CLIPPER SURG (BLADE) ×5 IMPLANT
BLADE CORE FAN STRYKER (BLADE) ×5 IMPLANT
BLADE STERNUM SYSTEM 6 (BLADE) ×5 IMPLANT
BLADE SURG 11 STRL SS (BLADE) ×1 IMPLANT
BLADE SURG 15 STRL LF DISP TIS (BLADE) ×4 IMPLANT
BLADE SURG 15 STRL SS (BLADE) ×1
BNDG ELASTIC 4X5.8 VLCR STR LF (GAUZE/BANDAGES/DRESSINGS) ×1 IMPLANT
BNDG ELASTIC 6X5.8 VLCR STR LF (GAUZE/BANDAGES/DRESSINGS) ×1 IMPLANT
CANISTER SUCT 3000ML PPV (MISCELLANEOUS) ×5 IMPLANT
CANNULA AORTIC ROOT 9FR (CANNULA) ×1 IMPLANT
CANNULA GUNDRY RCSP 15FR (MISCELLANEOUS) ×5 IMPLANT
CANNULA MC2 2 STG 36/46 NON-V (CANNULA) IMPLANT
CANNULA SUMP PERICARDIAL (CANNULA) ×1 IMPLANT
CANNULA VENOUS 2 STG 34/46 (CANNULA) ×1
CANNULA VESSEL 3MM BLUNT TIP (CANNULA) ×1 IMPLANT
CATH HEART VENT LEFT (CATHETERS) ×4 IMPLANT
CATH ROBINSON RED A/P 18FR (CATHETERS) ×15 IMPLANT
CATH THORACIC 36FR (CATHETERS) ×5 IMPLANT
CATH THORACIC 36FR RT ANG (CATHETERS) ×5 IMPLANT
CNTNR URN SCR LID CUP LEK RST (MISCELLANEOUS) ×4 IMPLANT
CONT SPEC 4OZ STRL OR WHT (MISCELLANEOUS) ×4
CONTAINER PROTECT SURGISLUSH (MISCELLANEOUS) ×7 IMPLANT
COUNTER NEEDLE 20 DBL MAG RED (NEEDLE) ×1 IMPLANT
DEFOGGER ANTIFOG KIT (MISCELLANEOUS) ×1 IMPLANT
DRAPE HALF SHEET 40X57 (DRAPES) ×1 IMPLANT
DRAPE WARM FLUID 44X44 (DRAPES) ×2 IMPLANT
DRSG COVADERM 4X14 (GAUZE/BANDAGES/DRESSINGS) ×5 IMPLANT
DRSG COVADERM 4X8 (GAUZE/BANDAGES/DRESSINGS) ×1 IMPLANT
ELECT CAUTERY BLADE 6.4 (BLADE) ×6 IMPLANT
ELECT REM PT RETURN 9FT ADLT (ELECTROSURGICAL) ×10
ELECTRODE REM PT RTRN 9FT ADLT (ELECTROSURGICAL) ×8 IMPLANT
FELT TEFLON 1X6 (MISCELLANEOUS) ×8 IMPLANT
GAUZE SPONGE 4X4 12PLY STRL (GAUZE/BANDAGES/DRESSINGS) ×5 IMPLANT
GAUZE SPONGE 4X4 12PLY STRL LF (GAUZE/BANDAGES/DRESSINGS) ×2 IMPLANT
GLOVE BIO SURGEON STRL SZ 6 (GLOVE) IMPLANT
GLOVE BIO SURGEON STRL SZ 6.5 (GLOVE) IMPLANT
GLOVE BIO SURGEON STRL SZ7 (GLOVE) IMPLANT
GLOVE BIO SURGEON STRL SZ7.5 (GLOVE) IMPLANT
GLOVE SURG MICRO LTX SZ6 (GLOVE) ×7 IMPLANT
GLOVE SURG MICRO LTX SZ6.5 (GLOVE) ×1 IMPLANT
GLOVE SURG MICRO LTX SZ7 (GLOVE) ×10 IMPLANT
GLOVE SURG MICRO LTX SZ8 (GLOVE) ×6 IMPLANT
GLOVE SURG POLYISO LF SZ6 (GLOVE) ×2 IMPLANT
GLOVE SURG UNDER POLY LF SZ6 (GLOVE) ×1 IMPLANT
GOWN STRL REUS W/ TWL LRG LVL3 (GOWN DISPOSABLE) ×16 IMPLANT
GOWN STRL REUS W/ TWL XL LVL3 (GOWN DISPOSABLE) ×4 IMPLANT
GOWN STRL REUS W/TWL LRG LVL3 (GOWN DISPOSABLE) ×11
GOWN STRL REUS W/TWL XL LVL3 (GOWN DISPOSABLE) ×1
GRAFT CV 30X8WVN NDL (Graft) IMPLANT
GRAFT HEMASHIELD 8MM (Graft) ×1 IMPLANT
GRAFT VALVE AORTIC CRYO 23 (Valve) IMPLANT
GRAFT WOVEN D/V 28DX30L (Vascular Products) ×1 IMPLANT
HEMOSTAT POWDER SURGIFOAM 1G (HEMOSTASIS) ×15 IMPLANT
HEMOSTAT SURGICEL 2X14 (HEMOSTASIS) ×6 IMPLANT
INSERT FOGARTY SM (MISCELLANEOUS) ×2 IMPLANT
INSERT FOGARTY XLG (MISCELLANEOUS) ×1 IMPLANT
KIT BASIN OR (CUSTOM PROCEDURE TRAY) ×5 IMPLANT
KIT CATH CPB BARTLE (MISCELLANEOUS) ×5 IMPLANT
KIT SUCTION CATH 14FR (SUCTIONS) ×5 IMPLANT
KIT TURNOVER KIT B (KITS) ×5 IMPLANT
LINE VENT (MISCELLANEOUS) ×1 IMPLANT
LOOP VESSEL SUPERMAXI WHITE (MISCELLANEOUS) ×1 IMPLANT
NS IRRIG 1000ML POUR BTL (IV SOLUTION) ×25 IMPLANT
PACK E OPEN HEART (SUTURE) ×5 IMPLANT
PACK OPEN HEART (CUSTOM PROCEDURE TRAY) ×5 IMPLANT
PAD ARMBOARD 7.5X6 YLW CONV (MISCELLANEOUS) ×10 IMPLANT
POSITIONER HEAD DONUT 9IN (MISCELLANEOUS) ×5 IMPLANT
PUNCH AORTIC ROTATE 4.5MM 8IN (MISCELLANEOUS) ×1 IMPLANT
SEALANT SURG COSEAL 8ML (VASCULAR PRODUCTS) ×1 IMPLANT
SET MPS 3-ND DEL (MISCELLANEOUS) ×1 IMPLANT
SPONGE LAP 18X18 RF (DISPOSABLE) ×1 IMPLANT
SPONGE LAP 4X18 RFD (DISPOSABLE) ×2 IMPLANT
STRIP PERIGUARD 6X8 (Vascular Products) ×1 IMPLANT
SUT BONE WAX W31G (SUTURE) ×5 IMPLANT
SUT EB EXC GRN/WHT 2-0 V-5 (SUTURE) ×10 IMPLANT
SUT ETHIBON EXCEL 2-0 V-5 (SUTURE) IMPLANT
SUT ETHIBOND 4 0 RB 1 (SUTURE) ×5 IMPLANT
SUT ETHIBOND V-5 VALVE (SUTURE) IMPLANT
SUT MNCRL AB 3-0 PS2 18 (SUTURE) ×1 IMPLANT
SUT PROLENE 3 0 SH 1 (SUTURE) ×5 IMPLANT
SUT PROLENE 3 0 SH 48 (SUTURE) ×12 IMPLANT
SUT PROLENE 3 0 SH DA (SUTURE) ×1 IMPLANT
SUT PROLENE 3 0 SH1 36 (SUTURE) ×2 IMPLANT
SUT PROLENE 4 0 RB 1 (SUTURE) ×11
SUT PROLENE 4 0 SH DA (SUTURE) ×1 IMPLANT
SUT PROLENE 4-0 RB1 .5 CRCL 36 (SUTURE) ×16 IMPLANT
SUT PROLENE 5 0 C 1 36 (SUTURE) ×2 IMPLANT
SUT PROLENE 7 0 BV1 MDA (SUTURE) ×1 IMPLANT
SUT SILK 2 0 SH CR/8 (SUTURE) ×1 IMPLANT
SUT STEEL 6MS V (SUTURE) IMPLANT
SUT STEEL STERNAL CCS#1 18IN (SUTURE) ×1 IMPLANT
SUT STEEL SZ 6 DBL 3X14 BALL (SUTURE) IMPLANT
SUT VIC AB 1 CTX 36 (SUTURE) ×2
SUT VIC AB 1 CTX36XBRD ANBCTR (SUTURE) ×8 IMPLANT
SUT VIC AB 2-0 CT1 27 (SUTURE) ×2
SUT VIC AB 2-0 CT1 TAPERPNT 27 (SUTURE) IMPLANT
SUT VIC AB 2-0 CTX 36 (SUTURE) ×1 IMPLANT
SUT VIC AB 3-0 SH 27 (SUTURE) ×1
SUT VIC AB 3-0 SH 27X BRD (SUTURE) IMPLANT
SUT VIC AB 3-0 X1 27 (SUTURE) ×1 IMPLANT
SWAB COLLECTION DEVICE MRSA (MISCELLANEOUS) ×1 IMPLANT
SWAB CULTURE ESWAB REG 1ML (MISCELLANEOUS) ×1 IMPLANT
SYSTEM SAHARA CHEST DRAIN ATS (WOUND CARE) ×5 IMPLANT
TAPE CLOTH SURG 4X10 WHT LF (GAUZE/BANDAGES/DRESSINGS) ×2 IMPLANT
TAPE PAPER 2X10 WHT MICROPORE (GAUZE/BANDAGES/DRESSINGS) ×1 IMPLANT
TIP APPLICATOR SPRAY EXTEND 16 (VASCULAR PRODUCTS) ×1 IMPLANT
TOWEL GREEN STERILE (TOWEL DISPOSABLE) ×5 IMPLANT
TOWEL GREEN STERILE FF (TOWEL DISPOSABLE) ×7 IMPLANT
TRAY FOLEY SLVR 14FR TEMP STAT (SET/KITS/TRAYS/PACK) ×5 IMPLANT
TRAY FOLEY SLVR 16FR TEMP STAT (SET/KITS/TRAYS/PACK) ×5 IMPLANT
TUBE CONNECTING 20X1/4 (TUBING) ×1 IMPLANT
TUBE SUCT INTRACARD DLP 20F (MISCELLANEOUS) ×1 IMPLANT
TUBING LAP HI FLOW INSUFFLATIO (TUBING) ×1 IMPLANT
UNDERPAD 30X36 HEAVY ABSORB (UNDERPADS AND DIAPERS) ×5 IMPLANT
VALVE AORTIC CRYO 23MM (Valve) ×5 IMPLANT
VENT LEFT HEART 12002 (CATHETERS) ×10
WATER STERILE IRR 1000ML POUR (IV SOLUTION) ×10 IMPLANT
YANKAUER SUCT BULB TIP NO VENT (SUCTIONS) ×2 IMPLANT

## 2020-10-04 NOTE — Transfer of Care (Signed)
Immediate Anesthesia Transfer of Care Note  Patient: Ronnie Ruiz  Procedure(s) Performed: REDO STERNOTOMY REDO BENTALL PROCEDURE ON PUMP WITH A HEMASHIELD PLATINUM GRAFT AND HOMOGRAFT (Chest) TRANSESOPHAGEAL ECHOCARDIOGRAM (TEE) APPLICATION OF CELL SAVER ENDOVEIN HARVEST OF GREATER SAPHENOUS VEIN (Right)  Patient Location: ICU  Anesthesia Type:General  Level of Consciousness: Patient remains intubated per anesthesia plan  Airway & Oxygen Therapy: Patient remains intubated per anesthesia plan and Patient placed on Ventilator (see vital sign flow sheet for setting)  Post-op Assessment: Report given to RN and Post -op Vital signs reviewed and unstable, Anesthesiologist notified  Post vital signs: Reviewed and stable  Last Vitals:  Vitals Value Taken Time  BP 119/80 10/04/20 2109  Temp 34.9 C 10/04/20 2113  Pulse 87 10/04/20 2109  Resp 14 10/04/20 2113  SpO2 100 % 10/04/20 2109  Vitals shown include unvalidated device data.  Last Pain:  Vitals:   10/04/20 0441  TempSrc: Oral  PainSc:       Patients Stated Pain Goal: 0 (10/03/20 0845)  Complications: No notable events documented.

## 2020-10-04 NOTE — Progress Notes (Signed)
PROGRESS NOTE    Ronnie Ruiz  BJY:[id number] DOB: September 29, 1965 DOA: 09/26/2020 PCP: [name], MD    No chief complaint on file.   Brief Narrative:  Ronnie Ruiz is a 55 year old male with past medical history significant for essential hypertension, hyperlipidemia, type 2 diabetes mellitus, history of by cuspid aortic valve s/p bioprosthetic AVR 2020 who was found to have HAECK organism bacteremia at [facility] and was transferred to [facility] on 6/7 for TEE and ID consultation.  Patient was transferred from the cardiology service to [facility] on 09/28/2020.  Assessment & Plan:   Principal Problem:   Endocarditis Active Problems:   Hyperlipidemia   Essential hypertension   Alcohol dependency (HCC)   Heart murmur   Bicuspid aortic valve   S/P AVR   Fatigue   Bacteremia   Protein-calorie malnutrition, severe   Dental caries   Aortic valve abscess  Aggregatobacter septicemia present on admission.  Ceftriaxone 2 g IV every 24 hours, ID plan 6 weeks antibiotics following valve replacement --CBC daily, monitor fever curve.   Severe AS WITH bicuspid aortic valve s/p AVR, now with perivalvular abscess:  Plan for redo surgery of the AV replacement.   Severe protien calorie malnutrition   GERD Continue with PPI   Type 2 DM:  - Poorly controlled.  - controlled SSI.    Essential Hypertension:  Well controlled.      DVT prophylaxis: heparin.  Code Status: (Full Code) Family Communication: none at bedside.  Disposition:   Status is: Inpatient  Remains inpatient appropriate because:Ongoing diagnostic testing needed not appropriate for outpatient work up and IV treatments appropriate due to intensity of illness or inability to take PO  Dispo: The patient is from: Home              Anticipated d/c is to: Home              Patient currently is not medically stable to d/c.   Difficult to place patient No       Consultants:   Cardiology Cardiothoracic surgery.   Procedures: none.   Antimicrobials: Antibiotics Given (last 72 hours)     Date/Time Action Medication Dose Rate   10/02/20 0955 New Bag/Given   [MAR Hold] cefTRIAXone (ROCEPHIN) 2 g in sodium chloride 0.9 % 100 mL IVPB (MAR Hold since Wed 10/04/2020 at 0704.Hold Reason: Transfer to a Procedural area) 2 g 200 mL/hr   10/03/20 0852 New Bag/Given   [MAR Hold] cefTRIAXone (ROCEPHIN) 2 g in sodium chloride 0.9 % 100 mL IVPB (MAR Hold since Wed 10/04/2020 at 0704.Hold Reason: Transfer to a Procedural area) 2 g 200 mL/hr   10/04/20 0853 Given   vancomycin (VANCOREADY) IVPB 1500 mg/300 mL 1,500 mg    10/04/20 0915 Given   ceFAZolin (ANCEF) IVPB 2g/100 mL premix 2 g    10/04/20 1831 Given   ceFAZolin (ANCEF) IVPB 2g/100 mL premix 2 g            Objective: Vitals:   10/04/20 0804 10/04/20 0805 10/04/20 0806 10/04/20 0807  BP:      Pulse: 72 71 73 74  Resp: Temp:      TempSrc:      SpO2: 99% 99% 99% 99%  Weight:      Height:        Intake/Output Summary (Last 24 hours) at 10/04/2020 1909 Last data filed at 10/04/2020 1909 Gross per 24 hour  Intake 4411.07 ml  Output 2265 ml  Net 2146.07 ml   Filed Weights   09/26/20 2121 09/27/20 0335 09/28/20 0333  Weight: 86 kg 85 kg 84.6 kg        Data Reviewed: I have personally reviewed following labs and imaging studies  CBC: Recent Labs  Lab 09/29/20 0417 09/30/20 0220 10/01/20 0137 10/02/20 0421 10/03/20 0158 10/04/20 0239 10/04/20 0921 10/04/20 1733 10/04/20 1737 10/04/20 1818 10/04/20 1853 10/04/20 1859  WBC 10.2 8.2 8.1 8.2 9.0 9.3  --   --   --   --   --   --   NEUTROABS 7.0 5.6 5.5 5.7 5.4  --   --   --   --   --   --   --   HGB 10.5* 9.6* 9.9* 10.8* 10.4* 10.5*   < > 8.2* 7.0* 7.5* 8.5* 9.5*  HCT 32.6* 29.9* 30.0* 32.4* 32.7* 32.2*   < > 24.0* 21.4* 22.0* 25.0* 28.0*  MCV 92.6 92.0 91.2 85.5 91.3 91.5  --   --   --   --   --   --   PLT 385 398 357 243 404*  391  --   --  153  --   --   --    < > = values in this interval not displayed.    Basic Metabolic Panel: Recent Labs  Lab 09/30/20 0220 10/01/20 0137 10/02/20 0421 10/02/20 0748 10/03/20 0158 10/04/20 0239 10/04/20 0921 10/04/20 1557 10/04/20 1647 10/04/20 1711 10/04/20 1733 10/04/20 1818 10/04/20 1853 10/04/20 1859  NA 132* 132* 135  --  132* 135   < > 138 139 139 139 137 140 139  K 4.1 4.1 3.5  --  4.1 4.2   < > 3.3* 3.6 4.4 3.9 4.6 3.9 3.9  CL 101 97* 101  --  98 100   < > 102 101  --  101 102  --  102  CO2 23 26 25   --  27 27  --   --   --   --   --   --   --   --   GLUCOSE 238* 208* 130*  --  166* 191*   < > 148* 146*  --  152* 153*  --  164*  BUN 8 7 42*  --  11 11   < > 9 9  --  9 8  --  9  CREATININE 0.63 0.71 3.19* 0.71 0.70 0.78   < > 0.40* 0.30*  --  0.30* 0.30*  --  0.30*  CALCIUM 8.7* 9.0 8.8*  --  9.1 9.2  --   --   --   --   --   --   --   --   MG  --   --   --  1.9  --   --   --   --   --   --   --   --   --   --    < > = values in this interval not displayed.    GFR: Estimated Creatinine Clearance: 109.2 mL/min (A) (by C-G formula based on SCr of 0.3 mg/dL (L)).  Liver Function Tests: No results for input(s): AST, ALT, ALKPHOS, BILITOT, PROT, ALBUMIN in the last 168 hours.  CBG: Recent Labs  Lab 10/02/20 2023 10/03/20 0808 10/03/20 1155 10/03/20 1648 10/03/20 2157  GLUCAP 206* 175* 230* 154* 183*     Recent Results (from the past 240 hour(s))  Culture, blood (  routine x 2)     Status: None   Collection Time: 09/26/20 11:48 PM   Specimen: BLOOD  Result Value Ref Range Status   Specimen Description BLOOD SITE NOT SPECIFIED  Final   Special Requests   Final    BOTTLES DRAWN AEROBIC AND ANAEROBIC Blood Culture results may not be optimal due to an excessive volume of blood received in culture bottles   Culture   Final    NO GROWTH 5 DAYS Performed at [facility], [address], [place], [place] [address]    Report Status 10/02/2020  FINAL  Final  Culture, blood (routine x 2)     Status: None   Collection Time: 09/26/20 11:48 PM   Specimen: BLOOD  Result Value Ref Range Status   Specimen Description BLOOD SITE NOT SPECIFIED  Final   Special Requests   Final    BOTTLES DRAWN AEROBIC AND ANAEROBIC Blood Culture adequate volume   Culture   Final    NO GROWTH 5 DAYS Performed at [facility], [address] [place], [place] [address]    Report Status 10/02/2020 FINAL  Final  SARS CORONAVIRUS 2 (TAT 6-24 HRS) Nasopharyngeal Nasopharyngeal Swab     Status: None   Collection Time: 09/27/20  3:20 AM   Specimen: Nasopharyngeal Swab  Result Value Ref Range Status   SARS Coronavirus 2 NEGATIVE NEGATIVE Final    Comment: (NOTE) SARS-CoV-2 target nucleic acids are NOT DETECTED.  The SARS-CoV-2 RNA is generally detectable in upper and lower respiratory specimens during the acute phase of infection. Negative results do not preclude SARS-CoV-2 infection, do not rule out co-infections with other pathogens, and should not be used as the sole basis for treatment or other patient management decisions. Negative results must be combined with clinical observations, patient history, and epidemiological information. The expected result is Negative.  Fact Sheet for Patients: HairSlick.no  Fact Sheet for Healthcare Providers: quierodirigir.com  This test is not yet approved or cleared by the [place] FDA and  has been authorized for detection and/or diagnosis of SARS-CoV-2 by FDA under an Emergency Use Authorization (EUA). This EUA will remain  in effect (meaning this test can be used) for the duration of the COVID-19 declaration under Se ction 564(b)(1) of the Act, 21 U.S.C. section 360bbb-3(b)(1), unless the authorization is terminated or revoked sooner.  Performed at [facility], [address] [place], [place] [address]   Surgical pcr screen      Status: None   Collection Time: 10/03/20  8:56 AM   Specimen: Nasal Mucosa; Nasal Swab  Result Value Ref Range Status   MRSA, PCR NEGATIVE NEGATIVE Final   Staphylococcus aureus NEGATIVE NEGATIVE Final    Comment: (NOTE) The Xpert SA Assay (FDA approved for NASAL specimens in patients 56 years of age and older), is one component of a comprehensive surveillance program. It is not intended to diagnose infection nor to guide or monitor treatment. Performed at [facility], [address] [place], [place] [address]          Radiology Studies: VAS US DOPPLER PRE CABG  Result Date: 10/04/2020 PREOPERATIVE VASCULAR EVALUATION Patient Name:  MARRION ACCOMANDO  Date of Exam:   10/03/2020 Medical Rec #: [id number]       Accession #:    [id number] Date of Birth: 06-Nov-1965       Patient Gender: M Patient Age:   55Y Exam Location:  [facility] Procedure:  VAS US DOPPLER PRE CABG Referring Phys: [id number] BRYAN K BARTLE --------------------------------------------------------------------------------  Indications:      Pre-CABG. Risk Factors:     Hypertension, hyperlipidemia, Diabetes. Comparison Study: prior 09/07/18 Performing Technologist: [place] Ponder RVS  Examination Guidelines: A complete evaluation includes B-mode imaging, spectral Doppler, color Doppler, and power Doppler as needed of all accessible portions of each vessel. Bilateral testing is considered an integral part of a complete examination. Limited examinations for reoccurring indications may be performed as noted.  Right Carotid Findings: +----------+--------+--------+--------+------------+--------+           PSV cm/sEDV cm/sStenosisDescribe    Comments +----------+--------+--------+--------+------------+--------+ CCA Prox  62      11              heterogenous         +----------+--------+--------+--------+------------+--------+ CCA Distal76      19              heterogenous          +----------+--------+--------+--------+------------+--------+ ICA Prox  90      29      1-39%   heterogenous         +----------+--------+--------+--------+------------+--------+ ICA Distal85      23                                   +----------+--------+--------+--------+------------+--------+ ECA       75                                           +----------+--------+--------+--------+------------+--------+ Portions of this table do not appear on this page. +----------+--------+-------+--------+------------+           PSV cm/sEDV cmsDescribeArm Pressure +----------+--------+-------+--------+------------+ Subclavian81                                  +----------+--------+-------+--------+------------+ +---------+--------+--+--------+-+---------+ VertebralPSV cm/s29EDV cm/s8Antegrade +---------+--------+--+--------+-+---------+ Left Carotid Findings: +----------+--------+--------+--------+------------+--------+           PSV cm/sEDV cm/sStenosisDescribe    Comments +----------+--------+--------+--------+------------+--------+ CCA Prox  75      11              heterogenous         +----------+--------+--------+--------+------------+--------+ CCA Distal70      19              heterogenous         +----------+--------+--------+--------+------------+--------+ ICA Prox  64      22      1-39%   heterogenous         +----------+--------+--------+--------+------------+--------+ ICA Distal81      23                                   +----------+--------+--------+--------+------------+--------+ ECA       78                                           +----------+--------+--------+--------+------------+--------+ +----------+--------+--------+--------+------------+ SubclavianPSV cm/sEDV cm/sDescribeArm Pressure +----------+--------+--------+--------+------------+           102                                   +----------+--------+--------+--------+------------+ +---------+--------+--+--------+-+---------+  VertebralPSV cm/s30EDV cm/s9Antegrade +---------+--------+--+--------+-+---------+  ABI Findings: +--------+------------------+-----+---------+--------+ Right   Rt Pressure (mmHg)IndexWaveform Comment  +--------+------------------+-----+---------+--------+ Brachial                       triphasic         +--------+------------------+-----+---------+--------+ +--------+------------------+-----+---------+-------+ Left    Lt Pressure (mmHg)IndexWaveform Comment +--------+------------------+-----+---------+-------+ Brachial                       triphasic        +--------+------------------+-----+---------+-------+  Right Doppler Findings: +--------+--------+-----+---------+--------+ Site    PressureIndexDoppler  Comments +--------+--------+-----+---------+--------+ Brachial             triphasic         +--------+--------+-----+---------+--------+ Radial               triphasic         +--------+--------+-----+---------+--------+ Ulnar                triphasic         +--------+--------+-----+---------+--------+  Left Doppler Findings: +--------+--------+-----+---------+--------+ Site    PressureIndexDoppler  Comments +--------+--------+-----+---------+--------+ Brachial             triphasic         +--------+--------+-----+---------+--------+ Radial               triphasic         +--------+--------+-----+---------+--------+ Ulnar                triphasic         +--------+--------+-----+---------+--------+  Summary: Right Carotid: Velocities in the right ICA are consistent with a 1-39% stenosis. Left Carotid: Velocities in the left ICA are consistent with a 1-39% stenosis. Vertebrals: Bilateral vertebral arteries demonstrate antegrade flow. Right Upper Extremity: Doppler waveform obliterate with right radial compression. Doppler waveforms  remain within normal limits with right ulnar compression. Left Upper Extremity: Doppler waveforms remain within normal limits with left radial compression. Doppler waveform obliterate with left ulnar compression.  Electronically signed by Fabienne Bruns MD on 10/04/2020 at 8:46:04 AM.    Final         Scheduled Meds:  sodium chloride   Intravenous Once   [MAR Hold] aspirin EC  81 mg Oral Daily   [MAR Hold] atorvastatin  80 mg Oral Daily   diazepam  5 mg Oral Once   [MAR Hold] docusate sodium  100 mg Oral BID   [MAR Hold] heparin  5,000 Units Subcutaneous Q8H   [MAR Hold] insulin aspart  0-15 Units Subcutaneous TID WC   [MAR Hold] insulin aspart  0-5 Units Subcutaneous QHS   [MAR Hold] insulin aspart  4 Units Subcutaneous TID WC   [MAR Hold] insulin glargine  20 Units Subcutaneous Daily   [facility] Blood Cardioplegia vial (lidocaine/magnesium/mannitol 0.26g-4g-6.4g)   Intracoronary Once   magnesium sulfate  40 mEq Other To OR   [MAR Hold] metoprolol tartrate  25 mg Oral BID   [MAR Hold] multivitamin with minerals  1 tablet Oral Daily   [MAR Hold] pantoprazole  40 mg Oral Daily   potassium chloride  80 mEq Other To OR   [MAR Hold] Ensure Max Protein  11 oz Oral BID   tranexamic acid  2 mg/kg Intracatheter To OR   Continuous Infusions:   ceFAZolin (ANCEF) IV     [MAR Hold] cefTRIAXone (ROCEPHIN)  IV Stopped (10/03/20 0922)   heparin 30,000 units/NS 1000 mL solution for CELLSAVER     milrinone     norepinephrine  tranexamic acid (CYKLOKAPRON) infusion ([facility])       LOS: 8 days       Kathlen Mody, MD [facility]   To contact the attending provider between 7A-7P or the covering provider during after hours 7P-7A, please log into the web site [email or web address] and access using universal [place] password for that web site. If you do not have the password, please call the hospital operator.  10/04/2020, 7:09 PM

## 2020-10-04 NOTE — Interval H&P Note (Signed)
History and Physical Interval Note:  10/04/2020 7:00 AM  Ronnie Ruiz  has presented today for surgery, with the diagnosis of PERIVALVULAR ABSCESS.  The various methods of treatment have been discussed with the patient and family. After consideration of risks, benefits and other options for treatment, the patient has consented to  Procedure(s) with comments: REDO STERNOTOMY (N/A) REDO BENTALL PROCEDURE WITH HOMOGRAFT (N/A) - AXILLARY CANNULATION TRANSESOPHAGEAL ECHOCARDIOGRAM (TEE) (N/A) as a surgical intervention.  The patient's history has been reviewed, patient examined, no change in status, stable for surgery.  I have reviewed the patient's chart and labs.  Questions were answered to the patient's satisfaction.     [name]

## 2020-10-04 NOTE — Op Note (Signed)
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 Postoperative Diagnosis:  Same   Procedure:  Redo Median Sternotomy Extracorporeal circulation 3.   Replacement of the ascending aortic graft(hemi-arch) using a 28 mm Hemashield Platinum graft under deep hypothermic circulatory arrest 4.   Redo Bentall Procedure using a 23 mm Aortic Homograft valve/root.  Anesthesia:  General Endotracheal   Clinical History/Surgical Indication:   He is a 55 year old gentleman who underwent biological Bentall procedure with a 25 mm Edwards magna-ease pericardial valve inside a 28 mm Gelweave Valsalva graft and replacement of the ascending aorta (Hemi arch) using a 28 mm Hemashield graft on 09/22/2018 by me.  I used a bioprosthetic valve due to his significant arthritis and nonsteroidal anti-inflammatory use as well as alcohol abuse.  That was a somewhat complicated procedure due to extensive calcification of his aortic annulus and a weak aortic annulus after calcium was removed.  He recovered well and has been fine until he had a cortisol injection in his left shoulder for chronic shoulder pain and said that after that his blood sugar got out of control and he began feeling poorly with night sweats, fevers, chills, and generalized weakness.  He presented to [facility] with uncontrolled diabetes, dehydration, and hypertension as well as acute kidney injury.  Blood cultures grew Aggregatibacter which is typically a dental pathogen. He was treated by his dentist prior to his valve surgery and had several bad teeth extracted but still had several remaining teeth that have been in poor condition but not causing him any symptoms. He has been seen by oral surgery here (Dr. Barbette Merino) and not felt to have any acute problems and orthopantogram negative. Dr.  Barbette Merino felt that his remaining teeth could be extracted later. Follow up [facility] here have been negative. His TEE showed a periannular abscess posteriorly and around the left main coronary artery. Gated cardiac CTA confirms this. The collection was not present on his postop echo or on followup CTA of the chest last June. The valve is functioning fine with no AS/AI and no vegetation but given history I think we have to assume this is a real abscess. Unfortunately replacement of his aortic valve and graft is an extremely complicated and risky operation with high risk of coronary complications, bleeding, myocardial failure and death. Unfortunately the mortality rate without surgery for prosthetic valve endocarditis is also high. He understands and agrees to proceed with surgery.   Preparation:  The patient was seen in the preoperative holding area and the correct patient, correct operation were confirmed with the patient after reviewing the medical record and catheterization. The consent was signed by me. Preoperative antibiotics were given. A pulmonary arterial line and bilateral radial arterial lines were placed by the anesthesia team. The patient was taken back to the operating room and positioned supine on the operating room table. After being placed under general endotracheal anesthesia by the anesthesia team a foley catheter was placed. The neck, chest, abdomen, and both legs were prepped with betadine soap and solution and draped in the usual sterile manner. A surgical time-out was taken and the correct patient and operative procedure were confirmed with the nursing and anesthesia staff.  TEE:  Performed by Dr. Jairo Ben . This showed a large peri-annular/peri-graft abscess posterior to the aortic root around the left main coronary artery. No vegetations were seen  on the aortic, mitral or tricuspid valves. No AI, trivial MR. Normal LV systolic function.   Redo Sternotomy:  A redo median sternotomy  was performed. The sternal wires were removed and the sternum opened using the oscillating saw without incident.  Dissection was performed to expose the right atrium.  I spent some time trying to expose the ascending aortic graft which was firmly adherent to the surrounding tissues.  I was able to separate the graft from the superior vena cava but could not separate it from the pulmonary artery and I was concerned that I may end up entering the pulmonary artery if I continued to persist.  This graft has some murky appearing fluid around it and I thought that it should be replaced given the concern about infection.    Right axillary artery exposure and cannulation:  A transverse incision was made below the right clavicle. The pectoralis major muscle was split along its fibers and the pectoralis minor muscle was retracted laterally. The brachial plexus was identified and gently retracted laterally to expose the axillary artery. The artery was controlled proximally and distally with vessel loops. The patient was fully heparinized and ACT maintained greater than 400. The axillary artery was clamped proximally and distally with peripheral Debakey clamps. It was opened longitudinally. There was no sign of dissection here. An 8 mm Hemashield dacron graft was anastomosed in an end to side manner using continuous 5-0 prolene suture. CoSeal was applied for hemostasis and the clamp removed. The graft was connected to the arterial end of the bypass circuit.     Venous cannulation was performed via the right atrium using a two-staged venous cannula.  CO2 was insufflated into the pericardium throughout the case to minimize intracardiac air.    Replacement of ascending aortic graft:  The patient was placed on cardiopulmonary bypass and a left ventricular vent was placed via the right superior pulmonary vein.  An antegrade cardioplegia and vent cannula was inserted into the ascending aortic graft.  Systemic cooling was  begun with a goal temperature of 18 degrees centigrade by bladder and rectal temperature probes.  The innominate artery was located and encircled with a vessel loop proximally.  The aortic graft was cross-clamped distally where I was able to dissect it free and 1300 cc of cold KBC cardioplegia was administered through the ascending aortic cannula.  After the first 80 minutes 400 cc of cold KBC cardioplegia was administered directly into the right and left coronary ostia every 60 minutes per protocol.  After 20 minutes of cooling the target temperature of 18 degrees centigrade was reached. Cerebral oximetry was 70% bilaterally. BIS was zero. The patient was given Propofol and 125 mg of Solumedrol. The head was packed in ice. The bed was placed in steep trendelenburg. Circulatory arrest was begun and the blood volume emptied into the venous reservoir.  The proximal innominate artery was clamped and antegrade cerebral perfusion was begun through the right subclavian artery.  There was brisk return of blood into the aortic arch by the left common carotid artery.  The aorta was transected just distal to the previous anastomosis beveling the resection out along the undersurface of the aortic arch (Hemiarch replacement).  With the aortic graft transected I was able to dissected free from the main pulmonary artery as well as the right pulmonary artery.  The aortic diameter at the resection site was measured at 28 mm.  A 28  mm Hemasheild Platinum vascular graft was prepared. Librarian, academic( Catalog #  [id number] P0, Lot #[id number]. SN [id number]). It was anastomosed to the aortic arch in an end to end manner using 3-0 prolene continuous suture with a felt strip to reinforce the anastomisis. A light coating of CoSeal was applied to seal needle holes.  The clamp was removed from the innominate artery and full circulation was slowly resumed. The aortic graft was cross-clamped proximal to the anastomosis and full CPB support was resumed.  Circulatory arrest time was 31 minutes. Antegrade cerebral perfusion time was 27 minutes.   Bentall Procedure:   The ascending aortic and root grafts were mobilized from the right pulmonary artery and main PA. There was dense inflammation around them and some purulent appearing fluid which was cultured.  Dissection of these graft was continued down to the level of the coronary anastomoses.  The right and left coronary arteries were removed from the aortic root with a button of aortic wall around the ostia.  They were retracted carefully out of the way with stay sutures to prevent rotation.  Dissection of the aortic root graft was then continued down to the annulus.  I encountered a large collection of grossly purulent creamy white pus posteriorly in the area noted on CT and TEE.  This was located outside the graft near the annulus and around the left main coronary artery.  An extended posteriorly between the right pulmonary artery and the aorto mitral curtain.  This fluid was cultured.  The old sutures anchoring the valve and graft to the annulus were removed.  The aortic valve and root graft were removed without difficulty.  Examination of the prosthetic aortic valve showed that there were several small vegetations on the ventricular surface of leaflets with some fibrinopurulent material present on the surface.  The aortic valve leaflets were cultured.  The residual aortic annulus was sized and a 23 mm cryopreserved aortic valve homograft ( ID [id number]) was prepared.  A series of 4-0 Ethibond simple sutures were placed around the annulus.   Small openings were made in the graft for the coronary anastomoses using a thermal cautery. Then the left and right coronary buttons were anastomosed to the homograft in an end to side manner using continuous 5-0 prolene suture.  The left coronary was anastomosed to the homograft left coronary ostium.  The patient's right coronary artery was line more towards the  noncoronary sinus and therefore a second opening was made in the noncoronary sinus of the homograft for this anastomosis.  The homograft right coronary stump was suture-ligated with a 4-0 Prolene suture.  A light coating of CoSeal was applied to each anastomosis for hemostasis. The t homograft and ascending aortic graft were then cut to the appropriate length and anastomosed end to end using continuous 4-0 prolene suture. CoSeal was applied to seal the needle holes in the grafts. A vent cannula was placed into the graft to remove any air. Deairing maneuvers were performed and the bed placed in trendelenburg position.   Completion:   The patient was rewarmed to 37 degrees Centigrade.  The patient was given a reanimation dose of KBC cardioplegia and the crossclamp was removed with a time of 281 minutes. There was spontaneous return of sinus rhythm. The position of the grafts was satisfactory. The vascular anastomoses all appeared hemostatic. Two temporary epicardial pacing wires were placed on the right atrium and two on the right ventricle. The patient was weaned from CPB without difficulty on no inotropic agents.  CPB time was . Cardiac output was 5 LPM. TEE  showed a normal functioning aortic valve with no AI. There was unchanged trivial MR. LV function appeared normal. Heparin was fully reversed with protamine and the venous cannula removed.  The right axillary artery graft was ligated with a heavy silk tie followed by a 3-0 pledgeted Prolene horizontal mattress suture.  Hemostasis was achieved.  The patient was given 2 units of platelets, 2 units of fresh frozen plasma, and 2 units of cryoprecipitate for coagulopathy after this long surgery.  Mediastinal and right pleural drainage tubes were placed. The sternum was closed with double #6 stainless steel wires. The fascia was closed with continuous # 1 vicryl suture. The subcutaneous tissue was closed with 2-0 vicryl continuous suture. The skin was  closed with 3-0 vicryl subcuticular suture. All sponge, needle, and instrument counts were reported correct at the end of the case. Dry sterile dressings were placed over the incisions and around the chest tubes which were connected to pleurevac suction. The patient was then transported to the surgical intensive care unit in stable condition.

## 2020-10-04 NOTE — Anesthesia Procedure Notes (Signed)
Procedure Name: Intubation Date/Time: 10/04/2020 9:08 AM Performed by: Sherlie Ban, RN Pre-anesthesia Checklist: Patient identified, Emergency Drugs available, Suction available and Patient being monitored Patient Re-evaluated:Patient Re-evaluated prior to induction Oxygen Delivery Method: Circle System Utilized Preoxygenation: Pre-oxygenation with 100% oxygen Induction Type: IV induction Ventilation: Mask ventilation without difficulty Laryngoscope Size: Mac and 4 Grade View: Grade II Tube type: Oral Tube size: 8.0 mm Number of attempts: 1 Airway Equipment and Method: Stylet Placement Confirmation: ETT inserted through vocal cords under direct vision, positive ETCO2 and breath sounds checked- equal and bilateral Secured at: 22 cm Tube secured with: Tape Dental Injury: Teeth and Oropharynx as per pre-operative assessment

## 2020-10-04 NOTE — Anesthesia Procedure Notes (Addendum)
Arterial Line Insertion Start/End6/15/2022 8:00 AM, 10/04/2020 8:15 AM Performed by: CRNA  Patient location: Pre-op. Preanesthetic checklist: patient identified, IV checked, site marked, risks and benefits discussed, surgical consent, monitors and equipment checked, pre-op evaluation and anesthesia consent Lidocaine 1% used for infiltration and patient sedated Left, radial was placed Catheter size: 20 Fr Hand hygiene performed  and maximum sterile barriers used   Attempts: 2 (One attempt by [facility]) Procedure performed without using ultrasound guided technique. Following insertion, dressing applied and Biopatch. Post procedure assessment: normal and unchanged  Patient tolerated the procedure well with no immediate complications.

## 2020-10-04 NOTE — Anesthesia Procedure Notes (Addendum)
Arterial Line Insertion Start/End6/15/2022 7:57 AM, 10/04/2020 8:00 AM Performed by: CRNA  Patient location: Pre-op. Preanesthetic checklist: patient identified, IV checked, site marked, risks and benefits discussed, surgical consent, monitors and equipment checked, pre-op evaluation, timeout performed and anesthesia consent Lidocaine 1% used for infiltration and patient sedated Right, radial was placed Catheter size: 20 Fr Hand hygiene performed  and maximum sterile barriers used   Attempts: 1 Procedure performed without using ultrasound guided technique. Following insertion, dressing applied and Biopatch. Post procedure assessment: normal and unchanged  Patient tolerated the procedure well with no immediate complications.

## 2020-10-04 NOTE — Progress Notes (Signed)
  Echocardiogram Echocardiogram Transesophageal has been performed.  Ronnie Ruiz 10/04/2020, 2:44 PM 

## 2020-10-04 NOTE — Anesthesia Procedure Notes (Signed)
Central Venous Catheter Insertion Performed by: Annye Asa, MD, anesthesiologist Start/End6/15/2022 7:36 AM, 10/04/2020 7:56 AM Preanesthetic checklist: patient identified, IV checked, risks and benefits discussed, surgical consent, monitors and equipment checked, pre-op evaluation, timeout performed and anesthesia consent Position: supine Lidocaine 1% used for infiltration and patient sedated Hand hygiene performed , maximum sterile barriers used  and Seldinger technique used Catheter size: 9 Fr PA cath was placed.MAC introducer Swan type:thermodilution Procedure performed using ultrasound guided technique. Ultrasound Notes:anatomy identified, needle tip was noted to be adjacent to the nerve/plexus identified, no ultrasound evidence of intravascular and/or intraneural injection and image(s) printed for medical record Attempts: 1 Following insertion, line sutured and Biopatch. Post procedure assessment: blood return through all ports, free fluid flow and no air  Post procedure complications: arrhythmia. Patient tolerated the procedure well with no immediate complications. Additional procedure comments: PA catheter:  Routine monitors. Timeout, sterile prep, drape, FBP R neck.  Supine position.  1% Lido local, finder and trocar RIJ 1st pass with US guidance.  MAC introducer placed over J wire. PA catheter difficult to float, transient ectopy, pt asymptomatic.  Sterile dressing applied.  Patient tolerated well, VSS.  Jenita Seashore, MD .

## 2020-10-04 NOTE — Brief Op Note (Signed)
09/26/2020 - 10/04/2020  8:50 AM  PATIENT:  Ronnie Ruiz  55 y.o. male  PRE-OPERATIVE DIAGNOSIS:  PERIVALVULAR ABSCESS  POST-OPERATIVE DIAGNOSIS:  PERIVAVULAR ABSCESS  PROCEDURE:  Procedure(s) with comments: REDO STERNOTOMY (N/A) REDO BENTALL PROCEDURE ON PUMP WITH A HEMASHIELD PLATINUM GRAFT AND HOMOGRAFT (N/A) - AXILLARY CANNULATION WITH GRAFT TRANSESOPHAGEAL ECHOCARDIOGRAM (TEE) (N/A) APPLICATION OF CELL SAVER ENDOVEIN HARVEST OF GREATER SAPHENOUS VEIN (Right)  SV harvest: 30 minute harvest, 10 minute prep  SURGEON:  Surgeon(s) and Role:    * Bartle, Payton Doughty, MD - Primary  PHYSICIAN ASSISTANT:   Jari Favre, PA-C  ANESTHESIA:   general  EBL:  see Anesthesia note  BLOOD ADMINISTERED:none  DRAINS:  ROUTINE    LOCAL MEDICATIONS USED:  NONE  SPECIMEN:  Source of Specimen:  AORTIC GRAFT & VALVE  DISPOSITION OF SPECIMEN:  PATHOLOGY  COUNTS:  YES  DICTATION: .Dragon Dictation  PLAN OF CARE: Admit to inpatient   PATIENT DISPOSITION:  ICU - intubated and hemodynamically stable.   Delay start of Pharmacological VTE agent (>24hrs) due to surgical blood loss or risk of bleeding: yes

## 2020-10-05 ENCOUNTER — Inpatient Hospital Stay (HOSPITAL_COMMUNITY): Payer: Managed Care, Other (non HMO)

## 2020-10-05 ENCOUNTER — Encounter (HOSPITAL_COMMUNITY): Payer: Self-pay | Admitting: Surgery

## 2020-10-05 LAB — PREPARE CRYOPRECIPITATE
Unit division: 0
Unit division: 0

## 2020-10-05 LAB — POCT I-STAT 7, (LYTES, BLD GAS, ICA,H+H)
Acid-Base Excess: 0 mmol/L (ref 0.0–2.0)
Acid-base deficit: 2 mmol/L (ref 0.0–2.0)
Acid-base deficit: 2 mmol/L (ref 0.0–2.0)
Bicarbonate: 25.1 mmol/L (ref 20.0–28.0)
Bicarbonate: 22.2 mmol/L (ref 20.0–28.0)
Bicarbonate: 22.5 mmol/L (ref 20.0–28.0)
Calcium, Ion: 1.08 mmol/L — ABNORMAL LOW (ref 1.15–1.40)
Calcium, Ion: 1.12 mmol/L — ABNORMAL LOW (ref 1.15–1.40)
Calcium, Ion: 1.12 mmol/L — ABNORMAL LOW (ref 1.15–1.40)
HCT: 19 % — ABNORMAL LOW (ref 39.0–52.0)
HCT: 19 % — ABNORMAL LOW (ref 39.0–52.0)
HCT: 18 % — ABNORMAL LOW (ref 39.0–52.0)
Hemoglobin: 6.5 g/dL — CL (ref 13.0–17.0)
Hemoglobin: 6.5 g/dL — CL (ref 13.0–17.0)
Hemoglobin: 6.1 g/dL — CL (ref 13.0–17.0)
O2 Saturation: 99 %
O2 Saturation: 99 %
O2 Saturation: 99 %
Patient temperature: 36.2
Patient temperature: 36.7
Patient temperature: 98
Potassium: 3.9 mmol/L (ref 3.5–5.1)
Potassium: 3.4 mmol/L — ABNORMAL LOW (ref 3.5–5.1)
Potassium: 3.4 mmol/L — ABNORMAL LOW (ref 3.5–5.1)
Sodium: 139 mmol/L (ref 135–145)
Sodium: 140 mmol/L (ref 135–145)
Sodium: 141 mmol/L (ref 135–145)
TCO2: 26 mmol/L (ref 22–32)
TCO2: 23 mmol/L (ref 22–32)
TCO2: 24 mmol/L (ref 22–32)
pCO2 arterial: 37.9 mmHg (ref 32.0–48.0)
pCO2 arterial: 32.5 mmHg (ref 32.0–48.0)
pCO2 arterial: 35.8 mmHg (ref 32.0–48.0)
pH, Arterial: 7.425 (ref 7.350–7.450)
pH, Arterial: 7.442 (ref 7.350–7.450)
pH, Arterial: 7.405 (ref 7.350–7.450)
pO2, Arterial: 130 mmHg — ABNORMAL HIGH (ref 83.0–108.0)
pO2, Arterial: 116 mmHg — ABNORMAL HIGH (ref 83.0–108.0)
pO2, Arterial: 143 mmHg — ABNORMAL HIGH (ref 83.0–108.0)

## 2020-10-05 LAB — BASIC METABOLIC PANEL
Anion gap: 7 (ref 5–15)
Anion gap: 5 (ref 5–15)
BUN: 8 mg/dL (ref 6–20)
BUN: 8 mg/dL (ref 6–20)
CO2: 25 mmol/L (ref 22–32)
CO2: 26 mmol/L (ref 22–32)
Calcium: 7.2 mg/dL — ABNORMAL LOW (ref 8.9–10.3)
Calcium: 7.7 mg/dL — ABNORMAL LOW (ref 8.9–10.3)
Chloride: 104 mmol/L (ref 98–111)
Chloride: 106 mmol/L (ref 98–111)
Creatinine, Ser: 0.47 mg/dL — ABNORMAL LOW (ref 0.61–1.24)
Creatinine, Ser: 0.51 mg/dL — ABNORMAL LOW (ref 0.61–1.24)
GFR, Estimated: >60 mL/min (ref >60)
GFR, Estimated: >60 mL/min (ref >60)
Glucose, Bld: 148 mg/dL — ABNORMAL HIGH (ref 70–99)
Glucose, Bld: 131 mg/dL — ABNORMAL HIGH (ref 70–99)
Potassium: 3.8 mmol/L (ref 3.5–5.1)
Potassium: 3.8 mmol/L (ref 3.5–5.1)
Sodium: 136 mmol/L (ref 135–145)
Sodium: 137 mmol/L (ref 135–145)

## 2020-10-05 LAB — CBC
HCT: 21.3 % — ABNORMAL LOW (ref 39.0–52.0)
HCT: 23 % — ABNORMAL LOW (ref 39.0–52.0)
Hemoglobin: 7.1 g/dL — ABNORMAL LOW (ref 13.0–17.0)
Hemoglobin: 7.6 g/dL — ABNORMAL LOW (ref 13.0–17.0)
MCH: 29.2 pg (ref 26.0–34.0)
MCH: 29.8 pg (ref 26.0–34.0)
MCHC: 33.3 g/dL (ref 30.0–36.0)
MCHC: 33 g/dL (ref 30.0–36.0)
MCV: 87.7 fL (ref 80.0–100.0)
MCV: 90.2 fL (ref 80.0–100.0)
Platelets: 155 10*3/uL (ref 150–400)
Platelets: 172 10*3/uL (ref 150–400)
RBC: 2.43 MIL/uL — ABNORMAL LOW (ref 4.22–5.81)
RBC: 2.55 MIL/uL — ABNORMAL LOW (ref 4.22–5.81)
RDW: 16.9 % — ABNORMAL HIGH (ref 11.5–15.5)
RDW: 16.6 % — ABNORMAL HIGH (ref 11.5–15.5)
WBC: 8.4 10*3/uL (ref 4.0–10.5)
WBC: 9.5 10*3/uL (ref 4.0–10.5)
nRBC: 0 % (ref 0.0–0.2)
nRBC: 0 % (ref 0.0–0.2)

## 2020-10-05 LAB — PREPARE FRESH FROZEN PLASMA: Unit division: 0

## 2020-10-05 LAB — MAGNESIUM
Magnesium: 3 mg/dL — ABNORMAL HIGH (ref 1.7–2.4)
Magnesium: 2.5 mg/dL — ABNORMAL HIGH (ref 1.7–2.4)

## 2020-10-05 LAB — ECHO INTRAOPERATIVE TEE
AV Mean grad: 7 mmHg
AV Peak grad: 13.4 mmHg
Ao pk vel: 1.83 m/s
Height: 68 in
S' Lateral: 2.67 cm
Weight: 2982.4 oz

## 2020-10-05 LAB — BPAM CRYOPRECIPITATE
Blood Product Expiration Date: 202206152119
Blood Product Expiration Date: 202206152119
ISSUE DATE / TIME: 202206151743
ISSUE DATE / TIME: 202206151743
Unit Type and Rh: 6200
Unit Type and Rh: 6200

## 2020-10-05 LAB — PREPARE PLATELET PHERESIS
Unit division: 0
Unit division: 0

## 2020-10-05 LAB — BPAM PLATELET PHERESIS
Blood Product Expiration Date: 202206152359
Blood Product Expiration Date: 202206162359
ISSUE DATE / TIME: 202206151742
ISSUE DATE / TIME: 202206151742
Unit Type and Rh: 5100
Unit Type and Rh: 6200

## 2020-10-05 LAB — GLUCOSE, CAPILLARY
Glucose-Capillary: 111 mg/dL — ABNORMAL HIGH (ref 70–99)
Glucose-Capillary: 111 mg/dL — ABNORMAL HIGH (ref 70–99)
Glucose-Capillary: 115 mg/dL — ABNORMAL HIGH (ref 70–99)
Glucose-Capillary: 118 mg/dL — ABNORMAL HIGH (ref 70–99)
Glucose-Capillary: 120 mg/dL — ABNORMAL HIGH (ref 70–99)
Glucose-Capillary: 126 mg/dL — ABNORMAL HIGH (ref 70–99)
Glucose-Capillary: 130 mg/dL — ABNORMAL HIGH (ref 70–99)
Glucose-Capillary: 136 mg/dL — ABNORMAL HIGH (ref 70–99)
Glucose-Capillary: 139 mg/dL — ABNORMAL HIGH (ref 70–99)
Glucose-Capillary: 139 mg/dL — ABNORMAL HIGH (ref 70–99)

## 2020-10-05 LAB — BPAM FFP
Blood Product Expiration Date: 202206202359
Blood Product Expiration Date: 202206202359
ISSUE DATE / TIME: 202206151801
ISSUE DATE / TIME: 202206151801
Unit Type and Rh: 7300
Unit Type and Rh: 7300

## 2020-10-05 LAB — PREPARE RBC (CROSSMATCH)

## 2020-10-05 MED ORDER — POTASSIUM CHLORIDE 10 MEQ/50ML IV SOLN
10.0000 meq | INTRAVENOUS | Status: AC
  Administered 2020-10-05 (×4): 10 meq via INTRAVENOUS
  Filled 2020-10-05 (×4): qty 50

## 2020-10-05 MED ORDER — METOCLOPRAMIDE HCL 5 MG/ML IJ SOLN
10.0000 mg | Freq: Four times a day (QID) | INTRAMUSCULAR | Status: AC
  Administered 2020-10-05 – 2020-10-06 (×4): 10 mg via INTRAVENOUS
  Filled 2020-10-05 (×4): qty 2

## 2020-10-05 MED ORDER — INSULIN DETEMIR 100 UNIT/ML ~~LOC~~ SOLN: 20.0000 [IU] | Freq: Two times a day (BID) | SUBCUTANEOUS | Status: DC

## 2020-10-05 MED ORDER — FUROSEMIDE 10 MG/ML IJ SOLN
40.0000 mg | Freq: Four times a day (QID) | INTRAMUSCULAR | Status: AC
  Administered 2020-10-05 (×2): 40 mg via INTRAVENOUS
  Filled 2020-10-05 (×2): qty 4

## 2020-10-05 MED ORDER — SODIUM CHLORIDE 0.9% IV SOLUTION: Freq: Once | INTRAVENOUS | Status: AC

## 2020-10-05 MED ORDER — INSULIN ASPART 100 UNIT/ML IJ SOLN
0.0000 [IU] | INTRAMUSCULAR | Status: DC
  Administered 2020-10-05 (×2): 2 [IU] via SUBCUTANEOUS

## 2020-10-05 MED ORDER — ORAL CARE MOUTH RINSE
15.0000 mL | Freq: Two times a day (BID) | OROMUCOSAL | Status: DC
  Administered 2020-10-05 – 2020-10-10 (×10): 15 mL via OROMUCOSAL

## 2020-10-05 MED ORDER — TRAMADOL HCL 50 MG PO TABS
50.0000 mg | ORAL_TABLET | ORAL | Status: DC | PRN
Start: 2020-10-05 — End: 2020-10-07
  Administered 2020-10-05 – 2020-10-07 (×4): 50 mg via ORAL
  Filled 2020-10-05 (×4): qty 1

## 2020-10-05 MED ORDER — INSULIN DETEMIR 100 UNIT/ML ~~LOC~~ SOLN
20.0000 [IU] | Freq: Two times a day (BID) | SUBCUTANEOUS | Status: DC
  Administered 2020-10-05 (×2): 20 [IU] via SUBCUTANEOUS
  Filled 2020-10-05 (×4): qty 0.2

## 2020-10-05 MED ORDER — CHLORHEXIDINE GLUCONATE 0.12% ORAL RINSE (MEDLINE KIT)
15.0000 mL | Freq: Two times a day (BID) | OROMUCOSAL | Status: DC
  Administered 2020-10-05: 15 mL via OROMUCOSAL

## 2020-10-05 MED ORDER — ORAL CARE MOUTH RINSE
15.0000 mL | OROMUCOSAL | Status: DC
  Administered 2020-10-05 (×5): 15 mL via OROMUCOSAL

## 2020-10-05 MED FILL — Albumin, Human Inj 5%: INTRAVENOUS | Qty: 250 | Status: AC

## 2020-10-05 MED FILL — Sodium Bicarbonate IV Soln 8.4%: INTRAVENOUS | Qty: 50 | Status: AC

## 2020-10-05 MED FILL — Heparin Sodium (Porcine) Inj 1000 Unit/ML: INTRAMUSCULAR | Qty: 30 | Status: AC

## 2020-10-05 MED FILL — Thrombin (Recombinant) For Soln 20000 Unit: CUTANEOUS | Qty: 1 | Status: AC

## 2020-10-05 MED FILL — Epinephrine Inj 30 MG/30ML (1 MG/ML) (1:1000): INTRAMUSCULAR | Qty: 5 | Status: AC

## 2020-10-05 MED FILL — Magnesium Sulfate Inj 50%: INTRAMUSCULAR | Qty: 10 | Status: AC

## 2020-10-05 MED FILL — Potassium Chloride Inj 2 mEq/ML: INTRAVENOUS | Qty: 40 | Status: AC

## 2020-10-05 MED FILL — Sodium Chloride IV Soln 0.9%: INTRAVENOUS | Qty: 250 | Status: AC

## 2020-10-05 MED FILL — Lidocaine HCl Local Soln Prefilled Syringe 100 MG/5ML (2%): INTRAMUSCULAR | Qty: 30 | Status: AC

## 2020-10-05 MED FILL — Electrolyte-R (PH 7.4) Solution: INTRAVENOUS | Qty: 6000 | Status: AC

## 2020-10-05 MED FILL — Mannitol IV Soln 20%: INTRAVENOUS | Qty: 500 | Status: AC

## 2020-10-05 MED FILL — Sodium Chloride IV Soln 0.9%: INTRAVENOUS | Qty: 2000 | Status: AC

## 2020-10-05 NOTE — Progress Notes (Signed)
1 Day Post-Op Procedure(s) (LRB): REDO STERNOTOMY (N/A) REDO BENTALL PROCEDURE ON PUMP WITH A HEMASHIELD PLATINUM GRAFT AND HOMOGRAFT (N/A) TRANSESOPHAGEAL ECHOCARDIOGRAM (TEE) (N/A) APPLICATION OF CELL SAVER ENDOVEIN HARVEST OF GREATER SAPHENOUS VEIN (Right) Subjective: Intubated but awake and following commands. Nods he is comfortable.  Objective: Vital signs in last 24 hours: Temp:  [94.64 F (34.8 C)-98.42 F (36.9 C)] 96.98 F (36.1 C) (06/16 0630) Pulse Rate:  [67-90] 80 (06/16 0545) Cardiac Rhythm: Atrial paced (06/16 0400) Resp:  [10-20] 14 (06/16 0630) BP: (86-182)/(61-104) 117/71 (06/16 0630) SpO2:  [95 %-100 %] 100 % (06/16 0545) Arterial Line BP: (93-166)/(46-91) 128/63 (06/16 0630) FiO2 (%):  [50 %] 50 % (06/16 0400) Weight:  [92.8 kg] 92.8 kg (06/16 0500)  Hemodynamic parameters for last 24 hours: PAP: (8-36)/(-10-26) 22/13 CO:  [3.9 L/min-5.4 L/min] 5.3 L/min CI:  [2 L/min/m2-2.7 L/min/m2] 2.6 L/min/m2  Intake/Output from previous day: 06/15 0701 - 06/16 0700 In: 8493.2 [I.V.:5267.4; Blood:1389; IV Piggyback:1836.8] Out: 3755 [Urine:3285; Emesis/NG output:200; Chest Tube:270] Intake/Output this shift: No intake/output data recorded.  General appearance: alert and cooperative Neurologic: intact Heart: regular rate and rhythm, S1, S2 normal, no murmur Lungs: clear to auscultation bilaterally Abdomen: soft, non-tender Extremities: edema mild Wound: dressings dry  Lab Results: Recent Labs    10/04/20 2111 10/04/20 2112 10/05/20 0419 10/05/20 0421  WBC 10.8*  --  8.4  --   HGB 7.4*   < > 7.1* 6.5*  HCT 22.2*   < > 21.3* 19.0*  PLT 133*  --  155  --    < > = values in this interval not displayed.   BMET:  Recent Labs    10/04/20 0239 10/04/20 0921 10/04/20 1859 10/04/20 2112 10/05/20 0419 10/05/20 0421  NA 135   < > 139   < > 136 139  K 4.2   < > 3.9   < > 3.8 3.9  CL 100   < > 102  --  104  --   CO2 27  --   --   --  25  --    GLUCOSE 191*   < > 164*  --  148*  --   BUN 11   < > 9  --  8  --   CREATININE 0.78   < > 0.30*  --  0.47*  --   CALCIUM 9.2  --   --   --  7.2*  --    < > = values in this interval not displayed.    PT/INR:  Recent Labs    10/04/20 2111  LABPROT 17.2*  INR 1.4*   ABG    Component Value Date/Time   PHART 7.425 10/05/2020 0421   HCO3 25.1 10/05/2020 0421   TCO2 26 10/05/2020 0421   ACIDBASEDEF 2.0 10/04/2020 1415   O2SAT 99.0 10/05/2020 0421   CBG (last 3)  Recent Labs    10/05/20 0208 10/05/20 0418 10/05/20 0605  GLUCAP 126* 139* 111*   CXR: clear  ECG: sinus brady 54, no acute changes.  Assessment/Plan: S/P Procedure(s) (LRB): REDO STERNOTOMY (N/A) REDO BENTALL PROCEDURE ON PUMP WITH A HEMASHIELD PLATINUM GRAFT AND HOMOGRAFT (N/A) TRANSESOPHAGEAL ECHOCARDIOGRAM (TEE) (N/A) APPLICATION OF CELL SAVER ENDOVEIN HARVEST OF GREATER SAPHENOUS VEIN (Right)  POD 1  Hemodynamically stable over night on low dose neo this am. Atrial paced. Will hold off on beta blocker for now while on neo and pacing with sinus brady.  Wean to extubate.  Start  diuresis after extubation.   Keep chest tubes in today.  Continue Rocephin for Agregatobacter endocarditis.  DM: glucose under good control on insulin drip. Transition to Levemir and SSI.   LOS: 9 days    [name] 10/05/2020

## 2020-10-05 NOTE — Progress Notes (Signed)
Patient ID: Ronnie Ruiz, male   DOB: 1965/05/18, 55 y.o.   MRN: [id number]  TCTS Evening Rounds:   Hemodynamically stable  Atrial paced. Extubated today without difficulty. Alert and says he feels fine. Transfused one unit PRBC's for Hgb 6.5 on ABG and 7.1 by CBC  Urine output good with lasix. CT output low  CBC    Component Value Date/Time   WBC 8.4 10/05/2020 0419   RBC 2.43 (L) 10/05/2020 0419   HGB 6.1 (LL) 10/05/2020 1408   HGB 13.7 02/28/2020 0821   HCT 18.0 (L) 10/05/2020 1408   HCT 40.0 02/28/2020 0821   PLT 155 10/05/2020 0419   PLT 202 02/28/2020 0821   MCV 87.7 10/05/2020 0419   MCV 95 02/28/2020 0821   MCH 29.2 10/05/2020 0419   MCHC 33.3 10/05/2020 0419   RDW 16.9 (H) 10/05/2020 0419   RDW 12.3 02/28/2020 0821   LYMPHSABS 2.4 10/03/2020 0158   LYMPHSABS 2.3 02/28/2020 0821   MONOABS 0.8 10/03/2020 0158   EOSABS 0.3 10/03/2020 0158   EOSABS 0.3 02/28/2020 0821   BASOSABS 0.1 10/03/2020 0158   BASOSABS 0.1 02/28/2020 0821     BMET    Component Value Date/Time   NA 141 10/05/2020 1408   NA 139 02/28/2020 0821   K 3.4 (L) 10/05/2020 1408   CL 104 10/05/2020 0419   CO2 25 10/05/2020 0419   GLUCOSE 148 (H) 10/05/2020 0419   BUN 8 10/05/2020 0419   BUN 8 02/28/2020 0821   CREATININE 0.47 (L) 10/05/2020 0419   CALCIUM 7.2 (L) 10/05/2020 0419   GFRNONAA >60 10/05/2020 0419   GFRAA 117 02/28/2020 0821     A/P:  Stable postop course. Continue current plans. Keep chest tubes in today. Continue diuresis, IS.

## 2020-10-05 NOTE — Procedures (Signed)
Extubation Procedure Note  Patient Details:   Name: Ronnie Ruiz DOB: Oct 24, 1965 MRN: [id number]   Airway Documentation:    Vent end date: 10/05/20 Vent end time: 1304   Evaluation  O2 sats: stable throughout Complications: No apparent complications Patient did tolerate procedure well. Bilateral Breath Sounds: Clear   Yes  Positive cuff leak noted. NIF - 36, VC 4.43.  Patient placed on [place] 4L with humidity, no stridor noted. RN assisting patient with the incentive spirometer.  Forest Becker Aron Needles 10/05/2020, 1:20 PM

## 2020-10-05 NOTE — Progress Notes (Signed)
Pre-extubation ABG. Dr. Laneta Simmers aware of K 3.4 and Hgb 6.5. New orders received.       Results for Ronnie Ruiz, Ronnie Ruiz (MRN [id number]) as of 10/05/2020 14:52  Ref. Range 10/05/2020 12:50  Sample type Unknown ARTERIAL  pH, Arterial Latest Ref Range: 7.350 - 7.450  7.442  pCO2 arterial Latest Ref Range: 32.0 - 48.0 mmHg 32.5  pO2, Arterial Latest Ref Range: 83.0 - 108.0 mmHg 116 (H)  TCO2 Latest Ref Range: 22 - 32 mmol/L 23  Acid-base deficit Latest Ref Range: 0.0 - 2.0 mmol/L 2.0  Bicarbonate Latest Ref Range: 20.0 - 28.0 mmol/L 22.2  O2 Saturation Latest Units: % 99.0  Patient temperature Unknown 36.7 C  Sodium Latest Ref Range: 135 - 145 mmol/L 140  Potassium Latest Ref Range: 3.5 - 5.1 mmol/L 3.4 (L)  Calcium Ionized Latest Ref Range: 1.15 - 1.40 mmol/L 1.12 (L)  Hemoglobin Latest Ref Range: 13.0 - 17.0 g/dL 6.5 (LL)  HCT Latest Ref Range: 39.0 - 52.0 % 19.0 (L)

## 2020-10-05 NOTE — Progress Notes (Signed)
Nutrition Follow-up  DOCUMENTATION CODES:   Severe malnutrition in context of acute illness/injury  INTERVENTION:   When able to take POs after extubation, will add PO snacks/supplements to maximize protein intake. If unable to extubate today, recommend begin TF via OGT with Vital 1.5 at 65 ml/h with Prosource TF 45 ml TID to provide 2460 kcal, 138 gm protein, 1192 ml free water daily.  NUTRITION DIAGNOSIS:   Severe Malnutrition related to acute illness (endocarditis) as evidenced by moderate muscle depletion, percent weight loss (9% weight loss within 1 month).  Ongoing  GOAL:   Patient will meet greater than or equal to 90% of their needs  Unmet  MONITOR:   PO intake, Supplement acceptance, Labs  REASON FOR ASSESSMENT:   Malnutrition Screening Tool    ASSESSMENT:   55 yo male transferred from OSH for TEE to evaluate for endocarditis, aggregatobacter septicemia. PMH includes DM-2, HTN, HLD, bicuspid aortic valve disease s/p bioprosthetic valve replacement.  S/P TEE on 6/9. Required redo sternotomy 6/15. Initial surgery was on 6/2. Remains on vent this morning with plans to extubate today.   Previously on a heart healthy carbohydrate modified diet consuming 100% of meals. Ensure Max Protein was added 6/9 to maximize protein intake, however, patient was refusing it per [facility].  Labs reviewed. Mag 3, Hgb 6.5 CBG: 111-120-118  Medications reviewed and include novolog, levemir, mag sulfate, neosynephrine. Transitioned off insulin drip this morning. IVF: LR at 10 ml/h; 1/2 NS at 10 ml/h  Chest tube output 270 ml x 24 hours, 40 ml out so far this morning. I/O +8 L since admission UOP 3285 ml x 24 hours  Admission weight 86 kg Currently 92.8 kg  Diet Order:   Diet Order             Diet Carb Modified Fluid consistency: Thin; Room service appropriate? Yes  Diet effective now                   EDUCATION NEEDS:   Education needs have been addressed  Skin:   Skin Assessment: Reviewed RN Assessment  Last BM:  6/13  Height:   Ht Readings from Last 1 Encounters:  09/26/20 5\' 8"  (1.727 m)    Weight:   Wt Readings from Last 1 Encounters:  10/05/20 92.8 kg    Ideal Body Weight:  70 kg  BMI:  Body mass index is 31.11 kg/m.  Estimated Nutritional Needs:   Kcal:  2400-2600  Protein:  120-140 gm  Fluid:  >/= 2.4 L    10/07/20, RD, LDN, CNSC Please refer to Amion for contact information.

## 2020-10-05 NOTE — Hospital Course (Addendum)
HPI:   55 yo man with a history of bicuspid aortic valve, ascending aortic aneurysm, Bentall procedure ([facility]KB in 2020), type 2 diabetes, hypertension, hyperlipidemia, ethanol abuse, and reflux. Was in his usual state of health until late May. First noted problems after having a cortisol shot in his left shoulder. CBG were elevated. Starting feeling fatigued, then night sweats. Was admitted to [facility]. Noted to have AKI, uncontrolled DM. Blood cultures positive for Aggregatibacter, started on Ceftriaxone. Transferred to [facility] on 6/7. TEE showed echolucency posterior to valve. Cardiac CT showed possible perivalvular abscess.   Currently feels well. Denies chest pain, shortness of breath, fever.   Has not seen a dentist since his Bentall.  Hospital Course:   On 10/04/2020 Ronnie Ruiz underwent a Redo sternotomy, replacement of a ascending aortic graft using a 89mm hemashield platinum graft and redo bentall procure ruing a 37mm aortic homograft valve/root. He tolerated the procedure well and was transferred to the surgical ICU in stable consition. POD 1 he remained hemodynamically stable. We were working on weaning the patient to extubate. He is pacing and underlying rhythm is sinus bradycardia. He remains on a low-dose of Neo and Epi was discontinued. He remains on Rocephin for Agregatobacter endocarditis. POD 2 we discontinued his chest tubes. He did received one unit of pRBC with an improvement in his hemoglobin. We continued his diuretic regimen for fluid overload. His weight was decreasing accordingly.

## 2020-10-05 NOTE — Progress Notes (Signed)
[facility]   Reason for visit: Follow up on prosthetic valve endocarditis  Interval History: now s/p redo valve replacement, WBC 8.4, afebrile.  Has been on low dose neo for his pressure.       Physical Exam: Constitutional:  Vitals:   10/05/20 1045 10/05/20 1112  BP:  (!) 103/55  Pulse: 80 80  Resp: 13 12  Temp: (!) 97.16 F (36.2 C)   SpO2: 100% 100%   patient appears in NAD Eyes: anicteric; eyes open HENT: +ET Respiratory: respiratory effort on vent; CTA B Cardiovascular: tachy RR Neuro: alert, responding to questions with head nod  Review of Systems: Constitutional: negative for fevers and chills Gastrointestinal: negative for diarrhea  Lab Results  Component Value Date   WBC 8.4 10/05/2020   HGB 6.5 (LL) 10/05/2020   HCT 19.0 (L) 10/05/2020   MCV 87.7 10/05/2020   PLT 155 10/05/2020    Lab Results  Component Value Date   CREATININE 0.47 (L) 10/05/2020   BUN 8 10/05/2020   NA 139 10/05/2020   K 3.9 10/05/2020   CL 104 10/05/2020   CO2 25 10/05/2020    Lab Results  Component Value Date   ALT 25 09/26/2020   AST 24 09/26/2020   ALKPHOS 60 09/26/2020     Microbiology: Recent Results (from the past 240 hour(s))  Culture, blood (routine x 2)     Status: None   Collection Time: 09/26/20 11:48 PM   Specimen: BLOOD  Result Value Ref Range Status   Specimen Description BLOOD SITE NOT SPECIFIED  Final   Special Requests   Final    BOTTLES DRAWN AEROBIC AND ANAEROBIC Blood Culture results may not be optimal due to an excessive volume of blood received in culture bottles   Culture   Final    NO GROWTH 5 DAYS Performed at [facility], [address], [place], [place] [address]    Report Status 10/02/2020 FINAL  Final  Culture, blood (routine x 2)     Status: None   Collection Time: 09/26/20 11:48 PM   Specimen: BLOOD  Result Value Ref Range Status   Specimen Description BLOOD SITE NOT SPECIFIED  Final   Special  Requests   Final    BOTTLES DRAWN AEROBIC AND ANAEROBIC Blood Culture adequate volume   Culture   Final    NO GROWTH 5 DAYS Performed at [facility], [address], [place], [place] [address]    Report Status 10/02/2020 FINAL  Final  SARS CORONAVIRUS 2 (TAT 6-24 HRS) Nasopharyngeal Nasopharyngeal Swab     Status: None   Collection Time: 09/27/20  3:20 AM   Specimen: Nasopharyngeal Swab  Result Value Ref Range Status   SARS Coronavirus 2 NEGATIVE NEGATIVE Final    Comment: (NOTE) SARS-CoV-2 target nucleic acids are NOT DETECTED.  The SARS-CoV-2 RNA is generally detectable in upper and lower respiratory specimens during the acute phase of infection. Negative results do not preclude SARS-CoV-2 infection, do not rule out co-infections with other pathogens, and should not be used as the sole basis for treatment or other patient management decisions. Negative results must be combined with clinical observations, patient history, and epidemiological information. The expected result is Negative.  Fact Sheet for Patients: HairSlick.no  Fact Sheet for Healthcare Providers: quierodirigir.com  This test is not yet approved or cleared by the [place] FDA and  has been authorized for detection and/or diagnosis of SARS-CoV-2 by FDA under  an Emergency Use Authorization (EUA). This EUA will remain  in effect (meaning this test can be used) for the duration of the COVID-19 declaration under Se ction 564(b)(1) of the Act, 21 U.S.C. section 360bbb-3(b)(1), unless the authorization is terminated or revoked sooner.  Performed at [facility], [address] [place], [place] [address]   Surgical pcr screen     Status: None   Collection Time: 10/03/20  8:56 AM   Specimen: Nasal Mucosa; Nasal Swab  Result Value Ref Range Status   MRSA, PCR NEGATIVE NEGATIVE Final   Staphylococcus aureus NEGATIVE NEGATIVE Final    Comment:  (NOTE) The Xpert SA Assay (FDA approved for NASAL specimens in patients 55 years of age and older), is one component of a comprehensive surveillance program. It is not intended to diagnose infection nor to guide or monitor treatment. Performed at [facility], [address], [place], [place] [address]   Aerobic/Anaerobic Culture w Gram Stain (surgical/deep wound)     Status: None (Preliminary result)   Collection Time: 10/04/20  2:28 PM   Specimen: PATH Soft tissue resection  Result Value Ref Range Status   Specimen Description WOUND  Final   Special Requests GORTEX GRAFT  Final   Gram Stain PENDING  Incomplete   Culture   Final    NO GROWTH < 24 HOURS Performed at [facility], [address] [place], [place] [address]    Report Status PENDING  Incomplete  Aerobic/Anaerobic Culture w Gram Stain (surgical/deep wound)     Status: None (Preliminary result)   Collection Time: 10/04/20  2:31 PM   Specimen: PATH Soft tissue  Result Value Ref Range Status   Specimen Description ABSCESS  Final   Special Requests PERIAORTIC PT ON ANCEF VANC  Final   Gram Stain PENDING  Incomplete   Culture   Final    NO GROWTH < 24 HOURS Performed at [facility], [address], [place], [place] [address]    Report Status PENDING  Incomplete    Impression/Plan:  1. 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 Laneta Simmers.  On ceftriaxone and sensitive.  Planning on 6 week course of ceftriaxone post surgery.    2.  Access - picc line ok from ID standpoint when needed.     3.  DM - has been poorly controlled.  Will need good blood sugar control.  Hgb A1c of 8.7.

## 2020-10-05 NOTE — Addendum Note (Signed)
Addendum  created 10/05/20 0802 by Adair Laundry, CRNA   Order list changed, Pharmacy for encounter modified

## 2020-10-05 NOTE — Plan of Care (Signed)
  Problem: Clinical Measurements: Goal: Ability to maintain clinical measurements within normal limits will improve Outcome: Progressing Goal: Cardiovascular complication will be avoided Outcome: Progressing   Problem: Pain Managment: Goal: General experience of comfort will improve Outcome: Progressing   

## 2020-10-05 NOTE — Anesthesia Postprocedure Evaluation (Signed)
Anesthesia Post Note  Patient: Ronnie Ruiz  Procedure(s) Performed: REDO STERNOTOMY REDO BENTALL PROCEDURE ON PUMP WITH A HEMASHIELD PLATINUM GRAFT AND HOMOGRAFT (Chest) TRANSESOPHAGEAL ECHOCARDIOGRAM (TEE) APPLICATION OF CELL SAVER ENDOVEIN HARVEST OF GREATER SAPHENOUS VEIN (Right)     Patient location during evaluation: SICU Anesthesia Type: General Level of consciousness: sedated Pain management: pain level controlled Vital Signs Assessment: post-procedure vital signs reviewed and stable Respiratory status: patient remains intubated per anesthesia plan Cardiovascular status: stable Postop Assessment: no apparent nausea or vomiting Anesthetic complications: no   No notable events documented.  Last Vitals:  Vitals:   10/05/20 0600 10/05/20 0615  BP: (!) 86/62 104/69  Pulse:    Resp: 14 14  Temp: (!) 36.1 C (!) 36.1 C  SpO2:      Last Pain:  Vitals:   10/05/20 0400  TempSrc: Core  PainSc:                  Ronnie Ruiz

## 2020-10-06 ENCOUNTER — Inpatient Hospital Stay: Payer: Self-pay

## 2020-10-06 ENCOUNTER — Inpatient Hospital Stay (HOSPITAL_COMMUNITY): Payer: Managed Care, Other (non HMO)

## 2020-10-06 DIAGNOSIS — I330 Acute and subacute infective endocarditis: Secondary | ICD-10-CM | POA: Diagnosis not present

## 2020-10-06 DIAGNOSIS — Z952 Presence of prosthetic heart valve: Secondary | ICD-10-CM | POA: Diagnosis not present

## 2020-10-06 LAB — BASIC METABOLIC PANEL
Anion gap: 6 (ref 5–15)
BUN: 7 mg/dL (ref 6–20)
CO2: 28 mmol/L (ref 22–32)
Calcium: 7.8 mg/dL — ABNORMAL LOW (ref 8.9–10.3)
Chloride: 104 mmol/L (ref 98–111)
Creatinine, Ser: 0.54 mg/dL — ABNORMAL LOW (ref 0.61–1.24)
GFR, Estimated: >60 mL/min (ref >60)
Glucose, Bld: 96 mg/dL (ref 70–99)
Potassium: 3.3 mmol/L — ABNORMAL LOW (ref 3.5–5.1)
Sodium: 138 mmol/L (ref 135–145)

## 2020-10-06 LAB — GLUCOSE, CAPILLARY
Glucose-Capillary: 109 mg/dL — ABNORMAL HIGH (ref 70–99)
Glucose-Capillary: 119 mg/dL — ABNORMAL HIGH (ref 70–99)
Glucose-Capillary: 151 mg/dL — ABNORMAL HIGH (ref 70–99)
Glucose-Capillary: 154 mg/dL — ABNORMAL HIGH (ref 70–99)
Glucose-Capillary: 157 mg/dL — ABNORMAL HIGH (ref 70–99)
Glucose-Capillary: 86 mg/dL (ref 70–99)

## 2020-10-06 LAB — CBC
HCT: 23 % — ABNORMAL LOW (ref 39.0–52.0)
Hemoglobin: 7.7 g/dL — ABNORMAL LOW (ref 13.0–17.0)
MCH: 30 pg (ref 26.0–34.0)
MCHC: 33.5 g/dL (ref 30.0–36.0)
MCV: 89.5 fL (ref 80.0–100.0)
Platelets: 175 10*3/uL (ref 150–400)
RBC: 2.57 MIL/uL — ABNORMAL LOW (ref 4.22–5.81)
RDW: 16.7 % — ABNORMAL HIGH (ref 11.5–15.5)
WBC: 9.3 10*3/uL (ref 4.0–10.5)
nRBC: 0 % (ref 0.0–0.2)

## 2020-10-06 LAB — ACID FAST SMEAR (AFB, MYCOBACTERIA): Acid Fast Smear: NEGATIVE

## 2020-10-06 MED ORDER — POTASSIUM CHLORIDE 10 MEQ/50ML IV SOLN
10.0000 meq | INTRAVENOUS | Status: AC
  Administered 2020-10-06 (×4): 10 meq via INTRAVENOUS
  Filled 2020-10-06 (×4): qty 50

## 2020-10-06 MED ORDER — INSULIN DETEMIR 100 UNIT/ML ~~LOC~~ SOLN
20.0000 [IU] | Freq: Every day | SUBCUTANEOUS | Status: DC
  Administered 2020-10-06 – 2020-10-10 (×5): 20 [IU] via SUBCUTANEOUS
  Filled 2020-10-06 (×5): qty 0.2

## 2020-10-06 MED ORDER — FUROSEMIDE 10 MG/ML IJ SOLN
40.0000 mg | Freq: Two times a day (BID) | INTRAMUSCULAR | Status: AC
  Administered 2020-10-06 (×2): 40 mg via INTRAVENOUS
  Filled 2020-10-06 (×2): qty 4

## 2020-10-06 MED ORDER — FE FUMARATE-B12-VIT C-FA-IFC PO CAPS
1.0000 | ORAL_CAPSULE | Freq: Two times a day (BID) | ORAL | Status: DC
  Administered 2020-10-06 – 2020-10-10 (×9): 1 via ORAL
  Filled 2020-10-06 (×9): qty 1

## 2020-10-06 MED ORDER — SODIUM CHLORIDE 0.9% FLUSH
10.0000 mL | Freq: Two times a day (BID) | INTRAVENOUS | Status: DC
  Administered 2020-10-06: 10 mL
  Administered 2020-10-07 (×2): 20 mL
  Administered 2020-10-08 (×3): 10 mL
  Administered 2020-10-09: 30 mL
  Administered 2020-10-09 – 2020-10-10 (×2): 10 mL

## 2020-10-06 MED ORDER — POTASSIUM CHLORIDE 10 MEQ/50ML IV SOLN
10.0000 meq | INTRAVENOUS | Status: AC
  Administered 2020-10-06 (×3): 10 meq via INTRAVENOUS
  Filled 2020-10-06 (×3): qty 50

## 2020-10-06 MED ORDER — POTASSIUM CHLORIDE CRYS ER 20 MEQ PO TBCR
40.0000 meq | EXTENDED_RELEASE_TABLET | Freq: Once | ORAL | Status: AC
  Administered 2020-10-06: 40 meq via ORAL
  Filled 2020-10-06: qty 2

## 2020-10-06 MED ORDER — METOPROLOL TARTRATE 12.5 MG HALF TABLET
12.5000 mg | ORAL_TABLET | Freq: Two times a day (BID) | ORAL | Status: DC
  Administered 2020-10-06 – 2020-10-10 (×9): 12.5 mg via ORAL
  Filled 2020-10-06 (×9): qty 1

## 2020-10-06 MED ORDER — SODIUM CHLORIDE 0.9 % IV SOLN: INTRAVENOUS | Status: DC | PRN

## 2020-10-06 MED ORDER — INSULIN ASPART 100 UNIT/ML IJ SOLN
0.0000 [IU] | Freq: Three times a day (TID) | INTRAMUSCULAR | Status: DC
  Administered 2020-10-06 – 2020-10-08 (×4): 2 [IU] via SUBCUTANEOUS
  Administered 2020-10-08: 8 [IU] via SUBCUTANEOUS
  Administered 2020-10-09 (×2): 2 [IU] via SUBCUTANEOUS
  Administered 2020-10-10: 4 [IU] via SUBCUTANEOUS

## 2020-10-06 MED ORDER — SODIUM CHLORIDE 0.9% FLUSH: 10.0000 mL | INTRAVENOUS | Status: DC | PRN

## 2020-10-06 NOTE — Progress Notes (Signed)
Per Dr. Luciana Axe, infectious disease, okay to place PICC.

## 2020-10-06 NOTE — Progress Notes (Signed)
[facility] BRIEF SICU PROGRESS NOTE  2 Days Post-Op  S/P Procedure(s) (LRB): REDO STERNOTOMY (N/A) REDO BENTALL PROCEDURE ON PUMP WITH A HEMASHIELD PLATINUM GRAFT AND HOMOGRAFT (N/A) TRANSESOPHAGEAL ECHOCARDIOGRAM (TEE) (N/A) APPLICATION OF CELL SAVER ENDOVEIN HARVEST OF GREATER SAPHENOUS VEIN (Right)   Stable day NSR w/ stable BP Breathing comfortably on room air Diuresing well  Plan: Continue current plan  Purcell Nails, MD 10/06/2020 5:55 PM

## 2020-10-06 NOTE — Progress Notes (Signed)
Peripherally Inserted Central Catheter Placement  The IV Nurse has discussed with the patient and/or persons authorized to consent for the patient, the purpose of this procedure and the potential benefits and risks involved with this procedure.  The benefits include less needle sticks, lab draws from the catheter, and the patient may be discharged home with the catheter. Risks include, but not limited to, infection, bleeding, blood clot (thrombus formation), and puncture of an artery; nerve damage and irregular heartbeat and possibility to perform a PICC exchange if needed/ordered by physician.  Alternatives to this procedure were also discussed.  Bard Power PICC patient education guide, fact sheet on infection prevention and patient information card has been provided to patient /or left at bedside.    PICC Placement Documentation  PICC Double Lumen 10/06/20 PICC Right Brachial 42 cm 0 cm (Active)  Indication for Insertion or Continuance of Line Prolonged intravenous therapies 10/06/20 1213  Exposed Catheter (cm) 0 cm 10/06/20 1213  Site Assessment Clean;Dry;Intact 10/06/20 1213  Lumen #1 Status Flushed;Saline locked;Blood return noted 10/06/20 1213  Lumen #2 Status Flushed;Saline locked;Blood return noted 10/06/20 1213  Dressing Type Transparent 10/06/20 1213  Dressing Status Clean;Dry;Intact 10/06/20 1213  Antimicrobial disc in place? Yes 10/06/20 1213  Safety Lock Not Applicable 10/06/20 1213  Line Care Connections checked and tightened 10/06/20 1213  Line Adjustment (NICU/IV Team Only) No 10/06/20 1213  Dressing Intervention New dressing 10/06/20 1213  Dressing Change Due 10/13/20 10/06/20 1213       Jerame Hedding, Lajean Manes 10/06/2020, 12:14 PM

## 2020-10-06 NOTE — Progress Notes (Signed)
[facility]   Reason for visit: Follow up on prosthetic perivalvular abscess  Interval History: now extubated and off oxygen this am; WBC 9.3, wife at bedside.  Remains afebrile.  See this am   Physical Exam: Constitutional:  Vitals:   10/06/20 1200 10/06/20 1300  BP:  101/63  Pulse: 84 75  Resp: 15 12  Temp:    SpO2: 96% 96%   patient appears in NAD Respiratory: Normal respiratory effort; CTA B Cardiovascular: RRR GI: soft, nt, nd  Review of Systems: Constitutional: negative for fevers and chills Gastrointestinal: negative for diarrhea  Lab Results  Component Value Date   WBC 9.3 10/06/2020   HGB 7.7 (L) 10/06/2020   HCT 23.0 (L) 10/06/2020   MCV 89.5 10/06/2020   PLT 175 10/06/2020    Lab Results  Component Value Date   CREATININE 0.54 (L) 10/06/2020   BUN 7 10/06/2020   NA 138 10/06/2020   K 3.3 (L) 10/06/2020   CL 104 10/06/2020   CO2 28 10/06/2020    Lab Results  Component Value Date   ALT 25 09/26/2020   AST 24 09/26/2020   ALKPHOS 60 09/26/2020     Microbiology: Recent Results (from the past 240 hour(s))  Culture, blood (routine x 2)     Status: None   Collection Time: 09/26/20 11:48 PM   Specimen: BLOOD  Result Value Ref Range Status   Specimen Description BLOOD SITE NOT SPECIFIED  Final   Special Requests   Final    BOTTLES DRAWN AEROBIC AND ANAEROBIC Blood Culture results may not be optimal due to an excessive volume of blood received in culture bottles   Culture   Final    NO GROWTH 5 DAYS Performed at [facility], [address], [place], [place] [address]    Report Status 10/02/2020 FINAL  Final  Culture, blood (routine x 2)     Status: None   Collection Time: 09/26/20 11:48 PM   Specimen: BLOOD  Result Value Ref Range Status   Specimen Description BLOOD SITE NOT SPECIFIED  Final   Special Requests   Final    BOTTLES DRAWN AEROBIC AND ANAEROBIC Blood Culture adequate volume   Culture   Final     NO GROWTH 5 DAYS Performed at [facility], [address] [place], [place] [address]    Report Status 10/02/2020 FINAL  Final  SARS CORONAVIRUS 2 (TAT 6-24 HRS) Nasopharyngeal Nasopharyngeal Swab     Status: None   Collection Time: 09/27/20  3:20 AM   Specimen: Nasopharyngeal Swab  Result Value Ref Range Status   SARS Coronavirus 2 NEGATIVE NEGATIVE Final    Comment: (NOTE) SARS-CoV-2 target nucleic acids are NOT DETECTED.  The SARS-CoV-2 RNA is generally detectable in upper and lower respiratory specimens during the acute phase of infection. Negative results do not preclude SARS-CoV-2 infection, do not rule out co-infections with other pathogens, and should not be used as the sole basis for treatment or other patient management decisions. Negative results must be combined with clinical observations, patient history, and epidemiological information. The expected result is Negative.  Fact Sheet for Patients: HairSlick.no  Fact Sheet for Healthcare Providers: quierodirigir.com  This test is not yet approved or cleared by the [place] FDA and  has been authorized for detection and/or diagnosis of SARS-CoV-2 by FDA under an Emergency Use Authorization (EUA). This EUA will remain  in effect (meaning this test can be used) for  the duration of the COVID-19 declaration under Se ction 564(b)(1) of the Act, 21 U.S.C. section 360bbb-3(b)(1), unless the authorization is terminated or revoked sooner.  Performed at [facility], [address], [place], [place] [address]   Surgical pcr screen     Status: None   Collection Time: 10/03/20  8:56 AM   Specimen: Nasal Mucosa; Nasal Swab  Result Value Ref Range Status   MRSA, PCR NEGATIVE NEGATIVE Final   Staphylococcus aureus NEGATIVE NEGATIVE Final    Comment: (NOTE) The Xpert SA Assay (FDA approved for NASAL specimens in patients 65 years of age and older), is one  component of a comprehensive surveillance program. It is not intended to diagnose infection nor to guide or monitor treatment. Performed at [facility], [address] [place], [place] [address]   Aerobic/Anaerobic Culture w Gram Stain (surgical/deep wound)     Status: None (Preliminary result)   Collection Time: 10/04/20  2:28 PM   Specimen: PATH Soft tissue resection  Result Value Ref Range Status   Specimen Description WOUND  Final   Special Requests GORTEX GRAFT  Final   Gram Stain   Final    FEW WBC PRESENT, PREDOMINANTLY PMN NO ORGANISMS SEEN    Culture   Final    NO GROWTH 2 DAYS NO ANAEROBES ISOLATED; CULTURE IN PROGRESS FOR 5 DAYS Performed at [facility], [address], [place], [place] [address]    Report Status PENDING  Incomplete  Aerobic/Anaerobic Culture w Gram Stain (surgical/deep wound)     Status: None (Preliminary result)   Collection Time: 10/04/20  2:31 PM   Specimen: PATH Soft tissue  Result Value Ref Range Status   Specimen Description ABSCESS  Final   Special Requests PERIAORTIC PT ON ANCEF VANC  Final   Gram Stain   Final    MODERATE WBC PRESENT, PREDOMINANTLY PMN NO ORGANISMS SEEN    Culture   Final    NO GROWTH 2 DAYS NO ANAEROBES ISOLATED; CULTURE IN PROGRESS FOR 5 DAYS Performed at [facility], [address], [place], [place] [address]    Report Status PENDING  Incomplete  Aerobic/Anaerobic Culture w Gram Stain (surgical/deep wound)     Status: None (Preliminary result)   Collection Time: 10/04/20  2:49 PM   Specimen: PATH Soft tissue resection  Result Value Ref Range Status   Specimen Description TISSUE  Final   Special Requests AORTIC VALVE PT ON ANCEF , VANC  Final   Gram Stain   Final    MODERATE WBC PRESENT, PREDOMINANTLY PMN NO ORGANISMS SEEN    Culture   Final    NO GROWTH 2 DAYS Performed at [facility], [address] [place], [place] [address]    Report Status PENDING  Incomplete  Acid Fast Smear  (AFB)     Status: None   Collection Time: 10/04/20  2:49 PM   Specimen: PATH Soft tissue resection  Result Value Ref Range Status   AFB Specimen Processing Concentration  Final   Acid Fast Smear Negative  Final    Comment: (NOTE) Performed At: [facility] [address] [place], [place] [id number] Jolene Schimke MD JO:[id number]    Source (AFB) TISSUE  Final    Comment: AORTIC VALVE Performed at [facility], [address], [place], [place] [address]     Impression/Plan:  1. Aggregatibacter prosthetic perivalvular abscess - on ceftriaxone and tolerating this well.  No positive  cultures from the operative cultures.   Will plan on 6 weeks of post op ceftriaxone   2.  Access - picc line placed today  3.  Poor dentition - will need follow up with dentistry after discharge for ? Dental extraction.  No current significant issues but overall may benefit th extractions and dentures, particularly while on antibiotics.    Will follow up again next week

## 2020-10-06 NOTE — Progress Notes (Signed)
Nutrition Follow-up  DOCUMENTATION CODES:  Severe malnutrition in context of acute illness/injury  INTERVENTION:  Add Magic cup TID with meals, each supplement provides 290 kcal and 9 grams of protein once diet advances to at least full liquids.  Continue vitamin supplement.  NUTRITION DIAGNOSIS:  Severe Malnutrition related to acute illness (endocarditis) as evidenced by moderate muscle depletion, percent weight loss (9% weight loss within 1 month). - ongoing  GOAL:  Patient will meet greater than or equal to 90% of their needs - not meeting  MONITOR:  PO intake, Supplement acceptance, Labs  REASON FOR ASSESSMENT:  Malnutrition Screening Tool    ASSESSMENT:  55 yo male transferred from OSH for TEE to evaluate for endocarditis, aggregatobacter septicemia. PMH includes DM-2, HTN, HLD, bicuspid aortic valve disease s/p bioprosthetic valve replacement. 6/2 - initial sternotomy 6/9 - s/p TEE 6/15 - redo sternotomy 6/16 - extubated  Spoke with pt and wife briefly at bedside. Pt is looking forward to having lunch, which he has ordered right before RD visit. He is not interested in supplements or snacks of any kind at this time. Urged pt that he may need some extra calories and pt understands, but is not willing to try supplements at this time.  Chest tube OP: 280 ml/24 hrs NGT OP: 0 ml/24 hrs UOP: 3215 ml/24 hrs; 30 ml so far today  Admit wt: 86 kg Current wt: 89.2 kg  Recommend adding Magic Cup TID and continue MVI.  Medications: reviewed; Dulcolax, colace, ferrous fumerate-b12-vitamic C-folic acid, SSI, Lasix, Levemir, Reglan, Protonix, ceftriaxone per IV, LR @ 10-20 ml/hr, Kcl 10 mEq via central line  Labs: reviewed; K 3.3, CBG 86-130  Diet Order:   Diet Order             Diet clear liquid Room service appropriate? Yes; Fluid consistency: Thin  Diet effective now                  EDUCATION NEEDS:  Education needs have been addressed  Skin:  Skin Assessment:  Reviewed RN Assessment  Last BM:  10/02/20  Height:  Ht Readings from Last 1 Encounters:  09/26/20 5\' 8"  (1.727 m)   Weight:  Wt Readings from Last 1 Encounters:  10/06/20 89.2 kg   Ideal Body Weight:  70 kg  BMI:  Body mass index is 29.9 kg/m.  Estimated Nutritional Needs:  Kcal:  2400-2600 Protein:  120-140 gm Fluid:  >/= 2.4 L  10/08/20, RD, LDN Registered Dietitian I After-Hours/Weekend Pager # in [place]

## 2020-10-06 NOTE — Progress Notes (Addendum)
2 Days Post-Op Procedure(s) (LRB): REDO STERNOTOMY (N/A) REDO BENTALL PROCEDURE ON PUMP WITH A HEMASHIELD PLATINUM GRAFT AND HOMOGRAFT (N/A) TRANSESOPHAGEAL ECHOCARDIOGRAM (TEE) (N/A) APPLICATION OF CELL SAVER ENDOVEIN HARVEST OF GREATER SAPHENOUS VEIN (Right) Subjective: No complaints.  Objective: Vital signs in last 24 hours: Temp:  [96.8 F (36 C)-98.3 F (36.8 C)] 97.8 F (36.6 C) (06/17 0655) Pulse Rate:  [79-82] 80 (06/17 0700) Cardiac Rhythm: Atrial paced (06/17 0800) Resp:  [9-15] 11 (06/17 0700) BP: (95-168)/(55-70) 113/61 (06/17 0700) SpO2:  [98 %-100 %] 98 % (06/17 0700) Arterial Line BP: (96-173)/(39-59) 143/53 (06/16 1430) FiO2 (%):  [40 %-50 %] 40 % (06/16 1225) Weight:  [89.2 kg] 89.2 kg (06/17 0500)  Hemodynamic parameters for last 24 hours: PAP: (18-21)/(7-10) 20/9  Intake/Output from previous day: 06/16 0701 - 06/17 0700 In: 1750.4 [P.O.:350; I.V.:648; Blood:352.5; IV Piggyback:399.9] Out: 3495 [Urine:3215; Chest Tube:280] Intake/Output this shift: Total I/O In: 69.4 [I.V.:17.5; IV Piggyback:51.9] Out: 30 [Urine:30]  General appearance: alert and cooperative Neurologic: intact Heart: regular rate and rhythm, S1, S2 normal, no murmur Lungs: clear to auscultation bilaterally Extremities: edema mild Wound: dressings dry  Lab Results: Recent Labs    10/05/20 2027 10/06/20 0319  WBC 9.5 9.3  HGB 7.6* 7.7*  HCT 23.0* 23.0*  PLT 172 175   BMET:  Recent Labs    10/05/20 2027 10/06/20 0319  NA 137 138  K 3.8 3.3*  CL 106 104  CO2 26 28  GLUCOSE 131* 96  BUN 8 7  CREATININE 0.51* 0.54*  CALCIUM 7.7* 7.8*    PT/INR:  Recent Labs    10/04/20 2111  LABPROT 17.2*  INR 1.4*   ABG    Component Value Date/Time   PHART 7.405 10/05/2020 1408   HCO3 22.5 10/05/2020 1408   TCO2 24 10/05/2020 1408   ACIDBASEDEF 2.0 10/05/2020 1408   O2SAT 99.0 10/05/2020 1408   CBG (last 3)  Recent Labs    10/06/20 0016 10/06/20 0322  10/06/20 0654  GLUCAP 109* 86 119*   CXR: clear  Assessment/Plan: S/P Procedure(s) (LRB): REDO STERNOTOMY (N/A) REDO BENTALL PROCEDURE ON PUMP WITH A HEMASHIELD PLATINUM GRAFT AND HOMOGRAFT (N/A) TRANSESOPHAGEAL ECHOCARDIOGRAM (TEE) (N/A) APPLICATION OF CELL SAVER ENDOVEIN HARVEST OF GREATER SAPHENOUS VEIN (Right)  POD 2  Hemodynamically stable in sinus rhythm. Start low dose Lopressor.  Volume excess: -1700 cc yesterday. Wt down 7 lbs. Still 10 lbs over preop. Continue diuresis.  DM: glucose under good control. 86 early this am. Will decrease Levemir to 20 once daily.  Acute postop blood loss anema: Hgb improved to 7.7 after transfusion of 1 unit yesterday. Started on iron and continue diuresis. Followup tomorrow.  Agregatobacter endocarditis: operative cultures pending. No organisms seen. Continue Rocephin. Will have PICC line placed and then remove sleeve.  DC chest tubes. Keep foley today for further diuresis and remove tomorrow.   IS, ambulation.   LOS: 10 days    [name] 10/06/2020

## 2020-10-06 NOTE — Progress Notes (Signed)
Progress Note  Patient Name: Ronnie Ruiz Date of Encounter: 10/06/2020  [facility] Cardiologist: Gypsy Balsam, MD   Subjective   No acute events overnight. Pain well controlled, prefers tylenol to oxycodone. Planned for chest tube removal today.  Inpatient Medications    Scheduled Meds:  acetaminophen  1,000 mg Oral Q6H   Or   acetaminophen (TYLENOL) oral liquid 160 mg/5 mL  1,000 mg Per Tube Q6H   aspirin EC  325 mg Oral Daily   Or   aspirin  324 mg Per Tube Daily   atorvastatin  80 mg Oral Daily   bisacodyl  10 mg Oral Daily   Or   bisacodyl  10 mg Rectal Daily   Chlorhexidine Gluconate Cloth  6 each Topical Daily   docusate sodium  200 mg Oral Daily   ferrous fumarate-b12-vitamic C-folic acid  1 capsule Oral BID PC   furosemide  40 mg Intravenous BID   insulin aspart  0-24 Units Subcutaneous TID WC   insulin detemir  20 Units Subcutaneous Daily   mouth rinse  15 mL Mouth Rinse BID   metoCLOPramide (REGLAN) injection  10 mg Intravenous Q6H   metoprolol tartrate  12.5 mg Oral BID   pantoprazole  40 mg Oral Daily   sodium chloride flush  3 mL Intravenous Q12H   Continuous Infusions:  sodium chloride     cefTRIAXone (ROCEPHIN)  IV Stopped (10/05/20 2313)   lactated ringers Stopped (10/05/20 0724)   lactated ringers 10 mL/hr at 10/06/20 0745   potassium chloride     PRN Meds: metoprolol tartrate, morphine injection, ondansetron (ZOFRAN) IV, oxyCODONE, sodium chloride flush, traMADol   Vital Signs    Vitals:   10/06/20 0500 10/06/20 0600 10/06/20 0655 10/06/20 0700  BP: 136/67 132/66  113/61  Pulse: 80 79  80  Resp: 12 10  11   Temp:   97.8 F (36.6 C)   TempSrc:   Oral   SpO2: 100% 99%  98%  Weight: 89.2 kg     Height:        Intake/Output Summary (Last 24 hours) at 10/06/2020 0946 Last data filed at 10/06/2020 0745 Gross per 24 hour  Intake 1747.13 ml  Output 3510 ml  Net -1762.87 ml   Last 3 Weights 10/06/2020 10/05/2020 09/28/2020  Weight  (lbs) 196 lb 10.4 oz 204 lb 9.4 oz 186 lb 6.4 oz  Weight (kg) 89.2 kg 92.8 kg 84.55 kg      Telemetry    NSR - Personally Reviewed  ECG    10/05/20 sinus brady with PAC- Personally Reviewed  Physical Exam   GEN: No acute distress.   Neck: Central line in place R neck Cardiac: RRR, no murmurs, rubs, or gallops.  Respiratory: Clear to auscultation bilaterally. GI: Soft, nontender, non-distended  MS: Trivial edema; No deformity. Neuro:  Nonfocal  Psych: Normal affect   Labs    High Sensitivity Troponin:  No results for input(s): TROPONINIHS in the last 720 hours.    Chemistry Recent Labs  Lab 10/05/20 0419 10/05/20 0421 10/05/20 1408 10/05/20 2027 10/06/20 0319  NA 136   < > 141 137 138  K 3.8   < > 3.4* 3.8 3.3*  CL 104  --   --  106 104  CO2 25  --   --  26 28  GLUCOSE 148*  --   --  131* 96  BUN 8  --   --  8 7  CREATININE 0.47*  --   --  0.51* 0.54*  CALCIUM 7.2*  --   --  7.7* 7.8*  GFRNONAA >60  --   --  >60 >60  ANIONGAP 7  --   --  5 6   < > = values in this interval not displayed.     Hematology Recent Labs  Lab 10/05/20 0419 10/05/20 0421 10/05/20 1408 10/05/20 2027 10/06/20 0319  WBC 8.4  --   --  9.5 9.3  RBC 2.43*  --   --  2.55* 2.57*  HGB 7.1*   < > 6.1* 7.6* 7.7*  HCT 21.3*   < > 18.0* 23.0* 23.0*  MCV 87.7  --   --  90.2 89.5  MCH 29.2  --   --  29.8 30.0  MCHC 33.3  --   --  33.0 33.5  RDW 16.9*  --   --  16.6* 16.7*  PLT 155  --   --  172 175   < > = values in this interval not displayed.    BNPNo results for input(s): BNP, PROBNP in the last 168 hours.   DDimer No results for input(s): DDIMER in the last 168 hours.   Radiology    DG Chest Port 1 View  Result Date: 10/06/2020 CLINICAL DATA:  Chest pain.  Status postextubation EXAM: PORTABLE CHEST 1 VIEW COMPARISON:  October 05, 2020 FINDINGS: Endotracheal tube and nasogastric tube have been removed. Swan-Ganz catheter is been removed. Cordis tip is in the superior vena cava.  There is a right chest tube and a mediastinal drain. No evident pneumothorax. There is scattered atelectasis in the bases. Lungs elsewhere clear. Heart is upper normal in size with pulmonary vascularity normal. No adenopathy. There is calcification in the right carotid artery. IMPRESSION: Tube and catheter positions as described without evident pneumothorax. Atelectatic change in the base regions. Lungs otherwise clear. Stable cardiac silhouette. There is right carotid artery calcification. Electronically Signed   By: Bretta Bang III M.D.   On: 10/06/2020 08:03   DG Chest Port 1 View  Result Date: 10/05/2020 CLINICAL DATA:  Hypoxia EXAM: PORTABLE CHEST 1 VIEW COMPARISON:  October 04, 2020 FINDINGS: Endotracheal tube tip is 5.3 cm above the carina. Nasogastric tube tip and side port are below the diaphragm. Swan-Ganz catheter tip is in the main pulmonary outflow track slightly beyond the pulmonic valve. There is a chest tube on the right. Temporary pacemaker wires are attached to the right heart. There is also a mediastinal drain. No pneumothorax. There is bibasilar atelectasis. Lungs elsewhere clear. Heart is upper normal in size with pulmonary vascularity normal. No adenopathy. No bone lesions. IMPRESSION: Tube and catheter positions as described without evident pneumothorax. Bibasilar atelectasis. Heart upper normal in size. Electronically Signed   By: Bretta Bang III M.D.   On: 10/05/2020 08:00   DG Chest Port 1 View  Result Date: 10/04/2020 CLINICAL DATA:  55 year old male status post aortic valve replacement. EXAM: PORTABLE CHEST 1 VIEW COMPARISON:  Chest radiograph dated 09/21/2020 and CT dated 09/29/2020. FINDINGS: Endotracheal tube with tip approximately 4 cm above the carina. Enteric tube extending below the diaphragm with side-port in the left upper abdomen and tip beyond the inferior margin of the image. Right IJ Swan-Ganz catheter with tip likely in the main pulmonary trunk. Inferiorly  accessed mediastinal drain and right chest tube. No focal consolidation, pleural effusion pneumothorax. Mild cardiomegaly. No acute osseous pathology. Median sternotomy wires. IMPRESSION: 1. Postsurgical changes of the chest with support apparatus as described. 2. No  acute cardiopulmonary process. Electronically Signed   By: Elgie Collard M.D.   On: 10/04/2020 21:29   [place] EKG SITE RITE  Result Date: 10/06/2020 If Site Rite image not attached, placement could not be confirmed due to current cardiac rhythm.   Cardiac Studies   TEE 09/28/20  1. There is a valved conduit present in the aortic position and aortic  root. There is abnormal thickening posterior to the valved conduit  beginning at the level of the valve and extending to the sinus of  Valsalva, extending along the aorto-mitral  continuity. There is an area of echolucency posterior to the prosthetic  valve, concerning for perivalvular abscess. There is no flow into this  region to suggest a fistulous track. The left coronary artery courses into  this space, no definite  communication. . The aortic valve has been repaired/replaced. Aortic valve  regurgitation is not visualized. EOA 2.7 cm2, iEOA 1.36 cm2/m2, AT 82  msec, DVI 0.72. Normal function of aortic valve prosthesis. There is a 25  mm Magna-Ease pericardial valve  present in the aortic position. Procedure Date: 09/10/2018. Echo findings  are concerning for abscess of the aortic prosthesis. Recommend gated CTA  Heart to further evaluate findings.   2. Left ventricular ejection fraction, by estimation, is 60%. The left  ventricle has normal function.   3. Right ventricular systolic function is normal. The right ventricular  size is normal.   4. Left atrial size was mildly dilated. No left atrial/left atrial  appendage thrombus was detected.   5. The mitral valve is normal in structure. Trivial mitral valve  regurgitation.   6. S/p 28 mm Gelweave Valsalva graft (Bentall  procedure). Aorta is normal  caliber where visualized distal to the graft. . Aortic root/ascending  aorta has been repaired/replaced.   7. Evidence of atrial level shunting detected by color flow Doppler.  Agitated saline contrast bubble study was positive with shunting observed  within 3-6 cardiac cycles suggestive of interatrial shunt. There is a  small patent foramen ovale with  bidirectional shunting across atrial septum.   8. No valvular vegetations noted, however there is concern for  perivavular abscess around aortic valve prosthesis as noted in Impression  #1.   Conclusion(s)/Recommendation(s): Discussed findings with cardiology  consult service, will arrange for CTA Heart gated study.     Coronary CTA 09/29/2020 CORONARY ARTERIES: Study was not performed to evaluate coronary arteries. Nitroglycerin was not given due to hypotension.   The left main coronary artery is encircled by mixed attenuation mass consistent with abscess. No communication with coronary artery seen. LM is free of disease and is not compressed by abscess.   Left dominant circulation. Calcifications in LAD and L circumflex. Proximal LAD and proximal L circumflex do not appear to have severe stenosis. RCA is small and appears patent.   OTHER:   Mild dilation of main pulmonary artery, 30 mm.   No left atrial appendage thrombus.   Normal pulmonary vein drainage into the left atrium.   IMPRESSION: 1. Findings consistent with perivalvular abscess posterior to the aortic valve bioprosthesis. The left main coronary artery is patent and traverses this area of mixed attenuation without compression of the left main coronary artery.   Findings communicated to the cardiology consult service. Recommend cardiac surgery consult.  Patient Profile     55 y.o. male with a PMH of bicuspid aortic valve s/p bioprosthetic AVR 2020, HTN, HLD, and DM type 2, who was found to have HACEK organism [facility]acteremia  [facility], [facility]. Found to have perivalvular abscess, now s/p redo bentall/AVR.  Assessment & Plan    HACEK organism bacteremia Hx of severe AS due to bicuspid aortic valve s/p bioprosthetic AVR/Bentall in 2020 Prostethic perivalvular abscess - culture grew aggregatibacter negative [facility] NGTD on cultures here. - TEE, CTA with perivalvular abscess posterior and lateral to the AVR bioprosthesis with LMCA traversing through an area of mixed attenuation without evidence of compression of the LMCA -s/p redo sternotomy, redo bentall/AVR by Dr. Laneta SimmersBartle with 28 mm hemashield graft and 23 mm aortic homograft valve/root - ABX per ID, awaiting culture results from material recovered from surgery  Post op volume excess -agree with continued diuresis and potassium supplementation   Hypertension -has been labile post op, monitor -continue metoprolol, furosemide   Nonobstructive CAD - continue ASA, lipitor, BB    Hyperlipidemia with LDL goal < 70 - 02/28/2020: Cholesterol, Total 192; HDL 48; LDL Chol Calc (NIH) 105; Triglycerides 226 - changed zocor to 80 mg lipitor this admission - will need fasting lipids and LFTs in 6 weeks   For questions or updates, please contact [facility] Please consult [email or web address] for contact info under     Signed, Jodelle RedBridgette Xzayvier Fagin, MD  10/06/2020, 9:46 AM

## 2020-10-07 ENCOUNTER — Inpatient Hospital Stay (HOSPITAL_COMMUNITY): Payer: Managed Care, Other (non HMO)

## 2020-10-07 LAB — TYPE AND SCREEN
ABO/RH(D): B POS
Antibody Screen: NEGATIVE
Unit division: 0
Unit division: 0
Unit division: 0
Unit division: 0
Unit division: 0
Unit division: 0
Unit division: 0
Unit division: 0

## 2020-10-07 LAB — CBC
HCT: 25 % — ABNORMAL LOW (ref 39.0–52.0)
Hemoglobin: 7.9 g/dL — ABNORMAL LOW (ref 13.0–17.0)
MCH: 29.5 pg (ref 26.0–34.0)
MCHC: 31.6 g/dL (ref 30.0–36.0)
MCV: 93.3 fL (ref 80.0–100.0)
Platelets: 247 10*3/uL (ref 150–400)
RBC: 2.68 MIL/uL — ABNORMAL LOW (ref 4.22–5.81)
RDW: 16.4 % — ABNORMAL HIGH (ref 11.5–15.5)
WBC: 11.1 10*3/uL — ABNORMAL HIGH (ref 4.0–10.5)
nRBC: 0 % (ref 0.0–0.2)

## 2020-10-07 LAB — BPAM RBC
Blood Product Expiration Date: 202206282359
Blood Product Expiration Date: 202206282359
Blood Product Expiration Date: 202206282359
Blood Product Expiration Date: 202206292359
Blood Product Expiration Date: 202206292359
Blood Product Expiration Date: 202206302359
Blood Product Expiration Date: 202207012359
Blood Product Expiration Date: 202207022359
ISSUE DATE / TIME: 202206150916
ISSUE DATE / TIME: 202206150916
ISSUE DATE / TIME: 202206150916
ISSUE DATE / TIME: 202206161424
ISSUE DATE / TIME: 202206170211
Unit Type and Rh: 7300
Unit Type and Rh: 7300
Unit Type and Rh: 7300
Unit Type and Rh: 7300
Unit Type and Rh: 7300
Unit Type and Rh: 7300
Unit Type and Rh: 7300
Unit Type and Rh: 7300

## 2020-10-07 LAB — GLUCOSE, CAPILLARY
Glucose-Capillary: 115 mg/dL — ABNORMAL HIGH (ref 70–99)
Glucose-Capillary: 142 mg/dL — ABNORMAL HIGH (ref 70–99)
Glucose-Capillary: 118 mg/dL — ABNORMAL HIGH (ref 70–99)
Glucose-Capillary: 169 mg/dL — ABNORMAL HIGH (ref 70–99)
Glucose-Capillary: 142 mg/dL — ABNORMAL HIGH (ref 70–99)

## 2020-10-07 LAB — ACID FAST SMEAR (AFB, MYCOBACTERIA)
Acid Fast Smear: NEGATIVE
Acid Fast Smear: NEGATIVE

## 2020-10-07 LAB — BASIC METABOLIC PANEL
Anion gap: 5 (ref 5–15)
BUN: 7 mg/dL (ref 6–20)
CO2: 27 mmol/L (ref 22–32)
Calcium: 8.4 mg/dL — ABNORMAL LOW (ref 8.9–10.3)
Chloride: 102 mmol/L (ref 98–111)
Creatinine, Ser: 0.45 mg/dL — ABNORMAL LOW (ref 0.61–1.24)
GFR, Estimated: >60 mL/min (ref >60)
Glucose, Bld: 106 mg/dL — ABNORMAL HIGH (ref 70–99)
Potassium: 4 mmol/L (ref 3.5–5.1)
Sodium: 134 mmol/L — ABNORMAL LOW (ref 135–145)

## 2020-10-07 MED ORDER — TRAMADOL HCL 50 MG PO TABS
50.0000 mg | ORAL_TABLET | ORAL | Status: DC | PRN
  Administered 2020-10-07: 100 mg via ORAL
  Filled 2020-10-07: qty 2

## 2020-10-07 MED ORDER — FUROSEMIDE 40 MG PO TABS
40.0000 mg | ORAL_TABLET | Freq: Every day | ORAL | Status: AC
  Administered 2020-10-07 – 2020-10-08 (×2): 40 mg via ORAL
  Filled 2020-10-07 (×2): qty 1

## 2020-10-07 MED ORDER — ~~LOC~~ CARDIAC SURGERY, PATIENT & FAMILY EDUCATION: Freq: Once | Status: AC

## 2020-10-07 MED ORDER — POTASSIUM CHLORIDE CRYS ER 20 MEQ PO TBCR
20.0000 meq | EXTENDED_RELEASE_TABLET | Freq: Every day | ORAL | Status: AC
  Administered 2020-10-07 – 2020-10-08 (×2): 20 meq via ORAL
  Filled 2020-10-07 (×2): qty 1

## 2020-10-07 NOTE — Progress Notes (Addendum)
[address]       [place]nie Ru[place] [address]             [phone]        CARDIOTHORACIC SURGERY PROGRESS NOTE   R3 Days Post-Op Procedure(s) (LRB): REDO STERNOTOMY (N/A) REDO BENTALL PROCEDURE ON PUMP WITH A HEMASHIELD PLATINUM GRAFT AND HOMOGRAFT (N/A) TRANSESOPHAGEAL ECHOCARDIOGRAM (TEE) (N/A) APPLICATION OF CELL SAVER ENDOVEIN HARVEST OF GREATER SAPHENOUS VEIN (Right)  Subjective: Looks good.  Mild soreness in chest.  No SOB.  Starting to get an appetite  Objective: Vital signs: BP Readings from Last 1 Encounters:  10/07/20 114/63   Pulse Readings from Last 1 Encounters:  10/07/20 86   Resp Readings from Last 1 Encounters:  10/07/20 15   Temp Readings from Last 1 Encounters:  10/06/20 98.8 F (37.1 C) (Oral)    Hemodynamics:    Physical Exam:  Rhythm:   sinus  Breath sounds: clear  Heart sounds:  RRR  Incisions:  Dressing dry intact  Abdomen:  Soft, non-distended, non-tender  Extremities:  Warm, well-perfused   Intake/Output from previous day: 06/17 0701 - 06/18 0700 In: 550.5 [I.V.:72.4; IV Piggyback:478.1] Out: 3025 [Urine:3025] Intake/Output this shift: Total I/O In: 240 [P.O.:240] Out: 275 [Urine:275]  Lab Results:  CBC: Recent Labs    10/06/20 0319 10/07/20 0325  WBC 9.3 11.1*  HGB 7.7* 7.9*  HCT 23.0* 25.0*  PLT 175 247    BMET:  Recent Labs    10/06/20 0319 10/07/20 0325  NA 138 134*  K 3.3* 4.0  CL 104 102  CO2 28 27  GLUCOSE 96 106*  BUN 7 7  CREATININE 0.54* 0.45*  CALCIUM 7.8* 8.4*     PT/INR:   Recent Labs    10/04/20 2111  LABPROT 17.2*  INR 1.4*    CBG (last 3)  Recent Labs    10/06/20 1107 10/06/20 1603 10/07/20 0743  GLUCAP 154* 157* 115*    ABG    Component Value Date/Time   PHART 7.405 10/05/2020 1408   PCO2ART 35.8 10/05/2020 1408   PO2ART 143 (H) 10/05/2020 1408   HCO3 22.5 10/05/2020 1408   TCO2 24 10/05/2020 1408   ACIDBASEDEF 2.0 10/05/2020 1408   O2SAT  99.0 10/05/2020 1408    CXR: PORTABLE CHEST 1 VIEW   COMPARISON:  10/06/2020   FINDINGS: Interval placement of a right arm PICC line. PICC line tip is difficult to visualize but appears to be in the SVC region. Chest drains have been removed. Negative for a pneumothorax. Bandlike densities in both lungs are suggestive for atelectasis. Heart size is stable with median sternotomy wires. Hazy densities at the left lung base could be related atelectasis or small left pleural effusion.   IMPRESSION: 1. Removal of chest drains without a pneumothorax. 2. Placement of right PICC line as described. PICC line tip is difficult to evaluate. 3. Bilateral atelectasis. Hazy densities at left lung base could be a related atelectasis or small amount of pleural fluid.     Electronically Signed   By: Richarda Overlie M.D.   On: 10/07/2020 09:42  Assessment/Plan: S/P Procedure(s) (LRB): REDO STERNOTOMY (N/A) REDO BENTALL PROCEDURE ON PUMP WITH A HEMASHIELD PLATINUM GRAFT AND HOMOGRAFT (N/A) TRANSESOPHAGEAL ECHOCARDIOGRAM (TEE) (N/A) APPLICATION OF CELL SAVER ENDOVEIN HARVEST OF GREATER SAPHENOUS VEIN (Right)  Doing remarkably well POD3 Maintaining NSR w/ stable BP Breathing comfortably on room air Expected post op acute blood loss anemia, Hgb 7.9 stable  Expected post op volume excess, weight not recorded, UOP excellent  Mobilize Diuresis Watch anemia Continue IV Rocephin Transfer 4E  Purcell Nails, MD 10/07/2020 11:00 AM

## 2020-10-07 NOTE — Progress Notes (Signed)
Pt transported toE via bed and monitor.  In no distress at time of transfer.  Report given to [facility] and POC continued.  All pt belongings sent with pt.

## 2020-10-07 NOTE — Progress Notes (Signed)
TCTS BRIEF SICU PROGRESS NOTE  3 Days Post-Op  S/P Procedure(s) (LRB): REDO STERNOTOMY (N/A) REDO BENTALL PROCEDURE ON PUMP WITH A HEMASHIELD PLATINUM GRAFT AND HOMOGRAFT (N/A) TRANSESOPHAGEAL ECHOCARDIOGRAM (TEE) (N/A) APPLICATION OF CELL SAVER ENDOVEIN HARVEST OF GREATER SAPHENOUS VEIN (Right)   Stable day  Plan: Continue current plan.  Awaiting bed for transfer  Purcell Nails, MD 10/07/2020 5:29 PM

## 2020-10-07 NOTE — Progress Notes (Signed)
Pt arrived to the floor via the bed, and ambulated to the room. Pt alert and oriented , stand up weight obtained, cardiac monitoring initiated. VSS, patient oriented to staff, room and equipment. We'll continue to monitor.

## 2020-10-08 LAB — BASIC METABOLIC PANEL
Anion gap: 8 (ref 5–15)
BUN: 7 mg/dL (ref 6–20)
CO2: 26 mmol/L (ref 22–32)
Calcium: 8.6 mg/dL — ABNORMAL LOW (ref 8.9–10.3)
Chloride: 99 mmol/L (ref 98–111)
Creatinine, Ser: 0.54 mg/dL — ABNORMAL LOW (ref 0.61–1.24)
GFR, Estimated: >60 mL/min (ref >60)
Glucose, Bld: 108 mg/dL — ABNORMAL HIGH (ref 70–99)
Potassium: 3.7 mmol/L (ref 3.5–5.1)
Sodium: 133 mmol/L — ABNORMAL LOW (ref 135–145)

## 2020-10-08 LAB — GLUCOSE, CAPILLARY
Glucose-Capillary: 89 mg/dL (ref 70–99)
Glucose-Capillary: 135 mg/dL — ABNORMAL HIGH (ref 70–99)
Glucose-Capillary: 206 mg/dL — ABNORMAL HIGH (ref 70–99)
Glucose-Capillary: 198 mg/dL — ABNORMAL HIGH (ref 70–99)

## 2020-10-08 LAB — CBC
HCT: 25.1 % — ABNORMAL LOW (ref 39.0–52.0)
Hemoglobin: 8 g/dL — ABNORMAL LOW (ref 13.0–17.0)
MCH: 29.3 pg (ref 26.0–34.0)
MCHC: 31.9 g/dL (ref 30.0–36.0)
MCV: 91.9 fL (ref 80.0–100.0)
Platelets: 297 10*3/uL (ref 150–400)
RBC: 2.73 MIL/uL — ABNORMAL LOW (ref 4.22–5.81)
RDW: 16.1 % — ABNORMAL HIGH (ref 11.5–15.5)
WBC: 9.6 10*3/uL (ref 4.0–10.5)
nRBC: 0 % (ref 0.0–0.2)

## 2020-10-08 MED ORDER — POLYETHYLENE GLYCOL 3350 17 G PO PACK
17.0000 g | PACK | Freq: Every day | ORAL | Status: DC
  Administered 2020-10-08 – 2020-10-10 (×3): 17 g via ORAL
  Filled 2020-10-08 (×3): qty 1

## 2020-10-08 NOTE — Progress Notes (Addendum)
      [address]       [place] [address]             [phone]      4 Days Post-Op Procedure(s) (LRB): REDO STERNOTOMY (N/A) REDO BENTALL PROCEDURE ON PUMP WITH A HEMASHIELD PLATINUM GRAFT AND HOMOGRAFT (N/A) TRANSESOPHAGEAL ECHOCARDIOGRAM (TEE) (N/A) APPLICATION OF CELL SAVER ENDOVEIN HARVEST OF GREATER SAPHENOUS VEIN (Right) Subjective: Feels okay this morning, tired  Objective: Vital signs in last 24 hours: Temp:  [97.6 F (36.4 C)-99.7 F (37.6 C)] 97.8 F (36.6 C) (06/19 0442) Pulse Rate:  [66-86] 66 (06/19 0442) Cardiac Rhythm: Normal sinus rhythm;Other (Comment) (06/18 2155) Resp:  [10-17] 10 (06/19 0442) BP: (103-133)/(59-76) 112/75 (06/19 0442) SpO2:  [95 %-98 %] 98 % (06/19 0442) Weight:  [86.7 kg] 86.7 kg (06/19 0500)     Intake/Output from previous day: 06/18 0701 - 06/19 0700 In: 580 [P.O.:480; IV Piggyback:100] Out: 1700 [Urine:1700] Intake/Output this shift: No intake/output data recorded.  General appearance: alert, cooperative, and no distress Heart: regular rate and rhythm, S1, S2 normal, no murmur, click, rub or gallop Lungs: clear to auscultation bilaterally Abdomen: soft, non-tender; bowel sounds normal; no masses,  no organomegaly Extremities: extremities normal, atraumatic, no cyanosis or edema Wound: clean and dry  Lab Results: Recent Labs    10/07/20 0325 10/08/20 0123  WBC 11.1* 9.6  HGB 7.9* 8.0*  HCT 25.0* 25.1*  PLT 247 297   BMET:  Recent Labs    10/07/20 0325 10/08/20 0123  NA 134* 133*  K 4.0 3.7  CL 102 99  CO2 27 26  GLUCOSE 106* 108*  BUN 7 7  CREATININE 0.45* 0.54*  CALCIUM 8.4* 8.6*    PT/INR: No results for input(s): LABPROT, INR in the last 72 hours. ABG    Component Value Date/Time   PHART 7.405 10/05/2020 1408   HCO3 22.5 10/05/2020 1408   TCO2 24 10/05/2020 1408   ACIDBASEDEF 2.0 10/05/2020 1408   O2SAT 99.0 10/05/2020 1408   CBG (last 3)  Recent Labs     10/07/20 1919 10/07/20 2156 10/08/20 0605  GLUCAP 169* 142* 89    Assessment/Plan: S/P Procedure(s) (LRB): REDO STERNOTOMY (N/A) REDO BENTALL PROCEDURE ON PUMP WITH A HEMASHIELD PLATINUM GRAFT AND HOMOGRAFT (N/A) TRANSESOPHAGEAL ECHOCARDIOGRAM (TEE) (N/A) APPLICATION OF CELL SAVER ENDOVEIN HARVEST OF GREATER SAPHENOUS VEIN (Right)  CV-NSR in the 80s, BP well controlled. Continue asa/statin/BB Pulm-On room air with excellent oxygen saturation.  Renal-creatinine 0.54, electrolytes okay. Weight is still up about 2kg, continue lasix for 2 doses H and H 8.0/25.1, stable Endo- blood glucose well controlled ID- continue Rocephin for endocarditis  Plan: EPW removed today, encouraged to ambulate around the halls. Overall doing well.    LOS: 12 days    Sharlene Dory 10/08/2020   I have seen and examined the patient and agree with the assessment and plan as outlined.  Possible d/c home 1-2 days  Purcell Nails, MD 10/08/2020 12:35 PM

## 2020-10-08 NOTE — Progress Notes (Signed)
Mobility Specialist - Progress Note   10/08/20 1352  Mobility  Activity Ambulated in hall  Level of Assistance Standby assist, set-up cues, supervision of patient - no hands on  Assistive Device Front wheel walker  Distance Ambulated (ft) 790 ft  Mobility Ambulated with assistance in hallway  Mobility Response Tolerated well  Mobility performed by Mobility specialist  $Mobility charge 1 Mobility   Pt asx throughout ambulation. Pt back in bed after walk, call bell at side. VSS throughout.  Mamie Levers Mobility Specialist Mobility Specialist Phone: [phone]

## 2020-10-09 LAB — GLUCOSE, CAPILLARY
Glucose-Capillary: 117 mg/dL — ABNORMAL HIGH (ref 70–99)
Glucose-Capillary: 156 mg/dL — ABNORMAL HIGH (ref 70–99)
Glucose-Capillary: 131 mg/dL — ABNORMAL HIGH (ref 70–99)
Glucose-Capillary: 117 mg/dL — ABNORMAL HIGH (ref 70–99)

## 2020-10-09 LAB — AEROBIC/ANAEROBIC CULTURE W GRAM STAIN (SURGICAL/DEEP WOUND)
Culture: NO GROWTH
Culture: NO GROWTH

## 2020-10-09 LAB — SURGICAL PATHOLOGY

## 2020-10-09 MED ORDER — METFORMIN HCL 500 MG PO TABS
500.0000 mg | ORAL_TABLET | Freq: Two times a day (BID) | ORAL | Status: DC
  Administered 2020-10-09 – 2020-10-10 (×3): 500 mg via ORAL
  Filled 2020-10-09 (×3): qty 1

## 2020-10-09 MED ORDER — MAGNESIUM HYDROXIDE 400 MG/5ML PO SUSP
30.0000 mL | Freq: Every day | ORAL | Status: DC | PRN
  Administered 2020-10-09: 30 mL via ORAL
  Filled 2020-10-09: qty 30

## 2020-10-09 NOTE — Discharge Summary (Signed)
[address]       [place] [address]             [phone]    Physician Discharge Summary  Patient ID: WALID HAIG MRN: [id number] DOB/AGE: November 08, 1965 55 y.o.  Admit date: 09/26/2020 Discharge date: 10/10/2020  Admission Diagnoses:  Patient Active Problem List   Diagnosis Date Noted   S/P aortic valve replacement and aortoplasty 10/04/2020   Dental caries    Aortic valve abscess    Protein-calorie malnutrition, severe 09/28/2020   Endocarditis 09/26/2020   Bacteremia 09/26/2020   Fatigue 09/15/2020   Night sweats 09/15/2020   Aortic stenosis    Dyspnea    Thoracic ascending aortic aneurysm (HCC)    Secondary adhesive capsulitis of left shoulder 08/31/2020   Obesity, diabetes, and hypertension syndrome ([place]) 02/28/2020   S/P AVR 09/22/2018   Ascending aortic aneurysm (HCC) 41 mm as measured by echo 07/02/2018   Bicuspid aortic valve 06/25/2018   Critical aortic valve stenosis mean gradient 64 mmHg, 06/25/2018   GERD (gastroesophageal reflux disease) 06/24/2018   Hyperlipidemia 06/24/2018   Essential hypertension 06/24/2018   Alcohol dependency ([place]) 06/24/2018   Heart murmur 06/24/2018   Atherosclerotic heart disease 06/24/2018   Alcoholism ([place]) 06/24/2018   Diabetes mellitus type 2, controlled ([place]) 06/24/2018     Discharge Diagnoses:  Patient Active Problem List   Diagnosis Date Noted   S/P aortic valve replacement and aortoplasty 10/04/2020   Dental caries    Aortic valve abscess    Protein-calorie malnutrition, severe 09/28/2020   Endocarditis 09/26/2020   Bacteremia 09/26/2020   Fatigue 09/15/2020   Night sweats 09/15/2020   Aortic stenosis    Dyspnea    Thoracic ascending aortic aneurysm (HCC)    Secondary adhesive capsulitis of left shoulder 08/31/2020   Obesity, diabetes, and hypertension syndrome ([place]) 02/28/2020   S/P AVR 09/22/2018   Ascending aortic aneurysm (HCC) 41 mm as measured by echo 07/02/2018   Bicuspid  aortic valve 06/25/2018   Critical aortic valve stenosis mean gradient 64 mmHg, 06/25/2018   GERD (gastroesophageal reflux disease) 06/24/2018   Hyperlipidemia 06/24/2018   Essential hypertension 06/24/2018   Alcohol dependency ([place]) 06/24/2018   Heart murmur 06/24/2018   Atherosclerotic heart disease 06/24/2018   Alcoholism ([place]) 06/24/2018   Diabetes mellitus type 2, controlled ([place]) 06/24/2018     Discharged Condition: good  HPI:   55 yo man with a history of bicuspid aortic valve, ascending aortic aneurysm, Bentall procedure ([facility] in 2020), type 2 diabetes, hypertension, hyperlipidemia, ethanol abuse, and reflux. Was in his usual state of health until late May. First noted problems after having a cortisol shot in his left shoulder. CBG were elevated. Starting feeling fatigued, then night sweats. Was admitted to [facility]. Noted to have AKI, uncontrolled DM. Blood cultures positive for Aggregatibacter, started on Ceftriaxone. Transferred to [facility] on 6/7. TEE showed echolucency posterior to valve. Cardiac CT showed possible perivalvular abscess.   Currently feels well. Denies chest pain, shortness of breath, fever.   Has not seen a dentist since his Bentall.  Hospital Course:   On 10/04/2020 Mr. Ahr underwent a Redo sternotomy, replacement of a ascending aortic graft using a 20m hemashield platinum graft and redo bentall procure ruing a 230maortic homograft valve/root. He tolerated the procedure well and was transferred to the surgical ICU in stable consition. POD 1 he remained hemodynamically stable. We were working on weaning the patient to extubate. He is pacing and  underlying rhythm is sinus bradycardia. He remains on a low-dose of Neo and Epi was discontinued. He remains on Rocephin for Agregatobacter endocarditis. POD 2 we discontinued his chest tubes. He did received one unit of pRBC with an improvement in his hemoglobin. We continued his diuretic regimen for fluid overload. His  weight was decreasing accordingly.  He has continued to make excellent ongoing progress.  He has a PICC line in place for long-term 6 weeks of ceftriaxone intravenous therapy per ID recommendations.  Organism being treated is Aggregatibacter. he did have some postoperative volume overload which has resolved and appears clinically euvolemic.  Renal function has remained within normal limits.  He does have an expected acute blood loss anemia which is stable.  Most recent hemoglobin hematocrit dated 10/08/2020 are 8.0 and 25.1 respectively.  Blood sugars have been under good control using standard protocols.  Incisions are noted to be healing well without evidence of infection.  He does not have a leukocytosis.  Oxygen has been weaned and he maintains good saturations on room air.  He is tolerating gradually increasing activities using standard cardiac rehab modalities.   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 Postoperative Diagnosis:  Same     Procedure:   Redo Median Sternotomy Extracorporeal circulation 3.   Replacement of the ascending aortic graft(hemi-arch) using a 28 mm Hemashield Platinum graft under deep hypothermic circulatory arrest 4.   Redo Bentall Procedure using a 23 mm Aortic Homograft valve/root.   Anesthesia:  General Endotracheal Discharge Exam: Blood pressure 125/60, pulse 87, temperature 98.5 F (36.9 C), temperature source Oral, resp. rate 16, height _0  (1.727 m), weight 88.8 kg, SpO2 98 %.   General appearance: alert, cooperative, and no distress Heart: regular rate and rhythm Lungs: clear to auscultation bilaterally Abdomen: benign Extremities: no edema Wound: incis healing  well  Discharge Medications:  The patient has been discharged on:   1.Beta Blocker:  Yes [  y ]                              No   [   ]                              If No, reason:  2.Ace Inhibitor/ARB: Yes [   ]                                     No  [   n ]                                     If No, reason:labile BP  3.Statin:   Yes [ y  ]                  No  [   ]                  If No, reason:  4.Ecasa:  Yes  [ y  ]  No   [   ]                  If No, reason:  DG Orthopantogram  Result Date: 09/30/2020 CLINICAL DATA:  Dental caries.  No pain at this time. EXAM: ORTHOPANTOGRAM/PANORAMIC COMPARISON:  None. FINDINGS: Multiple lower teeth and several upper teeth are absent. No evidence of fracture. No focal lytic or sclerotic lesion. IMPRESSION: No acute findings. Electronically Signed   By: Marin Olp M.D.   On: 09/30/2020 14:08   CT CORONARY MORPH W/CTA COR W/SCORE W/CA W/CM &/OR WO/CM  Addendum Date: 09/29/2020   ADDENDUM REPORT: 09/29/2020 18:43 CLINICAL DATA:  Bacteremia, concern for perivalvular abscess. 55 yo male with bacteremia and fevers, with history of bicuspid aortic valve stenosis and ascending aortic aneurysm, s/p Biological Bentall Procedure using a 25 mm Edwards Magna-Ease pericardial valve and a 28 mm Gelweave Valsalva graft, as well as replacement of the ascending aorta (hemi-arch) using a 28 mm Hemashield graft on 09/10/2018 with Dr. Cyndia Bent. COMPARISON INTRAOPERATIVE TEE 09/10/2018, CTA CHEST AORTA 10/20/19, TRANSTHORACIC ECHO 04/03/20. EXAM: Cardiac ECG gated CT angiogram TECHNIQUE: The patient was scanned on a Siemens Force 093 slice scanner. A 120 kV retrospective scan was triggered in the descending thoracic aorta at 111 HU's. Gantry rotation speed was 270 msecs and collimation was .9 mm. The 3D data set was reconstructed in 5% intervals of the R-R cycle. Systolic and diastolic phases were analyzed on a dedicated work station using MPR, MIP and  VRT modes. The patient received 131mL OMNIPAQUE IOHEXOL 350 MG/ML SOLN of contrast. Nitroglycerin could not be administered due to hypotension. FINDINGS: AORTIC VALVE: There is a bioprosthesis in the aortic position, as part of a valved conduit. Posterior to the valve, there is a 3 x 3 x 3 cm area of mixed attenuation, with low density centrally and relative peripheral enhancement, consistent with perivalvular abscess. This finding was not present on CTA chest performed 10/20/19, and is not seen on immediately post-operative TEE. There is no contrast opacification of this space, and no evidence of peri-graft pseudoaneurysm or fistula. The prosthetic valve leaflets are normal with normal excursion. AORTA: The sinus of Valsalva and ascending aorta have been replaced. There is thickening and fluid density consistent with extension of above noted periprosthetic abscess. The ascending aorta graft otherwise appears stable. The ascending aorta distal to the graft is normal caliber, 33 mm at distal ascending aorta just before the common origin of common carotid arteries. CORONARY ARTERIES: Study was not performed to evaluate coronary arteries. Nitroglycerin was not given due to hypotension. The left main coronary artery is encircled by mixed attenuation mass consistent with abscess. No communication with coronary artery seen. LM is free of disease and is not compressed by abscess. Left dominant circulation. Calcifications in LAD and L circumflex. Proximal LAD and proximal L circumflex do not appear to have severe stenosis. RCA is small and appears patent. OTHER: Mild dilation of main pulmonary artery, 30 mm. No left atrial appendage thrombus. Normal pulmonary vein drainage into the left atrium. IMPRESSION: 1. Findings consistent with perivalvular abscess posterior to the aortic valve bioprosthesis. The left main coronary artery is patent and traverses this area of mixed attenuation without compression of the left main  coronary artery. Findings communicated to the cardiology consult service. Recommend cardiac surgery consult. Electronically Signed   By: Cherlynn Kaiser   On: 09/29/2020 18:43   Result Date: 09/29/2020 EXAM: OVER-READ INTERPRETATION  CT CHEST The following report is an  over-read performed by radiologist Dr. Lucrezia Europe of [facility], [place] on 09/29/2020. This over-read does not include interpretation of cardiac or coronary anatomy or pathology. The coronary calcium score/coronary CTA interpretation by the cardiologist is dictated separately. COMPARISON:  10/20/2019 FINDINGS: Vascular: Heart size normal. Fair contrast opacification of pulmonary artery branches; the exam was not optimized for detection of pulmonary emboli. Coronary calcifications. Previous AVR. Stable ascending aortic tube graft repair. Good contrast opacification of the thoracic aorta without dissection, aneurysm, or stenosis. Common origin of common carotid arteries with some eccentric partially calcified plaque, no high-grade stenosis. Left subclavian artery widely patent. Aberrant origin of the right subclavian artery which takes a retroesophageal course. Mediastinum/Nodes: No mass or adenopathy. Lungs/Pleura: No pleural effusion. No pneumothorax. Lungs are clear. Upper Abdomen: No acute findings. Musculoskeletal: Sternotomy wires. Anterior vertebral endplate spurring at multiple levels in the lower thoracic spine. No fracture or worrisome bone lesion. IMPRESSION: 1. No acute findings. 2. Stable changes of Bentall repair. 3. Coronary and Aortic Atherosclerosis (ICD10-170.0). 4. Aberrant origin of right subclavian artery, an anatomic variant. Electronically Signed: By: Lucrezia Europe M.D. On: 09/29/2020 15:14   DG Chest Port 1 View  Result Date: 10/07/2020 CLINICAL DATA:  History of a Bentall procedure. EXAM: PORTABLE CHEST 1 VIEW COMPARISON:  10/06/2020 FINDINGS: Interval placement of a right arm PICC line. PICC line tip is difficult to  visualize but appears to be in the SVC region. Chest drains have been removed. Negative for a pneumothorax. Bandlike densities in both lungs are suggestive for atelectasis. Heart size is stable with median sternotomy wires. Hazy densities at the left lung base could be related atelectasis or small left pleural effusion. IMPRESSION: 1. Removal of chest drains without a pneumothorax. 2. Placement of right PICC line as described. PICC line tip is difficult to evaluate. 3. Bilateral atelectasis. Hazy densities at left lung base could be a related atelectasis or small amount of pleural fluid. Electronically Signed   By: Markus Daft M.D.   On: 10/07/2020 09:42   DG Chest Port 1 View  Result Date: 10/06/2020 CLINICAL DATA:  Chest pain.  Status postextubation EXAM: PORTABLE CHEST 1 VIEW COMPARISON:  October 05, 2020 FINDINGS: Endotracheal tube and nasogastric tube have been removed. Swan-Ganz catheter is been removed. Cordis tip is in the superior vena cava. There is a right chest tube and a mediastinal drain. No evident pneumothorax. There is scattered atelectasis in the bases. Lungs elsewhere clear. Heart is upper normal in size with pulmonary vascularity normal. No adenopathy. There is calcification in the right carotid artery. IMPRESSION: Tube and catheter positions as described without evident pneumothorax. Atelectatic change in the base regions. Lungs otherwise clear. Stable cardiac silhouette. There is right carotid artery calcification. Electronically Signed   By: Lowella Grip III M.D.   On: 10/06/2020 08:03   DG Chest Port 1 View  Result Date: 10/05/2020 CLINICAL DATA:  Hypoxia EXAM: PORTABLE CHEST 1 VIEW COMPARISON:  October 04, 2020 FINDINGS: Endotracheal tube tip is 5.3 cm above the carina. Nasogastric tube tip and side port are below the diaphragm. Swan-Ganz catheter tip is in the main pulmonary outflow track slightly beyond the pulmonic valve. There is a chest tube on the right. Temporary pacemaker wires  are attached to the right heart. There is also a mediastinal drain. No pneumothorax. There is bibasilar atelectasis. Lungs elsewhere clear. Heart is upper normal in size with pulmonary vascularity normal. No adenopathy. No bone lesions. IMPRESSION: Tube and catheter positions as described without evident  pneumothorax. Bibasilar atelectasis. Heart upper normal in size. Electronically Signed   By: Lowella Grip III M.D.   On: 10/05/2020 08:00   DG Chest Port 1 View  Result Date: 10/04/2020 CLINICAL DATA:  55 year old male status post aortic valve replacement. EXAM: PORTABLE CHEST 1 VIEW COMPARISON:  Chest radiograph dated 09/21/2020 and CT dated 09/29/2020. FINDINGS: Endotracheal tube with tip approximately 4 cm above the carina. Enteric tube extending below the diaphragm with side-port in the left upper abdomen and tip beyond the inferior margin of the image. Right IJ Swan-Ganz catheter with tip likely in the main pulmonary trunk. Inferiorly accessed mediastinal drain and right chest tube. No focal consolidation, pleural effusion pneumothorax. Mild cardiomegaly. No acute osseous pathology. Median sternotomy wires. IMPRESSION: 1. Postsurgical changes of the chest with support apparatus as described. 2. No acute cardiopulmonary process. Electronically Signed   By: Anner Crete M.D.   On: 10/04/2020 21:29   ECHO TEE  Result Date: 09/29/2020    TRANSESOPHOGEAL ECHO REPORT   Patient Name:   ADEMOLA VERT Date of Exam: 09/28/2020 Medical Rec #:  [id number]      Height:       68.0 in Accession #:    [id number]     Weight:       186.4 lb Date of Birth:  Feb 26, 1966      BSA:          1.983 m Patient Age:    15 years       BP:           101/66 mmHg Patient Gender: M              HR:           82 bpm. Exam Location:  Inpatient Procedure: 2D Echo, Cardiac Doppler and Color Doppler                               MODIFIED REPORT: This report was modified by Cherlynn Kaiser MD on 09/29/2020 due to revision.   Indications:     Bacteremia  History:         Patient has prior history of Echocardiogram examinations.                  Biological Bentall Procedure using a 25 mm Edwards Magna-Ease                  pericardial valve and a 28 mm Gelweave Valsalva graft.                  Aortic Valve: 25 mm Magna-Ease pericardial valve is present in                  the aortic position. Procedure Date: 09/10/2018.  Sonographer:     Merrie Roof RDCS Referring Phys:  [id number] [place] Diagnosing Phys: Cherlynn Kaiser MD PROCEDURE: After discussion of the risks and benefits of a TEE, an informed consent was obtained from the patient. TEE procedure time was 34 minutes. The transesophogeal probe was passed without difficulty through the esophogus of the patient. Local oropharyngeal anesthetic was provided with Cetacaine. Sedation performed by different physician. The patient was monitored while under deep sedation. Image quality was good. The patient's vital signs; including heart rate, blood pressure, and oxygen saturation; remained stable throughout the procedure. The patient developed no complications during the procedure. IMPRESSIONS  1. There is a valved conduit  present in the aortic position and aortic root. There is abnormal thickening posterior to the valved conduit beginning at the level of the valve and extending to the sinus of Valsalva, extending along the aorto-mitral continuity. There is an area of echolucency posterior to the prosthetic valve, concerning for perivalvular abscess. There is no flow into this region to suggest a fistulous track. The left coronary artery courses into this space, no definite communication. . The aortic valve has been repaired/replaced. Aortic valve regurgitation is not visualized. EOA 2.7 cm2, iEOA 1.36 cm2/m2, AT 82 msec, DVI 0.72. Normal function of aortic valve prosthesis. There is a 25 mm Magna-Ease pericardial valve present in the aortic position. Procedure Date: 09/10/2018. Echo  findings are concerning for abscess of the aortic prosthesis. Recommend gated CTA Heart to further evaluate findings.  2. Left ventricular ejection fraction, by estimation, is 60%. The left ventricle has normal function.  3. Right ventricular systolic function is normal. The right ventricular size is normal.  4. Left atrial size was mildly dilated. No left atrial/left atrial appendage thrombus was detected.  5. The mitral valve is normal in structure. Trivial mitral valve regurgitation.  6. S/p 28 mm Gelweave Valsalva graft (Bentall procedure). Aorta is normal caliber where visualized distal to the graft. . Aortic root/ascending aorta has been repaired/replaced.  7. Evidence of atrial level shunting detected by color flow Doppler. Agitated saline contrast bubble study was positive with shunting observed within 3-6 cardiac cycles suggestive of interatrial shunt. There is a small patent foramen ovale with bidirectional shunting across atrial septum.  8. No valvular vegetations noted, however there is concern for perivavular abscess around aortic valve prosthesis as noted in Impression #1. Conclusion(s)/Recommendation(s): Discussed findings with cardiology consult service, will arrange for CTA Heart gated study. FINDINGS  Left Ventricle: Left ventricular ejection fraction, by estimation, is 60%. The left ventricle has normal function. The left ventricular internal cavity size was normal in size. Right Ventricle: The right ventricular size is normal. No increase in right ventricular wall thickness. Right ventricular systolic function is normal. Left Atrium: Left atrial size was mildly dilated. No left atrial/left atrial appendage thrombus was detected. Right Atrium: Right atrial size was normal in size. Pericardium: There is no evidence of pericardial effusion. Mitral Valve: The mitral valve is normal in structure. Trivial mitral valve regurgitation. There is no evidence of mitral valve vegetation. Tricuspid Valve: The  tricuspid valve is normal in structure. Tricuspid valve regurgitation is trivial. There is no evidence of tricuspid valve vegetation. Aortic Valve: There is a valved conduit present in the aortic position and aortic root. There is abnormal thickening posterior to the valved conduit beginning at the level of the valve and extending to the sinus of Valsalva, extending along the aorto-mitral continuity. There is an area of echolucency posterior to the prosthetic valve, concerning for perivalvular abscess. There is no flow into this region to suggest a fistulous track. The left coronary artery courses into this space, no definite  communication. The aortic valve has been repaired/replaced. Aortic valve regurgitation is not visualized. EOA 2.7 cm2, iEOA 1.36 cm2/m2, AT 82 msec, DVI 0.72. Normal function of aortic valve prosthesis. Aortic valve mean gradient measures 10.0 mmHg. Aortic valve peak gradient measures 16.1 mmHg. Aortic valve area, by VTI measures 2.74 cm. There is a 25 mm Magna-Ease pericardial valve present in the aortic position. Procedure Date: 09/10/2018. Pulmonic Valve: The pulmonic valve was normal in structure. Pulmonic valve regurgitation is trivial. There is no evidence of pulmonic valve  vegetation. Aorta: S/p 28 mm Gelweave Valsalva graft (Bentall procedure). Aorta is normal caliber where visualized distal to the graft. The aortic root/ascending aorta has been repaired/replaced. There is minimal (Grade I) atheroma plaque involving the transverse aorta. IAS/Shunts: Evidence of atrial level shunting detected by color flow Doppler. Agitated saline contrast was given intravenously to evaluate for intracardiac shunting. Agitated saline contrast bubble study was positive with shunting observed within 3-6 cardiac cycles suggestive of interatrial shunt. A small patent foramen ovale is detected with bidirectional shunting across atrial septum.  LEFT VENTRICLE PLAX 2D LVOT diam:     2.20 cm LV SV:         92  LV SV Index:   46 LVOT Area:     3.80 cm  AORTIC VALVE AV Area (Vmax):    2.86 cm AV Area (Vmean):   2.83 cm AV Area (VTI):     2.74 cm AV Vmax:           200.50 cm/s AV Vmean:          149.000 cm/s AV VTI:            0.336 m AV Peak Grad:      16.1 mmHg AV Mean Grad:      10.0 mmHg LVOT Vmax:         151.00 cm/s LVOT Vmean:        111.000 cm/s LVOT VTI:          0.242 m LVOT/AV VTI ratio: 0.72  AORTA Ao Asc diam: 3.00 cm  SHUNTS Systemic VTI:  0.24 m Systemic Diam: 2.20 cm Cherlynn Kaiser MD Electronically signed by Cherlynn Kaiser MD Signature Date/Time: 09/29/2020/5:57:21 PM    Final (Updated)    ECHO INTRAOPERATIVE TEE  Result Date: 10/05/2020  *INTRAOPERATIVE TRANSESOPHAGEAL REPORT *  Patient Name:   ASAHD CAN Date of Exam: 10/04/2020 Medical Rec #:  [id number]      Height:       68.0 in Accession #:    [id number]     Weight:       186.4 lb Date of Birth:  02/11/1966      BSA:          1.98 m Patient Age:    81 years       BP:           98/66 mmHg Patient Gender: M              HR:           74 bpm. Exam Location:  Anesthesiology Transesophogeal exam was perform intraoperatively during surgical procedure. Patient was closely monitored under general anesthesia during the entirety of examination. Indications:     infection s/p ascending aortic graft Sonographer:     Bernadene Person RDCS Performing Phys: [place] Diagnosing Phys: Annye Asa MD Complications: No known complications during this procedure. POST-OP IMPRESSIONS Limited post-CPB exam: The patient separated easily from CPB - Left Ventricle: The left ventricular function is unchanged from pre-bypass images. Contractility is normal, with no regional wall motion abnormalities. EF 64%. - Right Ventricle: The right ventricular function was low normal on separation from CPB. This normalized with time off of CPB. Overall RV function appears unchanged from pre-bypass images.. - Aorta: A graft was placed in the ascending aorta for  repair. The conduit suture lines appear intact. - Aortic Valve: A prosthetic valve is present in the Aortic position. The gradient recorded across the prosthetic valve is within  the normal range, peak gradient 9 mmHg, mean gradient 5 mmHg. There is no stenosis, insuficiency or perivalvular leak noted. - Mitral Valve: The mitral valve function appears unchanged from pre-bypass images. The mild MR on separation from CPB resolved with time off of CPB. - Tricuspid Valve: The tricuspid valve function appears unchanged from pre-bypass images. Mild TR remains. PRE-OP FINDINGS  Left Ventricle: The left ventricle has normal systolic function, with an ejection fraction of 55-60%, measured 59%. The cavity size was normal. There is no left ventricular hypertrophy. Left ventricular diastolic function was not evaluated. Right Ventricle: The right ventricle has normal systolic function. The cavity was normal. There is no increase in right ventricular wall thickness. Catheter present in the right ventricle. Left Atrium: Left atrial size was normal in size. No left atrial/left atrial appendage thrombus was detected. Left atrial appendage velocity is normal at greater than 40 cm/s. Right Atrium: Right atrial size was normal in size. Catheter present in the right atrium. Interatrial Septum: No atrial level shunt detected by color flow Doppler. Agitated Albumin contrast bubble study was negative, with no evidence of any interatrial shunt. There is no evidence of a patent foramen ovale. Pericardium: There is no evidence of pericardial effusion. Mitral Valve: The mitral valve is normal in structure. Mitral valve regurgitation is trivial by color flow Doppler. There is no evidence of mitral valve vegetation. There is no evidence of mitral stenosis, with peak gradient 1 mmHg, mean gradient 1 mmHg. Tricuspid Valve: The tricuspid valve was normal in structure. Tricuspid valve regurgitation is mild by color flow Doppler. No evidence of  tricuspid stenosis is present. There is no evidence of tricuspid valve vegetation. Aortic Valve: Pericardial tissue ([place]) Aortic valve is present. Regurgitation is trivial, at the hinge points of the leaflets, by color flow Doppler. There is no stenosis of the prosthetic aortic valve, with mean gradient 9 mmHg, peak gradient 19 mmHg. There is no evidence of aortic valve vegetation. There is a large (at least 1.85 x 1.6 cm) fluid collection posterior to the graft, which is seen behind the left coronary cusp. It most likely surrounds the valve/graft, extending up to the level of the Sinus of Valsalva. Pulmonic Valve: The pulmonic valve was normal in structure, with normal leaflet mobility. No evidence of pulmonic stenosis. Pulmonic valve regurgitation is trivial, around the PA catheter, by color flow Doppler. Aorta: The descending aorta and aortic arch are normal in size and structure. There is a prosthetic graft/conduit seen in the ascending aorta. There is a fluid collect posterior to the left coronary cusp and Sinus of Valsalva, suspicious for abscess. The  graft appears intact, as does the prosthetic aortic valve. There is no flow into the abscess, so no fistula evident. Pulmonary Artery: The pulmonary artery is of normal size. Venous: The inferior vena cava is normal in size with greater than 50% respiratory variability, suggesting right atrial pressure of 3 mmHg. +--------------+-------++ LEFT VENTRICLE        +--------------+-------++ PLAX 2D               +--------------+-------++ LVIDd:        4.07 cm +--------------+-------++ LVIDs:        2.67 cm +--------------+-------++ LV PW:        0.50 cm +--------------+-------++ LV SV:        47 ml   +--------------+-------++ LV SV Index:  22.92   +--------------+-------++                       +--------------+-------++ +-------------+------------++  AORTIC VALVE              +-------------+------------++ AV Vmax:      183.00 cm/s  +-------------+------------++ AV Vmean:    121.500 cm/s +-------------+------------++ AV VTI:      0.362 m      +-------------+------------++ AV Peak Grad:13.4 mmHg    +-------------+------------++ AV Mean Grad:7.0 mmHg     +-------------+------------++ +-------------+---------++ MITRAL VALVE           +-------------+---------++ MV Peak grad:1.2 mmHg  +-------------+---------++ MV Mean grad:1.0 mmHg  +-------------+---------++ MV Vmax:     0.55 m/s  +-------------+---------++ MV Vmean:    38.2 cm/s +-------------+---------++ MV VTI:      0.16 m    +-------------+---------++  Annye Asa MD Electronically signed by Annye Asa MD Signature Date/Time: 10/05/2020/4:38:24 PM    Final    VAS US DOPPLER PRE CABG  Result Date: 10/04/2020 PREOPERATIVE VASCULAR EVALUATION Patient Name:  DAEGEN BERROCAL  Date of Exam:   10/03/2020 Medical Rec #: [id number]       Accession #:    [id number] Date of Birth: [birth year]       Patient Gender: M Patient Age:   55Y Exam Location:  [facility] Procedure:      VAS US DOPPLER PRE CABG Referring Phys: [place] --------------------------------------------------------------------------------  Indications:      Pre-CABG. Risk Factors:     Hypertension, hyperlipidemia, Diabetes. Comparison Study: prior 09/07/18 Performing Technologist: Archie Patten RVS  Examination Guidelines: A complete evaluation includes B-mode imaging, spectral Doppler, color Doppler, and power Doppler as needed of all accessible portions of each vessel. Bilateral testing is considered an integral part of a complete examination. Limited examinations for reoccurring indications may be performed as noted.  Right Carotid Findings: +----------+--------+--------+--------+------------+--------+           PSV cm/sEDV cm/sStenosisDescribe    Comments +----------+--------+--------+--------+------------+--------+ CCA Prox  62      11               heterogenous         +----------+--------+--------+--------+------------+--------+ CCA Distal76      19              heterogenous         +----------+--------+--------+--------+------------+--------+ ICA Prox  90      29      1-39%   heterogenous         +----------+--------+--------+--------+------------+--------+ ICA Distal85      23                                   +----------+--------+--------+--------+------------+--------+ ECA       75                                           +----------+--------+--------+--------+------------+--------+ Portions of this table do not appear on this page. +----------+--------+-------+--------+------------+           PSV cm/sEDV cmsDescribeArm Pressure +----------+--------+-------+--------+------------+ Subclavian81                                  +----------+--------+-------+--------+------------+ +---------+--------+--+--------+-+---------+ VertebralPSV cm/s29EDV cm/s8Antegrade +---------+--------+--+--------+-+---------+ Left Carotid Findings: +----------+--------+--------+--------+------------+--------+           PSV cm/sEDV cm/sStenosisDescribe    Comments +----------+--------+--------+--------+------------+--------+ CCA Prox  75      11              heterogenous         +----------+--------+--------+--------+------------+--------+ CCA Distal70      19              heterogenous         +----------+--------+--------+--------+------------+--------+ ICA Prox  64      22      1-39%   heterogenous         +----------+--------+--------+--------+------------+--------+ ICA Distal81      23                                   +----------+--------+--------+--------+------------+--------+ ECA       78                                           +----------+--------+--------+--------+------------+--------+ +----------+--------+--------+--------+------------+ SubclavianPSV cm/sEDV  cm/sDescribeArm Pressure +----------+--------+--------+--------+------------+           102                                  +----------+--------+--------+--------+------------+ +---------+--------+--+--------+-+---------+ VertebralPSV cm/s30EDV cm/s9Antegrade +---------+--------+--+--------+-+---------+  ABI Findings: +--------+------------------+-----+---------+--------+ Right   Rt Pressure (mmHg)IndexWaveform Comment  +--------+------------------+-----+---------+--------+ Brachial                       triphasic         +--------+------------------+-----+---------+--------+ +--------+------------------+-----+---------+-------+ Left    Lt Pressure (mmHg)IndexWaveform Comment +--------+------------------+-----+---------+-------+ Brachial                       triphasic        +--------+------------------+-----+---------+-------+  Right Doppler Findings: +--------+--------+-----+---------+--------+ Site    PressureIndexDoppler  Comments +--------+--------+-----+---------+--------+ Brachial             triphasic         +--------+--------+-----+---------+--------+ Radial               triphasic         +--------+--------+-----+---------+--------+ Ulnar                triphasic         +--------+--------+-----+---------+--------+  Left Doppler Findings: +--------+--------+-----+---------+--------+ Site    PressureIndexDoppler  Comments +--------+--------+-----+---------+--------+ Brachial             triphasic         +--------+--------+-----+---------+--------+ Radial               triphasic         +--------+--------+-----+---------+--------+ Ulnar                triphasic         +--------+--------+-----+---------+--------+  Summary: Right Carotid: Velocities in the right ICA are consistent with a 1-39% stenosis. Left Carotid: Velocities in the left ICA are consistent with a 1-39% stenosis. Vertebrals: Bilateral vertebral  arteries demonstrate antegrade flow. Right Upper Extremity: Doppler waveform obliterate with right radial compression. Doppler waveforms remain within normal limits with right ulnar compression. Left Upper Extremity: Doppler waveforms remain within normal limits with left radial compression. Doppler waveform obliterate with left ulnar compression.  Electronically signed by Ruta Hinds MD on 10/04/2020 at 8:46:04 AM.    Final    [place] EKG SITE  RITE  Result Date: 10/06/2020 If Site Rite image not attached, placement could not be confirmed due to current cardiac rhythm.    Discharge Instructions     [facility] pharmacist to adjust dose for Vancomycin, Aminoglycosides and other anti-infective therapies as requested by physician.   Complete by: As directed    [facility] to provide Cath Flo 42m   Complete by: As directed    Administer for PICC line occlusion and as ordered by physician for other access device issues.   Anaphylaxis Kit: Provided to treat any anaphylactic reaction to the medication being provided to the patient if First Dose or when requested by physician   Complete by: As directed    Epinephrine 1340mml vial / amp: Administer 0.40m15m0.40ml28mubcutaneously once for moderate to severe anaphylaxis, nurse to call physician and pharmacy when reaction occurs and call 911 if needed for immediate care   Diphenhydramine 50mg6mIV vial: Administer 25-50mg 21mM PRN for first dose reaction, rash, itching, mild reaction, nurse to call physician and pharmacy when reaction occurs   Sodium Chloride 0.9% NS 500ml I56mdminister if needed for hypovolemic blood pressure drop or as ordered by physician after call to physician with anaphylactic reaction   Change dressing on IV access line weekly and PRN   Complete by: As directed    Discharge patient   Complete by: As directed    [facility]   Discharge disposition: 06-Home-Health Care  Svc   Discharge patient date: 10/10/2020   Flush IV access with Sodium Chloride 0.9% and Heparin 10 units/ml or 100 units/ml   Complete by: As directed    Home infusion instructions - [facility]   Complete by: As directed    Instructions: Flush IV access with Sodium Chloride 0.9% and Heparin 10units/ml or 100units/ml   Change dressing on IV access line: Weekly and PRN   Instructions Cath Flo 2mg: Ad67mister for PICC Line occlusion and as ordered by physician for other access device   [facility] pharmacist to adjust dose for: Vancomycin, Aminoglycosides and other anti-infective therapies as requested by physician   Method of administration may be changed at the discretion of home infusion pharmacist based upon assessment of the patient and/or caregiver's ability to self-administer the medication ordered   Complete by: As directed       Allergies as of 10/10/2020   No Known Allergies      Medication List     STOP taking these medications    lisinopril 10 MG tablet Commonly known as: ZESTRIL   metoprolol succinate 50 MG 24 hr tablet Commonly known as: TOPROL-XL   simvastatin 40 MG tablet Commonly known as: ZOCOR       TAKE these medications    aspirin 325 MG EC tablet Take 1 tablet (325 mg total) by mouth daily.   atorvastatin 80 MG tablet Commonly known as: LIPITOR Take 1 tablet (80 mg total) by mouth daily.   cefTRIAXone  IVPB Commonly known as: ROCEPHIN Inject 2 g into the vein daily. Indication:  Prosthetic valve endocarditis  First Dose: Yes Last Day of Therapy:  11/15/20 Labs - Once weekly:  CBC/D and BMP, Labs - Every other week:  ESR and CRP Method of administration: IV Push Method of administration may be changed at the discretion of home infusion pharmacist based upon assessment of the patient and/or caregiver's ability to self-administer the medication ordered.   docusate sodium 250  MG capsule Commonly known as: COLACE Take 250 mg  by mouth daily as needed for constipation.   esomeprazole 20 MG capsule Commonly known as: NEXIUM Take 20 mg by mouth every evening.   ferrous [id number] C-folic acid capsule Commonly known as: TRINSICON / FOLTRIN Take 1 capsule by mouth 2 (two) times daily after a meal.   Fish Oil 1000 MG Caps Take 1,000 mg by mouth 2 (two) times daily.   metFORMIN 500 MG tablet Commonly known as: GLUCOPHAGE Take 500 mg by mouth 2 (two) times daily with a meal.   metoprolol tartrate 25 MG tablet Commonly known as: LOPRESSOR Take 0.5 tablets (12.5 mg total) by mouth 2 (two) times daily.   traMADol 50 MG tablet Commonly known as: ULTRAM Take 1 tablet (50 mg total) by mouth every 6 (six) hours as needed for moderate pain.               Discharge Care Instructions  (From admission, onward)           Start     Ordered   10/10/20 0000  Change dressing on IV access line weekly and PRN  (Home infusion instructions - [facility] )        10/10/20 0854            Follow-up Information     Gaye Pollack, MD Follow up.   Specialty: Cardiothoracic Surgery Why: Please see discharge paperwork for follow-up appointment with surgeon.  Obtain a chest x-ray at [facility] 1/2-hour prior to the appointment.  You will also have an appointment arranged for suture removal. Contact information: [address] [place] [address] [phone]         Park Liter, MD Follow up.   Specialty: Cardiology Why: Please see discharge paperwork for follow-up appointment with cardiology.  Please recheck which office the appointment has been arranged for. Contact information: [place] [address] [phone]         [facility] Follow up.   Why: home IV abx infusion have been arranged- coordinating with [facility]: _0 @ 10/10/2020, 9:14 AM

## 2020-10-09 NOTE — Progress Notes (Signed)
[address]       [place] [address]             [phone]      5 Days Post-Op Procedure(s) (LRB): REDO STERNOTOMY (N/A) REDO BENTALL PROCEDURE ON PUMP WITH A HEMASHIELD PLATINUM GRAFT AND HOMOGRAFT (N/A) TRANSESOPHAGEAL ECHOCARDIOGRAM (TEE) (N/A) APPLICATION OF CELL SAVER ENDOVEIN HARVEST OF GREATER SAPHENOUS VEIN (Right) Subjective: Only c/o is no BM. Minor discomforts  Objective: Vital signs in last 24 hours: Temp:  [98 F (36.7 C)-98.7 F (37.1 C)] 98.6 F (37 C) (06/20 0341) Pulse Rate:  [74-85] 81 (06/20 0341) Cardiac Rhythm: Normal sinus rhythm (06/19 1900) Resp:  [13-14] 14 (06/20 0341) BP: (105-133)/(63-83) 107/68 (06/20 0341) SpO2:  [96 %-98 %] 97 % (06/20 0341) Weight:  [87.9 kg] 87.9 kg (06/20 0341)  Hemodynamic parameters for last 24 hours:    Intake/Output from previous day: 06/19 0701 - 06/20 0700 In: -  Out: 100 [Urine:100] Intake/Output this shift: No intake/output data recorded.  General appearance: alert, cooperative, and no distress Heart: regular rate and rhythm and no murmur Lungs: mildly dim in bases Abdomen: minimal tenderness, non distended Extremities: no edema Wound: incis healing well  Lab Results: Recent Labs    10/07/20 0325 10/08/20 0123  WBC 11.1* 9.6  HGB 7.9* 8.0*  HCT 25.0* 25.1*  PLT 247 297   BMET:  Recent Labs    10/07/20 0325 10/08/20 0123  NA 134* 133*  K 4.0 3.7  CL 102 99  CO2 27 26  GLUCOSE 106* 108*  BUN 7 7  CREATININE 0.45* 0.54*  CALCIUM 8.4* 8.6*    PT/INR: No results for input(s): LABPROT, INR in the last 72 hours. ABG    Component Value Date/Time   PHART 7.405 10/05/2020 1408   HCO3 22.5 10/05/2020 1408   TCO2 24 10/05/2020 1408   ACIDBASEDEF 2.0 10/05/2020 1408   O2SAT 99.0 10/05/2020 1408   CBG (last 3)  Recent Labs    10/08/20 1611 10/08/20 2029 10/09/20 0609  GLUCAP 206* 198* 117*    Meds Scheduled Meds:  acetaminophen  1,000 mg Oral Q6H    aspirin EC  325 mg Oral Daily   atorvastatin  80 mg Oral Daily   bisacodyl  10 mg Oral Daily   Or   bisacodyl  10 mg Rectal Daily   Chlorhexidine Gluconate Cloth  6 each Topical Daily   docusate sodium  200 mg Oral Daily   ferrous fumarate-b12-vitamic C-folic acid  1 capsule Oral BID PC   insulin aspart  0-24 Units Subcutaneous TID WC   insulin detemir  20 Units Subcutaneous Daily   mouth rinse  15 mL Mouth Rinse BID   metoprolol tartrate  12.5 mg Oral BID   pantoprazole  40 mg Oral Daily   polyethylene glycol  17 g Oral Daily   sodium chloride flush  10-40 mL Intracatheter Q12H   sodium chloride flush  3 mL Intravenous Q12H   Continuous Infusions:  sodium chloride Stopped (10/06/20 1521)   cefTRIAXone (ROCEPHIN)  IV 2 g (10/08/20 2221)   lactated ringers Stopped (10/05/20 0724)   lactated ringers Stopped (10/06/20 1238)   PRN Meds:.sodium chloride, metoprolol tartrate, ondansetron (ZOFRAN) IV, sodium chloride flush, sodium chloride flush, traMADol  Xrays No results found.   Results for orders placed or performed during the hospital encounter of 09/26/20  Culture, blood (routine x 2)     Status: None   Collection Time:  09/26/20 11:48 PM   Specimen: BLOOD  Result Value Ref Range Status   Specimen Description BLOOD SITE NOT SPECIFIED  Final   Special Requests   Final    BOTTLES DRAWN AEROBIC AND ANAEROBIC Blood Culture results may not be optimal due to an excessive volume of blood received in culture bottles   Culture   Final    NO GROWTH 5 DAYS Performed at [facility], [address] [place], [place] [address]    Report Status 10/02/2020 FINAL  Final  Culture, blood (routine x 2)     Status: None   Collection Time: 09/26/20 11:48 PM   Specimen: BLOOD  Result Value Ref Range Status   Specimen Description BLOOD SITE NOT SPECIFIED  Final   Special Requests   Final    BOTTLES DRAWN AEROBIC AND ANAEROBIC Blood Culture adequate volume   Culture   Final    NO  GROWTH 5 DAYS Performed at [facility], [address] [place], [place] [address]    Report Status 10/02/2020 FINAL  Final  SARS CORONAVIRUS 2 (TAT 6-24 HRS) Nasopharyngeal Nasopharyngeal Swab     Status: None   Collection Time: 09/27/20  3:20 AM   Specimen: Nasopharyngeal Swab  Result Value Ref Range Status   SARS Coronavirus 2 NEGATIVE NEGATIVE Final    Comment: (NOTE) SARS-CoV-2 target nucleic acids are NOT DETECTED.  The SARS-CoV-2 RNA is generally detectable in upper and lower respiratory specimens during the acute phase of infection. Negative results do not preclude SARS-CoV-2 infection, do not rule out co-infections with other pathogens, and should not be used as the sole basis for treatment or other patient management decisions. Negative results must be combined with clinical observations, patient history, and epidemiological information. The expected result is Negative.  Fact Sheet for Patients: HairSlick.no  Fact Sheet for Healthcare Providers: quierodirigir.com  This test is not yet approved or cleared by the [place] FDA and  has been authorized for detection and/or diagnosis of SARS-CoV-2 by FDA under an Emergency Use Authorization (EUA). This EUA will remain  in effect (meaning this test can be used) for the duration of the COVID-19 declaration under Se ction 564(b)(1) of the Act, 21 U.S.C. section 360bbb-3(b)(1), unless the authorization is terminated or revoked sooner.  Performed at [facility], [address] [place], [place] [address]   Surgical pcr screen     Status: None   Collection Time: 10/03/20  8:56 AM   Specimen: Nasal Mucosa; Nasal Swab  Result Value Ref Range Status   MRSA, PCR NEGATIVE NEGATIVE Final   Staphylococcus aureus NEGATIVE NEGATIVE Final    Comment: (NOTE) The Xpert SA Assay (FDA approved for NASAL specimens in patients 49 years of age and older), is one  component of a comprehensive surveillance program. It is not intended to diagnose infection nor to guide or monitor treatment. Performed at [facility], [address] [place], [place] [address]   Aerobic/Anaerobic Culture w Gram Stain (surgical/deep wound)     Status: None (Preliminary result)   Collection Time: 10/04/20  2:28 PM   Specimen: PATH Soft tissue resection  Result Value Ref Range Status   Specimen Description WOUND  Final   Special Requests GORTEX GRAFT  Final   Gram Stain   Final    FEW WBC PRESENT, PREDOMINANTLY PMN NO ORGANISMS SEEN    Culture   Final    NO GROWTH 4 DAYS NO ANAEROBES ISOLATED; CULTURE IN  PROGRESS FOR 5 DAYS Performed at [facility], [address] [place], [place] [address]    Report Status PENDING  Incomplete  Acid Fast Smear (AFB)     Status: None   Collection Time: 10/04/20  2:28 PM   Specimen: PATH Soft tissue resection  Result Value Ref Range Status   AFB Specimen Processing Comment  Final    Comment: Tissue Grinding and Digestion/Decontamination   Acid Fast Smear Negative  Final    Comment: (NOTE) Performed At: [facility] [address] [place], [place] [id number] Jolene Schimke MD OF:[id number]    Source (AFB) WOUND  Final    Comment: GORTEX GRAFT Performed at [facility], [address], [place], [place] [address]   Aerobic/Anaerobic Culture w Gram Stain (surgical/deep wound)     Status: None (Preliminary result)   Collection Time: 10/04/20  2:31 PM   Specimen: PATH Soft tissue  Result Value Ref Range Status   Specimen Description ABSCESS  Final   Special Requests PERIAORTIC PT ON ANCEF VANC  Final   Gram Stain   Final    MODERATE WBC PRESENT, PREDOMINANTLY PMN NO ORGANISMS SEEN    Culture   Final    NO GROWTH 4 DAYS NO ANAEROBES ISOLATED; CULTURE IN PROGRESS FOR 5 DAYS Performed at [facility], [address], [place], [place] [address]    Report Status PENDING  Incomplete  Acid Fast  Smear (AFB)     Status: None   Collection Time: 10/04/20  2:31 PM   Specimen: PATH Soft tissue  Result Value Ref Range Status   AFB Specimen Processing Concentration  Final   Acid Fast Smear Negative  Final    Comment: (NOTE) Performed At: [facility] [address] [place], [place] [id number] Jolene Schimke MD QP:[id number]    Source (AFB) ABSCESS  Final    Comment: PERIAORTIC Performed at [facility], [address] [place], [place] [address]   Aerobic/Anaerobic Culture w Gram Stain (surgical/deep wound)     Status: None (Preliminary result)   Collection Time: 10/04/20  2:49 PM   Specimen: PATH Soft tissue resection  Result Value Ref Range Status   Specimen Description TISSUE  Final   Special Requests AORTIC VALVE PT ON ANCEF , VANC  Final   Gram Stain   Final    MODERATE WBC PRESENT, PREDOMINANTLY PMN NO ORGANISMS SEEN    Culture   Final    NO GROWTH 4 DAYS NO ANAEROBES ISOLATED; CULTURE IN PROGRESS FOR 5 DAYS Performed at [facility], [address], [place], [place] [address]    Report Status PENDING  Incomplete  Acid Fast Smear (AFB)     Status: None   Collection Time: 10/04/20  2:49 PM   Specimen: PATH Soft tissue resection  Result Value Ref Range Status   AFB Specimen Processing Concentration  Final   Acid Fast Smear Negative  Final    Comment: (NOTE) Performed At: [facility] [address] [place], [place] [id number] Jolene Schimke MD QB:[id number]    Source (AFB) TISSUE  Final    Comment: AORTIC VALVE Performed at [facility], [address], [place], [place] [address]     Assessment/Plan: S/P Procedure(s) (LRB): REDO STERNOTOMY (N/A) REDO BENTALL PROCEDURE ON PUMP WITH A HEMASHIELD PLATINUM GRAFT AND HOMOGRAFT (N/A) TRANSESOPHAGEAL ECHOCARDIOGRAM (TEE) (N/A) APPLICATION OF CELL SAVER ENDOVEIN HARVEST OF GREATER SAPHENOUS VEIN (Right)  1 afeb, VSS 2  sats good on RA 3 no new labs, very stale  yesterday 4 BS control 89-206 range, Hg A1c 8.7 preop, will restart metformin, will need f/u with primary 5 will put OPAT orders in and [facility] 6 add milk of magnesia 7 poss home soon if arrangements in place    LOS: 13 days    Rowe ClackWayne E Earnie Rockhold PA-C Pager [phone] 10/09/2020

## 2020-10-09 NOTE — Progress Notes (Signed)
CARDIAC REHAB PHASE I   PRE:  Rate/Rhythm: 83 SR  BP:  Sitting: 117/83      SaO2: 100 RA  MODE:  Ambulation: 750 ft   POST:  Rate/Rhythm: 93 SR  BP:  Sitting: 130/71    SaO2: 94 RA   Pt ambulated 79ft in hallway standby assist with front wheel walker. Pt denies CP, SOB, or dizziness. Pt returned to bed. Encouraged continued ambulation and IS use. Pt denies questions or concerns at this time. Will continue to follow.  [id number] Reynold Bowen, RN BSN 10/09/2020 11:39 AM

## 2020-10-09 NOTE — Progress Notes (Signed)
[place] for Infectious Disease   Reason for visit: Follow up on Aggregatibacter perivalvular abscess  Interval History: no acute events; WBC remains wnl, remains afebrile.  No associated rash or diarrhea  Physical Exam: Constitutional:  Vitals:   10/09/20 0341 10/09/20 0803  BP: 107/68 112/74  Pulse: 81 77  Resp: 14 16  Temp: 98.6 F (37 C) 98.2 F (36.8 C)  SpO2: 97% 99%   patient appears in NAD Respiratory: Normal respiratory effort; CTA B Cardiovascular: RRR GI: soft, nt, nd  Review of Systems: Constitutional: negative for fevers and chills Gastrointestinal: negative for nausea and diarrhea  Lab Results  Component Value Date   WBC 9.6 10/08/2020   HGB 8.0 (L) 10/08/2020   HCT 25.1 (L) 10/08/2020   MCV 91.9 10/08/2020   PLT 297 10/08/2020    Lab Results  Component Value Date   CREATININE 0.54 (L) 10/08/2020   BUN 7 10/08/2020   NA 133 (L) 10/08/2020   K 3.7 10/08/2020   CL 99 10/08/2020   CO2 26 10/08/2020    Lab Results  Component Value Date   ALT 25 09/26/2020   AST 24 09/26/2020   ALKPHOS 60 09/26/2020     Microbiology: Recent Results (from the past 240 hour(s))  Surgical pcr screen     Status: None   Collection Time: 10/03/20  8:56 AM   Specimen: Nasal Mucosa; Nasal Swab  Result Value Ref Range Status   MRSA, PCR NEGATIVE NEGATIVE Final   Staphylococcus aureus NEGATIVE NEGATIVE Final    Comment: (NOTE) The Xpert SA Assay (FDA approved for NASAL specimens in patients 53 years of age and older), is one component of a comprehensive surveillance program. It is not intended to diagnose infection nor to guide or monitor treatment. Performed at [facility], [place] [address], [place], [place] [address]   Aerobic/Anaerobic Culture w Gram Stain (surgical/deep wound)     Status: None (Preliminary result)   Collection Time: 10/04/20  2:28 PM   Specimen: PATH Soft tissue resection  Result Value Ref Range Status   Specimen Description  WOUND  Final   Special Requests GORTEX GRAFT  Final   Gram Stain   Final    FEW WBC PRESENT, PREDOMINANTLY PMN NO ORGANISMS SEEN    Culture   Final    NO GROWTH 4 DAYS NO ANAEROBES ISOLATED; CULTURE IN PROGRESS FOR 5 DAYS Performed at [facility], [address] [place], [place] [address]    Report Status PENDING  Incomplete  Acid Fast Smear (AFB)     Status: None   Collection Time: 10/04/20  2:28 PM   Specimen: PATH Soft tissue resection  Result Value Ref Range Status   AFB Specimen Processing Comment  Final    Comment: Tissue Grinding and Digestion/Decontamination   Acid Fast Smear Negative  Final    Comment: (NOTE) Performed At: [facility] [place], [place] [id number] Rush Farmer MD YV:[id number]    Source (AFB) WOUND  Final    Comment: GORTEX GRAFT Performed at [facility], [place] [address] [place],  [address]   Aerobic/Anaerobic Culture w Gram Stain (surgical/deep wound)     Status: None (Preliminary result)   Collection Time: 10/04/20  2:31 PM   Specimen: PATH Soft tissue  Result Value Ref Range Status   Specimen Description ABSCESS  Final   Special Requests PERIAORTIC PT ON ANCEF VANC  Final   Gram Stain   Final  MODERATE WBC PRESENT, PREDOMINANTLY PMN NO ORGANISMS SEEN    Culture   Final    NO GROWTH 4 DAYS NO ANAEROBES ISOLATED; CULTURE IN PROGRESS FOR 5 DAYS Performed at [place] [address], [place], [place] [address]    Report Status PENDING  Incomplete  Acid Fast Smear (AFB)     Status: None   Collection Time: 10/04/20  2:31 PM   Specimen: PATH Soft tissue  Result Value Ref Range Status   AFB Specimen Processing Concentration  Final   Acid Fast Smear Negative  Final    Comment: (NOTE) Performed At: [facility] [place], [place] [id number] Rush Farmer MD NI:[id number]    Source (AFB) ABSCESS  Final    Comment: PERIAORTIC Performed at [facility],  [place] [address] [place], [place] [address]   Aerobic/Anaerobic Culture w Gram Stain (surgical/deep wound)     Status: None (Preliminary result)   Collection Time: 10/04/20  2:49 PM   Specimen: PATH Soft tissue resection  Result Value Ref Range Status   Specimen Description TISSUE  Final   Special Requests AORTIC VALVE PT ON ANCEF , VANC  Final   Gram Stain   Final    MODERATE WBC PRESENT, PREDOMINANTLY PMN NO ORGANISMS SEEN    Culture   Final    NO GROWTH 4 DAYS NO ANAEROBES ISOLATED; CULTURE IN PROGRESS FOR 5 DAYS Performed at [facility], [address], [place], [place] [address]    Report Status PENDING  Incomplete  Acid Fast Smear (AFB)     Status: None   Collection Time: 10/04/20  2:49 PM   Specimen: PATH Soft tissue resection  Result Value Ref Range Status   AFB Specimen Processing Concentration  Final   Acid Fast Smear Negative  Final    Comment: (NOTE) Performed At: [facility] [place], [place] [id number] Rush Farmer MD FB:[id number]    Source (AFB) TISSUE  Final    Comment: AORTIC VALVE Performed at [facility], [place] [address], [place], [place] [address]     Impression/Plan:  1. Prosthetic valve endocarditis/abscess s/p valve redo - feeling well, off oxygen and no new concerns.  Tolerating ceftriaxone. Plan as outlined, ceftriaxone 6 weeks if he continues to do well.   Follow up arranged with me OPAT in and [facility] for antibiotics being arranged   Diagnosis: Prosthetic valve endocarditis/perivalvular abscess  Culture Result: Aggregatibacter  No Known Allergies  OPAT Orders Discharge antibiotics to be given via PICC line Discharge antibiotics: ceftriaxone 2 grams every 24 hours IV Per pharmacy protocol yes Duration: 6 weeks End Date: 11/15/20  [facility] Care Per Protocol: yes  Home health RN for IV administration and teaching; PICC line care and labs.    Labs weekly while on IV antibiotics: _x_ CBC with differential __  BMP _x_ CMP __ CRP __ ESR __ Vancomycin trough __ CK  _x_ Please pull PIC at completion of IV antibiotics __ Please leave PIC in place until doctor has seen patient or been notified  Fax weekly labs to [phone]  Clinic Follow Up Appt: 10/31/20  @ 1:45 pm via Video visit with Dr. Linus Salmons

## 2020-10-09 NOTE — Progress Notes (Signed)
PHARMACY CONSULT NOTE FOR:  OUTPATIENT  PARENTERAL ANTIBIOTIC THERAPY (OPAT)  Indication: Prosthetic valve endocarditis  Regimen: Ceftriaxone 2 gm IV Q 24 hours  End date: 11/15/20  IV antibiotic discharge orders are pended. To discharging provider:  please sign these orders via discharge navigator,  Select New Orders & click on the button choice - Manage This Unsigned Work.     Thank you for allowing pharmacy to be a part of this patient's care.  Sharin Mons, PharmD, BCPS, BCIDP Infectious Diseases Clinical Pharmacist Phone: [phone] 10/09/2020, 8:51 AM

## 2020-10-09 NOTE — Progress Notes (Signed)
Mobility Specialist - Progress Note   10/09/20 1719  Mobility  Activity Ambulated to bathroom  Level of Assistance Standby assist, set-up cues, supervision of patient - no hands on  Assistive Device Front wheel walker  Distance Ambulated (ft) 30 ft  Mobility Out of bed for toileting  Mobility Response Tolerated well  Mobility performed by Mobility specialist  $Mobility charge 1 Mobility   Pt requesting to go to bathroom. Pt then went into bed in order to eat dinner. VSS throughout.   Ronnie Ruiz Mobility Specialist Mobility Specialist Phone: [phone]

## 2020-10-09 NOTE — Progress Notes (Signed)
Mobility Specialist - Progress Note   10/09/20 1541  Mobility  Activity Ambulated in hall  Level of Assistance Standby assist, set-up cues, supervision of patient - no hands on  Assistive Device Front wheel walker  Distance Ambulated (ft) 750 ft  Mobility Ambulated with assistance in hallway  Mobility Response Tolerated well  Mobility performed by Mobility specialist  $Mobility charge 1 Mobility   Pt asx throughout ambulation. Pt to recliner after walk, call bell at side. VSS throughout.  Ronnie Ruiz Mobility Specialist Mobility Specialist Phone: [phone]

## 2020-10-09 NOTE — TOC Initial Note (Signed)
Transition of Care (TOC) - Initial/Assessment Note  Donn Pierini RN, BSN Transitions of Care Unit 4E- RN Case Manager See Treatment Team for direct phone #    Patient Details  Name: Ronnie Ruiz MRN: [id number] Date of Birth: Nov 30, 1965  Transition of Care [facility]) CM/SW Contact:    Darrold Span, RN Phone Number: 10/09/2020, 12:28 PM  Clinical Narrative:                 Noted pt will need LT IV abx for home (6wks), Pam with [facility] has been following pt for home IV abx needs since 6/10. Orders have been placed for [facility] and OPAT needs.  Spoke with Pam this am to confirm [facility] arrangements- she will come to see pt later today for bedside education on home IV abx, and coordinate with [facility] nursing for home IV PICC line care and labs with a plan for start of care visit on 6/22.     Expected Discharge Plan: Home w Home Health Services Barriers to Discharge: No Barriers Identified   Patient Goals and CMS Choice Patient states their goals for this hospitalization and ongoing recovery are:: return home   Choice offered to / list presented to : NA  Expected Discharge Plan and Services Expected Discharge Plan: Home w Home Health Services   Discharge Planning Services: CM Consult Post Acute Care Choice: Home Health Living arrangements for the past 2 months: Single Family Home                 DME Arranged: N/A DME Agency: NA       HH Arranged: RN, IV Antibiotics HH Agency: Other - See comment, Environmental consultant Nursing) Date HH Agency Contacted: 10/09/20 Time HH Agency Contacted: 1000 Representative spoke with at [facility] Agency: Pam  Prior Living Arrangements/Services Living arrangements for the past 2 months: Single Family Home Lives with:: Spouse Patient language and need for interpreter reviewed:: Yes Do you feel safe going back to the place where you live?: Yes      Need for Family Participation in Patient Care: Yes (Comment) Care giver support system in  place?: Yes (comment)   Criminal Activity/Legal Involvement Pertinent to Current Situation/Hospitalization: No - Comment as needed  Activities of Daily Living Home Assistive Devices/Equipment: None ADL Screening (condition at time of admission) Patient's cognitive ability adequate to safely complete daily activities?: Yes Is the patient deaf or have difficulty hearing?: No Does the patient have difficulty seeing, even when wearing glasses/contacts?: No Does the patient have difficulty concentrating, remembering, or making decisions?: No Patient able to express need for assistance with ADLs?: Yes Does the patient have difficulty dressing or bathing?: No Independently performs ADLs?: Yes (appropriate for developmental age) Does the patient have difficulty walking or climbing stairs?: No Weakness of Legs: None Weakness of Arms/Hands: None  Permission Sought/Granted                  Emotional Assessment Appearance:: Appears stated age Attitude/Demeanor/Rapport: Engaged Affect (typically observed): Appropriate, Accepting Orientation: : Oriented to Self, Oriented to Place, Oriented to  Time, Oriented to Situation Alcohol / Substance Use: Not Applicable Psych Involvement: No (comment)  Admission diagnosis:  Endocarditis [I38] Bacteremia [R78.81] S/P aortic valve replacement and aortoplasty [Z95.2] Patient Active Problem List   Diagnosis Date Noted   S/P aortic valve replacement and aortoplasty 10/04/2020   Dental caries    Aortic valve abscess    Protein-calorie malnutrition, severe 09/28/2020   Endocarditis 09/26/2020  Bacteremia 09/26/2020   Fatigue 09/15/2020   Night sweats 09/15/2020   Aortic stenosis    Dyspnea    Thoracic ascending aortic aneurysm (HCC)    Secondary adhesive capsulitis of left shoulder 08/31/2020   Obesity, diabetes, and hypertension syndrome (HCC) 02/28/2020   S/P AVR 09/22/2018   Ascending aortic aneurysm (HCC) 41 mm as measured by echo  07/02/2018   Bicuspid aortic valve 06/25/2018   Critical aortic valve stenosis mean gradient 64 mmHg, 06/25/2018   GERD (gastroesophageal reflux disease) 06/24/2018   Hyperlipidemia 06/24/2018   Essential hypertension 06/24/2018   Alcohol dependency (HCC) 06/24/2018   Heart murmur 06/24/2018   Atherosclerotic heart disease 06/24/2018   Alcoholism (HCC) 06/24/2018   Diabetes mellitus type 2, controlled (HCC) 06/24/2018   PCP:  [name], MD Pharmacy:   [facility] [phone] - [place], [place] - [address] AT [facility] [address] [place] [address] Phone: [phone] Fax: [phone]     Social Determinants of Health (SDOH) Interventions    Readmission Risk Interventions Readmission Risk Prevention Plan 10/09/2020  Post Dischage Appt Complete  Medication Screening Complete  Transportation Screening Complete  Some recent data might be hidden

## 2020-10-10 ENCOUNTER — Other Ambulatory Visit: Payer: Managed Care, Other (non HMO)

## 2020-10-10 ENCOUNTER — Inpatient Hospital Stay: Payer: Self-pay

## 2020-10-10 LAB — GLUCOSE, CAPILLARY
Glucose-Capillary: 105 mg/dL — ABNORMAL HIGH (ref 70–99)
Glucose-Capillary: 184 mg/dL — ABNORMAL HIGH (ref 70–99)

## 2020-10-10 LAB — AEROBIC/ANAEROBIC CULTURE W GRAM STAIN (SURGICAL/DEEP WOUND): Culture: NO GROWTH

## 2020-10-10 MED ORDER — METOPROLOL TARTRATE 25 MG PO TABS: 12.5000 mg | ORAL_TABLET | Freq: Two times a day (BID) | ORAL | 1 refills | Status: DC

## 2020-10-10 MED ORDER — FE FUMARATE-B12-VIT C-FA-IFC PO CAPS: 1.0000 | ORAL_CAPSULE | Freq: Two times a day (BID) | ORAL | 2 refills | Status: DC

## 2020-10-10 MED ORDER — LACTULOSE 10 GM/15ML PO SOLN
30.0000 g | ORAL | Status: AC
  Administered 2020-10-10: 30 g via ORAL
  Filled 2020-10-10: qty 45

## 2020-10-10 MED ORDER — CEFTRIAXONE IV (FOR PTA / DISCHARGE USE ONLY): 2.0000 g | INTRAVENOUS | 0 refills | Status: AC

## 2020-10-10 MED ORDER — ATORVASTATIN CALCIUM 80 MG PO TABS: 80.0000 mg | ORAL_TABLET | Freq: Every day | ORAL | 1 refills | Status: DC

## 2020-10-10 MED ORDER — TRAMADOL HCL 50 MG PO TABS: 50.0000 mg | ORAL_TABLET | Freq: Four times a day (QID) | ORAL | 0 refills | Status: DC | PRN

## 2020-10-10 NOTE — TOC Transition Note (Signed)
Transition of Care (TOC) - CM/SW Discharge Note Donn Pierini RN, BSN Transitions of Care Unit 4E- RN Case Manager See Treatment Team for direct phone #    Patient Details  Name: Ronnie Ruiz MRN: [id number] Date of Birth: Apr 09, 1966  Transition of Care [facility]) CM/SW Contact:  Darrold Span, RN Phone Number: 10/10/2020, 12:05 PM   Clinical Narrative:    Pt stable for transition home today, Home IV abx have been arranged with [facility] with [facility] nursing coordinating nursing needs.  Pam with [facility] infusion to come and see pt for bedside education, per Pam she should be here around noon to see pt. Pt will need to receive abx here today prior to discharge- Pam to assist with contacting ID regarding timing of abx for discharge needs.  Pt also has double lumen PICC and will need to have that exchanged out for a single lumen PICC for home.   Bedside RN has been updated to the above, Patient will discharge later today when everything has been taken care of regarding home IV abx needs.    Final next level of care: Home w Home Health Services Barriers to Discharge: No Barriers Identified   Patient Goals and CMS Choice Patient states their goals for this hospitalization and ongoing recovery are:: return home   Choice offered to / list presented to : NA  Discharge Placement                 Home with [facility]      Discharge Plan and Services   Discharge Planning Services: CM Consult Post Acute Care Choice: Home Health          DME Arranged: N/A DME Agency: NA       HH Arranged: RN, IV Antibiotics HH Agency: Other - See comment, [facility]) Date [facility] Agency Contacted: 10/09/20 Time HH Agency Contacted: 1000 Representative spoke with at [facility] Agency: Pam  Social Determinants of Health (SDOH) Interventions     Readmission Risk Interventions Readmission Risk Prevention Plan 10/09/2020  Post Dischage Appt Complete  Medication Screening Complete   Transportation Screening Complete  Some recent data might be hidden

## 2020-10-10 NOTE — Progress Notes (Addendum)
[address]       [place] [address]             [phone]      6 Days Post-Op Procedure(s) (LRB): REDO STERNOTOMY (N/A) REDO BENTALL PROCEDURE ON PUMP WITH A HEMASHIELD PLATINUM GRAFT AND HOMOGRAFT (N/A) TRANSESOPHAGEAL ECHOCARDIOGRAM (TEE) (N/A) APPLICATION OF CELL SAVER ENDOVEIN HARVEST OF GREATER SAPHENOUS VEIN (Right) Subjective: No BM. + flatus, very comfortable  Objective: Vital signs in last 24 hours: Temp:  [97.6 F (36.4 C)-98.2 F (36.8 C)] 97.7 F (36.5 C) (06/21 0324) Pulse Rate:  [65-88] 65 (06/21 0324) Cardiac Rhythm: Normal sinus rhythm (06/20 2008) Resp:  [14-18] 15 (06/21 0324) BP: (90-130)/(54-74) 90/54 (06/21 0324) SpO2:  [95 %-99 %] 96 % (06/21 0324) Weight:  [88.8 kg] 88.8 kg (06/21 0324)  Hemodynamic parameters for last 24 hours:    Intake/Output from previous day: 06/20 0701 - 06/21 0700 In: 1162 [P.O.:962; IV Piggyback:200] Out: -  Intake/Output this shift: No intake/output data recorded.  General appearance: alert, cooperative, and no distress Heart: regular rate and rhythm Lungs: clear to auscultation bilaterally Abdomen: benign Extremities: no edema Wound: incis healing well  Lab Results: Recent Labs    10/08/20 0123  WBC 9.6  HGB 8.0*  HCT 25.1*  PLT 297   BMET:  Recent Labs    10/08/20 0123  NA 133*  K 3.7  CL 99  CO2 26  GLUCOSE 108*  BUN 7  CREATININE 0.54*  CALCIUM 8.6*    PT/INR: No results for input(s): LABPROT, INR in the last 72 hours. ABG    Component Value Date/Time   PHART 7.405 10/05/2020 1408   HCO3 22.5 10/05/2020 1408   TCO2 24 10/05/2020 1408   ACIDBASEDEF 2.0 10/05/2020 1408   O2SAT 99.0 10/05/2020 1408   CBG (last 3)  Recent Labs    10/09/20 1557 10/09/20 2010 10/10/20 0605  GLUCAP 131* 117* 105*    Meds Scheduled Meds:  aspirin EC  325 mg Oral Daily   atorvastatin  80 mg Oral Daily   bisacodyl  10 mg Oral Daily   Or   bisacodyl  10 mg Rectal  Daily   Chlorhexidine Gluconate Cloth  6 each Topical Daily   docusate sodium  200 mg Oral Daily   ferrous fumarate-b12-vitamic C-folic acid  1 capsule Oral BID PC   insulin aspart  0-24 Units Subcutaneous TID WC   insulin detemir  20 Units Subcutaneous Daily   mouth rinse  15 mL Mouth Rinse BID   metFORMIN  500 mg Oral BID WC   metoprolol tartrate  12.5 mg Oral BID   pantoprazole  40 mg Oral Daily   polyethylene glycol  17 g Oral Daily   sodium chloride flush  10-40 mL Intracatheter Q12H   sodium chloride flush  3 mL Intravenous Q12H   Continuous Infusions:  sodium chloride Stopped (10/06/20 1521)   cefTRIAXone (ROCEPHIN)  IV 2 g (10/09/20 2123)   lactated ringers Stopped (10/05/20 0724)   lactated ringers Stopped (10/06/20 1238)   PRN Meds:.sodium chloride, magnesium hydroxide, metoprolol tartrate, ondansetron (ZOFRAN) IV, sodium chloride flush, sodium chloride flush, traMADol  Xrays No results found.  Assessment/Plan: S/P Procedure(s) (LRB): REDO STERNOTOMY (N/A) REDO BENTALL PROCEDURE ON PUMP WITH A HEMASHIELD PLATINUM GRAFT AND HOMOGRAFT (N/A) TRANSESOPHAGEAL ECHOCARDIOGRAM (TEE) (N/A) APPLICATION OF CELL SAVER ENDOVEIN HARVEST OF GREATER SAPHENOUS VEIN (Right)   1 afeb, VSS 2 sats good on RA  3 no new labs 4 weight stable 5 BS improved with metformin 6 will try lactulose this am 7 home today if all home needs are final   LOS: 14 days    Rowe Clack PA-C Pager [phone] 10/10/2020    Chart reviewed, patient examined, agree with above. He looks great. Operative cultures all negative so far. Plan to send home today on antibiotic per PICC per ID rec. He will follow up with Dr. Luciana Axe. I will see him in the office in a couple weeks.

## 2020-10-10 NOTE — Progress Notes (Signed)
Pt being D/C, VSS, Double lumen PICC was modified to single lumen, Education provided.   Kalman Jewels, RN 10/10/2020 4:49 PM

## 2020-10-10 NOTE — Progress Notes (Signed)
Peripherally Inserted Central Catheter Placement  The IV Nurse has discussed with the patient and/or persons authorized to consent for the patient, the purpose of this procedure and the potential benefits and risks involved with this procedure.  The benefits include less needle sticks, lab draws from the catheter, and the patient may be discharged home with the catheter. Risks include, but not limited to, infection, bleeding, blood clot (thrombus formation), and puncture of an artery; nerve damage and irregular heartbeat and possibility to perform a PICC exchange if needed/ordered by physician.  Alternatives to this procedure were also discussed.  Bard Power PICC patient education guide, fact sheet on infection prevention and patient information card has been provided to patient /or left at bedside.    PICC Placement Documentation  PICC Single Lumen 10/10/20 Right Brachial 42 cm 0 cm (Active)  Indication for Insertion or Continuance of Line Home intravenous therapies (PICC only) 10/10/20 1614  Exposed Catheter (cm) 0 cm 10/10/20 1614  Site Assessment Clean;Dry;Intact 10/10/20 1614  Line Status Flushed;Saline locked;Blood return noted 10/10/20 1614  Dressing Type Transparent;Securing device 10/10/20 1614  Dressing Status Clean;Dry;Intact 10/10/20 1614  Antimicrobial disc in place? Yes 10/10/20 1614  Safety Lock Not Applicable 10/10/20 1614  Dressing Change Due 10/17/20 10/10/20 1614  PICC consent signed previously by patient. SL PICC exchanged per protocol.     Oumar, Marcott 10/10/2020, 4:16 PM

## 2020-10-10 NOTE — Progress Notes (Signed)
CARDIAC REHAB PHASE I   D/c education completed with pt. Pt educated on importance of site care and monitoring incisions daily. Encouraged continued IS use, walks, and sternal precautions. Pt given in-the-tube sheet along with heart healthy and diabetic diets. Reviewed restrictions and exercise guidelines. Will refer to CRP II Ronnie Ruiz. Pt denies further DME needs at this time.  [id number] Reynold Bowen, RN BSN 10/10/2020 10:00 AM

## 2020-10-10 NOTE — Progress Notes (Signed)
[place] for Infectious Disease   Reason for visit: follow up on Aggregatibacter perivalvular abscess  Interval History: no acute events, remains afebrile, no concerns.   Physical Exam: Constitutional:  Vitals:   10/10/20 0324 10/10/20 0807  BP: (!) 90/54 125/60  Pulse: 65 87  Resp: 15 16  Temp: 97.7 F (36.5 C) 98.5 F (36.9 C)  SpO2: 96% 98%  He appears in nad Respiratory: normal respiratory effort, CTA B Cardiovascular: RRR   Review of Systems: Constitutional: negative for fever or chills Gastrointestinal: negative for diarrhea  Lab Results  Component Value Date   WBC 9.6 10/08/2020   HGB 8.0 (L) 10/08/2020   HCT 25.1 (L) 10/08/2020   MCV 91.9 10/08/2020   PLT 297 10/08/2020    Lab Results  Component Value Date   CREATININE 0.54 (L) 10/08/2020   BUN 7 10/08/2020   NA 133 (L) 10/08/2020   K 3.7 10/08/2020   CL 99 10/08/2020   CO2 26 10/08/2020    Lab Results  Component Value Date   ALT 25 09/26/2020   AST 24 09/26/2020   ALKPHOS 60 09/26/2020     Microbiology: Recent Results (from the past 240 hour(s))  Surgical pcr screen     Status: None   Collection Time: 10/03/20  8:56 AM   Specimen: Nasal Mucosa; Nasal Swab  Result Value Ref Range Status   MRSA, PCR NEGATIVE NEGATIVE Final   Staphylococcus aureus NEGATIVE NEGATIVE Final    Comment: (NOTE) The Xpert SA Assay (FDA approved for NASAL specimens in patients 39 years of age and older), is one component of a comprehensive surveillance program. It is not intended to diagnose infection nor to guide or monitor treatment. Performed at [facility], [place] [address] [place], [place] [address]   Fungus Culture With Stain     Status: None (Preliminary result)   Collection Time: 10/04/20  2:28 PM   Specimen: PATH Soft tissue resection  Result Value Ref Range Status   Fungus Stain Final report  Final    Comment: (NOTE) Performed At: [facility] [place], [place]  [id number] Rush Farmer MD AN:[id number]    Fungus (Mycology) Culture PENDING  Incomplete   Fungal Source WOUND  Final    Comment: GORTEX GRAFT Performed at [facility], [place] [address] [place], [place] [address]   Aerobic/Anaerobic Culture w Gram Stain (surgical/deep wound)     Status: None   Collection Time: 10/04/20  2:28 PM   Specimen: PATH Soft tissue resection  Result Value Ref Range Status   Specimen Description WOUND  Final   Special Requests GORTEX GRAFT  Final   Gram Stain   Final    FEW WBC PRESENT, PREDOMINANTLY PMN NO ORGANISMS SEEN    Culture   Final    No growth aerobically or anaerobically. Performed at [facility], [place] [address] [place], [place] [address]    Report Status 10/09/2020 FINAL  Final  Acid Fast Smear (AFB)     Status: None   Collection Time: 10/04/20  2:28 PM   Specimen: PATH Soft tissue resection  Result Value Ref Range Status   AFB Specimen Processing Comment  Final    Comment: Tissue Grinding and Digestion/Decontamination   Acid Fast Smear Negative  Final    Comment: (NOTE) Performed At: [facility] [address] [place], [place] [id number] Rush Farmer MD DH:[id number]    Source (AFB) WOUND  Final  Comment: GORTEX GRAFT Performed at [facility], [place] [address] [place], [place] [address]   Fungus Culture Result     Status: None   Collection Time: 10/04/20  2:28 PM  Result Value Ref Range Status   Result 1 Comment  Final    Comment: (NOTE) KOH/Calcofluor preparation:  no fungus observed. Performed At: [facility] [place], [place] [id number] Rush Farmer MD FO:[id number]   Aerobic/Anaerobic Culture w Gram Stain (surgical/deep wound)     Status: None   Collection Time: 10/04/20  2:31 PM   Specimen: PATH Soft tissue  Result Value Ref Range Status   Specimen Description ABSCESS  Final   Special Requests PERIAORTIC PT ON ANCEF VANC  Final   Gram Stain   Final    MODERATE  WBC PRESENT, PREDOMINANTLY PMN NO ORGANISMS SEEN    Culture   Final    No growth aerobically or anaerobically. Performed at [facility], [place] [address], [place], [place] [address]    Report Status 10/09/2020 FINAL  Final  Acid Fast Smear (AFB)     Status: None   Collection Time: 10/04/20  2:31 PM   Specimen: PATH Soft tissue  Result Value Ref Range Status   AFB Specimen Processing Concentration  Final   Acid Fast Smear Negative  Final    Comment: (NOTE) Performed At: [facility] [place], [place] [id number] Rush Farmer MD MO:[id number]    Source (AFB) ABSCESS  Final    Comment: PERIAORTIC Performed at [facility], [place] [address] [place], [place] [address]   Fungus Culture With Stain     Status: None (Preliminary result)   Collection Time: 10/04/20  2:49 PM   Specimen: PATH Soft tissue resection  Result Value Ref Range Status   Fungus Stain Final report  Final    Comment: (NOTE) Performed At: [facility] [place], [place] [id number] Rush Farmer MD YF:[id number]    Fungus (Mycology) Culture PENDING  Incomplete   Fungal Source TISSUE  Final    Comment: AORTIC VALVE Performed at [facility], [place] [address], [place], [place] [address]   Aerobic/Anaerobic Culture w Gram Stain (surgical/deep wound)     Status: None (Preliminary result)   Collection Time: 10/04/20  2:49 PM   Specimen: PATH Soft tissue resection  Result Value Ref Range Status   Specimen Description TISSUE  Final   Special Requests AORTIC VALVE PT ON ANCEF , VANC  Final   Gram Stain   Final    MODERATE WBC PRESENT, PREDOMINANTLY PMN NO ORGANISMS SEEN    Culture   Final    NO GROWTH 5 DAYS NO ANAEROBES ISOLATED; CULTURE IN PROGRESS FOR 5 DAYS Performed at [facility], [address], [place], [place] [address]    Report Status PENDING  Incomplete  Acid Fast Smear (AFB)     Status: None   Collection Time: 10/04/20  2:49 PM    Specimen: PATH Soft tissue resection  Result Value Ref Range Status   AFB Specimen Processing Concentration  Final   Acid Fast Smear Negative  Final    Comment: (NOTE) Performed At: [facility] [place], [place] [id number] Rush Farmer MD JQ:[id number]    Source (AFB) TISSUE  Final    Comment: AORTIC VALVE Performed at [facility], [place] [address], [place], [place] [address]   Fungus Culture Result     Status:  None   Collection Time: 10/04/20  2:49 PM  Result Value Ref Range Status   Result 1 Comment  Final    Comment: (NOTE) KOH/Calcofluor preparation:  no fungus observed. Performed At: [facility] [place], [place] [id number] Rush Farmer MD HX:[id number]     Impression/Plan:  1. Prosthetic valve endocarditis/abscess - s/p redo and no concerns now.  Doing well and likely discharging today.   Follow up with me arranged as noted below  2.  Medication monitoring - will get weekly labs per home health with results to me   Diagnosis: Prosthetic valve endocarditis/perivalvular abscess  Culture Result: Aggregatibacter  No Known Allergies  OPAT Orders Discharge antibiotics to be given via PICC line Discharge antibiotics: ceftriaxone 2 grams every 24 hours IV Per pharmacy protocol yes Duration: 6 weeks End Date: 11/15/20  [facility] Per Protocol: yes  Home health RN for IV administration and teaching; PICC line care and labs.    Labs weekly while on IV antibiotics: _x_ CBC with differential __ BMP _x_ CMP __ CRP __ ESR __ Vancomycin trough __ CK  _x_ Please pull PIC at completion of IV antibiotics __ Please leave PIC in place until doctor has seen patient or been notified  Fax weekly labs to [phone]  Clinic Follow Up Appt: 10/31/20  @ 1:45 pm via Video visit with Dr. Linus Salmons

## 2020-10-11 ENCOUNTER — Telehealth: Payer: Self-pay | Admitting: Cardiology

## 2020-10-11 NOTE — Telephone Encounter (Signed)
Spoke to patient wife. Informed her there is no fmla paperwork uploaded in chart which is normally where it would be if received. Also explained protocol of this paperwork that the release must be signed and $29.00 fee paid. She understood she plans to bring paperwork to office tomorrow and get form for patient to sign and pay fee. She is aware once all this is done then we can fill out. She is also aware Dr. Bing Matter is in [facility] tomorrow and we will have to have it faxed to him there for him to address. No further questions.

## 2020-10-11 NOTE — Telephone Encounter (Signed)
New message:     Patient wife calling stating that there where some FMLA papers faxed around September 29, 2020 to filled out.they are giving the patient until tomorrow to have this paper work in.

## 2020-10-12 ENCOUNTER — Telehealth (HOSPITAL_COMMUNITY): Payer: Self-pay

## 2020-10-12 DIAGNOSIS — Z0279 Encounter for issue of other medical certificate: Secondary | ICD-10-CM

## 2020-10-12 NOTE — Telephone Encounter (Signed)
Spoke to patient wife informed her FMLA paperwork was signed and will be sent to patient employer she also asked we email to her I have done this. Fax confirmation to employer has also came back.

## 2020-10-12 NOTE — Telephone Encounter (Signed)
Pt's wife, Arden Tinoco came in and dropped of [employer] leave management FMLA forms, and paid $29 fee in cash/ also had patient sign necessary ROI/form paperwork. Received on 10/12/2020. Faxed to [facility] for completion since that is where Dr. Bing Matter is today.  Incomplete FMLA forms/completed form sheet & required ROI scanned into chart.  10/12/20 kbl

## 2020-10-12 NOTE — Telephone Encounter (Signed)
Per phase I cardiac rehab, fax cardiac rehab referral to [facility]. °

## 2020-10-17 ENCOUNTER — Ambulatory Visit (INDEPENDENT_AMBULATORY_CARE_PROVIDER_SITE_OTHER): Payer: Self-pay | Admitting: *Deleted

## 2020-10-17 ENCOUNTER — Other Ambulatory Visit: Payer: Self-pay

## 2020-10-17 DIAGNOSIS — Z4802 Encounter for removal of sutures: Secondary | ICD-10-CM

## 2020-10-17 NOTE — Progress Notes (Signed)
Patient arrived for nurse visit to remove sutures post- redo bentall 6/15 by Dr. Laneta Simmers.  Three sutures removed with no signs or symptoms of infection noted.  Incisions well approximated. Patient tolerated suture removal well.  Patient instructed to keep the incision site clean and dry. Patient acknowledged instructions given.  All questions answered.

## 2020-10-24 ENCOUNTER — Other Ambulatory Visit: Payer: Self-pay

## 2020-10-24 ENCOUNTER — Ambulatory Visit (INDEPENDENT_AMBULATORY_CARE_PROVIDER_SITE_OTHER): Payer: Managed Care, Other (non HMO) | Admitting: Cardiology

## 2020-10-24 ENCOUNTER — Encounter: Payer: Self-pay | Admitting: Cardiology

## 2020-10-24 VITALS — BP 124/86 | HR 96 | Ht 68.5 in | Wt 187.0 lb

## 2020-10-24 DIAGNOSIS — I330 Acute and subacute infective endocarditis: Secondary | ICD-10-CM | POA: Diagnosis not present

## 2020-10-24 DIAGNOSIS — Z952 Presence of prosthetic heart valve: Secondary | ICD-10-CM | POA: Diagnosis not present

## 2020-10-24 DIAGNOSIS — E1169 Type 2 diabetes mellitus with other specified complication: Secondary | ICD-10-CM | POA: Diagnosis not present

## 2020-10-24 DIAGNOSIS — I152 Hypertension secondary to endocrine disorders: Secondary | ICD-10-CM

## 2020-10-24 DIAGNOSIS — E1159 Type 2 diabetes mellitus with other circulatory complications: Secondary | ICD-10-CM

## 2020-10-24 DIAGNOSIS — I10 Essential (primary) hypertension: Secondary | ICD-10-CM

## 2020-10-24 DIAGNOSIS — E669 Obesity, unspecified: Secondary | ICD-10-CM

## 2020-10-24 NOTE — Patient Instructions (Signed)
Medication Instructions:  No medication changes. *If you need a refill on your cardiac medications before your next appointment, please call your pharmacy*   Lab Work: None ordered If you have labs (blood work) drawn today and your tests are completely normal, you will receive your results only by: . MyChart Message (if you have MyChart) OR . A paper copy in the mail If you have any lab test that is abnormal or we need to change your treatment, we will call you to review the results.   Testing/Procedures: None ordered   Follow-Up: At [facility], you and your health needs are our priority.  As part of our continuing mission to provide you with exceptional heart care, we have created designated Provider Care Teams.  These Care Teams include your primary Cardiologist (physician) and Advanced Practice Providers (APPs -  Physician Assistants and Nurse Practitioners) who all work together to provide you with the care you need, when you need it.  We recommend signing up for the patient portal called "MyChart".  Sign up information is provided on this After Visit Summary.  MyChart is used to connect with patients for Virtual Visits (Telemedicine).  Patients are able to view lab/test results, encounter notes, upcoming appointments, etc.  Non-urgent messages can be sent to your provider as well.   To learn more about what you can do with MyChart, go to [email or web address].    Your next appointment:   2 month(s)  The format for your next appointment:   In Person  Provider:   Robert Krasowski, MD   Other Instructions NA  

## 2020-10-24 NOTE — Progress Notes (Signed)
Cardiology Office Note:    Date:  10/24/2020   ID:  Ronnie Ruiz, DOB [birth year], MRN [id number]  PCP:  Lillard Anes, MD  Cardiologist:  Jenne Campus, MD    Referring MD: Lillard Anes,*   Chief Complaint  Patient presents with   Hospitalization Follow-up    History of Present Illness:    Ronnie Ruiz is a 55 y.o. male with complex past medical history.  He does have history of bicuspid aortic valve as well as ascending aortic aneurysm he did have a Bentall procedure done in 2020.  Also type 2 diabetes, essential hypertension, dyslipidemia, history of ATH abuse.  Gastroesophageal reflux disease.  He was doing quite well until May at that time he did have some cortisone shot done in his left shoulder and then started having difficulty controlling his diabetes on top of that he started having night sweats as well as some spikes of fever.  He end up coming to [facility] he did have positive blood cultures for Aggregatibacter, he was started on ceftriaxone, eventually ended being transferred to [facility] transesophageal echocardiogram being done which showed some possibility of abscess then cardiac CT showed perivalvular abscess.  He was brought back to the operating room and had redo sternotomy with replacing of the ascending aortic graft using 28 mm Hemashield platinum graft and redo Bentall procedure with 23 mm aortic homograft valve/root.  He tolerated procedure quite well was discharged home and now he is follow-up with me in about 3 weeks after procedure.  Overall have to admit being after second of her surgery 3 weeks after he is doing amazingly well.  He still gets some soreness in the chest, all of this is expected.  He does have a PICC line in the right shoulder and he is getting still antibiotics.  He does have poor dental hygiene and plans in the near future is to remove all teeth.  Denies have any swelling of lower extremities and wounds are healing  well.  Past Medical History:  Diagnosis Date   Alcohol dependency ([place]) 06/24/2018   Alcoholism ([place]) 06/24/2018   Aortic stenosis    Ascending aortic aneurysm (HCC) 41 mm as measured by echo 07/02/2018   Atherosclerotic heart disease 06/24/2018   Bacteremia 09/26/2020   Bicuspid aortic valve 06/25/2018   Critical aortic valve stenosis mean gradient 64 mmHg, 06/25/2018   Diabetes mellitus type 2, controlled ([place]) 06/24/2018   Dyspnea    Essential hypertension 06/24/2018   Fatigue 09/15/2020   GERD (gastroesophageal reflux disease) 06/24/2018   Heart murmur 06/24/2018   Hyperlipidemia 06/24/2018   Night sweats 09/15/2020   Obesity, diabetes, and hypertension syndrome ([place]) 02/28/2020   Protein-calorie malnutrition, severe 09/28/2020   S/P aortic valve replacement and aortoplasty 10/04/2020   S/P AVR 09/22/2018   Biological Bentall Procedure using a 25 mm Edwards Magna-Ease pericardial valve and a 28 mm Gelweave Valsalva graft.   Secondary adhesive capsulitis of left shoulder 08/31/2020   Thoracic ascending aortic aneurysm [facility])     Past Surgical History:  Procedure Laterality Date   BENTALL PROCEDURE N/A 09/10/2018   Procedure: BENTALL PROCEDURE USING HEMASHIELD 28, GELWEAVE VALSALVA 28MM, AND MAGNA EASE 25MM;  Surgeon: Gaye Pollack, MD;  Location: [place];  Service: Open Heart Surgery;  Laterality: N/A;   BENTALL PROCEDURE N/A 10/04/2020   Procedure: REDO BENTALL PROCEDURE ON PUMP WITH A 28MM HEMASHIELD PLATINUM GRAFT AND 23MM HOMOGRAFT;  Surgeon: Gaye Pollack, MD;  Location: [place];  Service: Open Heart Surgery;  Laterality: N/A;  AXILLARY CANNULATION WITH 8MM GRAFT   BUBBLE STUDY  09/28/2020   Procedure: BUBBLE STUDY;  Surgeon: Elouise Munroe, MD;  Location: [place];  Service: Cardiology;;   CHOLECYSTECTOMY     ENDOVEIN HARVEST OF GREATER SAPHENOUS VEIN Right 10/04/2020   Procedure: ENDOVEIN HARVEST OF GREATER SAPHENOUS VEIN;  Surgeon: Gaye Pollack, MD;  Location: [place];  Service: Open Heart  Surgery;  Laterality: Right;   Heart valve replaced revision  2022   RIGHT HEART CATH AND CORONARY ANGIOGRAPHY N/A 07/07/2018   Procedure: RIGHT HEART CATH AND CORONARY ANGIOGRAPHY;  Surgeon: Troy Sine, MD;  Location: [place] CV LAB;  Service: Cardiovascular;  Laterality: N/A;   TEE WITHOUT CARDIOVERSION N/A 09/10/2018   Procedure: TRANSESOPHAGEAL ECHOCARDIOGRAM (TEE);  Surgeon: Gaye Pollack, MD;  Location: [place];  Service: Open Heart Surgery;  Laterality: N/A;   TEE WITHOUT CARDIOVERSION N/A 09/28/2020   Procedure: TRANSESOPHAGEAL ECHOCARDIOGRAM (TEE);  Surgeon: Elouise Munroe, MD;  Location: [place];  Service: Cardiology;  Laterality: N/A;   TEE WITHOUT CARDIOVERSION N/A 10/04/2020   Procedure: TRANSESOPHAGEAL ECHOCARDIOGRAM (TEE);  Surgeon: Gaye Pollack, MD;  Location: [place];  Service: Open Heart Surgery;  Laterality: N/A;    Current Medications: Current Meds  Medication Sig   aspirin EC 325 MG EC tablet Take 1 tablet (325 mg total) by mouth daily.   atorvastatin (LIPITOR) 80 MG tablet Take 1 tablet (80 mg total) by mouth daily.   cefTRIAXone (ROCEPHIN) IVPB Inject 2 g into the vein daily. Indication:  Prosthetic valve endocarditis  First Dose: Yes Last Day of Therapy:  11/15/20 Labs - Once weekly:  CBC/D and BMP, Labs - Every other week:  ESR and CRP Method of administration: IV Push Method of administration may be changed at the discretion of home infusion pharmacist based upon assessment of the patient and/or caregiver's ability to self-administer the medication ordered.   docusate sodium (COLACE) 250 MG capsule Take 250 mg by mouth daily as needed for constipation.   esomeprazole (NEXIUM) 20 MG capsule Take 20 mg by mouth every evening.   ferrous [id number] C-folic acid (TRINSICON / FOLTRIN) capsule Take 1 capsule by mouth 2 (two) times daily after a meal.   metFORMIN (GLUCOPHAGE) 500 MG tablet Take 500 mg by mouth 2 (two) times daily with a meal.    metoprolol tartrate (LOPRESSOR) 25 MG tablet Take 0.5 tablets (12.5 mg total) by mouth 2 (two) times daily.   Omega-3 Fatty Acids (FISH OIL) 1000 MG CAPS Take 1,000 mg by mouth 2 (two) times daily.    traMADol (ULTRAM) 50 MG tablet Take 1 tablet (50 mg total) by mouth every 6 (six) hours as needed for moderate pain.     Allergies:   Patient has no known allergies.   Social History   Socioeconomic History   Marital status: Married    Spouse name: Not on file   Number of children: Not on file   Years of education: Not on file   Highest education level: Not on file  Occupational History   Not on file  Tobacco Use   Smoking status: Never   Smokeless tobacco: Former    Types: Snuff  Vaping Use   Vaping Use: Never used  Substance and Sexual Activity   Alcohol use: Not Currently    Alcohol/week: 2.0 standard drinks    Types: 2 Cans of beer per week    Comment: daily  Drug use: Never   Sexual activity: Not Currently  Other Topics Concern   Not on file  Social History Narrative   Not on file   Social Determinants of Health   Financial Resource Strain: Not on file  Food Insecurity: Not on file  Transportation Needs: Not on file  Physical Activity: Not on file  Stress: Not on file  Social Connections: Not on file     Family History: The patient's family history includes Alcoholism in his father; Anxiety disorder in his father; CAD in his father; Hyperlipidemia in his father; Lung cancer in his father and mother; Pulmonary disease in his father and mother; Renal cancer in his father. ROS:   Please see the history of present illness.    All 14 point review of systems negative except as described per history of present illness  EKGs/Labs/Other Studies Reviewed:      Recent Labs: 09/26/2020: ALT 25 10/05/2020: Magnesium 2.5 10/08/2020: BUN 7; Creatinine, Ser 0.54; Hemoglobin 8.0; Platelets 297; Potassium 3.7; Sodium 133  Recent Lipid Panel    Component Value Date/Time    CHOL 192 02/28/2020 0821   TRIG 226 (H) 02/28/2020 0821   HDL 48 02/28/2020 0821   CHOLHDL 4.0 02/28/2020 0821   LDLCALC 105 (H) 02/28/2020 0821    Physical Exam:    VS:  BP 124/86 (BP Location: Left Arm, Patient Position: Sitting)   Pulse 96   Ht 5' 8.5" (1.74 m)   Wt 187 lb (84.8 kg)   SpO2 98%   BMI 28.02 kg/m     Wt Readings from Last 3 Encounters:  10/24/20 187 lb (84.8 kg)  10/10/20 195 lb 11.3 oz (88.8 kg)  09/15/20 193 lb 12.8 oz (87.9 kg)     GEN:  Well nourished, well developed in no acute distress HEENT: Normal NECK: No JVD; No carotid bruits LYMPHATICS: No lymphadenopathy CARDIAC: RRR, mild tachycardia, very soft systolic murmur grade 1 best heard at right upper portion of the sternum, no rubs, no gallops RESPIRATORY:  Clear to auscultation without rales, wheezing or rhonchi  ABDOMEN: Soft, non-tender, non-distended MUSCULOSKELETAL:  No edema; No deformity  SKIN: Warm and dry LOWER EXTREMITIES: no swelling NEUROLOGIC:  Alert and oriented x 3 PSYCHIATRIC:  Normal affect   ASSESSMENT:    1. S/P aortic valve replacement and aortoplasty   2. Essential hypertension   3. Aortic valve abscess   4. Obesity, diabetes, and hypertension syndrome ([place])   5. S/P AVR    PLAN:    In order of problems listed above:  Status post redo dental procedure secondary to aortic abscess.  After admit he is recovering amazingly well after surgery.  This is only 3 weeks he seems to be doing quite well wounds are healing completely. Essential hypertension: Blood pressure 124/86 doing well from that point review. Type 2 diabetes.  That being followed by antimedicine team.  He tells me he did not check his sugar recently I told him it is important to keep his diabetes under control to help with the healing process. I did review extensive records in the chart for that visit. He does not want to participate in rehab however he walks on the regular basis trying to build up his  stamina.   Medication Adjustments/Labs and Tests Ordered: Current medicines are reviewed at length with the patient today.  Concerns regarding medicines are outlined above.  No orders of the defined types were placed in this encounter.  Medication changes: No orders of the  defined types were placed in this encounter.   Signed, Park Liter, MD, [facility] 10/24/2020 11:06 AM    [place]

## 2020-10-31 ENCOUNTER — Encounter: Payer: Self-pay | Admitting: Internal Medicine

## 2020-10-31 ENCOUNTER — Other Ambulatory Visit: Payer: Self-pay

## 2020-10-31 ENCOUNTER — Telehealth (INDEPENDENT_AMBULATORY_CARE_PROVIDER_SITE_OTHER): Payer: Managed Care, Other (non HMO) | Admitting: Internal Medicine

## 2020-10-31 ENCOUNTER — Telehealth: Payer: Self-pay

## 2020-10-31 DIAGNOSIS — I330 Acute and subacute infective endocarditis: Secondary | ICD-10-CM

## 2020-10-31 NOTE — Assessment & Plan Note (Signed)
He continues to do well post surgery with his antibiotics and slowly increasing his activity.  No issues with the antibiotics and labs reviewed with him, no concerns and will plan to continue through 11/15/20 as previously planned.  Will have home health remove the picc line after treatment completion.   He will follow up here as needed.

## 2020-10-31 NOTE — Telephone Encounter (Signed)
I spoke to [facility] with [facility] and verbal orders given for patient's end date for IV antibiotics to be on 11/15/20 and picc can be removed after last dose per Dr. Luciana Axe.  Mary verbalized understanding. Alfons Sulkowski T Pricilla Loveless

## 2020-10-31 NOTE — Progress Notes (Signed)
   Subjective:    Patient ID: Ronnie Ruiz, male    DOB: October 14, 1965, 55 y.o.   MRN: [id number] I connected with  Ronnie Ruiz on 10/31/20 by phone and verified that I am speaking with the correct person using two identifiers.   I discussed the limitations of evaluation and management by telemedicine. The patient expressed understanding and agreed to proceed.  Location: Patient - [place] residence Physician - clinic  Duration of visit:  15 minutes   HPI He is called for follow up of prosthetic valve endocarditis. He was intially at [facility] and found to have Aggregatibacter bacteremia and transferred to [facility] for further evaluation.  He was found to have a perivalvular abscess of his prosthetic aortic valve and required a redo by Dr. Laneta Simmers on 10/04/20.  He was placed on ceftriaxone and plan for 6 weeks of IV ceftriaxone post surgery through July 27th.  He is home now and doing well, tolerating the ceftriaxone with no associated rash or diarrhea.  He has not missed any doses.  No complaints.  PICC line working well with no issues.     Review of Systems  Constitutional:  Positive for fatigue. Negative for chills and fever.  Gastrointestinal:  Negative for diarrhea and nausea.  Skin:  Negative for rash.      Objective:   Physical Exam Neurological:     Mental Status: He is alert.  Psychiatric:        Mood and Affect: Mood normal.          Assessment & Plan:

## 2020-11-01 LAB — FUNGUS CULTURE WITH STAIN

## 2020-11-01 LAB — FUNGAL ORGANISM REFLEX

## 2020-11-02 NOTE — Progress Notes (Signed)
Fungal culture negative lp

## 2020-11-02 NOTE — Progress Notes (Signed)
Fungal culture negative lp

## 2020-11-02 NOTE — Progress Notes (Signed)
Negative fungus on culture lp

## 2020-11-02 NOTE — Progress Notes (Signed)
Negative fungal culture lp

## 2020-11-03 LAB — FUNGUS CULTURE WITH STAIN

## 2020-11-03 LAB — FUNGUS CULTURE RESULT

## 2020-11-03 LAB — FUNGAL ORGANISM REFLEX

## 2020-11-14 ENCOUNTER — Other Ambulatory Visit: Payer: Self-pay | Admitting: Surgery

## 2020-11-14 DIAGNOSIS — Z952 Presence of prosthetic heart valve: Secondary | ICD-10-CM

## 2020-11-15 ENCOUNTER — Ambulatory Visit (INDEPENDENT_AMBULATORY_CARE_PROVIDER_SITE_OTHER): Payer: Self-pay | Admitting: Surgery

## 2020-11-15 ENCOUNTER — Ambulatory Visit
Admission: RE | Admit: 2020-11-15 | Discharge: 2020-11-15 | Disposition: A | Discharge status: Home / Self Care | Payer: Managed Care, Other (non HMO) | Source: Ambulatory Visit | Attending: Surgery | Admitting: Surgery

## 2020-11-15 ENCOUNTER — Other Ambulatory Visit: Payer: Self-pay

## 2020-11-15 ENCOUNTER — Encounter: Payer: Self-pay | Admitting: Surgery

## 2020-11-15 VITALS — BP 129/78 | HR 87 | Resp 20 | Ht 68.5 in | Wt 192.0 lb

## 2020-11-15 DIAGNOSIS — Z952 Presence of prosthetic heart valve: Secondary | ICD-10-CM

## 2020-11-15 NOTE — Progress Notes (Signed)
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  This pathogen is typically related to dental or periodontal infection and he was seen in the hospital preoperatively by Dr. Hoyt Koch who felt that his orthopantogram was negative and he did not have any acute problems that need to be addressed preoperatively. The patient's early postoperative recovery while in the hospital was notable for an uncomplicated postoperative course. Since hospital discharge the patient reports that he has been feeling well.  He is ambulating without shortness of breath or chest pain.  He has mild chest wall soreness.  His last day of IV antibiotics is today and his PICC line is to come out after that.  He has been followed by Dr. Linus Salmons.   Current Outpatient Medications  Medication Sig Dispense Refill   aspirin EC 325 MG EC tablet Take 1 tablet (325 mg total) by mouth daily.     atorvastatin (LIPITOR) 80 MG tablet Take 1 tablet (80 mg total) by mouth daily. 30 tablet 1   cefTRIAXone (ROCEPHIN) IVPB Inject 2 g into the vein daily. Indication:  Prosthetic valve endocarditis  First Dose: Yes Last Day of Therapy:  11/15/20 Labs - Once weekly:  CBC/D and BMP, Labs - Every other week:  ESR and CRP Method of administration: IV Push Method of administration may be changed at the discretion of home infusion pharmacist based upon assessment of the patient and/or caregiver's ability to self-administer the medication ordered. 37 Units 0   docusate sodium (COLACE) 250 MG capsule Take 250 mg by mouth daily as needed for constipation.     esomeprazole (NEXIUM) 20 MG capsule Take 20 mg by mouth every evening.     ferrous [id number]  C-folic acid (TRINSICON / FOLTRIN) capsule Take 1 capsule by mouth 2 (two) times daily after a meal. 60 capsule 2   metFORMIN (GLUCOPHAGE) 500 MG tablet Take 500 mg by mouth 2 (two) times daily with a meal.     metoprolol tartrate (LOPRESSOR) 25 MG tablet Take 0.5 tablets (12.5 mg total) by mouth 2 (two) times daily. 30 tablet 1   Omega-3 Fatty Acids (FISH OIL) 1000 MG CAPS Take 1,000 mg by mouth 2 (two) times daily.      traMADol (ULTRAM) 50 MG tablet Take 1 tablet (50 mg total) by mouth every 6 (six) hours as needed for moderate pain. 28 tablet 0   No current facility-administered medications for this visit.    Physical Exam: BP 129/78   Pulse 87   Resp 20   Ht 5' 8.5" (1.74 m)   Wt 192 lb (87.1 kg)   SpO2 96% Comment: RA  BMI 28.77 kg/m  He looks well. Cardiac exam shows regular rate and rhythm with normal heart sounds.  There is no murmur. Lungs are clear. The chest incision is healing well and the sternum is stable. There is no peripheral edema  Diagnostic Tests:  Narrative & Impression  CLINICAL DATA:  Status post aortic valve replacement and aortoplasty.   EXAM: CHEST - 2 VIEW   COMPARISON:  Prior chest x-ray 10/07/2020   FINDINGS: The lungs are clear and negative for focal airspace consolidation, pulmonary edema or suspicious pulmonary nodule. Interval resolution of fluid within the left major and right minor fissures. A right upper  extremity PICC in stable position. Catheter tip overlies the cavoatrial junction. Patient is status post median sternotomy. No pleural effusion or pneumothorax. Cardiac and mediastinal contours are within normal limits. No acute fracture or lytic or blastic osseous lesions. The visualized upper abdominal bowel gas pattern is unremarkable.   IMPRESSION: 1. No acute cardiopulmonary process. 2. Right upper extremity PICC with the catheter tip overlying the cavoatrial junction.     Electronically Signed   By: Jacqulynn Cadet  M.D.   On: 11/15/2020 12:49      Impression:  He is recovering well following his surgery.  He is to complete his IV antibiotic course today and his PICC line will be removed after that.  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  I gave him a note to return to work as of December 26, 2020.  Plan:  He will continue to follow-up with cardiology and I will plan to see him back in 6 months with a follow-up CTA of the chest to get a new baseline.  He should have a follow-up echo postoperatively and I will leave that decision up to cardiology.  He is going to see his dentist about having the remainder of his teeth extracted to prevent further infection.   Gaye Pollack, MD [facility] [phone]

## 2020-11-17 LAB — ACID FAST CULTURE WITH REFLEXED SENSITIVITIES (MYCOBACTERIA): Acid Fast Culture: NEGATIVE

## 2020-11-20 ENCOUNTER — Other Ambulatory Visit: Payer: Self-pay | Admitting: Physician Assistant

## 2020-11-20 ENCOUNTER — Telehealth: Payer: Self-pay | Admitting: Cardiology

## 2020-11-20 MED ORDER — AMOXICILLIN 500 MG PO CAPS: ORAL_CAPSULE | ORAL | 0 refills | Status: DC

## 2020-11-20 NOTE — Telephone Encounter (Signed)
   [facility] Pre-operative Risk Assessment    Patient Name: Ronnie Ruiz  DOB: 12/02/1965 MRN: [id number]  HEARTCARE STAFF:  - IMPORTANT!!!!!! Under Visit Info/Reason for Call, type in Other and utilize the format Clearance MM/DD/YY or Clearance TBD. Do not use dashes or single digits. - Please review there is not already an duplicate clearance open for this procedure. - If request is for dental extraction, please clarify the # of teeth to be extracted. - If the patient is currently at the dentist's office, call Pre-Op Callback Staff (MA/nurse) to input urgent request.  - If the patient is not currently in the dentist office, please route to the Pre-Op pool.  Request for surgical clearance:  What type of surgery is being performed? Extractions  When is this surgery scheduled? 11/20/2020 at 3:20  What type of clearance is required (medical clearance vs. Pharmacy clearance to hold med vs. Both)? unknown  Are there any medications that need to be held prior to surgery and how long? unknown  Practice name and name of physician performing surgery? [facility]  What is the office phone number? [phone]   7.   What is the office fax number? [phone]  8.   Anesthesia type (None, local, MAC, general) ? unknown   Lars Pinks 11/20/2020, 10:59 AM  _________________________________________________________________   (provider comments below)

## 2020-11-20 NOTE — Telephone Encounter (Signed)
Patient has NKDA therefore should use amoxicillin 2g 30-60 min prior to procedure. Rx has been sent to pharmacy

## 2020-11-20 NOTE — Telephone Encounter (Signed)
    Patient Name: Ronnie Ruiz  DOB: 02/05/1966 MRN: [id number]  Primary Cardiologist: Gypsy Balsam, MD  Chart reviewed as part of pre-operative protocol coverage. Given past medical history and time since last visit, based on ACC/AHA guidelines, Ronnie Ruiz would be at acceptable risk for the planned procedure without further cardiovascular testing.   The patient was advised that if he develops new symptoms prior to surgery to contact our office to arrange for a follow-up visit, and he verbalized understanding.  Patient requires SBE prophylaxis prior to any dental procedure.  I will route this recommendation to the requesting party via Epic fax function and remove from pre-op pool.  Please call with questions.  [place], [place] 11/20/2020, 12:12 PM

## 2020-11-20 NOTE — Telephone Encounter (Signed)
Note, patient recently was diagnosed with perivalvular abscess.  And underwent redo sternotomy with replacement of ascending aortic graft using a 28 mm Hemashield graft and redo Bentall procedure with 23 mm aortic homograft valve/root.  He left the hospital with a PICC line, PICC line was discontinued on 11/15/2020.  He will need SBE prophylaxis prior to any dental procedure.  Clinical pharmacist to recommend SBE prophylaxis and send the prescription to his pharmacy in [facility]

## 2020-11-21 LAB — ACID FAST CULTURE WITH REFLEXED SENSITIVITIES (MYCOBACTERIA): Acid Fast Culture: NEGATIVE

## 2020-12-12 LAB — ACID FAST CULTURE WITH REFLEXED SENSITIVITIES (MYCOBACTERIA): Acid Fast Culture: NEGATIVE

## 2020-12-18 ENCOUNTER — Telehealth: Payer: Self-pay | Admitting: Cardiology

## 2020-12-18 MED ORDER — AMOXICILLIN 500 MG PO CAPS: ORAL_CAPSULE | ORAL | 0 refills | Status: DC

## 2020-12-18 MED ORDER — METOPROLOL TARTRATE 25 MG PO TABS: 12.5000 mg | ORAL_TABLET | Freq: Two times a day (BID) | ORAL | 3 refills | Status: DC

## 2020-12-18 MED ORDER — ATORVASTATIN CALCIUM 80 MG PO TABS: 80.0000 mg | ORAL_TABLET | Freq: Every day | ORAL | 3 refills | Status: DC

## 2020-12-18 NOTE — Telephone Encounter (Signed)
Spoke to the patient and the patients wife just now and they let me know that he needed a refill on several of his medications. I sent these in for him at this time.    Encouraged patient to call back with any questions or concerns.

## 2020-12-18 NOTE — Telephone Encounter (Signed)
New message:    Patient stating that we gave him a antibiotic fotr the dentist

## 2021-01-10 ENCOUNTER — Ambulatory Visit (INDEPENDENT_AMBULATORY_CARE_PROVIDER_SITE_OTHER): Payer: Managed Care, Other (non HMO) | Admitting: Cardiology

## 2021-01-10 ENCOUNTER — Other Ambulatory Visit: Payer: Self-pay

## 2021-01-10 ENCOUNTER — Encounter: Payer: Self-pay | Admitting: Cardiology

## 2021-01-10 VITALS — BP 122/62 | HR 62 | Ht 68.0 in | Wt 203.2 lb

## 2021-01-10 DIAGNOSIS — E7800 Pure hypercholesterolemia, unspecified: Secondary | ICD-10-CM

## 2021-01-10 DIAGNOSIS — I10 Essential (primary) hypertension: Secondary | ICD-10-CM

## 2021-01-10 DIAGNOSIS — Z13220 Encounter for screening for lipoid disorders: Secondary | ICD-10-CM

## 2021-01-10 DIAGNOSIS — Z952 Presence of prosthetic heart valve: Secondary | ICD-10-CM | POA: Diagnosis not present

## 2021-01-10 DIAGNOSIS — K029 Dental caries, unspecified: Secondary | ICD-10-CM

## 2021-01-10 NOTE — Progress Notes (Signed)
Cardiology Office Note:    Date:  01/10/2021   ID:  Ronnie Ruiz, DOB 02/24/1966, MRN [id number]  PCP:  [name], MD  Cardiologist:  Gypsy Balsam, MD    Referring MD: [name],*   Chief Complaint  Patient presents with   Follow-up  I am doing fine  History of Present Illness:    Ronnie Ruiz is a 55 y.o. male  with complex past medical history.  He does have history of bicuspid aortic valve as well as ascending aortic aneurysm he did have a Bentall procedure done in 2020.  Also type 2 diabetes, essential hypertension, dyslipidemia, history of ATH abuse.  Gastroesophageal reflux disease.  He was doing quite well until May at that time he did have some cortisone shot done in his left shoulder and then started having difficulty controlling his diabetes on top of that he started having night sweats as well as some spikes of fever.  He end up coming to [facility] he did have positive blood cultures for Aggregatibacter, he was started on ceftriaxone, eventually ended being transferred to [facility] transesophageal echocardiogram being done which showed some possibility of abscess then cardiac CT showed perivalvular abscess.  He was brought back to the operating room and had redo sternotomy with replacing of the ascending aortic graft using 28 mm Hemashield platinum graft and redo Bentall procedure with 23 mm aortic homograft valve/root.  He tolerated procedure quite well was discharged home and  He comes today 2 months of follow-up overall he is doing very well.  He denies have any chest pain tightness squeezing pressure burning chest.  He is back at work.  The only complaint he have is the fact that he sometimes he is tired.  Past Medical History:  Diagnosis Date   Alcohol dependency (HCC) 06/24/2018   Alcoholism (HCC) 06/24/2018   Aortic stenosis    Ascending aortic aneurysm (HCC) 41 mm as measured by echo 07/02/2018   Atherosclerotic heart disease 06/24/2018    Bacteremia 09/26/2020   Bicuspid aortic valve 06/25/2018   Critical aortic valve stenosis mean gradient 64 mmHg, 06/25/2018   Diabetes mellitus type 2, controlled (HCC) 06/24/2018   Dyspnea    Essential hypertension 06/24/2018   Fatigue 09/15/2020   GERD (gastroesophageal reflux disease) 06/24/2018   Heart murmur 06/24/2018   Hyperlipidemia 06/24/2018   Night sweats 09/15/2020   Obesity, diabetes, and hypertension syndrome (HCC) 02/28/2020   Protein-calorie malnutrition, severe 09/28/2020   S/P aortic valve replacement and aortoplasty 10/04/2020   S/P AVR 09/22/2018   Biological Bentall Procedure using a 25 mm Edwards Magna-Ease pericardial valve and a 28 mm Gelweave Valsalva graft.   Secondary adhesive capsulitis of left shoulder 08/31/2020   Thoracic ascending aortic aneurysm [facility])     Past Surgical History:  Procedure Laterality Date   BENTALL PROCEDURE N/A 09/10/2018   Procedure: BENTALL PROCEDURE USING HEMASHIELD 28, GELWEAVE VALSALVA , AND MAGNA EASE ;  Surgeon: [name], MD;  Location: [facility] OR;  Service: Open Heart Surgery;  Laterality: N/A;   BENTALL PROCEDURE N/A 10/04/2020   Procedure: REDO BENTALL PROCEDURE ON PUMP WITH A HEMASHIELD PLATINUM GRAFT AND HOMOGRAFT;  Surgeon: [name], MD;  Location: [facility] OR;  Service: Open Heart Surgery;  Laterality: N/A;  AXILLARY CANNULATION WITH GRAFT   BUBBLE STUDY  09/28/2020   Procedure: BUBBLE STUDY;  Surgeon: Parke Poisson, MD;  Location: [facility] ENDOSCOPY;  Service: Cardiology;;   CHOLECYSTECTOMY  ENDOVEIN HARVEST OF GREATER SAPHENOUS VEIN Right 10/04/2020   Procedure: ENDOVEIN HARVEST OF GREATER SAPHENOUS VEIN;  Surgeon: [name], MD;  Location: [facility] OR;  Service: Open Heart Surgery;  Laterality: Right;   Heart valve replaced revision  2022   RIGHT HEART CATH AND CORONARY ANGIOGRAPHY N/A 07/07/2018   Procedure: RIGHT HEART CATH AND CORONARY ANGIOGRAPHY;  Surgeon: Lennette Bihari, MD;  Location: [facility] INVASIVE CV LAB;   Service: Cardiovascular;  Laterality: N/A;   TEE WITHOUT CARDIOVERSION N/A 09/10/2018   Procedure: TRANSESOPHAGEAL ECHOCARDIOGRAM (TEE);  Surgeon: [name], MD;  Location: [facility] OR;  Service: Open Heart Surgery;  Laterality: N/A;   TEE WITHOUT CARDIOVERSION N/A 09/28/2020   Procedure: TRANSESOPHAGEAL ECHOCARDIOGRAM (TEE);  Surgeon: Parke Poisson, MD;  Location: [facility] ENDOSCOPY;  Service: Cardiology;  Laterality: N/A;   TEE WITHOUT CARDIOVERSION N/A 10/04/2020   Procedure: TRANSESOPHAGEAL ECHOCARDIOGRAM (TEE);  Surgeon: [name], MD;  Location: [facility] OR;  Service: Open Heart Surgery;  Laterality: N/A;    Current Medications: Current Meds  Medication Sig   aspirin EC 325 MG EC tablet Take 1 tablet (325 mg total) by mouth daily.   atorvastatin (LIPITOR) 80 MG tablet Take 1 tablet (80 mg total) by mouth daily.   docusate sodium (COLACE) 250 MG capsule Take 250 mg by mouth daily as needed for constipation.   esomeprazole (NEXIUM) 20 MG capsule Take 20 mg by mouth every evening.   ferrous fumarate-b12-vitamic C-folic acid (TRINSICON / FOLTRIN) capsule Take 1 capsule by mouth 2 (two) times daily after a meal.   metFORMIN (GLUCOPHAGE) 500 MG tablet Take 500 mg by mouth 2 (two) times daily with a meal.   metoprolol tartrate (LOPRESSOR) 25 MG tablet Take 0.5 tablets (12.5 mg total) by mouth 2 (two) times daily.   Omega-3 Fatty Acids (FISH OIL) 1000 MG CAPS Take 1,000 mg by mouth 2 (two) times daily.    traMADol (ULTRAM) 50 MG tablet Take 1 tablet (50 mg total) by mouth every 6 (six) hours as needed for moderate pain.     Allergies:   Patient has no known allergies.   Social History   Socioeconomic History   Marital status: Married    Spouse name: Not on file   Number of children: Not on file   Years of education: Not on file   Highest education level: Not on file  Occupational History   Not on file  Tobacco Use   Smoking status: Never   Smokeless tobacco: Former    Types: Snuff   Vaping Use   Vaping Use: Never used  Substance and Sexual Activity   Alcohol use: Not Currently    Alcohol/week: 2.0 standard drinks    Types: 2 Cans of beer per week    Comment: daily   Drug use: Never   Sexual activity: Not Currently  Other Topics Concern   Not on file  Social History Narrative   Not on file   Social Determinants of Health   Financial Resource Strain: Not on file  Food Insecurity: Not on file  Transportation Needs: Not on file  Physical Activity: Not on file  Stress: Not on file  Social Connections: Not on file     Family History: The patient's family history includes Alcoholism in his father; Anxiety disorder in his father; CAD in his father; Hyperlipidemia in his father; Lung cancer in his father and mother; Pulmonary disease in his father and mother; Renal cancer in his father. ROS:  Please see the history of present illness.    All 14 point review of systems negative except as described per history of present illness  EKGs/Labs/Other Studies Reviewed:      Recent Labs: 09/26/2020: ALT 25 10/05/2020: Magnesium 2.5 10/08/2020: BUN 7; Creatinine, Ser 0.54; Hemoglobin 8.0; Platelets 297; Potassium 3.7; Sodium 133  Recent Lipid Panel    Component Value Date/Time   CHOL 192 02/28/2020 0821   TRIG 226 (H) 02/28/2020 0821   HDL 48 02/28/2020 0821   CHOLHDL 4.0 02/28/2020 0821   LDLCALC 105 (H) 02/28/2020 0821    Physical Exam:    VS:  BP 122/62 (BP Location: Right Arm, Patient Position: Sitting)   Pulse 62   Ht 5\' 8"  (1.727 m)   Wt 203 lb 3.2 oz (92.2 kg)   SpO2 99%   BMI 30.90 kg/m     Wt Readings from Last 3 Encounters:  01/10/21 203 lb 3.2 oz (92.2 kg)  11/15/20 192 lb (87.1 kg)  10/24/20 187 lb (84.8 kg)     GEN:  Well nourished, well developed in no acute distress HEENT: Normal NECK: No JVD; No carotid bruits LYMPHATICS: No lymphadenopathy CARDIAC: RRR, systolic ejection murmur grade 1/6 best heard right upper portion of the  sternum, no rubs, no gallops RESPIRATORY:  Clear to auscultation without rales, wheezing or rhonchi  ABDOMEN: Soft, non-tender, non-distended MUSCULOSKELETAL:  No edema; No deformity  SKIN: Warm and dry LOWER EXTREMITIES: no swelling NEUROLOGIC:  Alert and oriented x 3 PSYCHIATRIC:  Normal affect   ASSESSMENT:    1. S/P AVR   2. S/P aortic valve replacement and aortoplasty   3. Pure hypercholesterolemia   4. Dental caries   5. Essential hypertension    PLAN:    In order of problems listed above:  Status post redo Bentall procedure.  He is recovering quite nicely wound is healed completely.  He is doing clinically very well.  I will schedule him to have echocardiogram to have better baseline for this scenario.  In the future surgeons planning to do also CT angio of his chest. Dyslipidemia schedule him to have fasting lipid profile.  I do have his cholesterol from [facility] that but this is from last year with LDL of 105 HDL 48.  We will recheck it. Essential hypertension: Blood pressure well controlled continue present management. Poor dental status.  He did see dentist already he did have surgery done to remove his teeth.  He got 25 as. Diabetes fairly controlled.  Ask him to see his general doctor soon to better manage this.  I offered him endocrinologist but he declined   Medication Adjustments/Labs and Tests Ordered: Current medicines are reviewed at length with the patient today.  Concerns regarding medicines are outlined above.  No orders of the defined types were placed in this encounter.  Medication changes: No orders of the defined types were placed in this encounter.   Signed, 12/25/20, MD, [facility] 01/10/2021 10:02 AM    [facility]

## 2021-01-10 NOTE — Patient Instructions (Signed)
Medication Instructions:  Your physician recommends that you continue on your current medications as directed. Please refer to the Current Medication list given to you today.  *If you need a refill on your cardiac medications before your next appointment, please call your pharmacy*   Lab Work: Your physician recommends that you return for lab work in:  WHEN YOU COME FOR YOUR ECHO: Lipids - please come fasting If you have labs (blood work) drawn today and your tests are completely normal, you will receive your results only by: MyChart Message (if you have MyChart) OR A paper copy in the mail If you have any lab test that is abnormal or we need to change your treatment, we will call you to review the results.   Testing/Procedures: Your physician has requested that you have an echocardiogram. Echocardiography is a painless test that uses sound waves to create images of your heart. It provides your doctor with information about the size and shape of your heart and how well your heart's chambers and valves are working. This procedure takes approximately one hour. There are no restrictions for this procedure.    Follow-Up: At [facility], you and your health needs are our priority.  As part of our continuing mission to provide you with exceptional heart care, we have created designated Provider Care Teams.  These Care Teams include your primary Cardiologist (physician) and Advanced Practice Providers (APPs -  Physician Assistants and Nurse Practitioners) who all work together to provide you with the care you need, when you need it.  We recommend signing up for the patient portal called "MyChart".  Sign up information is provided on this After Visit Summary.  MyChart is used to connect with patients for Virtual Visits (Telemedicine).  Patients are able to view lab/test results, encounter notes, upcoming appointments, etc.  Non-urgent messages can be sent to your provider as well.   To learn more  about what you can do with MyChart, go to [email or web address].    Your next appointment:   3 month(s)  The format for your next appointment:   In Person  Provider:   Gypsy Balsam, MD   Other Instructions Echocardiogram An echocardiogram is a test that uses sound waves (ultrasound) to produce images of the heart. Images from an echocardiogram can provide important information about: Heart size and shape. The size and thickness and movement of your heart's walls. Heart muscle function and strength. Heart valve function or if you have stenosis. Stenosis is when the heart valves are too narrow. If blood is flowing backward through the heart valves (regurgitation). A tumor or infectious growth around the heart valves. Areas of heart muscle that are not working well because of poor blood flow or injury from a heart attack. Aneurysm detection. An aneurysm is a weak or damaged part of an artery wall. The wall bulges out from the normal force of blood pumping through the body. Tell a health care provider about: Any allergies you have. All medicines you are taking, including vitamins, herbs, eye drops, creams, and over-the-counter medicines. Any blood disorders you have. Any surgeries you have had. Any medical conditions you have. Whether you are pregnant or may be pregnant. What are the risks? Generally, this is a safe test. However, problems may occur, including an allergic reaction to dye (contrast) that may be used during the test. What happens before the test? No specific preparation is needed. You may eat and drink normally. What happens during the test?  You will take  off your clothes from the waist up and put on a hospital gown. Electrodes or electrocardiogram (ECG)patches may be placed on your chest. The electrodes or patches are then connected to a device that monitors your heart rate and rhythm. You will lie down on a table for an ultrasound exam. A gel will be applied  to your chest to help sound waves pass through your skin. A handheld device, called a transducer, will be pressed against your chest and moved over your heart. The transducer produces sound waves that travel to your heart and bounce back (or "echo" back) to the transducer. These sound waves will be captured in real-time and changed into images of your heart that can be viewed on a video monitor. The images will be recorded on a computer and reviewed by your health care provider. You may be asked to change positions or hold your breath for a short time. This makes it easier to get different views or better views of your heart. In some cases, you may receive contrast through an IV in one of your veins. This can improve the quality of the pictures from your heart. The procedure may vary among health care providers and hospitals. What can I expect after the test? You may return to your normal, everyday life, including diet, activities, and medicines, unless your health care provider tells you not to do that. Follow these instructions at home: It is up to you to get the results of your test. Ask your health care provider, or the department that is doing the test, when your results will be ready. Keep all follow-up visits. This is important. Summary An echocardiogram is a test that uses sound waves (ultrasound) to produce images of the heart. Images from an echocardiogram can provide important information about the size and shape of your heart, heart muscle function, heart valve function, and other possible heart problems. You do not need to do anything to prepare before this test. You may eat and drink normally. After the echocardiogram is completed, you may return to your normal, everyday life, unless your health care provider tells you not to do that. This information is not intended to replace advice given to you by your health care provider. Make sure you discuss any questions you have with your health  care provider. Document Revised: 11/30/2019 Document Reviewed: 11/30/2019 Elsevier Patient Education  2022 [employer].

## 2021-01-11 ENCOUNTER — Ambulatory Visit (INDEPENDENT_AMBULATORY_CARE_PROVIDER_SITE_OTHER): Payer: Managed Care, Other (non HMO)

## 2021-01-11 DIAGNOSIS — Z952 Presence of prosthetic heart valve: Secondary | ICD-10-CM | POA: Diagnosis not present

## 2021-01-12 ENCOUNTER — Telehealth: Payer: Self-pay

## 2021-01-12 LAB — ECHOCARDIOGRAM COMPLETE
AR max vel: 2.19 cm2
AV Area VTI: 2 cm2
AV Area mean vel: 2.29 cm2
AV Mean grad: 5 mmHg
AV Peak grad: 10.2 mmHg
Ao pk vel: 1.6 m/s
S' Lateral: 3.3 cm

## 2021-01-12 NOTE — Telephone Encounter (Signed)
Spoke with patient regarding results and recommendation.  Patient verbalizes understanding and is agreeable to plan of care. Advised patient to call back with any issues or concerns.  

## 2021-01-12 NOTE — Telephone Encounter (Signed)
-----   Message from Georgeanna Lea, MD sent at 01/12/2021  1:16 PM EDT ----- Echocardiogram looks good, valve looks very good.

## 2021-02-06 LAB — LIPID PANEL
Chol/HDL Ratio: 3.2 ratio (ref 0.0–5.0)
Cholesterol, Total: 154 mg/dL (ref 100–199)
HDL: 48 mg/dL
LDL Chol Calc (NIH): 72 mg/dL (ref 0–99)
Triglycerides: 203 mg/dL — ABNORMAL HIGH (ref 0–149)
VLDL Cholesterol Cal: 34 mg/dL (ref 5–40)

## 2021-03-20 ENCOUNTER — Telehealth: Payer: Self-pay | Admitting: Cardiology

## 2021-03-20 NOTE — Telephone Encounter (Signed)
Pts wife reaching out because pt has surgery scheduled for Monday and says that Dr. Bing Matter normally sends him an antibiotic for these procedures and is needing him to do so.. please advise

## 2021-03-22 NOTE — Telephone Encounter (Signed)
Pt's wife is f/u on pt getting antibiotic prior to his Dental Procedure scheduled for Monday.

## 2021-03-23 MED ORDER — AMOXICILLIN 500 MG PO TABS: 2000.0000 mg | ORAL_TABLET | Freq: Once | ORAL | 0 refills | Status: AC

## 2021-03-23 NOTE — Telephone Encounter (Signed)
Spoke to patient wife informed her we will send in amoxicillin 2000 mg for him to take 30 minutes before his procedure. She understood no further questions.

## 2021-03-27 ENCOUNTER — Other Ambulatory Visit: Payer: Self-pay | Admitting: *Deleted

## 2021-03-27 DIAGNOSIS — I712 Thoracic aortic aneurysm, without rupture, unspecified: Secondary | ICD-10-CM

## 2021-03-27 DIAGNOSIS — Z952 Presence of prosthetic heart valve: Secondary | ICD-10-CM

## 2021-03-27 NOTE — Progress Notes (Unsigned)
Ct ac  

## 2021-04-12 ENCOUNTER — Other Ambulatory Visit: Payer: Self-pay

## 2021-04-12 ENCOUNTER — Encounter: Payer: Self-pay | Admitting: Cardiology

## 2021-04-12 ENCOUNTER — Ambulatory Visit (INDEPENDENT_AMBULATORY_CARE_PROVIDER_SITE_OTHER): Payer: Managed Care, Other (non HMO) | Admitting: Cardiology

## 2021-04-12 VITALS — BP 140/70 | HR 72 | Ht 68.6 in | Wt 204.8 lb

## 2021-04-12 DIAGNOSIS — K029 Dental caries, unspecified: Secondary | ICD-10-CM

## 2021-04-12 DIAGNOSIS — Z952 Presence of prosthetic heart valve: Secondary | ICD-10-CM

## 2021-04-12 DIAGNOSIS — I10 Essential (primary) hypertension: Secondary | ICD-10-CM | POA: Diagnosis not present

## 2021-04-12 DIAGNOSIS — I330 Acute and subacute infective endocarditis: Secondary | ICD-10-CM | POA: Diagnosis not present

## 2021-04-12 NOTE — Patient Instructions (Signed)

## 2021-04-12 NOTE — Progress Notes (Signed)
Cardiology Office Note:    Date:  04/12/2021   ID:  Ronnie Ruiz, DOB May 29, 1965, MRN [id number]  PCP:  [name], MD  Cardiologist:  Gypsy Balsam, MD    Referring MD: [name],*   No chief complaint on file. Doing fine  History of Present Illness:    Ronnie Ruiz is a 55 y.o. male  with complex past medical history.  He does have history of bicuspid aortic valve as well as ascending aortic aneurysm he did have a Bentall procedure done in 2020.  Also type 2 diabetes, essential hypertension, dyslipidemia, history of ATH abuse.  Gastroesophageal reflux disease.  He was doing quite well until May at that time he did have some cortisone shot done in his left shoulder and then started having difficulty controlling his diabetes on top of that he started having night sweats as well as some spikes of fever.  He end up coming to [facility] he did have positive blood cultures for Aggregatibacter, he was started on ceftriaxone, eventually ended being transferred to [facility] transesophageal echocardiogram being done which showed some possibility of abscess then cardiac CT showed perivalvular abscess.  He was brought back to the operating room and had redo sternotomy with replacing of the ascending aortic graft using 28 mm Hemashield platinum graft and redo Bentall procedure with 23 mm aortic homograft valve/root.  He tolerated procedure quite well was discharged home and  He is coming today 2 months of follow-up.  Overall he is doing very well.  He denies have any chest pain tightness squeezing pressure burning chest working have no difficulty doing it  Past Medical History:  Diagnosis Date   Alcohol dependency (HCC) 06/24/2018   Alcoholism (HCC) 06/24/2018   Aortic stenosis    Aortic valve abscess    Ascending aortic aneurysm (HCC) 41 mm as measured by echo 07/02/2018   Atherosclerotic heart disease 06/24/2018   Bacteremia 09/26/2020   Bicuspid aortic valve 06/25/2018    Critical aortic valve stenosis mean gradient 64 mmHg, 06/25/2018   Dental caries    Diabetes mellitus type 2, controlled (HCC) 06/24/2018   Dyspnea    Endocarditis 09/26/2020   Essential hypertension 06/24/2018   Fatigue 09/15/2020   GERD (gastroesophageal reflux disease) 06/24/2018   Heart murmur 06/24/2018   Hyperlipidemia 06/24/2018   Night sweats 09/15/2020   Obesity, diabetes, and hypertension syndrome (HCC) 02/28/2020   Protein-calorie malnutrition, severe 09/28/2020   S/P aortic valve replacement and aortoplasty 10/04/2020   S/P AVR 09/22/2018   Biological Bentall Procedure using a 25 mm Edwards Magna-Ease pericardial valve and a 28 mm Gelweave Valsalva graft.   Secondary adhesive capsulitis of left shoulder 08/31/2020   Thoracic ascending aortic aneurysm     Past Surgical History:  Procedure Laterality Date   BENTALL PROCEDURE N/A 09/10/2018   Procedure: BENTALL PROCEDURE USING HEMASHIELD 28, GELWEAVE VALSALVA , AND MAGNA EASE ;  Surgeon: [name], MD;  Location: [facility] OR;  Service: Open Heart Surgery;  Laterality: N/A;   BENTALL PROCEDURE N/A 10/04/2020   Procedure: REDO BENTALL PROCEDURE ON PUMP WITH A HEMASHIELD PLATINUM GRAFT AND HOMOGRAFT;  Surgeon: [name], MD;  Location: [facility] OR;  Service: Open Heart Surgery;  Laterality: N/A;  AXILLARY CANNULATION WITH GRAFT   BUBBLE STUDY  09/28/2020   Procedure: BUBBLE STUDY;  Surgeon: Parke Poisson, MD;  Location: [facility] ENDOSCOPY;  Service: Cardiology;;   CHOLECYSTECTOMY     ENDOVEIN HARVEST OF GREATER  SAPHENOUS VEIN Right 10/04/2020   Procedure: ENDOVEIN HARVEST OF GREATER SAPHENOUS VEIN;  Surgeon: Gaye Pollack, MD;  Location: [place];  Service: Open Heart Surgery;  Laterality: Right;   Heart valve replaced revision  2022   RIGHT HEART CATH AND CORONARY ANGIOGRAPHY N/A 07/07/2018   Procedure: RIGHT HEART CATH AND CORONARY ANGIOGRAPHY;  Surgeon: Troy Sine, MD;  Location: [place] CV LAB;  Service:  Cardiovascular;  Laterality: N/A;   TEE WITHOUT CARDIOVERSION N/A 09/10/2018   Procedure: TRANSESOPHAGEAL ECHOCARDIOGRAM (TEE);  Surgeon: Gaye Pollack, MD;  Location: [place];  Service: Open Heart Surgery;  Laterality: N/A;   TEE WITHOUT CARDIOVERSION N/A 09/28/2020   Procedure: TRANSESOPHAGEAL ECHOCARDIOGRAM (TEE);  Surgeon: Elouise Munroe, MD;  Location: [place];  Service: Cardiology;  Laterality: N/A;   TEE WITHOUT CARDIOVERSION N/A 10/04/2020   Procedure: TRANSESOPHAGEAL ECHOCARDIOGRAM (TEE);  Surgeon: Gaye Pollack, MD;  Location: [place];  Service: Open Heart Surgery;  Laterality: N/A;    Current Medications: Current Meds  Medication Sig   aspirin EC 325 MG EC tablet Take 1 tablet (325 mg total) by mouth daily.   atorvastatin (LIPITOR) 80 MG tablet Take 1 tablet (80 mg total) by mouth daily.   docusate sodium (COLACE) 250 MG capsule Take 250 mg by mouth daily as needed for constipation.   esomeprazole (NEXIUM) 20 MG capsule Take 20 mg by mouth every evening.   ferrous Q000111Q C-folic acid (TRINSICON / FOLTRIN) capsule Take 1 capsule by mouth 2 (two) times daily after a meal.   metFORMIN (GLUCOPHAGE) 500 MG tablet Take 500 mg by mouth 2 (two) times daily with a meal.   metoprolol tartrate (LOPRESSOR) 25 MG tablet Take 0.5 tablets (12.5 mg total) by mouth 2 (two) times daily.   Omega-3 Fatty Acids (FISH OIL) 1000 MG CAPS Take 1,000 mg by mouth 2 (two) times daily.    traMADol (ULTRAM) 50 MG tablet Take 1 tablet (50 mg total) by mouth every 6 (six) hours as needed for moderate pain.     Allergies:   Patient has no known allergies.   Social History   Socioeconomic History   Marital status: Married    Spouse name: Not on file   Number of children: Not on file   Years of education: Not on file   Highest education level: Not on file  Occupational History   Not on file  Tobacco Use   Smoking status: Never   Smokeless tobacco: Former    Types: Snuff  Vaping  Use   Vaping Use: Never used  Substance and Sexual Activity   Alcohol use: Not Currently    Alcohol/week: 2.0 standard drinks    Types: 2 Cans of beer per week    Comment: daily   Drug use: Never   Sexual activity: Not Currently  Other Topics Concern   Not on file  Social History Narrative   Not on file   Social Determinants of Health   Financial Resource Strain: Not on file  Food Insecurity: Not on file  Transportation Needs: Not on file  Physical Activity: Not on file  Stress: Not on file  Social Connections: Not on file     Family History: The patient's family history includes Alcoholism in his father; Anxiety disorder in his father; CAD in his father; Hyperlipidemia in his father; Lung cancer in his father and mother; Pulmonary disease in his father and mother; Renal cancer in his father. ROS:   Please see the history  of present illness.    All 14 point review of systems negative except as described per history of present illness  EKGs/Labs/Other Studies Reviewed:      Recent Labs: 09/26/2020: ALT 25 10/05/2020: Magnesium 2.5 10/08/2020: BUN 7; Creatinine, Ser 0.54; Hemoglobin 8.0; Platelets 297; Potassium 3.7; Sodium 133  Recent Lipid Panel    Component Value Date/Time   CHOL 154 02/05/2021 0814   TRIG 203 (H) 02/05/2021 0814   HDL 48 02/05/2021 0814   CHOLHDL 3.2 02/05/2021 0814   LDLCALC 72 02/05/2021 0814    Physical Exam:    VS:  BP 140/70    Pulse 72    Ht 5' 8.6" (1.742 m)    Wt 204 lb 12.8 oz (92.9 kg)    SpO2 96%    BMI 30.60 kg/m     Wt Readings from Last 3 Encounters:  04/12/21 204 lb 12.8 oz (92.9 kg)  01/10/21 203 lb 3.2 oz (92.2 kg)  11/15/20 192 lb (87.1 kg)     GEN:  Well nourished, well developed in no acute distress HEENT: Normal NECK: No JVD; No carotid bruits LYMPHATICS: No lymphadenopathy CARDIAC: RRR, soft systolic murmur grade 1/6 to 2/6 best heard at upper portion of the sternum, no rubs, no gallops RESPIRATORY:  Clear to  auscultation without rales, wheezing or rhonchi  ABDOMEN: Soft, non-tender, non-distended MUSCULOSKELETAL:  No edema; No deformity  SKIN: Warm and dry LOWER EXTREMITIES: no swelling NEUROLOGIC:  Alert and oriented x 3 PSYCHIATRIC:  Normal affect   ASSESSMENT:    1. S/P aortic valve replacement and aortoplasty   2. S/P AVR   3. Dental caries   4. Aortic valve abscess   5. Essential hypertension    PLAN:    In order of problems listed above:  Status post aortic valve replacement with related complications for aortic abscess that required to redo Bentall procedure he did recover quite nicely and practically back to himself.  Denies having any cardiac symptoms.  He is being follow-up by cardiothoracic surgery for it. Dyslipidemia I did review K PN which show me his LDL of 72 HDL 48 he is on Lipitor 80. Essential hypertension: Blood pressure seems to be a little elevated but he said he is nervous today when he checks at home is always good.  Continue present management. Diabetes and again we discussed the issue of the fact that he need to control his diabetes the best he can last hemoglobin A1c from summer was 8.7 which is clearly too high.  He understands the problem he is trying to do better job. Poor dental hygiene.  He only got 2 teeth left.  He is planning to have those extracted and then get dentures.   Medication Adjustments/Labs and Tests Ordered: Current medicines are reviewed at length with the patient today.  Concerns regarding medicines are outlined above.  No orders of the defined types were placed in this encounter.  Medication changes: No orders of the defined types were placed in this encounter.   Signed, Park Liter, MD, [facility] 04/12/2021 9:03 AM    [place]

## 2021-04-23 NOTE — Progress Notes (Signed)
Subjective:  Patient ID: Ronnie Ruiz, male    DOB: [birth year]  Age: 56 y.o. MRN: [id number]  Chief Complaint  Patient presents with   Diabetes   Hyperlipidemia    HPI: chronic visit  Patient present with` type 2 diabetes.  Specifically, this is type 2, nonbinsulin requiring diabetes, complicated by hypertension,hypercholesterolemia.  Compliance with treatment has been good; patient take medicines as directed, maintains diet and exercise regimen, follows up as directed, and is keeping glucose diary.  Date of  diagnosis 03/22/2016.  Depression screen has been performed.Tobacco screen { smoker, nonsmoker. Current medicines for diabetes metformin 500mg  BID.  Patient is not medication for renal protection and atorvastatin 80 mg for cholesterol control.  Patient performs foot exams daily and last ophthalmologic exam was needs eye exam.  Patient mentioned after he had valve replacement four months ago, his blood sugar levels has been high. Range of blood sugar 220's to 230's.   Patient presents for follow up of hypertension.  Patient tolerating metoprolol 25 mg daily well without side effects.  Patient was diagnosed with hypertension 2010 so has been treated for hypertension for 12 years.Patient is working on maintaining diet and exercise regimen and follows up as directed. Complication include none.   Patient presents with hyperlipidemia.  Compliance with treatment has been good; patient takes medicines as directed, maintains low cholesterol diet, follows up as directed, and maintains exercise regimen.  Patient is using atorvastatin 80 mg daily and Fish oil without problems.   Still drinking 18 beers a week- we discussed cutting to one beer a day Current Outpatient Medications on File Prior to Visit  Medication Sig Dispense Refill   aspirin EC 325 MG EC tablet Take 1 tablet (325 mg total) by mouth daily.     atorvastatin (LIPITOR) 80 MG tablet Take 1 tablet (80 mg total) by mouth daily. 90 tablet  3   docusate sodium (COLACE) 250 MG capsule Take 250 mg by mouth daily as needed for constipation.     esomeprazole (NEXIUM) 20 MG capsule Take 20 mg by mouth every evening.     ferrous Q000111Q C-folic acid (TRINSICON / FOLTRIN) capsule Take 1 capsule by mouth 2 (two) times daily after a meal. 60 capsule 2   metFORMIN (GLUCOPHAGE) 500 MG tablet Take 500 mg by mouth 2 (two) times daily with a meal.     metoprolol tartrate (LOPRESSOR) 25 MG tablet Take 0.5 tablets (12.5 mg total) by mouth 2 (two) times daily. 90 tablet 3   Omega-3 Fatty Acids (FISH OIL) 1000 MG CAPS Take 1,000 mg by mouth 2 (two) times daily.      traMADol (ULTRAM) 50 MG tablet Take 1 tablet (50 mg total) by mouth every 6 (six) hours as needed for moderate pain. 28 tablet 0   No current facility-administered medications on file prior to visit.   Past Medical History:  Diagnosis Date   Alcohol dependency ([place]) 06/24/2018   Alcoholism ([place]) 06/24/2018   Aortic stenosis    Aortic valve abscess    Ascending aortic aneurysm (HCC) 41 mm as measured by echo 07/02/2018   Atherosclerotic heart disease 06/24/2018   Bacteremia 09/26/2020   Bicuspid aortic valve 06/25/2018   Critical aortic valve stenosis mean gradient 64 mmHg, 06/25/2018   Dental caries    Diabetes mellitus type 2, controlled ([place]) 06/24/2018   Dyspnea    Endocarditis 09/26/2020   Essential hypertension 06/24/2018   Fatigue 09/15/2020   GERD (gastroesophageal reflux disease) 06/24/2018   Heart  murmur 06/24/2018   Hyperlipidemia 06/24/2018   Night sweats 09/15/2020   Obesity, diabetes, and hypertension syndrome ([place]) 02/28/2020   Protein-calorie malnutrition, severe 09/28/2020   S/P aortic valve replacement and aortoplasty 10/04/2020   S/P AVR 09/22/2018   Biological Bentall Procedure using a 25 mm Edwards Magna-Ease pericardial valve and a 28 mm Gelweave Valsalva graft.   Secondary adhesive capsulitis of left shoulder 08/31/2020   Thoracic ascending aortic aneurysm    Past  Surgical History:  Procedure Laterality Date   BENTALL PROCEDURE N/A 09/10/2018   Procedure: BENTALL PROCEDURE USING HEMASHIELD 28, GELWEAVE VALSALVA 28MM, AND MAGNA EASE 25MM;  Surgeon: Gaye Pollack, MD;  Location: [place];  Service: Open Heart Surgery;  Laterality: N/A;   BENTALL PROCEDURE N/A 10/04/2020   Procedure: REDO BENTALL PROCEDURE ON PUMP WITH A 28MM HEMASHIELD PLATINUM GRAFT AND 23MM HOMOGRAFT;  Surgeon: Gaye Pollack, MD;  Location: [place];  Service: Open Heart Surgery;  Laterality: N/A;  AXILLARY CANNULATION WITH 8MM GRAFT   BUBBLE STUDY  09/28/2020   Procedure: BUBBLE STUDY;  Surgeon: Elouise Munroe, MD;  Location: [place];  Service: Cardiology;;   CHOLECYSTECTOMY     ENDOVEIN HARVEST OF GREATER SAPHENOUS VEIN Right 10/04/2020   Procedure: ENDOVEIN HARVEST OF GREATER SAPHENOUS VEIN;  Surgeon: Gaye Pollack, MD;  Location: [place];  Service: Open Heart Surgery;  Laterality: Right;   Heart valve replaced revision  2022   RIGHT HEART CATH AND CORONARY ANGIOGRAPHY N/A 07/07/2018   Procedure: RIGHT HEART CATH AND CORONARY ANGIOGRAPHY;  Surgeon: Troy Sine, MD;  Location: [place] CV LAB;  Service: Cardiovascular;  Laterality: N/A;   TEE WITHOUT CARDIOVERSION N/A 09/10/2018   Procedure: TRANSESOPHAGEAL ECHOCARDIOGRAM (TEE);  Surgeon: Gaye Pollack, MD;  Location: [place];  Service: Open Heart Surgery;  Laterality: N/A;   TEE WITHOUT CARDIOVERSION N/A 09/28/2020   Procedure: TRANSESOPHAGEAL ECHOCARDIOGRAM (TEE);  Surgeon: Elouise Munroe, MD;  Location: [place];  Service: Cardiology;  Laterality: N/A;   TEE WITHOUT CARDIOVERSION N/A 10/04/2020   Procedure: TRANSESOPHAGEAL ECHOCARDIOGRAM (TEE);  Surgeon: Gaye Pollack, MD;  Location: [place];  Service: Open Heart Surgery;  Laterality: N/A;    Family History  Problem Relation Age of Onset   Lung cancer Mother    Pulmonary disease Mother    Alcoholism Father    Anxiety disorder Father    Hyperlipidemia  Father    Lung cancer Father    CAD Father    Renal cancer Father    Pulmonary disease Father    Social History   Socioeconomic History   Marital status: Married    Spouse name: Not on file   Number of children: Not on file   Years of education: Not on file   Highest education level: Not on file  Occupational History   Not on file  Tobacco Use   Smoking status: Never   Smokeless tobacco: Former    Types: Snuff  Vaping Use   Vaping Use: Never used  Substance and Sexual Activity   Alcohol use: Not Currently    Alcohol/week: 2.0 standard drinks    Types: 2 Cans of beer per week    Comment: daily   Drug use: Never   Sexual activity: Not Currently  Other Topics Concern   Not on file  Social History Narrative   Not on file   Social Determinants of Health   Financial Resource Strain: Not on file  Food Insecurity: Not on file  Transportation Needs: Not on file  Physical Activity: Not on file  Stress: Not on file  Social Connections: Not on file    Review of Systems  Constitutional:  Negative for chills, fatigue, fever and unexpected weight change.  HENT:  Negative for congestion, ear pain, sinus pain and sore throat.   Cardiovascular:  Negative for chest pain and palpitations.  Gastrointestinal:  Negative for abdominal pain, blood in stool, constipation, diarrhea, nausea and vomiting.  Endocrine: Negative for polydipsia.  Genitourinary:  Negative for dysuria.  Musculoskeletal:  Negative for back pain.  Skin:  Negative for rash.  Neurological:  Negative for headaches.    Objective:  BP (!) 148/80    Pulse 80    Temp (!) 96.5 F (35.8 C)    Ht 5' 8.5" (1.74 m)    Wt 201 lb (91.2 kg)    SpO2 100%    BMI 30.12 kg/m   BP/Weight 04/24/2021 04/12/2021 [id number]  Systolic BP 123456 [id number]  Diastolic BP 80 70 62  Wt. (Lbs) 201 204.8 203.2  BMI 30.12 30.6 30.9    Physical Exam Constitutional:      Appearance: Normal appearance.  HENT:     Right Ear: Tympanic membrane  and ear canal normal.     Left Ear: Tympanic membrane and ear canal normal.     Nose: Nose normal.     Mouth/Throat:     Mouth: Mucous membranes are moist.  Eyes:     Pupils: Pupils are equal, round, and reactive to light.  Cardiovascular:     Rate and Rhythm: Normal rate and regular rhythm.     Pulses: Normal pulses.     Heart sounds: Normal heart sounds.     Comments: Aortic murmur 1/6. Pulmonary:     Effort: Pulmonary effort is normal.     Breath sounds: Normal breath sounds.  Abdominal:     General: Bowel sounds are normal.     Palpations: Abdomen is soft. There is no mass.     Tenderness: There is no abdominal tenderness.  Neurological:     Mental Status: He is alert and oriented to person, place, and time.     Motor: No weakness.  Psychiatric:        Mood and Affect: Mood normal.        Behavior: Behavior normal.        Thought Content: Thought content normal.    Diabetic Foot Exam - Simple   Simple Foot Form Diabetic Foot exam was performed with the following findings: Yes 04/24/2021  9:37 AM  Visual Inspection No deformities, no ulcerations, no other skin breakdown bilaterally: Yes Sensation Testing Intact to touch and monofilament testing bilaterally: Yes Pulse Check Posterior Tibialis and Dorsalis pulse intact bilaterally: Yes Comments Left foot bunion.      Lab Results  Component Value Date   WBC 9.6 10/08/2020   HGB 8.0 (L) 10/08/2020   HCT 25.1 (L) 10/08/2020   PLT 297 10/08/2020   GLUCOSE 108 (H) 10/08/2020   CHOL 154 02/05/2021   TRIG 203 (H) 02/05/2021   HDL 48 02/05/2021   LDLCALC 72 02/05/2021   ALT 25 09/26/2020   AST 24 09/26/2020   NA 133 (L) 10/08/2020   K 3.7 10/08/2020   CL 99 10/08/2020   CREATININE 0.54 (L) 10/08/2020   BUN 7 10/08/2020   CO2 26 10/08/2020   INR 1.4 (H) 10/04/2020   HGBA1C 8.7 (H) 10/03/2020      Assessment & Plan:  Diagnoses and all orders for this visit: Essential hypertension -     Comprehensive  metabolic panel -     CBC with Differential/Platelet An individual hypertension care plan was established and reinforced today.  The patient's status was assessed using clinical findings on exam and labs or diagnostic tests. The patient's success at meeting treatment goals on disease specific evidence-based guidelines and found to be well controlled. SELF MANAGEMENT: The patient and I together assessed ways to personally work towards obtaining the recommended goals. RECOMMENDATIONS: avoid decongestants found in common cold remedies, decrease consumption of alcohol, perform routine monitoring of BP with home BP cuff, exercise, reduction of dietary salt, take medicines as prescribed, try not to miss doses and quit smoking.  Regular exercise and maintaining a healthy weight is needed.  Stress reduction may help. A CLINICAL SUMMARY including written plan identify barriers to care unique to individual due to social or financial issues.  We attempt to mutually creat solutions for individual and family understanding.   Atherosclerosis of native coronary artery of native heart without angina pectoris -     Lipid panel Patient has atherosclerosis of artery found on CT, check cholesterol  Obesity, diabetes, and hypertension syndrome (HCC) -     Hemoglobin A1c An individual care plan for diabetes was established and reinforced today.  The patient's status was assessed using clinical findings on exam, labs and diagnostic testing. Patient success at meeting goals based on disease specific evidence-based guidelines and found to be good controlled. Medications were assessed and patient's understanding of the medical issues , including barriers were assessed. Recommend adherence to a diabetic diet, a graduated exercise program, HgbA1c level is checked quarterly, and urine microalbumin performed yearly .  Annual mono-filament sensation testing performed. Lower blood pressure and control hyperlipidemia is important.  Get annual eye exams and annual flu shots and smoking cessation discussed.  Self management goals were discussed.   Gastroesophageal reflux disease without esophagitis Plan of care was formulated today.  he is doing well.  A plan of care was formulated using patient exam, tests and other sources to optimize care using evidence based information.  Recommend no smoking, no eating after supper, avoid fatty foods, elevate Head of bed, avoid tight fitting clothing.  Continue on nexium.   Pure hypercholesterolemia -     Lipid panel Check Lipid panel, may need treatment  Alcohol dependence with intoxication with complication [facility]) Patient is heavy alcohol user, we discussed cutting down     Orders Placed This Encounter  Procedures   Comprehensive metabolic panel   Hemoglobin A1c   Lipid panel   CBC with Differential/Platelet   30 minute visit, review of records and discussion of alcohol intake  Follow-up: Return in about 4 months (around 08/22/2021).  An After Visit Summary was printed and given to the patient.  Reinaldo Meeker, MD [facility] [phone]

## 2021-04-24 ENCOUNTER — Ambulatory Visit: Payer: Managed Care, Other (non HMO) | Admitting: Legal Medicine

## 2021-04-24 ENCOUNTER — Encounter: Payer: Self-pay | Admitting: Legal Medicine

## 2021-04-24 ENCOUNTER — Other Ambulatory Visit: Payer: Self-pay

## 2021-04-24 VITALS — BP 148/80 | HR 80 | Temp 96.5°F | Ht 68.5 in | Wt 201.0 lb

## 2021-04-24 DIAGNOSIS — I2510 Atherosclerotic heart disease of native coronary artery without angina pectoris: Secondary | ICD-10-CM | POA: Diagnosis not present

## 2021-04-24 DIAGNOSIS — I152 Hypertension secondary to endocrine disorders: Secondary | ICD-10-CM

## 2021-04-24 DIAGNOSIS — E669 Obesity, unspecified: Secondary | ICD-10-CM

## 2021-04-24 DIAGNOSIS — K219 Gastro-esophageal reflux disease without esophagitis: Secondary | ICD-10-CM | POA: Diagnosis not present

## 2021-04-24 DIAGNOSIS — E1159 Type 2 diabetes mellitus with other circulatory complications: Secondary | ICD-10-CM

## 2021-04-24 DIAGNOSIS — F10229 Alcohol dependence with intoxication, unspecified: Secondary | ICD-10-CM

## 2021-04-24 DIAGNOSIS — E7800 Pure hypercholesterolemia, unspecified: Secondary | ICD-10-CM

## 2021-04-24 DIAGNOSIS — I10 Essential (primary) hypertension: Secondary | ICD-10-CM

## 2021-04-24 DIAGNOSIS — E1169 Type 2 diabetes mellitus with other specified complication: Secondary | ICD-10-CM

## 2021-04-25 LAB — COMPREHENSIVE METABOLIC PANEL
ALT: 45 IU/L — ABNORMAL HIGH (ref 0–44)
AST: 54 IU/L — ABNORMAL HIGH (ref 0–40)
Albumin/Globulin Ratio: 2.2 (ref 1.2–2.2)
Albumin: 4.9 g/dL (ref 3.8–4.9)
Alkaline Phosphatase: 75 IU/L (ref 44–121)
BUN/Creatinine Ratio: 14 (ref 9–20)
BUN: 10 mg/dL (ref 6–24)
Bilirubin Total: 0.7 mg/dL (ref 0.0–1.2)
CO2: 22 mmol/L (ref 20–29)
Calcium: 9.7 mg/dL (ref 8.7–10.2)
Chloride: 102 mmol/L (ref 96–106)
Creatinine, Ser: 0.69 mg/dL — ABNORMAL LOW (ref 0.76–1.27)
Globulin, Total: 2.2 g/dL (ref 1.5–4.5)
Glucose: 115 mg/dL — ABNORMAL HIGH (ref 70–99)
Potassium: 4.5 mmol/L (ref 3.5–5.2)
Sodium: 141 mmol/L (ref 134–144)
Total Protein: 7.1 g/dL (ref 6.0–8.5)
eGFR: 109 mL/min/{1.73_m2} (ref >59)

## 2021-04-25 LAB — CBC WITH DIFFERENTIAL/PLATELET
Basophils Absolute: 0 10*3/uL (ref 0.0–0.2)
Basos: 1 %
EOS (ABSOLUTE): 0.2 10*3/uL (ref 0.0–0.4)
Eos: 4 %
Hematocrit: 41.3 % (ref 37.5–51.0)
Hemoglobin: 14.7 g/dL (ref 13.0–17.7)
Immature Grans (Abs): 0 10*3/uL (ref 0.0–0.1)
Immature Granulocytes: 0 %
Lymphocytes Absolute: 2.1 10*3/uL (ref 0.7–3.1)
Lymphs: 34 %
MCH: 32.8 pg (ref 26.6–33.0)
MCHC: 35.6 g/dL (ref 31.5–35.7)
MCV: 92 fL (ref 79–97)
Monocytes Absolute: 0.4 10*3/uL (ref 0.1–0.9)
Monocytes: 7 %
Neutrophils Absolute: 3.4 10*3/uL (ref 1.4–7.0)
Neutrophils: 54 %
Platelets: 177 10*3/uL (ref 150–450)
RBC: 4.48 x10E6/uL (ref 4.14–5.80)
RDW: 11.9 % (ref 11.6–15.4)
WBC: 6.2 10*3/uL (ref 3.4–10.8)

## 2021-04-25 LAB — LIPID PANEL
Chol/HDL Ratio: 3.8 ratio (ref 0.0–5.0)
Cholesterol, Total: 118 mg/dL (ref 100–199)
HDL: 31 mg/dL — ABNORMAL LOW (ref >39)
LDL Chol Calc (NIH): 58 mg/dL (ref 0–99)
Triglycerides: 170 mg/dL — ABNORMAL HIGH (ref 0–149)
VLDL Cholesterol Cal: 29 mg/dL (ref 5–40)

## 2021-04-25 LAB — HEMOGLOBIN A1C
Est. average glucose Bld gHb Est-mCnc: 137 mg/dL
Hgb A1c MFr Bld: 6.4 % — ABNORMAL HIGH (ref 4.8–5.6)

## 2021-04-25 LAB — CARDIOVASCULAR RISK ASSESSMENT

## 2021-04-25 NOTE — Progress Notes (Signed)
Glucose 115, kidney tests normal, liver tests high, probably from alcohol, A1c 6.4 Improved, triglycerides high 170, CBC normal lp

## 2021-05-23 ENCOUNTER — Ambulatory Visit
Admission: RE | Admit: 2021-05-23 | Discharge: 2021-05-23 | Disposition: A | Discharge status: Home / Self Care | Payer: Managed Care, Other (non HMO) | Source: Ambulatory Visit | Attending: Surgery | Admitting: Surgery

## 2021-05-23 ENCOUNTER — Ambulatory Visit: Payer: Managed Care, Other (non HMO) | Admitting: Surgery

## 2021-05-23 ENCOUNTER — Other Ambulatory Visit: Payer: Self-pay

## 2021-05-23 ENCOUNTER — Encounter: Payer: Self-pay | Admitting: Surgery

## 2021-05-23 VITALS — BP 168/89 | HR 62 | Resp 20 | Ht 68.0 in | Wt 205.0 lb

## 2021-05-23 DIAGNOSIS — Z952 Presence of prosthetic heart valve: Secondary | ICD-10-CM

## 2021-05-23 DIAGNOSIS — I712 Thoracic aortic aneurysm, without rupture, unspecified: Secondary | ICD-10-CM

## 2021-05-23 MED ORDER — IOPAMIDOL (ISOVUE-370) INJECTION 76%
75.0000 mL | Freq: Once | INTRAVENOUS | Status: AC | PRN
  Administered 2021-05-23: 75 mL via INTRAVENOUS

## 2021-05-27 NOTE — Progress Notes (Signed)
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  This pathogen is typically related to dental or periodontal infection and he was seen in the hospital preoperatively by Dr. Hoyt Koch who felt that his orthopantogram was negative and he did not have any acute problems that need to be addressed preoperatively. He has subsequently had all of his teeth extracted. He continues to feel well without complaints. He is back working and at full activity.  Current Outpatient Medications  Medication Sig Dispense Refill   aspirin EC 325 MG EC tablet Take 1 tablet (325 mg total) by mouth daily.     atorvastatin (LIPITOR) 80 MG tablet Take 1 tablet (80 mg total) by mouth daily. 90 tablet 3   docusate sodium (COLACE) 250 MG capsule Take 250 mg by mouth daily as needed for constipation.     esomeprazole (NEXIUM) 20 MG capsule Take 20 mg by mouth every evening.     ferrous Q000111Q C-folic acid (TRINSICON / FOLTRIN) capsule Take 1 capsule by mouth 2 (two) times daily after a meal. 60 capsule 2   metFORMIN (GLUCOPHAGE) 500 MG tablet Take 500 mg by mouth 2 (two) times daily with a meal.     metoprolol tartrate (LOPRESSOR) 25 MG tablet Take 0.5 tablets (12.5 mg total) by mouth 2 (two) times daily. 90 tablet 3   Omega-3 Fatty Acids (FISH OIL) 1000 MG CAPS Take 1,000 mg by mouth 2 (two) times daily.      No current facility-administered medications for this visit.     Physical Exam: BP (!) 168/89 (BP Location: Right Arm, Patient Position: Sitting)    Pulse 62    Resp 20    Ht 5\' 8"  (1.727 m)    Wt 205 lb (93 kg)    SpO2 98% Comment: RA   BMI 31.17 kg/m  He looks well. Cardiac exam shows regular rate and rhythm with normal heart  sounds. There is no murmur. Lungs are clear. The chest incision is well healed and the sternum is stable. There is no peripheral edema  Diagnostic Tests:  Narrative & Impression  CLINICAL DATA:  Follow-up TAA, status post Bentall graft repair of the ascending thoracic aorta   EXAM: CT ANGIOGRAPHY CHEST WITH CONTRAST   TECHNIQUE: Multidetector CT imaging of the chest was performed using the standard protocol during bolus administration of intravenous contrast. Multiplanar CT image reconstructions and MIPs were obtained to evaluate the vascular anatomy.   RADIATION DOSE REDUCTION: This exam was performed according to the departmental dose-optimization program which includes automated exposure control, adjustment of the mA and/or kV according to patient size and/or use of iterative reconstruction technique.   CONTRAST:  75mL ISOVUE-370 IOPAMIDOL (ISOVUE-370) INJECTION 76%   COMPARISON:  CT coronary angiogram, 09/29/2020 CT chest angiogram, 10/20/2019   FINDINGS: Cardiovascular: Preferential opacification of the thoracic aorta. Status post Bentall graft repair of the ascending thoracic aorta. Unchanged contour and caliber of the thoracic aorta. There has been interval replacement of a metallic valve prosthesis with aortic homograft. Minimal atherosclerosis of the native aorta. Incidental note of aberrant retroesophageal origin of the right subclavian artery. Normal heart size. Left coronary artery calcifications. No pericardial effusion.   Mediastinum/Nodes: No enlarged mediastinal, hilar, or axillary lymph nodes. Thyroid gland, trachea, and esophagus demonstrate no significant  findings.   Lungs/Pleura: Diffuse bilateral bronchial wall thickening. No pleural effusion or pneumothorax.   Upper Abdomen: No acute abnormality.   Musculoskeletal: No chest wall abnormality. No acute osseous findings.   Review of the MIP images confirms the above findings.   IMPRESSION: 1.  Interval revision of Bentall graft repair of the ascending thoracic aorta and aortic valve, with unchanged overall contour and caliber of the aorta. Minimal atherosclerosis of the native aorta. 2. Coronary artery disease. 3. Diffuse bilateral bronchial wall thickening, consistent with nonspecific infectious or inflammatory bronchitis.     Electronically Signed   By: Delanna Ahmadi M.D.   On: 05/23/2021 12:57      Impression:  He continues to feel well and has made a full recovery. His CTA chest looks good with no sign of infection or pseudoaneurysm at this point. I think he can be followed by Dr. Agustin Cree with periodic echos to assess homograft valve function. I don't think he needs another chest CTA unless some abnormality if seen on the echo or he has recurrent signs of infection. I reviewed the CTA images with him and his wife and answered their questions.   Plan:  He will continue to followup with Dr. Agustin Cree and Dr. Henrene Pastor and I will be happy to see him back if the need arises.  I spent 20 minutes performing this established patient evaluation and > 50% of this time was spent face to face counseling and coordinating the surveillance of his complicated aortic repair.   Gaye Pollack, MD [facility] [phone]

## 2021-06-24 IMAGING — CT CT HEART MORP W/ CTA COR W/ SCORE W/ CA W/CM &/OR W/O CM
2 of 8 series · 4 of 20 positions shown, 5 images · IV contrast (APPLIED)
Comparison: 10/20/2019
COMPARISON: 10/20/2019

Addendum:
EXAM:
OVER-READ INTERPRETATION  CT CHEST

The following report is an over-read performed by radiologist Dr. BRACK TIGER
Stefano [REDACTED] on 09/29/2020. This over-read
does not include interpretation of cardiac or coronary anatomy or
pathology. The coronary calcium score/coronary CTA interpretation by
the cardiologist is dictated separately.
CLINICAL DATA: Bacteremia, concern for perivalvular abscess.
TECHNIQUE: The patient was scanned on a Siemens Force [REDACTED]ice scanner. A 120
kV retrospective scan was triggered in the descending thoracic aorta
at 111 HU's. Gantry rotation speed was 270 msecs and collimation was
.9 mm. The 3D data set was reconstructed in 5% intervals of the R-R
cycle. Systolic and diastolic phases were analyzed on a dedicated
work station using MPR, MIP and VRT modes. The patient received
100mL OMNIPAQUE IOHEXOL 350 MG/ML SOLN of contrast.
Nitroglycerin could not be administered due to hypotension.

[Series 12: ts diast 83 % · axial · 0.42mm/px · z∈[-179,-103]mm · 2 of 569 slices shown]
[im 190/569  vessel]
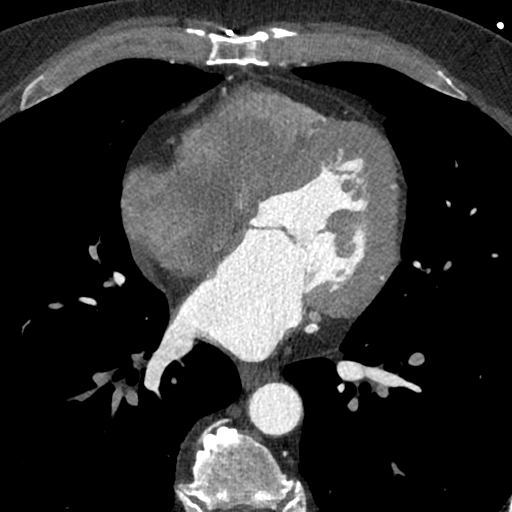
[im 379/569  vessel]
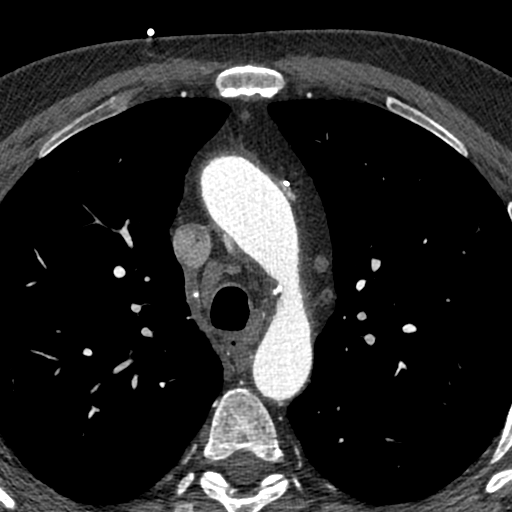

[Series 13: ts syst · axial · 0.42mm/px · z∈[-179,-103]mm · 2 of 569 slices shown, 3 images]
[im 190/569  vessel]
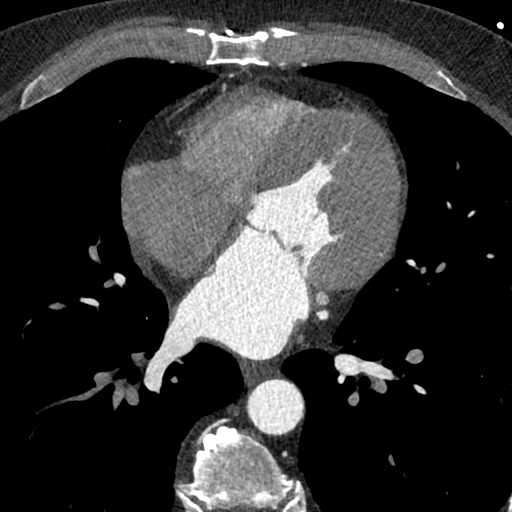
[im 190/569  lung]
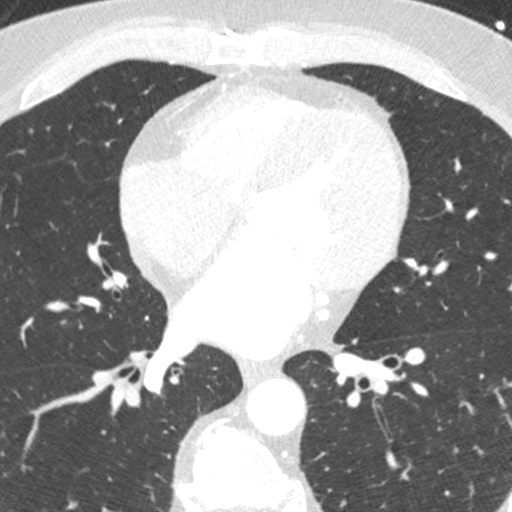
[im 379/569  vessel]
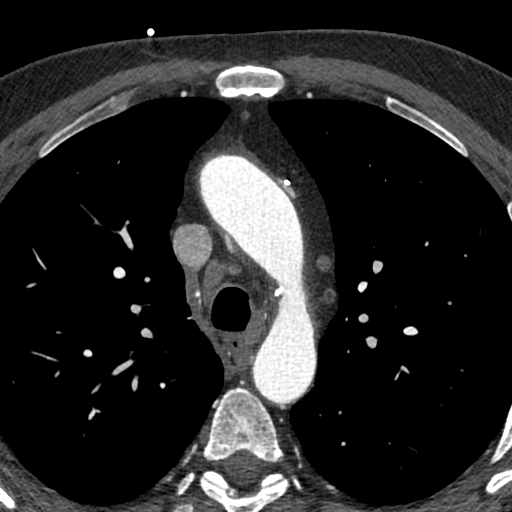

[4 of 20 positions shown; findings below may reference images not displayed]

FINDINGS: Vascular: Heart size normal. Fair contrast opacification of
pulmonary artery branches; the exam was not optimized for detection
of pulmonary emboli. Coronary calcifications. Previous AVR. Stable
ascending aortic tube graft repair. Good contrast opacification of
the thoracic aorta without dissection, aneurysm, or stenosis. Common
origin of common carotid arteries with some eccentric partially
calcified plaque, no high-grade stenosis. Left subclavian artery
widely patent. Aberrant origin of the right subclavian artery which
takes a retroesophageal course.

Mediastinum/Nodes: No mass or adenopathy.

Lungs/Pleura: No pleural effusion. No pneumothorax. Lungs are clear.

Upper Abdomen: No acute findings.

Musculoskeletal: Sternotomy wires. Anterior vertebral endplate
spurring at multiple levels in the lower thoracic spine. No fracture
or worrisome bone lesion.
IMPRESSION: 1. No acute findings.
2. Stable changes of Bentall repair.
3. Coronary and Aortic Atherosclerosis (E17V5-170.0).
4. Aberrant origin of right subclavian artery, an anatomic variant.
55 yo male with bacteremia and fevers, with history of bicuspid
aortic valve stenosis and ascending aortic aneurysm, s/p Biological
Bentall Procedure using a 25 mm Hussainoo Sinta pericardial valve
and a 28 mm Gelweave Valsalva graft, as well as replacement of the
ascending aorta (hemi-arch) using a 28 mm Hemashield graft on
09/10/2018 with Dr. Adronis.

COMPARISON
INTRAOPERATIVE QUIRIJN 09/10/2018, CTA CHEST AORTA 10/20/19, TRANSTHORACIC
ECHO 04/03/20.

EXAM:
Cardiac ECG gated CT angiogram
FINDINGS: AORTIC VALVE:

There is a bioprosthesis in the aortic position, as part of a valved
conduit.

Posterior to the valve, there is a 3 x 3 x 3 cm area of mixed
attenuation, with low density centrally and relative peripheral
enhancement, consistent with perivalvular abscess. This finding was
not present on CTA chest performed 10/20/19, and is not seen on
immediately Edris Shahid. There is no contrast opacification
of this space, and no evidence of peri-graft pseudoaneurysm or
fistula. The prosthetic valve leaflets are normal with normal
excursion.

AORTA:

The sinus of Valsalva and ascending aorta have been replaced. There
is thickening and fluid density consistent with extension of above
noted periprosthetic abscess. The ascending aorta graft otherwise
appears stable. The ascending aorta distal to the graft is normal
caliber, 33 mm at distal ascending aorta just before the common
origin of common carotid arteries.

CORONARY ARTERIES: Study was not performed to evaluate coronary
arteries. Nitroglycerin was not given due to hypotension.

The left main coronary artery is encircled by mixed attenuation mass
consistent with abscess. No communication with coronary artery seen.
LM is free of disease and is not compressed by abscess.

Left dominant circulation. Calcifications in LAD and L circumflex.
Proximal LAD and proximal L circumflex do not appear to have severe
stenosis. RCA is small and appears patent.

OTHER:

Mild dilation of main pulmonary artery, 30 mm.

No left atrial appendage thrombus.

Normal pulmonary vein drainage into the left atrium.
IMPRESSION: 1. Findings consistent with perivalvular abscess posterior to the
aortic valve bioprosthesis. The left main coronary artery is patent
and traverses this area of mixed attenuation without compression of
the left main coronary artery.

Findings communicated to the cardiology consult service. Recommend
cardiac surgery consult.

*** End of Addendum ***
EXAM:
OVER-READ INTERPRETATION  CT CHEST

The following report is an over-read performed by radiologist Dr. BRACK TIGER
Stefano [REDACTED] on 09/29/2020. This over-read
does not include interpretation of cardiac or coronary anatomy or
pathology. The coronary calcium score/coronary CTA interpretation by
the cardiologist is dictated separately.
FINDINGS: Vascular: Heart size normal. Fair contrast opacification of
pulmonary artery branches; the exam was not optimized for detection
of pulmonary emboli. Coronary calcifications. Previous AVR. Stable
ascending aortic tube graft repair. Good contrast opacification of
the thoracic aorta without dissection, aneurysm, or stenosis. Common
origin of common carotid arteries with some eccentric partially
calcified plaque, no high-grade stenosis. Left subclavian artery
widely patent. Aberrant origin of the right subclavian artery which
takes a retroesophageal course.

Mediastinum/Nodes: No mass or adenopathy.

Lungs/Pleura: No pleural effusion. No pneumothorax. Lungs are clear.

Upper Abdomen: No acute findings.

Musculoskeletal: Sternotomy wires. Anterior vertebral endplate
spurring at multiple levels in the lower thoracic spine. No fracture
or worrisome bone lesion.
IMPRESSION: 1. No acute findings.
2. Stable changes of Bentall repair.
3. Coronary and Aortic Atherosclerosis (E17V5-170.0).
4. Aberrant origin of right subclavian artery, an anatomic variant.

## 2021-06-25 IMAGING — DX DG ORTHOPANTOGRAM /PANORAMIC
1 series · 1 of 1 positions shown · non-contrast
Comparison: None.

CLINICAL DATA: Dental caries.  No pain at this time.

EXAM:
ORTHOPANTOGRAM/PANORAMIC

[view not recorded]
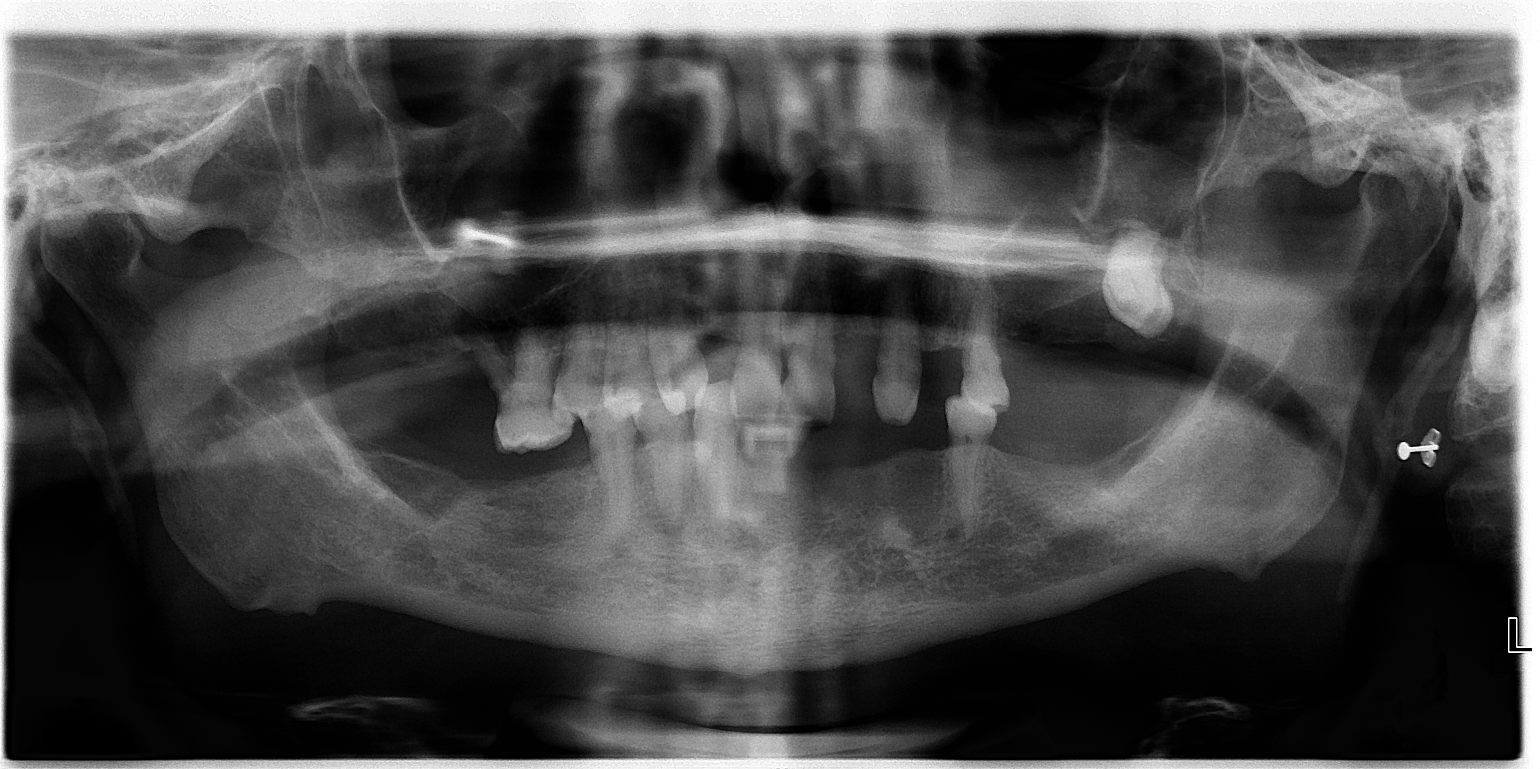

[1 of 1 positions shown; findings below may reference images not displayed]

FINDINGS: Multiple lower teeth and several upper teeth are absent. No evidence
of fracture. No focal lytic or sclerotic lesion.
IMPRESSION: No acute findings.

## 2021-06-30 IMAGING — DX DG CHEST 1V PORT
1 series · 1 of 1 positions shown · non-contrast
Comparison: October 04, 2020

CLINICAL DATA: Hypoxia

EXAM:
PORTABLE CHEST 1 VIEW

[chest]
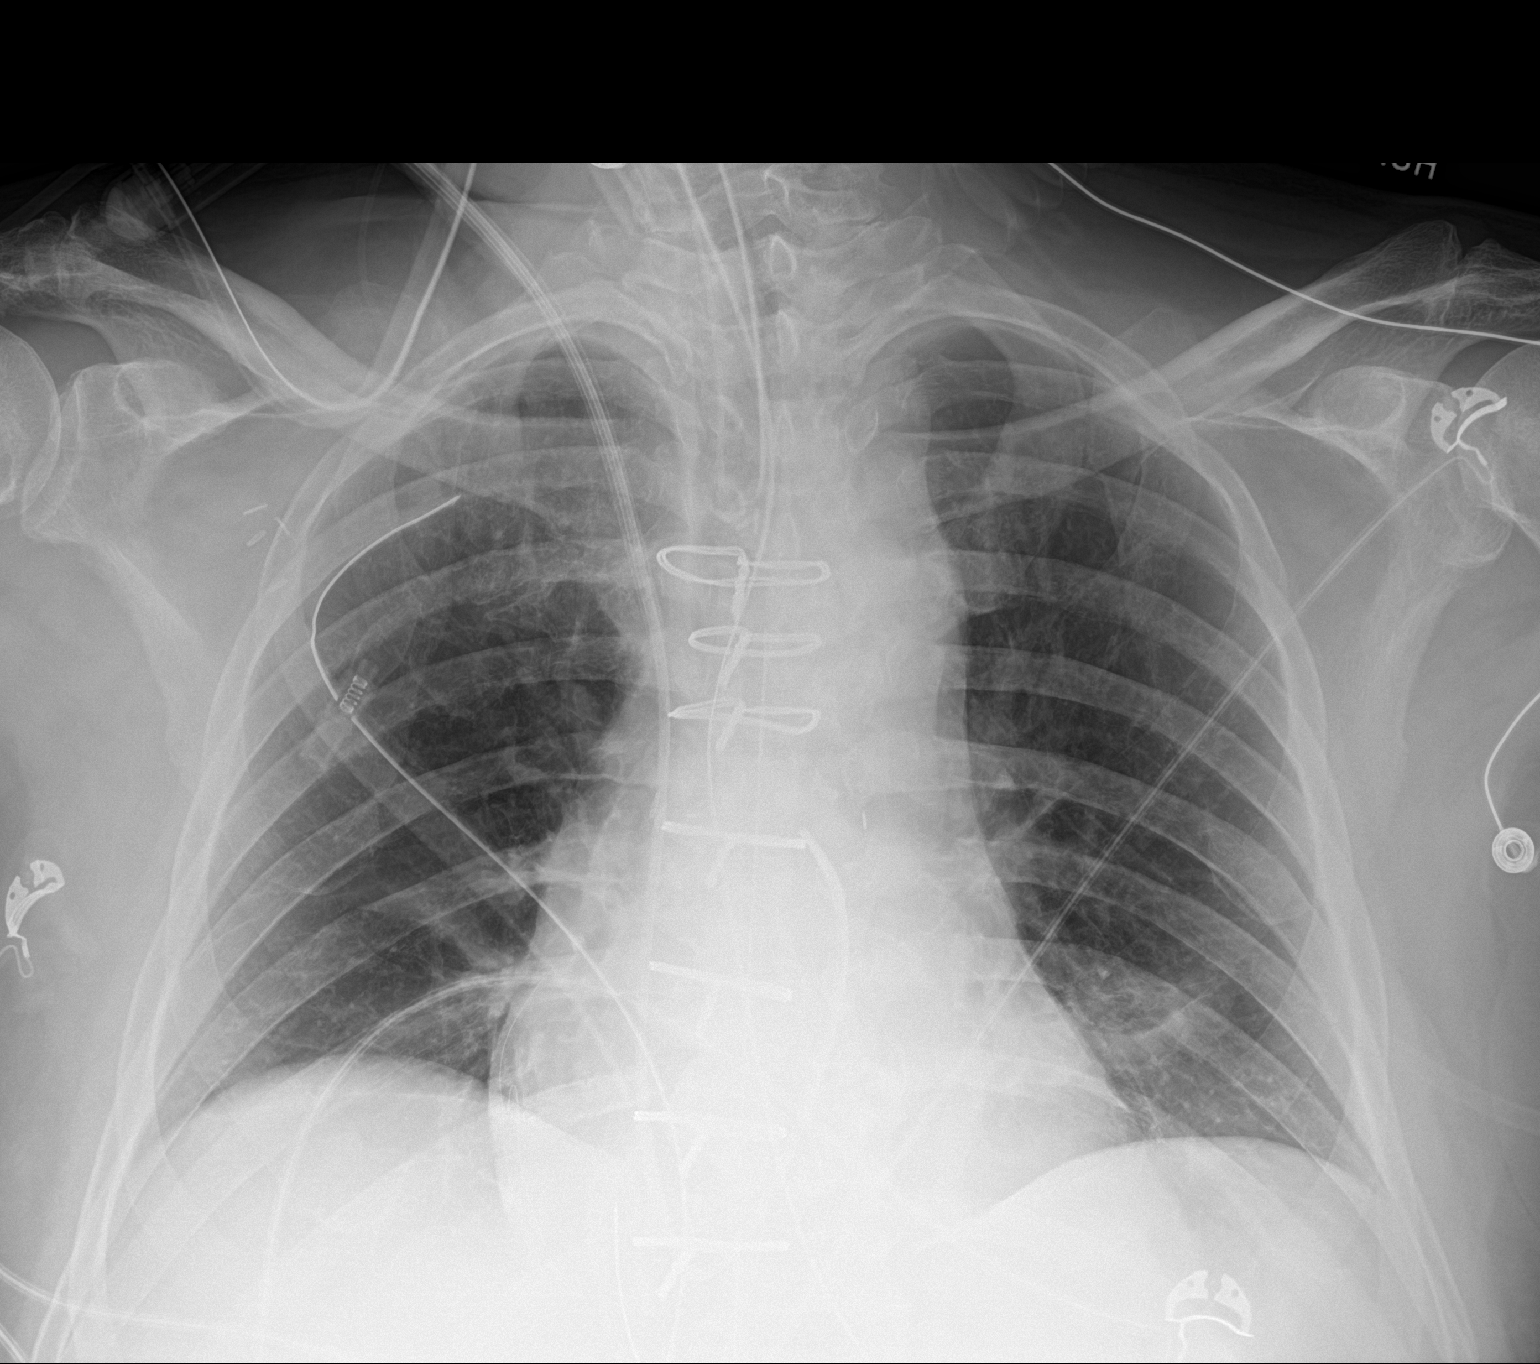

[1 of 1 positions shown; findings below may reference images not displayed]

FINDINGS: Endotracheal tube tip is 5.3 cm above the carina. Nasogastric tube
tip and side port are below the diaphragm. Swan-Ganz catheter tip is
in the main pulmonary outflow track slightly beyond the pulmonic
valve. There is a chest tube on the right. Temporary pacemaker wires
are attached to the right heart. There is also a mediastinal drain.
No pneumothorax. There is bibasilar atelectasis. Lungs elsewhere
clear. Heart is upper normal in size with pulmonary vascularity
normal. No adenopathy. No bone lesions.
IMPRESSION: Tube and catheter positions as described without evident
pneumothorax. Bibasilar atelectasis. Heart upper normal in size.

## 2021-08-28 NOTE — Progress Notes (Deleted)
? ?Subjective:  ?Patient ID: Ronnie Ruiz, male    DOB: Apr 29, 1965  Age: 56 y.o. MRN: [id number] ? ?No chief complaint on file. ? ? ?HPI ?Patient presents with hyperlipidemia.  Compliance with treatment has been good; patient takes medicines as directed, maintains low cholesterol diet, follows up as directed, and maintains exercise regimen.  Patient is using Atorvastatin 80 mg daily, Fish Oil 1000 mg twice a day without problems.  ? ?Patient present with type 2 diabetes. Compliance with treatment has been good; patient take medicines as directed, maintains diet and exercise regimen, follows up as directed, and is keeping glucose diary.  Current medicines for diabetes Metformin 500 mg daily. Patient performs foot exams daily and last ophthalmologic exam was few years ago. Last A1C was 6.4%. ? ?Hypertension: He is taking Metoprolol 25 mg twice a day, aspirin 325 mg daily. ?  ?Current Outpatient Medications on File Prior to Visit  ?Medication Sig Dispense Refill  ? aspirin EC 325 MG EC tablet Take 1 tablet (325 mg total) by mouth daily.    ? atorvastatin (LIPITOR) 80 MG tablet Take 1 tablet (80 mg total) by mouth daily. 90 tablet 3  ? docusate sodium (COLACE) 250 MG capsule Take 250 mg by mouth daily as needed for constipation.    ? esomeprazole (NEXIUM) 20 MG capsule Take 20 mg by mouth every evening.    ? ferrous Q000111Q C-folic acid (TRINSICON / FOLTRIN) capsule Take 1 capsule by mouth 2 (two) times daily after a meal. 60 capsule 2  ? metFORMIN (GLUCOPHAGE) 500 MG tablet Take 500 mg by mouth 2 (two) times daily with a meal.    ? metoprolol tartrate (LOPRESSOR) 25 MG tablet Take 0.5 tablets (12.5 mg total) by mouth 2 (two) times daily. 90 tablet 3  ? Omega-3 Fatty Acids (FISH OIL) 1000 MG CAPS Take 1,000 mg by mouth 2 (two) times daily.     ? ?No current facility-administered medications on file prior to visit.  ? ?Past Medical History:  ?Diagnosis Date  ? Alcohol dependency ([place]) 06/24/2018  ?  Alcoholism ([place]) 06/24/2018  ? Aortic stenosis   ? Aortic valve abscess   ? Ascending aortic aneurysm ([facility]) 41 mm as measured by echo 07/02/2018  ? Atherosclerotic heart disease 06/24/2018  ? Bacteremia 09/26/2020  ? Bicuspid aortic valve 06/25/2018  ? Critical aortic valve stenosis mean gradient 64 mmHg, 06/25/2018  ? Dental caries   ? Diabetes mellitus type 2, controlled ([place]) 06/24/2018  ? Dyspnea   ? Endocarditis 09/26/2020  ? Essential hypertension 06/24/2018  ? Fatigue 09/15/2020  ? GERD (gastroesophageal reflux disease) 06/24/2018  ? Heart murmur 06/24/2018  ? Hyperlipidemia 06/24/2018  ? Night sweats 09/15/2020  ? Obesity, diabetes, and hypertension syndrome ([place]) 02/28/2020  ? Protein-calorie malnutrition, severe 09/28/2020  ? S/P aortic valve replacement and aortoplasty 10/04/2020  ? S/P AVR 09/22/2018  ? Biological Bentall Procedure using a 25 mm Edwards Magna-Ease pericardial valve and a 28 mm Gelweave Valsalva graft.  ? Secondary adhesive capsulitis of left shoulder 08/31/2020  ? Thoracic ascending aortic aneurysm   ? ?Past Surgical History:  ?Procedure Laterality Date  ? BENTALL PROCEDURE N/A 09/10/2018  ? Procedure: BENTALL PROCEDURE USING HEMASHIELD 28, GELWEAVE VALSALVA 28MM, AND MAGNA EASE 25MM;  Surgeon: Gaye Pollack, MD;  Location: [facility];  Service: Open Heart Surgery;  Laterality: N/A;  ? BENTALL PROCEDURE N/A 10/04/2020  ? Procedure: REDO BENTALL PROCEDURE ON PUMP WITH A 28MM HEMASHIELD PLATINUM GRAFT AND 23MM HOMOGRAFT;  Surgeon:  Gaye Pollack, MD;  Location[facility];  Service: Open Heart Surgery;  Laterality: N/A;  AXILLARY CANNULATION WITH 8MM GRAFT  ? BUBBLE STUDY  09/28/2020  ? Procedure: BUBBLE STUDY;  Surgeon: Elouise Munroe, MD;  Location: [facility];  Service: Cardiology;;  ? CHOLECYSTECTOMY    ? ENDOVEIN HARVEST OF GREATER SAPHENOUS VEIN Right 10/04/2020  ? Procedure: ENDOVEIN HARVEST OF GREATER SAPHENOUS VEIN;  Surgeon: Gaye Pollack, MD;  Location: [facility];  Service: Open Heart Surgery;  Laterality: Right;   ? Heart valve replaced revision  2022  ? RIGHT HEART CATH AND CORONARY ANGIOGRAPHY N/A 07/07/2018  ? Procedure: RIGHT HEART CATH AND CORONARY ANGIOGRAPHY;  Surgeon: Troy Sine, MD;  Location: [facility];  Service: Cardiovascular;  Laterality: N/A;  ? TEE WITHOUT CARDIOVERSION N/A 09/10/2018  ? Procedure: TRANSESOPHAGEAL ECHOCARDIOGRAM (TEE);  Surgeon: Gaye Pollack, MD;  Location: [facility];  Service: Open Heart Surgery;  Laterality: N/A;  ? TEE WITHOUT CARDIOVERSION N/A 09/28/2020  ? Procedure: TRANSESOPHAGEAL ECHOCARDIOGRAM (TEE);  Surgeon: Elouise Munroe, MD;  Location: [facility];  Service: Cardiology;  Laterality: N/A;  ? TEE WITHOUT CARDIOVERSION N/A 10/04/2020  ? Procedure: TRANSESOPHAGEAL ECHOCARDIOGRAM (TEE);  Surgeon: Gaye Pollack, MD;  Location: [facility];  Service: Open Heart Surgery;  Laterality: N/A;  ?  ?Family History  ?Problem Relation Age of Onset  ? Lung cancer Mother   ? Pulmonary disease Mother   ? Alcoholism Father   ? Anxiety disorder Father   ? Hyperlipidemia Father   ? Lung cancer Father   ? CAD Father   ? Renal cancer Father   ? Pulmonary disease Father   ? ?Social History  ? ?Socioeconomic History  ? Marital status: Married  ?  Spouse name: Not on file  ? Number of children: Not on file  ? Years of education: Not on file  ? Highest education level: Not on file  ?Occupational History  ? Not on file  ?Tobacco Use  ? Smoking status: Never  ? Smokeless tobacco: Former  ?  Types: Snuff  ?Vaping Use  ? Vaping Use: Never used  ?Substance and Sexual Activity  ? Alcohol use: Not Currently  ?  Alcohol/week: 2.0 standard drinks  ?  Types: 2 Cans of beer per week  ?  Comment: daily  ? Drug use: Never  ? Sexual activity: Not Currently  ?Other Topics Concern  ? Not on file  ?Social History Narrative  ? Not on file  ? ?Social Determinants of Health  ? ?Financial Resource Strain: Not on file  ?Food Insecurity: Not on file  ?Transportation Needs: Not on file  ?Physical Activity: Not on  file  ?Stress: Not on file  ?Social Connections: Not on file  ? ? ?Review of Systems ? ? ?Objective:  ?There were no vitals taken for this visit. ? ? ?  05/23/2021  ? 12:47 PM 04/24/2021  ?  9:13 AM 04/12/2021  ?  8:43 AM  ?BP/Weight  ?Systolic BP [id number]  ?Diastolic BP 89 80 70  ?Wt. (Lbs) 205 201 204.8  ?BMI 31.17 kg/m2 30.12 kg/m2 30.6 kg/m2  ? ? ?Physical Exam ? ?Diabetic Foot Exam - Simple   ?No data filed ?  ?  ? ?Lab Results  ?Component Value Date  ? WBC 6.2 04/24/2021  ? HGB 14.7 04/24/2021  ? HCT 41.3 04/24/2021  ? PLT 177 04/24/2021  ? GLUCOSE 115 (H) 04/24/2021  ? CHOL 118 04/24/2021  ?  TRIG 170 (H) 04/24/2021  ? HDL 31 (L) 04/24/2021  ? [place] 58 04/24/2021  ? ALT 45 (H) 04/24/2021  ? AST 54 (H) 04/24/2021  ? NA 141 04/24/2021  ? K 4.5 04/24/2021  ? CL 102 04/24/2021  ? CREATININE 0.69 (L) 04/24/2021  ? BUN 10 04/24/2021  ? CO2 22 04/24/2021  ? INR 1.4 (H) 10/04/2020  ? HGBA1C 6.4 (H) 04/24/2021  ? ? ? ? ?Assessment & Plan:  ? ?Problem List Items Addressed This Visit   ? ?  ? Cardiovascular and Mediastinum  ? Essential hypertension - Primary  ? Atherosclerotic heart disease  ? Critical aortic valve stenosis mean gradient 64 mmHg,  ? Obesity, diabetes, and hypertension syndrome ([place])  ?  ? Digestive  ? GERD (gastroesophageal reflux disease)  ?  ? Other  ? Hyperlipidemia  ? Protein-calorie malnutrition, severe  ?. ? ?No orders of the defined types were placed in this encounter. ? ? ?No orders of the defined types were placed in this encounter. ?  ? ?Follow-up: No follow-ups on file. ? ?An After Visit Summary was printed and given to the patient. ? ?Reinaldo Meeker, MD ?[facility] ?[phone]

## 2021-08-29 ENCOUNTER — Ambulatory Visit: Payer: Managed Care, Other (non HMO) | Admitting: Physician Assistant

## 2021-08-29 ENCOUNTER — Other Ambulatory Visit: Payer: Self-pay | Admitting: Legal Medicine

## 2021-08-29 ENCOUNTER — Encounter: Payer: Self-pay | Admitting: Physician Assistant

## 2021-08-29 VITALS — BP 132/80 | HR 67 | Temp 97.5°F | Ht 68.0 in | Wt 215.2 lb

## 2021-08-29 DIAGNOSIS — E7800 Pure hypercholesterolemia, unspecified: Secondary | ICD-10-CM

## 2021-08-29 DIAGNOSIS — I10 Essential (primary) hypertension: Secondary | ICD-10-CM | POA: Diagnosis not present

## 2021-08-29 DIAGNOSIS — K219 Gastro-esophageal reflux disease without esophagitis: Secondary | ICD-10-CM | POA: Diagnosis not present

## 2021-08-29 DIAGNOSIS — I152 Hypertension secondary to endocrine disorders: Secondary | ICD-10-CM

## 2021-08-29 DIAGNOSIS — I2510 Atherosclerotic heart disease of native coronary artery without angina pectoris: Secondary | ICD-10-CM | POA: Diagnosis not present

## 2021-08-29 DIAGNOSIS — Z125 Encounter for screening for malignant neoplasm of prostate: Secondary | ICD-10-CM | POA: Insufficient documentation

## 2021-08-29 DIAGNOSIS — E1169 Type 2 diabetes mellitus with other specified complication: Secondary | ICD-10-CM

## 2021-08-29 DIAGNOSIS — Z23 Encounter for immunization: Secondary | ICD-10-CM | POA: Diagnosis not present

## 2021-08-29 DIAGNOSIS — E43 Unspecified severe protein-calorie malnutrition: Secondary | ICD-10-CM

## 2021-08-29 DIAGNOSIS — E669 Obesity, unspecified: Secondary | ICD-10-CM

## 2021-08-29 DIAGNOSIS — E1159 Type 2 diabetes mellitus with other circulatory complications: Secondary | ICD-10-CM

## 2021-08-29 DIAGNOSIS — I350 Nonrheumatic aortic (valve) stenosis: Secondary | ICD-10-CM

## 2021-08-29 DIAGNOSIS — Z1211 Encounter for screening for malignant neoplasm of colon: Secondary | ICD-10-CM | POA: Insufficient documentation

## 2021-08-29 NOTE — Assessment & Plan Note (Signed)
PSA pending

## 2021-08-29 NOTE — Assessment & Plan Note (Signed)
Diet/exercise/weight loss ?Continue meds ?labwork pending ?

## 2021-08-29 NOTE — Assessment & Plan Note (Signed)
Continue current meds ?Heart healthy diet ?labwork pending ?

## 2021-08-29 NOTE — Progress Notes (Signed)
? ?Subjective:  ?Patient ID: Ronnie Ruiz, male    DOB: 1965-11-24  Age: 56 y.o. MRN: [id number] ? ?Chief Complaint  ?Patient presents with  ? Hyperlipidemia  ? Hypertension  ? ? ?Pt with history of hypertension -The patient is tolerating the medication well without side effects. Compliance with treatment has been good; including taking medication as directed , and following up as directed. He is currently on lopressor 25mg  ? ?Pt has known history of hyperlipidemia as well as CAD.  He is currently on ASA 325mg  qd fish oil, and lipitor 80mg  qd.  He follows with Dr for history of aortic valve and ascending aorta repair (history of aortic valve stenosis and aneurysm)  He has appt with his office in the next month.  He has been released by his surgeon Dr and advised to follow with echocardiogram imaging. ? ?Pt with history of GERD - states nexium 20mg  controls symptoms well for him ? ?Pt with history of NIDDM - currently he is on metformin 500mg .  He is due for eye appt and will schedule that.  He has been checking his glucose randomly with readings ranging from 120-200.   ? ?Pt would like to update tetanus booster ?Pt would like to get cologuard ?Current Outpatient Medications on File Prior to Visit  ?Medication Sig Dispense Refill  ? aspirin EC 325 MG EC tablet Take 1 tablet (325 mg total) by mouth daily.    ? atorvastatin (LIPITOR) 80 MG tablet Take 1 tablet (80 mg total) by mouth daily. 90 tablet 3  ? docusate sodium (COLACE) 250 MG capsule Take 250 mg by mouth daily as needed for constipation.    ? esomeprazole (NEXIUM) 20 MG capsule Take 20 mg by mouth every evening.    ? ferrous fumarate-b12-vitamic C-folic acid (TRINSICON / FOLTRIN) capsule Take 1 capsule by mouth 2 (two) times daily after a meal. 60 capsule 2  ? metFORMIN (GLUCOPHAGE) 500 MG tablet Take 500 mg by mouth 2 (two) times daily with a meal.    ? metoprolol tartrate (LOPRESSOR) 25 MG tablet Take 0.5 tablets (12.5 mg total) by mouth 2  (two) times daily. 90 tablet 3  ? Omega-3 Fatty Acids (FISH OIL) 1000 MG CAPS Take 1,000 mg by mouth 2 (two) times daily.     ? ?No current facility-administered medications on file prior to visit.  ? ?Past Medical History:  ?Diagnosis Date  ? Alcohol dependency (HCC) 06/24/2018  ? Alcoholism (HCC) 06/24/2018  ? Aortic stenosis   ? Aortic valve abscess   ? Ascending aortic aneurysm (HCC) 41 mm as measured by echo 07/02/2018  ? Atherosclerotic heart disease 06/24/2018  ? Bacteremia 09/26/2020  ? Bicuspid aortic valve 06/25/2018  ? Critical aortic valve stenosis mean gradient 64 mmHg, 06/25/2018  ? Dental caries   ? Diabetes mellitus type 2, controlled (HCC) 06/24/2018  ? Dyspnea   ? Endocarditis 09/26/2020  ? Essential hypertension 06/24/2018  ? Fatigue 09/15/2020  ? GERD (gastroesophageal reflux disease) 06/24/2018  ? Heart murmur 06/24/2018  ? Hyperlipidemia 06/24/2018  ? Night sweats 09/15/2020  ? Obesity, diabetes, and hypertension syndrome (HCC) 02/28/2020  ? Protein-calorie malnutrition, severe 09/28/2020  ? S/P aortic valve replacement and aortoplasty 10/04/2020  ? S/P AVR 09/22/2018  ? Biological Bentall Procedure using a 25 mm Edwards Magna-Ease pericardial valve and a 28 mm Gelweave Valsalva graft.  ? Secondary adhesive capsulitis of left shoulder 08/31/2020  ? Thoracic ascending aortic aneurysm (HCC)   ? ?Past Surgical History:  ?  Procedure Laterality Date  ? BENTALL PROCEDURE N/A 09/10/2018  ? Procedure: BENTALL PROCEDURE USING HEMASHIELD 28, GELWEAVE VALSALVA , AND MAGNA EASE ;  Surgeon: [name], MD;  Location[facility];  Service: Open Heart Surgery;  Laterality: N/A;  ? BENTALL PROCEDURE N/A 10/04/2020  ? Procedure: REDO BENTALL PROCEDURE ON PUMP WITH A HEMASHIELD PLATINUM GRAFT AND HOMOGRAFT;  Surgeon: [name], MD;  Location: [facility] OR;  Service: Open Heart Surgery;  Laterality: N/A;  AXILLARY CANNULATION WITH GRAFT  ? BUBBLE STUDY  09/28/2020  ? Procedure: BUBBLE STUDY;  Surgeon: Parke Poisson, MD;   Location: [facility] ENDOSCOPY;  Service: Cardiology;;  ? CHOLECYSTECTOMY    ? ENDOVEIN HARVEST OF GREATER SAPHENOUS VEIN Right 10/04/2020  ? Procedure: ENDOVEIN HARVEST OF GREATER SAPHENOUS VEIN;  Surgeon: [name], MD;  Location: [facility] OR;  Service: Open Heart Surgery;  Laterality: Right;  ? Heart valve replaced revision  2022  ? RIGHT HEART CATH AND CORONARY ANGIOGRAPHY N/A 07/07/2018  ? Procedure: RIGHT HEART CATH AND CORONARY ANGIOGRAPHY;  Surgeon: Lennette Bihari, MD;  Location: [facility] INVASIVE CV LAB;  Service: Cardiovascular;  Laterality: N/A;  ? TEE WITHOUT CARDIOVERSION N/A 09/10/2018  ? Procedure: TRANSESOPHAGEAL ECHOCARDIOGRAM (TEE);  Surgeon: [name], MD;  Location: [facility] OR;  Service: Open Heart Surgery;  Laterality: N/A;  ? TEE WITHOUT CARDIOVERSION N/A 09/28/2020  ? Procedure: TRANSESOPHAGEAL ECHOCARDIOGRAM (TEE);  Surgeon: Parke Poisson, MD;  Location: [facility] ENDOSCOPY;  Service: Cardiology;  Laterality: N/A;  ? TEE WITHOUT CARDIOVERSION N/A 10/04/2020  ? Procedure: TRANSESOPHAGEAL ECHOCARDIOGRAM (TEE);  Surgeon: [name], MD;  Location: [facility] OR;  Service: Open Heart Surgery;  Laterality: N/A;  ?  ?Family History  ?Problem Relation Age of Onset  ? Lung cancer Mother   ? Pulmonary disease Mother   ? Alcoholism Father   ? Anxiety disorder Father   ? Hyperlipidemia Father   ? Lung cancer Father   ? CAD Father   ? Renal cancer Father   ? Pulmonary disease Father   ? ?Social History  ? ?Socioeconomic History  ? Marital status: Married  ?  Spouse name: Not on file  ? Number of children: Not on file  ? Years of education: Not on file  ? Highest education level: Not on file  ?Occupational History  ? Not on file  ?Tobacco Use  ? Smoking status: Never  ? Smokeless tobacco: Former  ?  Types: Snuff  ?Vaping Use  ? Vaping Use: Never used  ?Substance and Sexual Activity  ? Alcohol use: Not Currently  ?  Alcohol/week: 2.0 standard drinks  ?  Types: 2 Cans of beer per week  ?  Comment: daily  ? Drug use: Never   ? Sexual activity: Not Currently  ?Other Topics Concern  ? Not on file  ?Social History Narrative  ? Not on file  ? ?Social Determinants of Health  ? ?Financial Resource Strain: Not on file  ?Food Insecurity: Not on file  ?Transportation Needs: Not on file  ?Physical Activity: Not on file  ?Stress: Not on file  ?Social Connections: Not on file  ? ? ?Review of Systems ?CONSTITUTIONAL: Negative for chills, fatigue, fever, unintentional weight gain and unintentional weight loss.  ?E/N/T: Negative for ear pain, nasal congestion and sore throat.  ?CARDIOVASCULAR: Negative for chest pain, dizziness, palpitations and pedal edema.  ?RESPIRATORY: Negative for recent cough and dyspnea.  ?GASTROINTESTINAL: Negative for abdominal pain, acid reflux symptoms, constipation, diarrhea,  nausea and vomiting.  ?MSK: Negative for arthralgias and myalgias.  ?INTEGUMENTARY: Negative for rash.  ?NEUROLOGICAL: Negative for dizziness and headaches.  ?PSYCHIATRIC: Negative for sleep disturbance and to question depression screen.  Negative for depression, negative for anhedonia.  ?   ? ? ?Objective:  ?BP 132/80   Pulse 67   Temp (!) 97.5 ?F (36.4 ?C)   Ht 5\' 8"  (1.727 m)   Wt 215 lb 3.2 oz (97.6 kg)   SpO2 97%   BMI 32.72 kg/m?  ? ? ?  08/29/2021  ?  9:20 AM 05/23/2021  ? 12:47 PM 04/24/2021  ?  9:13 AM  ?BP/Weight  ?Systolic BP 132 168 148  ?Diastolic BP 80 89 80  ?Wt. (Lbs) 215.2 205 201  ?BMI 32.72 kg/m2 31.17 kg/m2 30.12 kg/m2  ?PHYSICAL EXAM:  ? ?VS: BP 132/80   Pulse 67   Temp (!) 97.5 ?F (36.4 ?C)   Ht 5\' 8"  (1.727 m)   Wt 215 lb 3.2 oz (97.6 kg)   SpO2 97%   BMI 32.72 kg/m?  ? ?GEN: Well nourished, well developed, in no acute distress  ?Cardiac: RRR; no murmurs, rubs, or gallops,no edema -  ?Respiratory:  normal respiratory rate and pattern with no distress - normal breath sounds with no rales, rhonchi, wheezes or rubs ?GI: normal bowel sounds, no masses or tenderness ?MS: no deformity or atrophy  ?Skin: warm and dry, no rash   ?Psych: euthymic mood, appropriate affect and demeanor ? ?Lab Results  ?Component Value Date  ? WBC 6.2 04/24/2021  ? HGB 14.7 04/24/2021  ? HCT 41.3 04/24/2021  ? PLT 177 04/24/2021  ? GLUCOSE 115 (H) 04/24/2021

## 2021-08-29 NOTE — Assessment & Plan Note (Signed)
Continue meds  ?labwork pending ?

## 2021-08-29 NOTE — Telephone Encounter (Signed)
Refill sent to pharmacy.   

## 2021-08-29 NOTE — Assessment & Plan Note (Signed)
Continue nexium 

## 2021-08-29 NOTE — Assessment & Plan Note (Signed)
cologuard ordered.

## 2021-08-29 NOTE — Assessment & Plan Note (Signed)
Tdap given.  

## 2021-08-30 LAB — COMPREHENSIVE METABOLIC PANEL
ALT: 37 IU/L (ref 0–44)
AST: 41 IU/L — ABNORMAL HIGH (ref 0–40)
Albumin/Globulin Ratio: 2.6 — ABNORMAL HIGH (ref 1.2–2.2)
Albumin: 5 g/dL — ABNORMAL HIGH (ref 3.8–4.9)
Alkaline Phosphatase: 64 IU/L (ref 44–121)
BUN/Creatinine Ratio: 11 (ref 9–20)
BUN: 8 mg/dL (ref 6–24)
Bilirubin Total: 0.8 mg/dL (ref 0.0–1.2)
CO2: 22 mmol/L (ref 20–29)
Calcium: 9.8 mg/dL (ref 8.7–10.2)
Chloride: 98 mmol/L (ref 96–106)
Creatinine, Ser: 0.73 mg/dL — ABNORMAL LOW (ref 0.76–1.27)
Globulin, Total: 1.9 g/dL (ref 1.5–4.5)
Glucose: 106 mg/dL — ABNORMAL HIGH (ref 70–99)
Potassium: 4.4 mmol/L (ref 3.5–5.2)
Sodium: 137 mmol/L (ref 134–144)
Total Protein: 6.9 g/dL (ref 6.0–8.5)
eGFR: 106 mL/min/{1.73_m2} (ref >59)

## 2021-08-30 LAB — CBC WITH DIFFERENTIAL/PLATELET
Basophils Absolute: 0.1 10*3/uL (ref 0.0–0.2)
Basos: 1 %
EOS (ABSOLUTE): 0.2 10*3/uL (ref 0.0–0.4)
Eos: 4 %
Hematocrit: 38.9 % (ref 37.5–51.0)
Hemoglobin: 13.3 g/dL (ref 13.0–17.7)
Immature Grans (Abs): 0 10*3/uL (ref 0.0–0.1)
Immature Granulocytes: 0 %
Lymphocytes Absolute: 2.3 10*3/uL (ref 0.7–3.1)
Lymphs: 39 %
MCH: 32.4 pg (ref 26.6–33.0)
MCHC: 34.2 g/dL (ref 31.5–35.7)
MCV: 95 fL (ref 79–97)
Monocytes Absolute: 0.4 10*3/uL (ref 0.1–0.9)
Monocytes: 7 %
Neutrophils Absolute: 2.9 10*3/uL (ref 1.4–7.0)
Neutrophils: 49 %
Platelets: 177 10*3/uL (ref 150–450)
RBC: 4.1 x10E6/uL — ABNORMAL LOW (ref 4.14–5.80)
RDW: 12.4 % (ref 11.6–15.4)
WBC: 5.9 10*3/uL (ref 3.4–10.8)

## 2021-08-30 LAB — LIPID PANEL
Chol/HDL Ratio: 4 ratio (ref 0.0–5.0)
Cholesterol, Total: 162 mg/dL (ref 100–199)
HDL: 41 mg/dL (ref >39)
LDL Chol Calc (NIH): 78 mg/dL (ref 0–99)
Triglycerides: 263 mg/dL — ABNORMAL HIGH (ref 0–149)
VLDL Cholesterol Cal: 43 mg/dL — ABNORMAL HIGH (ref 5–40)

## 2021-08-30 LAB — HEMOGLOBIN A1C
Est. average glucose Bld gHb Est-mCnc: 151 mg/dL
Hgb A1c MFr Bld: 6.9 % — ABNORMAL HIGH (ref 4.8–5.6)

## 2021-08-30 LAB — TSH: TSH: 1.75 u[IU]/mL (ref 0.450–4.500)

## 2021-08-30 LAB — CARDIOVASCULAR RISK ASSESSMENT

## 2021-08-30 LAB — PSA: Prostate Specific Ag, Serum: 0.7 ng/mL (ref 0.0–4.0)

## 2021-09-14 ENCOUNTER — Ambulatory Visit: Payer: Managed Care, Other (non HMO) | Admitting: Cardiology

## 2021-09-14 ENCOUNTER — Encounter: Payer: Self-pay | Admitting: Cardiology

## 2021-09-14 VITALS — BP 168/86 | HR 70 | Ht 68.5 in | Wt 211.4 lb

## 2021-09-14 DIAGNOSIS — I10 Essential (primary) hypertension: Secondary | ICD-10-CM | POA: Diagnosis not present

## 2021-09-14 DIAGNOSIS — E7800 Pure hypercholesterolemia, unspecified: Secondary | ICD-10-CM | POA: Diagnosis not present

## 2021-09-14 DIAGNOSIS — I7121 Aneurysm of the ascending aorta, without rupture: Secondary | ICD-10-CM | POA: Diagnosis not present

## 2021-09-14 DIAGNOSIS — Z952 Presence of prosthetic heart valve: Secondary | ICD-10-CM | POA: Diagnosis not present

## 2021-09-14 MED ORDER — LOSARTAN POTASSIUM 25 MG PO TABS: 25.0000 mg | ORAL_TABLET | Freq: Every day | ORAL | 2 refills | Status: DC

## 2021-09-14 NOTE — Progress Notes (Signed)
Cardiology Office Note:    Date:  09/14/2021   ID:  Ronnie Ruiz, DOB [birth year], MRN [id number]  PCP:  Marge Duncans, PA-C  Cardiologist:  Jenne Campus, MD    Referring MD: Lillard Anes,*   No chief complaint on file. Doing well  History of Present Illness:    Ronnie Ruiz is a 56 y.o.  with complex past medical history.  He does have history of bicuspid aortic valve as well as ascending aortic aneurysm he did have a Bentall procedure done in 2020.  Also type 2 diabetes, essential hypertension, dyslipidemia, history of ATH abuse.  Gastroesophageal reflux disease.  He was doing quite well until May at that time he did have some cortisone shot done in his left shoulder and then started having difficulty controlling his diabetes on top of that he started having night sweats as well as some spikes of fever.  He end up coming to [facility] he did have positive blood cultures for Aggregatibacter, he was started on ceftriaxone, eventually ended being transferred to [facility] transesophageal echocardiogram being done which showed some possibility of abscess then cardiac CT showed perivalvular abscess.  He was brought back to the operating room and had redo sternotomy with replacing of the ascending aortic graft using 28 mm Hemashield platinum graft and redo Bentall procedure with 23 mm aortic homograft valve/root.  He tolerated procedure quite well was discharged home.  Since the time of discharge she is doing well.  He denies have any chest pain tightness squeezing pressure mid chest, he still continues to works.  He noticed when he does a lot of physical work he be tired and exhausted but no other signs and symptoms.  Past Medical History:  Diagnosis Date   Alcohol dependency ([place]) 06/24/2018   Alcoholism ([place]) 06/24/2018   Aortic stenosis    Aortic valve abscess    Ascending aortic aneurysm (HCC) 41 mm as measured by echo 07/02/2018   Atherosclerotic heart disease 06/24/2018    Bacteremia 09/26/2020   Bicuspid aortic valve 06/25/2018   Critical aortic valve stenosis mean gradient 64 mmHg, 06/25/2018   Dental caries    Diabetes mellitus type 2, controlled ([place]) 06/24/2018   Dyspnea    Endocarditis 09/26/2020   Essential hypertension 06/24/2018   Fatigue 09/15/2020   GERD (gastroesophageal reflux disease) 06/24/2018   Heart murmur 06/24/2018   Hyperlipidemia 06/24/2018   Night sweats 09/15/2020   Obesity, diabetes, and hypertension syndrome ([place]) 02/28/2020   Protein-calorie malnutrition, severe 09/28/2020   S/P aortic valve replacement and aortoplasty 10/04/2020   S/P AVR 09/22/2018   Biological Bentall Procedure using a 25 mm Edwards Magna-Ease pericardial valve and a 28 mm Gelweave Valsalva graft.   Secondary adhesive capsulitis of left shoulder 08/31/2020   Thoracic ascending aortic aneurysm [facility])     Past Surgical History:  Procedure Laterality Date   BENTALL PROCEDURE N/A 09/10/2018   Procedure: BENTALL PROCEDURE USING HEMASHIELD 28, GELWEAVE VALSALVA 28MM, AND MAGNA EASE 25MM;  Surgeon: Gaye Pollack, MD;  Location: [place];  Service: Open Heart Surgery;  Laterality: N/A;   BENTALL PROCEDURE N/A 10/04/2020   Procedure: REDO BENTALL PROCEDURE ON PUMP WITH A 28MM HEMASHIELD PLATINUM GRAFT AND 23MM HOMOGRAFT;  Surgeon: Gaye Pollack, MD;  Location: [place];  Service: Open Heart Surgery;  Laterality: N/A;  AXILLARY CANNULATION WITH 8MM GRAFT   BUBBLE STUDY  09/28/2020   Procedure: BUBBLE STUDY;  Surgeon: Elouise Munroe, MD;  Location: [place];  Service: Cardiology;;   CHOLECYSTECTOMY     ENDOVEIN HARVEST OF GREATER SAPHENOUS VEIN Right 10/04/2020   Procedure: ENDOVEIN HARVEST OF GREATER SAPHENOUS VEIN;  Surgeon: Gaye Pollack, MD;  Location: [place];  Service: Open Heart Surgery;  Laterality: Right;   Heart valve replaced revision  2022   RIGHT HEART CATH AND CORONARY ANGIOGRAPHY N/A 07/07/2018   Procedure: RIGHT HEART CATH AND CORONARY ANGIOGRAPHY;  Surgeon: Troy Sine, MD;  Location: [place] CV LAB;  Service: Cardiovascular;  Laterality: N/A;   TEE WITHOUT CARDIOVERSION N/A 09/10/2018   Procedure: TRANSESOPHAGEAL ECHOCARDIOGRAM (TEE);  Surgeon: Gaye Pollack, MD;  Location: [place];  Service: Open Heart Surgery;  Laterality: N/A;   TEE WITHOUT CARDIOVERSION N/A 09/28/2020   Procedure: TRANSESOPHAGEAL ECHOCARDIOGRAM (TEE);  Surgeon: Elouise Munroe, MD;  Location: [place];  Service: Cardiology;  Laterality: N/A;   TEE WITHOUT CARDIOVERSION N/A 10/04/2020   Procedure: TRANSESOPHAGEAL ECHOCARDIOGRAM (TEE);  Surgeon: Gaye Pollack, MD;  Location: [place];  Service: Open Heart Surgery;  Laterality: N/A;    Current Medications: Current Meds  Medication Sig   aspirin EC 325 MG EC tablet Take 1 tablet (325 mg total) by mouth daily.   atorvastatin (LIPITOR) 80 MG tablet Take 1 tablet (80 mg total) by mouth daily.   docusate sodium (COLACE) 250 MG capsule Take 250 mg by mouth daily as needed for constipation.   esomeprazole (NEXIUM) 20 MG capsule Take 20 mg by mouth every evening.   ferrous [id number] C-folic acid (TRINSICON / FOLTRIN) capsule Take 1 capsule by mouth 2 (two) times daily after a meal.   metFORMIN (GLUCOPHAGE) 500 MG tablet TAKE 1 TABLET BY MOUTH TWICE DAILY   metoprolol tartrate (LOPRESSOR) 25 MG tablet Take 0.5 tablets (12.5 mg total) by mouth 2 (two) times daily.   Omega-3 Fatty Acids (FISH OIL) 1000 MG CAPS Take 1,000 mg by mouth 2 (two) times daily.      Allergies:   Patient has no known allergies.   Social History   Socioeconomic History   Marital status: Married    Spouse name: Not on file   Number of children: Not on file   Years of education: Not on file   Highest education level: Not on file  Occupational History   Not on file  Tobacco Use   Smoking status: Never   Smokeless tobacco: Former    Types: Snuff  Vaping Use   Vaping Use: Never used  Substance and Sexual Activity   Alcohol use: Not  Currently    Alcohol/week: 2.0 standard drinks    Types: 2 Cans of beer per week    Comment: daily   Drug use: Never   Sexual activity: Not Currently  Other Topics Concern   Not on file  Social History Narrative   Not on file   Social Determinants of Health   Financial Resource Strain: Not on file  Food Insecurity: Not on file  Transportation Needs: Not on file  Physical Activity: Not on file  Stress: Not on file  Social Connections: Not on file     Family History: The patient's family history includes Alcoholism in his father; Anxiety disorder in his father; CAD in his father; Hyperlipidemia in his father; Lung cancer in his father and mother; Pulmonary disease in his father and mother; Renal cancer in his father. ROS:   Please see the history of present illness.    All 14 point review of systems negative except as described  per history of present illness  EKGs/Labs/Other Studies Reviewed:      Recent Labs: 10/05/2020: Magnesium 2.5 08/29/2021: ALT 37; BUN 8; Creatinine, Ser 0.73; Hemoglobin 13.3; Platelets 177; Potassium 4.4; Sodium 137; TSH 1.750  Recent Lipid Panel    Component Value Date/Time   CHOL 162 08/29/2021 0953   TRIG 263 (H) 08/29/2021 0953   HDL 41 08/29/2021 0953   CHOLHDL 4.0 08/29/2021 0953   LDLCALC 78 08/29/2021 0953    Physical Exam:    VS:  BP (!) 162/80 (BP Location: Right Arm, Patient Position: Sitting)   Pulse 70   Ht 5' 8.5" (1.74 m)   Wt 211 lb 6.4 oz (95.9 kg)   SpO2 98%   BMI 31.68 kg/m     Wt Readings from Last 3 Encounters:  09/14/21 211 lb 6.4 oz (95.9 kg)  08/29/21 215 lb 3.2 oz (97.6 kg)  05/23/21 205 lb (93 kg)     GEN:  Well nourished, well developed in no acute distress HEENT: Normal NECK: No JVD; No carotid bruits LYMPHATICS: No lymphadenopathy CARDIAC: RRR, soft systolic murmur grade 1/6 best heard right upper portion of sternum, no rubs, no gallops RESPIRATORY:  Clear to auscultation without rales, wheezing or  rhonchi  ABDOMEN: Soft, non-tender, non-distended MUSCULOSKELETAL:  No edema; No deformity  SKIN: Warm and dry LOWER EXTREMITIES: no swelling NEUROLOGIC:  Alert and oriented x 3 PSYCHIATRIC:  Normal affect   ASSESSMENT:    1. S/P aortic valve replacement and aortoplasty   2. Pure hypercholesterolemia   3. Aneurysm of ascending aorta without rupture ([place])   4. Essential hypertension    PLAN:    In order of problems listed above:  Status post aortic valve replacement with aortoplasty doing well recovered nicely wound healed completely asymptomatic continue present management in few months he will required another echocardiogram.  I did review note by surgeons.  There is no need to follow-up with CT, he will need to have an echocardiogram, if there is some suspicion lesion density should be considered Essential hypertension blood pressure uncontrolled.  Elevated today.  I will ask him to start taking losartan 25 daily, Chem-7 need to be done next week.  Also when he come for lab work next week we will recheck his blood pressure. Dyslipidemia I did review his K PN which show me data from Aug 29, 2021 with LDL 78 HDL 41 good cholesterol control continue present management. Diabetes his last hemoglobin A1c 6.9 this is from 08/29/2021 continue present management   Medication Adjustments/Labs and Tests Ordered: Current medicines are reviewed at length with the patient today.  Concerns regarding medicines are outlined above.  No orders of the defined types were placed in this encounter.  Medication changes: No orders of the defined types were placed in this encounter.   Signed, Park Liter, MD, [facility] 09/14/2021 9:24 AM    [place]

## 2021-09-14 NOTE — Addendum Note (Signed)
Addended by: Roddie Mc on: 09/14/2021 09:36 AM   Modules accepted: Orders

## 2021-09-14 NOTE — Patient Instructions (Signed)
Medication Instructions:  Your physician has recommended you make the following change in your medication:  Start Losartan 25 mg once daily  *If you need a refill on your cardiac medications before your next appointment, please call your pharmacy*   Lab Work: Your physician recommends that you return for lab work in: Come back next week for BMP. Can have it done when you come in for nurse visit to check BP  If you have labs (blood work) drawn today and your tests are completely normal, you will receive your results only by: [place] (if you have MyChart) OR A paper copy in the mail If you have any lab test that is abnormal or we need to change your treatment, we will call you to review the results.   Testing/Procedures: NONE   Follow-Up: At [facility], you and your health needs are our priority.  As part of our continuing mission to provide you with exceptional heart care, we have created designated Provider Care Teams.  These Care Teams include your primary Cardiologist (physician) and Advanced Practice Providers (APPs -  Physician Assistants and Nurse Practitioners) who all work together to provide you with the care you need, when you need it.  We recommend signing up for the patient portal called "MyChart".  Sign up information is provided on this After Visit Summary.  MyChart is used to connect with patients for Virtual Visits (Telemedicine).  Patients are able to view lab/test results, encounter notes, upcoming appointments, etc.  Non-urgent messages can be sent to your provider as well.   To learn more about what you can do with MyChart, go to [email or web address].    Your next appointment:   5 month(s)  The format for your next appointment:   In Person  Provider:   Jenne Campus, MD    Other Instructions   Important Information About Sugar

## 2021-09-21 ENCOUNTER — Ambulatory Visit (INDEPENDENT_AMBULATORY_CARE_PROVIDER_SITE_OTHER): Payer: Managed Care, Other (non HMO)

## 2021-09-21 DIAGNOSIS — Z5181 Encounter for therapeutic drug level monitoring: Secondary | ICD-10-CM

## 2021-09-21 DIAGNOSIS — I10 Essential (primary) hypertension: Secondary | ICD-10-CM | POA: Diagnosis not present

## 2021-09-21 NOTE — Progress Notes (Signed)
   Nurse Visit   Date of Encounter: 09/21/2021 ID: Ronnie Ruiz, DOB 24-Oct-1965, MRN [id number]  PCP:  Marianne Sofia, PA-C   [facility] Providers Cardiologist:  Gypsy Balsam, MD      Visit Details   VS:  BP 138/70 (BP Location: Left Arm, Patient Position: Sitting, Cuff Size: Normal)   Pulse 70   Ht 5' 8.5" (1.74 m)   Wt 213 lb (96.6 kg)   SpO2 94%   BMI 31.92 kg/m  , BMI Body mass index is 31.92 kg/m.  Wt Readings from Last 3 Encounters:  09/21/21 213 lb (96.6 kg)  09/14/21 211 lb 6.4 oz (95.9 kg)  08/29/21 215 lb 3.2 oz (97.6 kg)     Reason for visit: Blood pressure check & follow up blood work- BMP- after starting Losartan for increased BP-Per Dr. Bing Matter Performed today: Vitals, BMP, Education- Encouraged pt to check his blood pressures at home at report any abnormal readings.  Changes (medications, testing, etc.) : No changes Length of Visit: 15 minutes    Medications Adjustments/Labs and Tests Ordered: No orders of the defined types were placed in this encounter.  No orders of the defined types were placed in this encounter.    Signed, Neena Rhymes, RN  09/21/2021 9:09 AM

## 2021-09-22 LAB — BASIC METABOLIC PANEL
BUN/Creatinine Ratio: 11 (ref 9–20)
BUN: 8 mg/dL (ref 6–24)
CO2: 25 mmol/L (ref 20–29)
Calcium: 10.4 mg/dL — ABNORMAL HIGH (ref 8.7–10.2)
Chloride: 100 mmol/L (ref 96–106)
Creatinine, Ser: 0.74 mg/dL — ABNORMAL LOW (ref 0.76–1.27)
Glucose: 122 mg/dL — ABNORMAL HIGH (ref 70–99)
Potassium: 4.6 mmol/L (ref 3.5–5.2)
Sodium: 139 mmol/L (ref 134–144)
eGFR: 106 mL/min/{1.73_m2} (ref >59)

## 2021-09-27 ENCOUNTER — Telehealth: Payer: Self-pay

## 2021-09-27 NOTE — Telephone Encounter (Signed)
Results reviewed with pt's spouse-per DPR- as per Dr. Krasowski's note.  Pt's spouse verbalized understanding and had no additional questions. Routed to PCP  

## 2021-12-17 ENCOUNTER — Other Ambulatory Visit: Payer: Self-pay | Admitting: Cardiology

## 2021-12-17 NOTE — Telephone Encounter (Signed)
Rx refill sent to pharmacy. 

## 2022-01-07 ENCOUNTER — Ambulatory Visit: Payer: Managed Care, Other (non HMO) | Admitting: Physician Assistant

## 2022-01-10 ENCOUNTER — Encounter: Payer: Self-pay | Admitting: Physician Assistant

## 2022-01-10 ENCOUNTER — Ambulatory Visit: Payer: Self-pay | Admitting: Physician Assistant

## 2022-01-10 VITALS — BP 122/84 | HR 64 | Temp 96.9°F | Resp 14 | Ht 68.5 in | Wt 217.8 lb

## 2022-01-10 DIAGNOSIS — E1165 Type 2 diabetes mellitus with hyperglycemia: Secondary | ICD-10-CM

## 2022-01-10 DIAGNOSIS — I10 Essential (primary) hypertension: Secondary | ICD-10-CM

## 2022-01-10 DIAGNOSIS — E7800 Pure hypercholesterolemia, unspecified: Secondary | ICD-10-CM

## 2022-01-10 NOTE — Progress Notes (Signed)
Subjective:  Patient ID: Ronnie Ruiz, male    DOB: [birth year]  Age: 56 y.o. MRN: [id number]  Chief Complaint  Patient presents with   Hypertension   Hyperlipidemia    HPI  Pt presents for follow up of hypertension. The patient is tolerating the medication well without side effects. Compliance with treatment has been good; including taking medication as directed , maintains a healthy diet and regular exercise regimen , and following up as directed. Pt currently taking metoprolol 25mg  and losartan 25mg  qd  Mixed hyperlipidemia  Pt presents with hyperlipidemia.  Compliance with treatment has been good - The patient is compliant with medications, maintains a low cholesterol diet , follows up as directed , and maintains an exercise regimen . The patient denies experiencing any hypercholesterolemia related symptoms. Currently on lipitor 80mg  qd and fish oil  Pt with history of diabetes - glucose has been stable and last hgb a1c was good - he is currently on glucophage 500mg   Pt currently does not have insurance due to shut down of company - would like to do basic labwork today and defers microalbumin   Current Outpatient Medications on File Prior to Visit  Medication Sig Dispense Refill   aspirin EC 325 MG EC tablet Take 1 tablet (325 mg total) by mouth daily.     atorvastatin (LIPITOR) 80 MG tablet Take 1 tablet (80 mg total) by mouth daily. 90 tablet 2   docusate sodium (COLACE) 250 MG capsule Take 250 mg by mouth daily as needed for constipation.     esomeprazole (NEXIUM) 20 MG capsule Take 20 mg by mouth every evening.     ferrous fumarate-b12-vitamic C-folic acid (TRINSICON / FOLTRIN) capsule Take 1 capsule by mouth 2 (two) times daily after a meal. 60 capsule 2   losartan (COZAAR) 25 MG tablet Take 1 tablet (25 mg total) by mouth daily. 30 tablet 5   metFORMIN (GLUCOPHAGE) 500 MG tablet TAKE 1 TABLET BY MOUTH TWICE DAILY 180 tablet 1   metoprolol tartrate (LOPRESSOR) 25 MG tablet  Take 0.5 tablets (12.5 mg total) by mouth 2 (two) times daily. 90 tablet 2   Omega-3 Fatty Acids (FISH OIL) 1000 MG CAPS Take 1,000 mg by mouth 2 (two) times daily.      No current facility-administered medications on file prior to visit.   Past Medical History:  Diagnosis Date   Alcohol dependency (HCC) 06/24/2018   Alcoholism (HCC) 06/24/2018   Aortic stenosis    Aortic valve abscess    Ascending aortic aneurysm (HCC) 41 mm as measured by echo 07/02/2018   Atherosclerotic heart disease 06/24/2018   Bacteremia 09/26/2020   Bicuspid aortic valve 06/25/2018   Critical aortic valve stenosis mean gradient 64 mmHg, 06/25/2018   Dental caries    Diabetes mellitus type 2, controlled (HCC) 06/24/2018   Dyspnea    Endocarditis 09/26/2020   Essential hypertension 06/24/2018   Fatigue 09/15/2020   GERD (gastroesophageal reflux disease) 06/24/2018   Heart murmur 06/24/2018   Hyperlipidemia 06/24/2018   Night sweats 09/15/2020   Obesity, diabetes, and hypertension syndrome (HCC) 02/28/2020   Protein-calorie malnutrition, severe 09/28/2020   S/P aortic valve replacement and aortoplasty 10/04/2020   S/P AVR 09/22/2018   Biological Bentall Procedure using a 25 mm Edwards Magna-Ease pericardial valve and a 28 mm Gelweave Valsalva graft.   Secondary adhesive capsulitis of left shoulder 08/31/2020   Thoracic ascending aortic aneurysm [facility])    Past Surgical History:  Procedure Laterality Date   BENTALL  PROCEDURE N/A 09/10/2018   Procedure: BENTALL PROCEDURE USING HEMASHIELD 28, GELWEAVE VALSALVA 28MM, AND MAGNA EASE 25MM;  Surgeon: Gaye Pollack, MD;  Location: [place];  Service: Open Heart Surgery;  Laterality: N/A;   BENTALL PROCEDURE N/A 10/04/2020   Procedure: REDO BENTALL PROCEDURE ON PUMP WITH A 28MM HEMASHIELD PLATINUM GRAFT AND 23MM HOMOGRAFT;  Surgeon: Gaye Pollack, MD;  Location: [place];  Service: Open Heart Surgery;  Laterality: N/A;  AXILLARY CANNULATION WITH 8MM GRAFT   BUBBLE STUDY  09/28/2020   Procedure:  BUBBLE STUDY;  Surgeon: Elouise Munroe, MD;  Location: [place];  Service: Cardiology;;   CHOLECYSTECTOMY     ENDOVEIN HARVEST OF GREATER SAPHENOUS VEIN Right 10/04/2020   Procedure: ENDOVEIN HARVEST OF GREATER SAPHENOUS VEIN;  Surgeon: Gaye Pollack, MD;  Location: [place];  Service: Open Heart Surgery;  Laterality: Right;   Heart valve replaced revision  2022   RIGHT HEART CATH AND CORONARY ANGIOGRAPHY N/A 07/07/2018   Procedure: RIGHT HEART CATH AND CORONARY ANGIOGRAPHY;  Surgeon: Troy Sine, MD;  Location: [place] CV LAB;  Service: Cardiovascular;  Laterality: N/A;   TEE WITHOUT CARDIOVERSION N/A 09/10/2018   Procedure: TRANSESOPHAGEAL ECHOCARDIOGRAM (TEE);  Surgeon: Gaye Pollack, MD;  Location: [place];  Service: Open Heart Surgery;  Laterality: N/A;   TEE WITHOUT CARDIOVERSION N/A 09/28/2020   Procedure: TRANSESOPHAGEAL ECHOCARDIOGRAM (TEE);  Surgeon: Elouise Munroe, MD;  Location: [place];  Service: Cardiology;  Laterality: N/A;   TEE WITHOUT CARDIOVERSION N/A 10/04/2020   Procedure: TRANSESOPHAGEAL ECHOCARDIOGRAM (TEE);  Surgeon: Gaye Pollack, MD;  Location: [place];  Service: Open Heart Surgery;  Laterality: N/A;    Family History  Problem Relation Age of Onset   Lung cancer Mother    Pulmonary disease Mother    Alcoholism Father    Anxiety disorder Father    Hyperlipidemia Father    Lung cancer Father    CAD Father    Renal cancer Father    Pulmonary disease Father    Social History   Socioeconomic History   Marital status: Married    Spouse name: Not on file   Number of children: Not on file   Years of education: Not on file   Highest education level: Not on file  Occupational History   Not on file  Tobacco Use   Smoking status: Never   Smokeless tobacco: Former    Types: Snuff  Vaping Use   Vaping Use: Never used  Substance and Sexual Activity   Alcohol use: Not Currently    Alcohol/week: 2.0 standard drinks of alcohol    Types: 2  Cans of beer per week    Comment: daily   Drug use: Never   Sexual activity: Not Currently  Other Topics Concern   Not on file  Social History Narrative   Not on file   Social Determinants of Health   Financial Resource Strain: Not on file  Food Insecurity: Not on file  Transportation Needs: Not on file  Physical Activity: Not on file  Stress: Not on file  Social Connections: Not on file    Review of Systems  CONSTITUTIONAL: Negative for chills, fatigue, fever, unintentional weight gain and unintentional weight loss.  E/N/T: Negative for ear pain, nasal congestion and sore throat.  CARDIOVASCULAR: Negative for chest pain, dizziness, palpitations and pedal edema.  RESPIRATORY: Negative for recent cough and dyspnea.  GASTROINTESTINAL: Negative for abdominal pain, acid reflux symptoms, constipation, diarrhea, nausea and vomiting.  MSK: Negative for arthralgias and myalgias.  INTEGUMENTARY: Negative for rash.       Objective:  PHYSICAL EXAM:   VS: BP 122/84 (BP Location: Left Arm, Patient Position: Sitting, Cuff Size: Normal)   Pulse 64   Temp (!) 96.9 F (36.1 C) (Temporal)   Resp 14   Ht 5' 8.5" (1.74 m)   Wt 217 lb 12.8 oz (98.8 kg)   SpO2 99%   BMI 32.63 kg/m   GEN: Well nourished, well developed, in no acute distress  Cardiac: RRR; no murmurs, rubs, or gallops,no edema - Respiratory:  normal respiratory rate and pattern with no distress - normal breath sounds with no rales, rhonchi, wheezes or rubs Skin: warm and dry, no rash    Lab Results  Component Value Date   WBC 5.9 08/29/2021   HGB 13.3 08/29/2021   HCT 38.9 08/29/2021   PLT 177 08/29/2021   GLUCOSE 122 (H) 09/21/2021   CHOL 162 08/29/2021   TRIG 263 (H) 08/29/2021   HDL 41 08/29/2021   LDLCALC 78 08/29/2021   ALT 37 08/29/2021   AST 41 (H) 08/29/2021   NA 139 09/21/2021   K 4.6 09/21/2021   CL 100 09/21/2021   CREATININE 0.74 (L) 09/21/2021   BUN 8 09/21/2021   CO2 25 09/21/2021   TSH  1.750 08/29/2021   INR 1.4 (H) 10/04/2020   HGBA1C 6.9 (H) 08/29/2021      Assessment & Plan:   Problem List Items Addressed This Visit       Cardiovascular and Mediastinum   Essential hypertension - Primary   Relevant Orders   Comprehensive metabolic panel   Hemoglobin A1c Continue current meds     Other   Hyperlipidemia   Relevant Orders   Lipid panel Continue meds   Other Visit Diagnoses     Type 2 diabetes mellitus with hyperglycemia, without long-term current use of insulin (HCC)       Relevant Orders   Comprehensive metabolic panel   Hemoglobin A1c   Lipid panel Continue meds     .  No orders of the defined types were placed in this encounter.   Orders Placed This Encounter  Procedures   Comprehensive metabolic panel   Hemoglobin A1c   Lipid panel     Follow-up: Return in about 6 months (around 07/11/2022) for chronic fasting follow up.  An After Visit Summary was printed and given to the patient.  Jettie Pagan [facility] [phone]

## 2022-01-11 ENCOUNTER — Other Ambulatory Visit: Payer: Self-pay | Admitting: Physician Assistant

## 2022-01-11 ENCOUNTER — Other Ambulatory Visit: Payer: Self-pay

## 2022-01-11 DIAGNOSIS — E1165 Type 2 diabetes mellitus with hyperglycemia: Secondary | ICD-10-CM

## 2022-01-11 LAB — COMPREHENSIVE METABOLIC PANEL
ALT: 52 IU/L — ABNORMAL HIGH (ref 0–44)
AST: 56 IU/L — ABNORMAL HIGH (ref 0–40)
Albumin/Globulin Ratio: 2 (ref 1.2–2.2)
Albumin: 4.8 g/dL (ref 3.8–4.9)
Alkaline Phosphatase: 68 IU/L (ref 44–121)
BUN/Creatinine Ratio: 9 (ref 9–20)
BUN: 6 mg/dL (ref 6–24)
Bilirubin Total: 0.6 mg/dL (ref 0.0–1.2)
CO2: 23 mmol/L (ref 20–29)
Calcium: 10.1 mg/dL (ref 8.7–10.2)
Chloride: 100 mmol/L (ref 96–106)
Creatinine, Ser: 0.7 mg/dL — ABNORMAL LOW (ref 0.76–1.27)
Globulin, Total: 2.4 g/dL (ref 1.5–4.5)
Glucose: 121 mg/dL — ABNORMAL HIGH (ref 70–99)
Potassium: 4.5 mmol/L (ref 3.5–5.2)
Sodium: 140 mmol/L (ref 134–144)
Total Protein: 7.2 g/dL (ref 6.0–8.5)
eGFR: 107 mL/min/{1.73_m2} (ref >59)

## 2022-01-11 LAB — LIPID PANEL
Chol/HDL Ratio: 3.9 ratio (ref 0.0–5.0)
Cholesterol, Total: 162 mg/dL (ref 100–199)
HDL: 42 mg/dL (ref >39)
LDL Chol Calc (NIH): 69 mg/dL (ref 0–99)
Triglycerides: 319 mg/dL — ABNORMAL HIGH (ref 0–149)
VLDL Cholesterol Cal: 51 mg/dL — ABNORMAL HIGH (ref 5–40)

## 2022-01-11 LAB — HEMOGLOBIN A1C
Est. average glucose Bld gHb Est-mCnc: 160 mg/dL
Hgb A1c MFr Bld: 7.2 % — ABNORMAL HIGH (ref 4.8–5.6)

## 2022-01-11 LAB — CARDIOVASCULAR RISK ASSESSMENT

## 2022-01-11 MED ORDER — METFORMIN HCL 1000 MG PO TABS: 1000.0000 mg | ORAL_TABLET | Freq: Two times a day (BID) | ORAL | 2 refills | Status: DC

## 2022-04-14 ENCOUNTER — Other Ambulatory Visit: Payer: Self-pay | Admitting: Physician Assistant

## 2022-04-14 DIAGNOSIS — E1165 Type 2 diabetes mellitus with hyperglycemia: Secondary | ICD-10-CM

## 2022-05-06 ENCOUNTER — Ambulatory Visit: Payer: Self-pay | Admitting: Cardiology

## 2022-05-09 ENCOUNTER — Encounter: Payer: Self-pay | Admitting: Cardiology

## 2022-05-09 ENCOUNTER — Ambulatory Visit: Payer: BC Managed Care – PPO | Attending: Cardiology | Admitting: Cardiology

## 2022-05-09 VITALS — BP 146/78 | HR 62 | Ht 69.0 in | Wt 212.8 lb

## 2022-05-09 DIAGNOSIS — E782 Mixed hyperlipidemia: Secondary | ICD-10-CM | POA: Diagnosis not present

## 2022-05-09 DIAGNOSIS — Z952 Presence of prosthetic heart valve: Secondary | ICD-10-CM

## 2022-05-09 DIAGNOSIS — I10 Essential (primary) hypertension: Secondary | ICD-10-CM

## 2022-05-09 NOTE — Addendum Note (Signed)
Addended by: Jerl Santos R on: 05/09/2022 04:03 PM   Modules accepted: Orders

## 2022-05-09 NOTE — Progress Notes (Signed)
Cardiology Office Note:    Date:  05/09/2022   ID:  Ronnie Ruiz, DOB 05/12/1965, MRN [id number]  PCP:  Marge Duncans, PA-C  Cardiologist:  Jenne Campus, MD    Referring MD: Marge Duncans, PA-C   Chief Complaint  Patient presents with   Follow-up  Doing well  History of Present Illness:    Ronnie Ruiz is a 57 y.o. male  with complex past medical history.  He does have history of bicuspid aortic valve as well as ascending aortic aneurysm he did have a Bentall procedure done in 2020.  Also type 2 diabetes, essential hypertension, dyslipidemia, history of ATH abuse.  Gastroesophageal reflux disease.  He was doing quite well until May at that time he did have some cortisone shot done in his left shoulder and then started having difficulty controlling his diabetes on top of that he started having night sweats as well as some spikes of fever.  He end up coming to [facility] he did have positive blood cultures for Aggregatibacter, he was started on ceftriaxone, eventually ended being transferred to [facility] transesophageal echocardiogram being done which showed some possibility of abscess then cardiac CT showed perivalvular abscess.  He was brought back to the operating room and had redo sternotomy with replacing of the ascending aortic graft using 28 mm Hemashield platinum graft and redo Bentall procedure with 23 mm aortic homograft valve/root.  He tolerated procedure quite well was discharged home.   Comes today to months for follow-up.  Overall doing very well.  He denies of any chest pain tightness squeezing pressure burning chest.  Still works with no difficulties he jokingly telling me that he is not as strong when he was 52 but still doing quite well.  Past Medical History:  Diagnosis Date   Alcohol dependency ([place]) 06/24/2018   Alcoholism ([place]) 06/24/2018   Aortic stenosis    Aortic valve abscess    Ascending aortic aneurysm (HCC) 41 mm as measured by echo 07/02/2018    Atherosclerotic heart disease 06/24/2018   Bacteremia 09/26/2020   Bicuspid aortic valve 06/25/2018   Critical aortic valve stenosis mean gradient 64 mmHg, 06/25/2018   Dental caries    Diabetes mellitus type 2, controlled ([place]) 06/24/2018   Dyspnea    Endocarditis 09/26/2020   Essential hypertension 06/24/2018   Fatigue 09/15/2020   GERD (gastroesophageal reflux disease) 06/24/2018   Heart murmur 06/24/2018   Hyperlipidemia 06/24/2018   Night sweats 09/15/2020   Obesity, diabetes, and hypertension syndrome ([place]) 02/28/2020   Protein-calorie malnutrition, severe 09/28/2020   S/P aortic valve replacement and aortoplasty 10/04/2020   S/P AVR 09/22/2018   Biological Bentall Procedure using a 25 mm Edwards Magna-Ease pericardial valve and a 28 mm Gelweave Valsalva graft.   Secondary adhesive capsulitis of left shoulder 08/31/2020   Thoracic ascending aortic aneurysm [facility])     Past Surgical History:  Procedure Laterality Date   BENTALL PROCEDURE N/A 09/10/2018   Procedure: BENTALL PROCEDURE USING HEMASHIELD 28, GELWEAVE VALSALVA 28MM, AND MAGNA EASE 25MM;  Surgeon: Gaye Pollack, MD;  Location: [place];  Service: Open Heart Surgery;  Laterality: N/A;   BENTALL PROCEDURE N/A 10/04/2020   Procedure: REDO BENTALL PROCEDURE ON PUMP WITH A 28MM HEMASHIELD PLATINUM GRAFT AND 23MM HOMOGRAFT;  Surgeon: Gaye Pollack, MD;  Location: [place];  Service: Open Heart Surgery;  Laterality: N/A;  AXILLARY CANNULATION WITH 8MM GRAFT   BUBBLE STUDY  09/28/2020   Procedure: BUBBLE STUDY;  Surgeon: Cherlynn Kaiser  A, MD;  Location: [facility] ENDOS[facility];  Service: Cardiology;;   CHOLECYSTECTOMY     ENDOVEIN HARVEST OF GREATER SAPHENOUS VEIN Right 10/04/2020   Procedure: ENDOVEIN HARVEST OF GREATER SAPHENOUS VEIN;  Surgeon: [name], MD;  Location: [facility] OR;  Service: Open Heart Surgery;  Laterality: Right;   Heart valve replaced revision  2022   RIGHT HEART CATH AND CORONARY ANGIOGRAPHY N/A 07/07/2018   Procedure: RIGHT HEART CATH AND  CORONARY ANGIOGRAPHY;  Surgeon: Lennette Bihari, MD;  Location: [facility] IN[facility]AS[facility] CV [facility];  Service: Cardiovascular;  Laterality: N/A;   TEE WITHOUT CARDIOVERSION N/A 09/10/2018   Procedure: TRANSESOPHAGEAL ECHOCARDIOGRAM (TEE);  Surgeon: [name], MD;  [facility] OR;  Service: Open Heart Surgery;  Laterality: N/A;   TEE WITHOUT CARDIOVERSION N/A 09/28/2020   Procedure: TRANSESOPHAGEAL ECHOCARDIOGRAM (TEE);  Surgeon: Parke Poisson, MD;  Location: [facility] ENDOSCOPY;  Service: Cardiology;  Laterality: N/A;   TEE WITHOUT CARDIOVERSION N/A 10/04/2020   Procedure: TRANSESOPHAGEAL ECHOCARDIOGRAM (TEE);  Surgeon: [name], MD;  Location: [facility] OR;  Service: Open Heart Surgery;  Laterality: N/A;    Current Medications: Current Meds  Medication Sig   aspirin EC 325 MG EC tablet Take 1 tablet (325 mg total) by mouth daily.   atorvastatin (LIPITOR) 80 MG tablet Take 1 tablet (80 mg total) by mouth daily.   docusate sodium (COLACE) 250 MG capsule Take 250 mg by mouth daily as needed for constipation.   esomeprazole (NEXIUM) 20 MG capsule Take 20 mg by mouth every evening.   ferrous fumarate-b12-vitamic C-folic acid (TRINSICON / FOLTRIN) capsule Take 1 capsule by mouth 2 (two) times daily after a meal.   losartan (COZAAR) 25 MG tablet Take 1 tablet (25 mg total) by mouth daily.   metFORMIN (GLUCOPHAGE) 1000 MG tablet TAKE 1 TABLET(1000 MG) BY MOUTH TWICE DAILY WITH A MEAL (Patient taking differently: Take 1,000 mg by mouth 2 (two) times daily with a meal.)   metoprolol tartrate (LOPRESSOR) 25 MG tablet Take 0.5 tablets (12.5 mg total) by mouth 2 (two) times daily.   Omega-3 Fatty Acids (FISH OIL) 1000 MG CAPS Take 1,000 mg by mouth 2 (two) times daily.      Allergies:   Patient has no known allergies.   Social History   Socioeconomic History   Marital status: Married    Spouse name: Not on file   Number of children: Not on file   Years of education: Not on file   Highest education level: Not  on file  Occupational History   Not on file  Tobacco Use   Smoking status: Never   Smokeless tobacco: Former    Types: Snuff  Vaping Use   Vaping Use: Never used  Substance and Sexual Activity   Alcohol use: Not Currently    Alcohol/week: 2.0 standard drinks of alcohol    Types: 2 Cans of beer per week    Comment: daily   Drug use: Never   Sexual activity: Not Currently  Other Topics Concern   Not on file  Social History Narrative   Not on file   Social Determinants of Health   Financial Resource Strain: Not on file  Food Insecurity: Not on file  Transportation Needs: Not on file  Physical Activity: Not on file  Stress: Not on file  Social Connections: Not on file     Family History: The patient's family history includes Alcoholism in his father; Anxiety disorder in his father; CAD in his father; Hyperlipidemia in  his father; Lung cancer in his father and mother; Pulmonary disease in his father and mother; Renal cancer in his father. ROS:   Please see the history of present illness.    All 14 point review of systems negative except as described per history of present illness  EKGs/Labs/Other Studies Reviewed:      Recent Labs: 08/29/2021: Hemoglobin 13.3; Platelets 177; TSH 1.750 01/10/2022: ALT 52; BUN 6; Creatinine, Ser 0.70; Potassium 4.5; Sodium 140  Recent Lipid Panel    Component Value Date/Time   CHOL 162 01/10/2022 0924   TRIG 319 (H) 01/10/2022 0924   HDL 42 01/10/2022 0924   CHOLHDL 3.9 01/10/2022 0924   LDLCALC 69 01/10/2022 0924    Physical Exam:    VS:  BP (!) 146/78 (BP Location: Left Arm, Patient Position: Sitting)   Pulse 62   Ht 5\' 9"  (1.753 m)   Wt 212 lb 12.8 oz (96.5 kg)   SpO2 95%   BMI 31.43 kg/m     Wt Readings from Last 3 Encounters:  05/09/22 212 lb 12.8 oz (96.5 kg)  01/10/22 217 lb 12.8 oz (98.8 kg)  09/21/21 213 lb (96.6 kg)     GEN:  Well nourished, well developed in no acute distress HEENT: Normal NECK: No JVD; No  carotid bruits LYMPHATICS: No lymphadenopathy CARDIAC: RRR, systolic ejection murmur grade 2/6 best heard right upper portion of the sternum, no rubs, no gallops RESPIRATORY:  Clear to auscultation without rales, wheezing or rhonchi  ABDOMEN: Soft, non-tender, non-distended MUSCULOSKELETAL:  No edema; No deformity  SKIN: Warm and dry LOWER EXTREMITIES: no swelling NEUROLOGIC:  Alert and oriented x 3 PSYCHIATRIC:  Normal affect   ASSESSMENT:    1. S/P aortic valve replacement and aortoplasty   2. Essential hypertension   3. Mixed hyperlipidemia    PLAN:    In order of problems listed above:  Status post aortic valve replacement with arthroplasty details as described above.  Stable doing well echocardiogram will be done.  Denies have any signs and symptoms of infection. Essential hypertension blood pressure elevated but he said he did not take his medications today I discussed at length importance of taking medication on the regular basis.  I also asked him to check blood pressure on the regular basis for next week or so and bring results to me.  I told him to do it when he will come for a echocardiogram. Mixed dyslipidemia I did review his K PN which show me his LDL 69 HDL 42 this is from January 10, 2022 good cholesterol control we will continue present management. Diabetes his last hemoglobin A1c done in January 10, 2022 was 7.2 still somewhat elevated.  He is following by primary care physician working hard trying to improve his cholesterol as well as diabetes control.   Medication Adjustments/Labs and Tests Ordered: Current medicines are reviewed at length with the patient today.  Concerns regarding medicines are outlined above.  No orders of the defined types were placed in this encounter.  Medication changes: No orders of the defined types were placed in this encounter.   Signed, Park Liter, MD, [facility] 05/09/2022 3:54 PM    [place]

## 2022-05-09 NOTE — Patient Instructions (Signed)
Medication Instructions:  Your physician recommends that you continue on your current medications as directed. Please refer to the Current Medication list given to you today.  *If you need a refill on your cardiac medications before your next appointment, please call your pharmacy*   Lab Work: NONE If you have labs (blood work) drawn today and your tests are completely normal, you will receive your results only by: MyChart Message (if you have MyChart) OR A paper copy in the mail If you have any lab test that is abnormal or we need to change your treatment, we will call you to review the results.   Testing/Procedures: Your physician has requested that you have an echocardiogram. Echocardiography is a painless test that uses sound waves to create images of your heart. It provides your doctor with information about the size and shape of your heart and how well your heart's chambers and valves are working. This procedure takes approximately one hour. There are no restrictions for this procedure. Please do NOT wear cologne, perfume, aftershave, or lotions (deodorant is allowed). Please arrive 15 minutes prior to your appointment time.    Follow-Up: At [facility], you and your health needs are our priority.  As part of our continuing mission to provide you with exceptional heart care, we have created designated Provider Care Teams.  These Care Teams include your primary Cardiologist (physician) and Advanced Practice Providers (APPs -  Physician Assistants and Nurse Practitioners) who all work together to provide you with the care you need, when you need it.  We recommend signing up for the patient portal called "MyChart".  Sign up information is provided on this After Visit Summary.  MyChart is used to connect with patients for Virtual Visits (Telemedicine).  Patients are able to view lab/test results, encounter notes, upcoming appointments, etc.  Non-urgent messages can be sent to your  provider as well.   To learn more about what you can do with MyChart, go to [email or web address].com.    Your next appointment:   6 month(s)  Provider:   Robert Krasowski, MD    Other Instructions   

## 2022-05-10 NOTE — Addendum Note (Signed)
Addended by: Darrel Reach on: 05/10/2022 08:25 AM   Modules accepted: Orders

## 2022-05-17 ENCOUNTER — Ambulatory Visit: Payer: BC Managed Care – PPO | Attending: Cardiology

## 2022-05-17 ENCOUNTER — Telehealth: Payer: Self-pay

## 2022-05-17 DIAGNOSIS — Z952 Presence of prosthetic heart valve: Secondary | ICD-10-CM

## 2022-05-17 DIAGNOSIS — I10 Essential (primary) hypertension: Secondary | ICD-10-CM

## 2022-05-17 DIAGNOSIS — E782 Mixed hyperlipidemia: Secondary | ICD-10-CM

## 2022-05-17 NOTE — Telephone Encounter (Signed)
Pt came by to leave BP readings: As instructed on 05-09-22 appt  05-10-22 3:30PM- 108/36 HR 72  05-11-22 1:00PM- 158/71 HR 80  05-12-22 5:30PM- 139/65 HR 65  05-15-22 5:00PM- 164/73 HR 69  05-16-22 6:00PM- 136/71 HR 67

## 2022-05-18 LAB — ECHOCARDIOGRAM COMPLETE
AR max vel: 2.82 cm2
AV Area VTI: 2.91 cm2
AV Area mean vel: 2.92 cm2
AV Mean grad: 3 mmHg
AV Peak grad: 6.8 mmHg
Ao pk vel: 1.3 m/s
Area-P 1/2: 4.15 cm2
MV M vel: 3.95 m/s
MV Peak grad: 62.4 mmHg
S' Lateral: 3.9 cm

## 2022-05-22 MED ORDER — LOSARTAN POTASSIUM 25 MG PO TABS: 50.0000 mg | ORAL_TABLET | Freq: Every day | ORAL | 5 refills | Status: DC

## 2022-05-22 NOTE — Telephone Encounter (Signed)
Spoke with Burr Medico- spouse- per DPR- regarding BP readings - Advised per Dr. Wendy Poet note to increase Losartan to 50mg  daily and BMP in 1 week. Spouse agreed and verbalized understanding.

## 2022-05-22 NOTE — Addendum Note (Signed)
Addended by: Jacobo Forest D on: 05/22/2022 01:11 PM   Modules accepted: Orders

## 2022-05-29 ENCOUNTER — Telehealth: Payer: Self-pay

## 2022-05-29 NOTE — Telephone Encounter (Signed)
-----   Message from Park Liter, MD sent at 05/23/2022 12:19 PM EST ----- Echocardiogram showed normal left ventricle ejection fraction, mild mitral valve regurgitation, overall looks good valve is functioning properly.

## 2022-05-29 NOTE — Telephone Encounter (Signed)
S/w Marlowe Kays, notified of results.

## 2022-05-31 DIAGNOSIS — I10 Essential (primary) hypertension: Secondary | ICD-10-CM | POA: Diagnosis not present

## 2022-06-01 LAB — BASIC METABOLIC PANEL
BUN/Creatinine Ratio: 12 (ref 9–20)
BUN: 8 mg/dL (ref 6–24)
CO2: 24 mmol/L (ref 20–29)
Calcium: 9.8 mg/dL (ref 8.7–10.2)
Chloride: 101 mmol/L (ref 96–106)
Creatinine, Ser: 0.69 mg/dL — ABNORMAL LOW (ref 0.76–1.27)
Glucose: 113 mg/dL — ABNORMAL HIGH (ref 70–99)
Potassium: 4.4 mmol/L (ref 3.5–5.2)
Sodium: 140 mmol/L (ref 134–144)
eGFR: 107 mL/min/{1.73_m2} (ref >59)

## 2022-07-16 ENCOUNTER — Other Ambulatory Visit: Payer: Self-pay | Admitting: Cardiology

## 2022-07-16 ENCOUNTER — Encounter: Payer: Self-pay | Admitting: Physician Assistant

## 2022-07-16 ENCOUNTER — Ambulatory Visit: Payer: BC Managed Care – PPO | Admitting: Physician Assistant

## 2022-07-16 VITALS — BP 146/76 | HR 67 | Temp 98.0°F | Ht 69.0 in | Wt 209.0 lb

## 2022-07-16 DIAGNOSIS — I2510 Atherosclerotic heart disease of native coronary artery without angina pectoris: Secondary | ICD-10-CM | POA: Diagnosis not present

## 2022-07-16 DIAGNOSIS — I10 Essential (primary) hypertension: Secondary | ICD-10-CM | POA: Diagnosis not present

## 2022-07-16 DIAGNOSIS — M79602 Pain in left arm: Secondary | ICD-10-CM

## 2022-07-16 DIAGNOSIS — E1165 Type 2 diabetes mellitus with hyperglycemia: Secondary | ICD-10-CM | POA: Diagnosis not present

## 2022-07-16 DIAGNOSIS — Z952 Presence of prosthetic heart valve: Secondary | ICD-10-CM

## 2022-07-16 DIAGNOSIS — K219 Gastro-esophageal reflux disease without esophagitis: Secondary | ICD-10-CM

## 2022-07-16 DIAGNOSIS — Z1211 Encounter for screening for malignant neoplasm of colon: Secondary | ICD-10-CM

## 2022-07-16 DIAGNOSIS — E7800 Pure hypercholesterolemia, unspecified: Secondary | ICD-10-CM | POA: Diagnosis not present

## 2022-07-16 DIAGNOSIS — M79601 Pain in right arm: Secondary | ICD-10-CM

## 2022-07-16 DIAGNOSIS — F10229 Alcohol dependence with intoxication, unspecified: Secondary | ICD-10-CM

## 2022-07-16 MED ORDER — PREDNISONE 20 MG PO TABS: ORAL_TABLET | ORAL | 0 refills | Status: AC

## 2022-07-16 MED ORDER — LOSARTAN POTASSIUM 50 MG PO TABS: 50.0000 mg | ORAL_TABLET | Freq: Every day | ORAL | 1 refills | Status: DC

## 2022-07-16 NOTE — Progress Notes (Signed)
Subjective:  Patient ID: Ronnie Ruiz, male    DOB: Sep 07, 1965  Age: 57 y.o. MRN: [id number]  Chief Complaint  Patient presents with   Hypertension    HPI  Pt presents for follow up of hypertension. The patient is tolerating the medication well without side effects. Compliance with treatment has been good; including taking medication as directed , maintains a healthy diet and regular exercise regimen , and following up as directed. Pt currently taking metoprolol 25mg  bid and losartan 25mg  qd - bp still staying slightly elevated - will adjust medication  Mixed hyperlipidemia  Pt presents with hyperlipidemia.  Compliance with treatment has been good - The patient is compliant with medications, maintains a low cholesterol diet , follows up as directed , and maintains an exercise regimen . The patient denies experiencing any hypercholesterolemia related symptoms. 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  States at times both arms and shoulders hurt and feels tingling down both arms intermittently.  He does not take NSAIDs regularly.  States in past he had injections in shoulders which helped however his symptoms today are more consistent with cervical radiculopathy.  Pt would like to get another cologuard sent - did not complete last one  Pt does have history of alcohol dependence - is drinking 6-8 beers daily  Current Outpatient Medications on File Prior to Visit  Medication Sig Dispense  Refill   aspirin EC 325 MG EC tablet Take 1 tablet (325 mg total) by mouth daily.     atorvastatin (LIPITOR) 80 MG tablet Take 1 tablet (80 mg total) by mouth daily. 90 tablet 2   docusate sodium (COLACE) 250 MG capsule Take 250 mg by mouth daily as needed for constipation.     esomeprazole (NEXIUM) 20 MG capsule Take 20 mg by mouth every evening.     ferrous [id number] C-folic acid (TRINSICON / FOLTRIN) capsule Take 1 capsule by mouth 2 (two) times daily after a meal. 60 capsule 2   metFORMIN (GLUCOPHAGE) 1000 MG tablet TAKE 1 TABLET(1000 MG) BY MOUTH TWICE DAILY WITH A MEAL (Patient taking differently: Take 1,000 mg by mouth 2 (two) times daily with a meal.) 60 tablet 2   metoprolol tartrate (LOPRESSOR) 25 MG tablet Take 0.5 tablets (12.5 mg total) by mouth 2 (two) times daily. 90 tablet 2   Omega-3 Fatty Acids (FISH OIL) 1000 MG CAPS Take 1,000 mg by mouth 2 (two) times daily.      No current facility-administered medications on file prior to visit.   Past Medical History:  Diagnosis Date   Alcohol dependency ([place]) 06/24/2018   Alcoholism ([place]) 06/24/2018   Aortic stenosis    Aortic valve abscess    Ascending aortic aneurysm (HCC) 41 mm as measured by echo 07/02/2018   Atherosclerotic heart disease 06/24/2018   Bacteremia 09/26/2020   Bicuspid aortic valve 06/25/2018   Critical aortic valve stenosis mean gradient  64 mmHg, 06/25/2018   Dental caries    Diabetes mellitus type 2, controlled ([place]) 06/24/2018   Dyspnea    Endocarditis 09/26/2020   Essential hypertension 06/24/2018   Fatigue 09/15/2020   GERD (gastroesophageal reflux disease) 06/24/2018   Heart murmur 06/24/2018   Hyperlipidemia 06/24/2018   Night sweats 09/15/2020   Obesity, diabetes, and hypertension syndrome ([place]) 02/28/2020   Protein-calorie malnutrition, severe 09/28/2020   S/P aortic valve replacement and aortoplasty 10/04/2020   S/P AVR 09/22/2018   Biological Bentall Procedure using a 25 mm Edwards Magna-Ease pericardial valve and  a 28 mm Gelweave Valsalva graft.   Secondary adhesive capsulitis of left shoulder 08/31/2020   Thoracic ascending aortic aneurysm [facility])    Past Surgical History:  Procedure Laterality Date   BENTALL PROCEDURE N/A 09/10/2018   Procedure: BENTALL PROCEDURE USING HEMASHIELD 28, GELWEAVE VALSALVA 28MM, AND MAGNA EASE 25MM;  Surgeon: Gaye Pollack, MD;  Location: [place];  Service: Open Heart Surgery;  Laterality: N/A;   BENTALL PROCEDURE N/A 10/04/2020   Procedure: REDO BENTALL PROCEDURE ON PUMP WITH A 28MM HEMASHIELD PLATINUM GRAFT AND 23MM HOMOGRAFT;  Surgeon: Gaye Pollack, MD;  Location: [place];  Service: Open Heart Surgery;  Laterality: N/A;  AXILLARY CANNULATION WITH 8MM GRAFT   BUBBLE STUDY  09/28/2020   Procedure: BUBBLE STUDY;  Surgeon: Elouise Munroe, MD;  Location: [place];  Service: Cardiology;;   CHOLECYSTECTOMY     ENDOVEIN HARVEST OF GREATER SAPHENOUS VEIN Right 10/04/2020   Procedure: ENDOVEIN HARVEST OF GREATER SAPHENOUS VEIN;  Surgeon: Gaye Pollack, MD;  Location: [place];  Service: Open Heart Surgery;  Laterality: Right;   Heart valve replaced revision  2022   RIGHT HEART CATH AND CORONARY ANGIOGRAPHY N/A 07/07/2018   Procedure: RIGHT HEART CATH AND CORONARY ANGIOGRAPHY;  Surgeon: Troy Sine, MD;  Location: [place] CV LAB;  Service: Cardiovascular;  Laterality: N/A;   TEE WITHOUT CARDIOVERSION N/A 09/10/2018   Procedure: TRANSESOPHAGEAL ECHOCARDIOGRAM (TEE);  Surgeon: Gaye Pollack, MD;  Location: [place];  Service: Open Heart Surgery;  Laterality: N/A;   TEE WITHOUT CARDIOVERSION N/A 09/28/2020   Procedure: TRANSESOPHAGEAL ECHOCARDIOGRAM (TEE);  Surgeon: Elouise Munroe, MD;  Location: [place];  Service: Cardiology;  Laterality: N/A;   TEE WITHOUT CARDIOVERSION N/A 10/04/2020   Procedure: TRANSESOPHAGEAL ECHOCARDIOGRAM (TEE);  Surgeon: Gaye Pollack, MD;  Location: [place];  Service: Open Heart Surgery;  Laterality: N/A;    Family History  Problem  Relation Age of Onset   Lung cancer Mother    Pulmonary disease Mother    Alcoholism Father    Anxiety disorder Father    Hyperlipidemia Father    Lung cancer Father    CAD Father    Renal cancer Father    Pulmonary disease Father    Social History   Socioeconomic History   Marital status: Married    Spouse name: Not on file   Number of children: Not on file   Years of education: Not on file   Highest education level: Not on file  Occupational History   Not on file  Tobacco Use   Smoking status: Never   Smokeless tobacco: Former    Types: Snuff  Vaping Use   Vaping Use: Never used  Substance and Sexual Activity   Alcohol use: Not Currently    Alcohol/week: 2.0 standard drinks of alcohol    Types: 2 Cans of beer per week    Comment: daily   Drug  use: Never   Sexual activity: Not Currently  Other Topics Concern   Not on file  Social History Narrative   Not on file   Social Determinants of Health   Financial Resource Strain: Not on file  Food Insecurity: Not on file  Transportation Needs: Not on file  Physical Activity: Not on file  Stress: Not on file  Social Connections: Not on file   CONSTITUTIONAL: Negative for chills, fatigue, fever, unintentional weight gain and unintentional weight loss.  E/N/T: Negative for ear pain, nasal congestion and sore throat.  CARDIOVASCULAR: Negative for chest pain, dizziness, palpitations and pedal edema.  RESPIRATORY: Negative for recent cough and dyspnea.  GASTROINTESTINAL: Negative for abdominal pain, acid reflux symptoms, constipation, diarrhea, nausea and vomiting.  MSK: see HPI INTEGUMENTARY: Negative for rash.  NEUROLOGICAL: Negative for dizziness and headaches.  PSYCHIATRIC: Negative for sleep disturbance and to question depression screen.  Negative for depression, negative for anhedonia.       Objective:  PHYSICAL EXAM:   VS: BP (!) 146/76   Pulse 67   Temp 98 F (36.7 C) (Temporal)   Ht 5\' 9"  (1.753 m)   Wt  209 lb (94.8 kg)   SpO2 92%   BMI 30.86 kg/m   GEN: Well nourished, well developed, in no acute distress  Cardiac: RRR; no murmurs, rubs, or gallops,no edema -  Respiratory:  normal respiratory rate and pattern with no distress - normal breath sounds with no rales, rhonchi, wheezes or rubs GI: normal bowel sounds, no masses or tenderness MS: no deformity or atrophy  Skin: warm and dry, no rash  Neuro:  Alert and Oriented x 3, Strength and sensation are intact - CN II-Xii grossly intact Psych: euthymic mood, appropriate affect and demeanor   Lab Results  Component Value Date   WBC 5.9 08/29/2021   HGB 13.3 08/29/2021   HCT 38.9 08/29/2021   PLT 177 08/29/2021   GLUCOSE 113 (H) 05/31/2022   CHOL 162 01/10/2022   TRIG 319 (H) 01/10/2022   HDL 42 01/10/2022   LDLCALC 69 01/10/2022   ALT 52 (H) 01/10/2022   AST 56 (H) 01/10/2022   NA 140 05/31/2022   K 4.4 05/31/2022   CL 101 05/31/2022   CREATININE 0.69 (L) 05/31/2022   BUN 8 05/31/2022   CO2 24 05/31/2022   TSH 1.750 08/29/2021   INR 1.4 (H) 10/04/2020   HGBA1C 7.2 (H) 01/10/2022      Assessment & Plan:   Problem List Items Addressed This Visit       Cardiovascular and Mediastinum   Essential hypertension - Primary- uncontrolled   Relevant Orders   Comprehensive metabolic panel   Hemoglobin A1c Increase losartan to 50mg  qd     Other   Hyperlipidemia   Relevant Orders   Lipid panel Continue meds Watch diet   Other Visit Diagnoses     Type 2 diabetes mellitus with hyperglycemia, without long-term current use of insulin (HCC)       Relevant Orders   Comprehensive metabolic panel   Hemoglobin A1c   Lipid panel Continue meds Urine microalbumin ordered     .GERD Continue nexium  Alcohol dependence Efforts at cessation  Colon screening Cologuard ordered  Cervical radiculopathy Exercises/rom Rx for prednisone taper  S/p aortic valve replacement and aortoplasty Continue follow up with  cardiology  Meds ordered this encounter  Medications   losartan (COZAAR) 50 MG tablet    Sig: Take 1 tablet (50 mg total) by mouth daily.  Dispense:  90 tablet    Refill:  1    Order Specific Question:   Supervising Provider    Answer:   Shelton Silvas   predniSONE (DELTASONE) 20 MG tablet    Sig: Take 3 tablets (60 mg total) by mouth daily with breakfast for 3 days, THEN 2 tablets (40 mg total) daily with breakfast for 3 days, THEN 1 tablet (20 mg total) daily with breakfast for 3 days.    Dispense:  18 tablet    Refill:  0    Order Specific Question:   Supervising Provider    AnswerRochel Brome [phone]     Orders Placed This Encounter  Procedures   CBC with Differential/Platelet   Comprehensive metabolic panel   Lipid panel   Hemoglobin A1c   Cologuard   Microalbumin / creatinine urine ratio     Follow-up: Return in about 6 months (around 01/16/2023) for chronic fasting follow up.  An After Visit Summary was printed and given to the patient.  Yetta Flock [facility] [phone]

## 2022-07-17 LAB — CBC WITH DIFFERENTIAL/PLATELET
Basophils Absolute: 0.1 10*3/uL (ref 0.0–0.2)
Basos: 1 %
EOS (ABSOLUTE): 0.3 10*3/uL (ref 0.0–0.4)
Eos: 5 %
Hematocrit: 38.1 % (ref 37.5–51.0)
Hemoglobin: 13.1 g/dL (ref 13.0–17.7)
Immature Grans (Abs): 0 10*3/uL (ref 0.0–0.1)
Immature Granulocytes: 0 %
Lymphocytes Absolute: 2.2 10*3/uL (ref 0.7–3.1)
Lymphs: 37 %
MCH: 32.1 pg (ref 26.6–33.0)
MCHC: 34.4 g/dL (ref 31.5–35.7)
MCV: 93 fL (ref 79–97)
Monocytes Absolute: 0.5 10*3/uL (ref 0.1–0.9)
Monocytes: 8 %
Neutrophils Absolute: 2.9 10*3/uL (ref 1.4–7.0)
Neutrophils: 49 %
Platelets: 183 10*3/uL (ref 150–450)
RBC: 4.08 x10E6/uL — ABNORMAL LOW (ref 4.14–5.80)
RDW: 12.8 % (ref 11.6–15.4)
WBC: 6 10*3/uL (ref 3.4–10.8)

## 2022-07-17 LAB — COMPREHENSIVE METABOLIC PANEL
ALT: 28 IU/L (ref 0–44)
AST: 30 IU/L (ref 0–40)
Albumin/Globulin Ratio: 2.6 — ABNORMAL HIGH (ref 1.2–2.2)
Albumin: 4.9 g/dL (ref 3.8–4.9)
Alkaline Phosphatase: 54 IU/L (ref 44–121)
BUN/Creatinine Ratio: 12 (ref 9–20)
BUN: 8 mg/dL (ref 6–24)
Bilirubin Total: 0.7 mg/dL (ref 0.0–1.2)
CO2: 23 mmol/L (ref 20–29)
Calcium: 10.3 mg/dL — ABNORMAL HIGH (ref 8.7–10.2)
Chloride: 101 mmol/L (ref 96–106)
Creatinine, Ser: 0.68 mg/dL — ABNORMAL LOW (ref 0.76–1.27)
Globulin, Total: 1.9 g/dL (ref 1.5–4.5)
Glucose: 107 mg/dL — ABNORMAL HIGH (ref 70–99)
Potassium: 4.7 mmol/L (ref 3.5–5.2)
Sodium: 141 mmol/L (ref 134–144)
Total Protein: 6.8 g/dL (ref 6.0–8.5)
eGFR: 108 mL/min/{1.73_m2} (ref >59)

## 2022-07-17 LAB — LIPID PANEL
Chol/HDL Ratio: 3.3 ratio (ref 0.0–5.0)
Cholesterol, Total: 148 mg/dL (ref 100–199)
HDL: 45 mg/dL (ref >39)
LDL Chol Calc (NIH): 77 mg/dL (ref 0–99)
Triglycerides: 153 mg/dL — ABNORMAL HIGH (ref 0–149)
VLDL Cholesterol Cal: 26 mg/dL (ref 5–40)

## 2022-07-17 LAB — HEMOGLOBIN A1C
Est. average glucose Bld gHb Est-mCnc: 140 mg/dL
Hgb A1c MFr Bld: 6.5 % — ABNORMAL HIGH (ref 4.8–5.6)

## 2022-07-17 LAB — CARDIOVASCULAR RISK ASSESSMENT

## 2022-07-17 LAB — MICROALBUMIN / CREATININE URINE RATIO
Creatinine, Urine: 42.8 mg/dL
Microalb/Creat Ratio: 42 mg/g creat — ABNORMAL HIGH (ref 0–29)
Microalbumin, Urine: 17.8 ug/mL

## 2022-07-22 ENCOUNTER — Other Ambulatory Visit: Payer: Self-pay | Admitting: Physician Assistant

## 2022-07-22 DIAGNOSIS — E1165 Type 2 diabetes mellitus with hyperglycemia: Secondary | ICD-10-CM

## 2022-09-17 ENCOUNTER — Other Ambulatory Visit: Payer: Self-pay | Admitting: Cardiology

## 2022-09-17 NOTE — Telephone Encounter (Signed)
Rx refill sent to pharmacy. 

## 2022-10-17 ENCOUNTER — Other Ambulatory Visit: Payer: Self-pay | Admitting: Physician Assistant

## 2022-10-17 DIAGNOSIS — E1165 Type 2 diabetes mellitus with hyperglycemia: Secondary | ICD-10-CM

## 2022-12-18 ENCOUNTER — Encounter: Payer: Self-pay | Admitting: Cardiology

## 2022-12-18 ENCOUNTER — Ambulatory Visit: Payer: BC Managed Care – PPO | Attending: Cardiology | Admitting: Cardiology

## 2022-12-18 VITALS — BP 152/70 | HR 74 | Ht 68.5 in | Wt 205.2 lb

## 2022-12-18 DIAGNOSIS — E782 Mixed hyperlipidemia: Secondary | ICD-10-CM | POA: Diagnosis not present

## 2022-12-18 DIAGNOSIS — R0609 Other forms of dyspnea: Secondary | ICD-10-CM

## 2022-12-18 DIAGNOSIS — E1165 Type 2 diabetes mellitus with hyperglycemia: Secondary | ICD-10-CM | POA: Diagnosis not present

## 2022-12-18 DIAGNOSIS — Z952 Presence of prosthetic heart valve: Secondary | ICD-10-CM

## 2022-12-18 MED ORDER — AMLODIPINE BESYLATE 5 MG PO TABS: 5.0000 mg | ORAL_TABLET | Freq: Every day | ORAL | 3 refills | Status: DC

## 2022-12-18 NOTE — Addendum Note (Signed)
Addended by: Baldo Ash D on: 12/18/2022 04:13 PM   Modules accepted: Orders

## 2022-12-18 NOTE — Progress Notes (Signed)
Cardiology Office Note:    Date:  12/18/2022   ID:  Savian, Habetz 09-Aug-1965, MRN [id number]  PCP:  Marianne Sofia, PA-C  Cardiologist:  Gypsy Balsam, MD    Referring MD: Marianne Sofia, PA-C   Chief Complaint  Patient presents with   Follow-up  Well  History of Present Illness:    Ronnie Ruiz is a 57 y.o. male  with complex past medical history.  He does have history of bicuspid aortic valve as well as ascending aortic aneurysm he did have a Bentall procedure done in 2020.  Also type 2 diabetes, essential hypertension, dyslipidemia, history of ATH abuse.  Gastroesophageal reflux disease.  He was doing quite well until May at that time he did have some cortisone shot done in his left shoulder and then started having difficulty controlling his diabetes on top of that he started having night sweats as well as some spikes of fever.  He end up coming to [facility] he did have positive blood cultures for Aggregatibacter, he was started on ceftriaxone, eventually ended being transferred to [facility] transesophageal echocardiogram being done which showed some possibility of abscess then cardiac CT showed perivalvular abscess.  He was brought back to the operating room and had redo sternotomy with replacing of the ascending aortic graft using 28 mm Hemashield platinum graft and redo Bentall procedure with 23 mm aortic homograft valve/root.  Comes to [facility] for follow-up for losing very well.  He denies of any chest pain tightness squeezing pressure burning chest no palpitations no swelling of lower EXTR.  Still with heart defect will have no difficulty doing  Past Medical History:  Diagnosis Date   Alcohol dependency (HCC) 06/24/2018   Alcoholism (HCC) 06/24/2018   Aortic stenosis    Aortic valve abscess    Ascending aortic aneurysm (HCC) 41 mm as measured by echo 07/02/2018   Atherosclerotic heart disease 06/24/2018   Bacteremia 09/26/2020   Bicuspid aortic valve 06/25/2018   Critical  aortic valve stenosis mean gradient 64 mmHg, 06/25/2018   Dental caries    Diabetes mellitus type 2, controlled (HCC) 06/24/2018   Dyspnea    Endocarditis 09/26/2020   Essential hypertension 06/24/2018   Fatigue 09/15/2020   GERD (gastroesophageal reflux disease) 06/24/2018   Heart murmur 06/24/2018   Hyperlipidemia 06/24/2018   Night sweats 09/15/2020   Obesity, diabetes, and hypertension syndrome (HCC) 02/28/2020   Protein-calorie malnutrition, severe 09/28/2020   S/P aortic valve replacement and aortoplasty 10/04/2020   S/P AVR 09/22/2018   Biological Bentall Procedure using a 25 mm Edwards Magna-Ease pericardial valve and a 28 mm Gelweave Valsalva graft.   Secondary adhesive capsulitis of left shoulder 08/31/2020   Thoracic ascending aortic aneurysm [facility])     Past Surgical History:  Procedure Laterality Date   BENTALL PROCEDURE N/A 09/10/2018   Procedure: BENTALL PROCEDURE USING HEMASHIELD 28, GELWEAVE VALSALVA , AND MAGNA EASE ;  Surgeon: [name], MD;  Location: [facility] OR;  Service: Open Heart Surgery;  Laterality: N/A;   BENTALL PROCEDURE N/A 10/04/2020   Procedure: REDO BENTALL PROCEDURE ON PUMP WITH A HEMASHIELD PLATINUM GRAFT AND HOMOGRAFT;  Surgeon: [name], MD;  Location: [facility] OR;  Service: Open Heart Surgery;  Laterality: N/A;  AXILLARY CANNULATION WITH GRAFT   BUBBLE STUDY  09/28/2020   Procedure: BUBBLE STUDY;  Surgeon: Parke Poisson, MD;  Location: [facility] ENDOSCOPY;  Service: Cardiology;;   CHOLECYSTECTOMY     ENDOVEIN HARVEST OF GREATER  SAPHENOUS VEIN Right 10/04/2020   Procedure: ENDOVEIN HARVEST OF GREATER SAPHENOUS VEIN;  Surgeon: [name], MD;  Location: [facility] OR;  Service: Open Heart Surgery;  Laterality: Right;   Heart valve replaced revision  2022   RIGHT HEART CATH AND CORONARY ANGIOGRAPHY N/A 07/07/2018   Procedure: RIGHT HEART CATH AND CORONARY ANGIOGRAPHY;  Surgeon: Lennette Bihari, MD;  Location: [facility] INVASIVE CV LAB;  Service: Cardiovascular;   Laterality: N/A;   TEE WITHOUT CARDIOVERSION N/A 09/10/2018   Procedure: TRANSESOPHAGEAL ECHOCARDIOGRAM (TEE);  Surgeon: [name], MD;  Location: [facility] OR;  Service: Open Heart Surgery;  Laterality: N/A;   TEE WITHOUT CARDIOVERSION N/A 09/28/2020   Procedure: TRANSESOPHAGEAL ECHOCARDIOGRAM (TEE);  Surgeon: Parke Poisson, MD;  Location: [facility] ENDOSCOPY;  Service: Cardiology;  Laterality: N/A;   TEE WITHOUT CARDIOVERSION N/A 10/04/2020   Procedure: TRANSESOPHAGEAL ECHOCARDIOGRAM (TEE);  Surgeon: [name], MD;  Location[facility] OR;  Service: Open Heart Surgery;  Laterality: N/A;    Current Medications: Current Meds  Medication Sig   aspirin EC 325 MG EC tablet Take 1 tablet (325 mg total) by mouth daily.   atorvastatin (LIPITOR) 80 MG tablet Take 1 tablet (80 mg total) by mouth daily.   docusate sodium (COLACE) 250 MG capsule Take 250 mg by mouth daily as needed for constipation.   esomeprazole (NEXIUM) 20 MG capsule Take 20 mg by mouth every evening.   ferrous fumarate-b12-vitamic C-folic acid (TRINSICON / FOLTRIN) capsule Take 1 capsule by mouth 2 (two) times daily after a meal.   losartan (COZAAR) 50 MG tablet Take 1 tablet (50 mg total) by mouth daily. (Patient taking differently: Take 100 mg by mouth daily.)   metFORMIN (GLUCOPHAGE) 1000 MG tablet TAKE 1 TABLET(1000 MG) BY MOUTH TWICE DAILY WITH A MEAL (Patient taking differently: Take 1,000 mg by mouth 2 (two) times daily with a meal.)   metoprolol tartrate (LOPRESSOR) 25 MG tablet Take 0.5 tablets (12.5 mg total) by mouth 2 (two) times daily.   Omega-3 Fatty Acids (FISH OIL) 1000 MG CAPS Take 1,000 mg by mouth 2 (two) times daily.      Allergies:   Patient has no known allergies.   Social History   Socioeconomic History   Marital status: Married    Spouse name: Not on file   Number of children: Not on file   Years of education: Not on file   Highest education level: Not on file  Occupational History   Not on file   Tobacco Use   Smoking status: Never   Smokeless tobacco: Former    Types: Snuff  Vaping Use   Vaping status: Never Used  Substance and Sexual Activity   Alcohol use: Not Currently    Alcohol/week: 2.0 standard drinks of alcohol    Types: 2 Cans of beer per week    Comment: daily   Drug use: Never   Sexual activity: Not Currently  Other Topics Concern   Not on file  Social History Narrative   Not on file   Social Determinants of Health   Financial Resource Strain: Not on file  Food Insecurity: Not on file  Transportation Needs: Not on file  Physical Activity: Not on file  Stress: Not on file  Social Connections: Not on file     Family History: The patient's family history includes Alcoholism in his father; Anxiety disorder in his father; CAD in his father; Hyperlipidemia in his father; Lung cancer in his father and mother; Pulmonary disease  in his father and mother; Renal cancer in his father. ROS:   Please see the history of present illness.    All 14 point review of systems negative except as described per history of present illness  EKGs/Labs/Other Studies Reviewed:         Recent Labs: 07/16/2022: ALT 28; BUN 8; Creatinine, Ser 0.68; Hemoglobin 13.1; Platelets 183; Potassium 4.7; Sodium 141  Recent Lipid Panel    Component Value Date/Time   CHOL 148 07/16/2022 0848   TRIG 153 (H) 07/16/2022 0848   HDL 45 07/16/2022 0848   CHOLHDL 3.3 07/16/2022 0848   LDLCALC 77 07/16/2022 0848    Physical Exam:    VS:  BP (!) 152/70 (BP Location: Left Arm, Patient Position: Sitting)   Pulse 74   Ht 5' 8.5" (1.74 m)   Wt 205 lb 3.2 oz (93.1 kg)   SpO2 90%   BMI 30.75 kg/m     Wt Readings from Last 3 Encounters:  12/18/22 205 lb 3.2 oz (93.1 kg)  07/16/22 209 lb (94.8 kg)  05/09/22 212 lb 12.8 oz (96.5 kg)     GEN:  Well nourished, well developed in no acute distress HEENT: Normal NECK: No JVD; No carotid bruits LYMPHATICS: No lymphadenopathy CARDIAC: RRR,  systolic ejection murmur grade 1/6 to 2/6 best heard right upper portion of the sternum, no rubs, no gallops RESPIRATORY:  Clear to auscultation without rales, wheezing or rhonchi  ABDOMEN: Soft, non-tender, non-distended MUSCULOSKELETAL:  No edema; No deformity  SKIN: Warm and dry LOWER EXTREMITIES: no swelling NEUROLOGIC:  Alert and oriented x 3 PSYCHIATRIC:  Normal affect   ASSESSMENT:    1. S/P aortic valve replacement and aortoplasty   2. S/P AVR   3. Mixed hyperlipidemia   4. Type 2 diabetes mellitus with hyperglycemia, without long-term current use of insulin (HCC)    PLAN:    In order of problems listed above:  Status post aortic valve replacement with aortic root replacement, there was in face of endocarditis.  However initial insult was aortic aneurysm with bicuspid aortic valve and stenosis second surgery was done 2 years ago he did see surgeon about a year ago recommendation was to follow-up with periodic echoes with no need to do CT of the chest.  Therefore we will schedule to have echocardiogram to assess the valve. Essential hypertension, still uncontrolled.  Will add amlodipine 5 mg daily to medical regiment we will bring him back to the office in 2 3 weeks to check blood pressure. Mixed dyslipidemia I did review K PN which show me his LDL 77 HDL 45 will continue present management with high intensity statin of Lipitor. Type 2 diabetes followed by antimedicine team last hemoglobin A1c is 6.5 acceptable number continue present management   Medication Adjustments/Labs and Tests Ordered: Current medicines are reviewed at length with the patient today.  Concerns regarding medicines are outlined above.  No orders of the defined types were placed in this encounter.  Medication changes: No orders of the defined types were placed in this encounter.   Signed, Georgeanna Lea, MD, [facility] 12/18/2022 3:58 PM    [facility]

## 2022-12-18 NOTE — Patient Instructions (Addendum)
Medication Instructions:   START: Amlodipine 5mg  1 tablet daily   Lab Work: None Ordered If you have labs (blood work) drawn today and your tests are completely normal, you will receive your results only by: MyChart Message (if you have MyChart) OR A paper copy in the mail If you have any lab test that is abnormal or we need to change your treatment, we will call you to review the results.   Testing/Procedures: Your physician has requested that you have an echocardiogram. Echocardiography is a painless test that uses sound waves to create images of your heart. It provides your doctor with information about the size and shape of your heart and how well your heart's chambers and valves are working. This procedure takes approximately one hour. There are no restrictions for this procedure. Please do NOT wear cologne, perfume, aftershave, or lotions (deodorant is allowed). Please arrive 15 minutes prior to your appointment time.    Follow-Up: At [facility], you and your health needs are our priority.  As part of our continuing mission to provide you with exceptional heart care, we have created designated Provider Care Teams.  These Care Teams include your primary Cardiologist (physician) and Advanced Practice Providers (APPs -  Physician Assistants and Nurse Practitioners) who all work together to provide you with the care you need, when you need it.  We recommend signing up for the patient portal called "MyChart".  Sign up information is provided on this After Visit Summary.  MyChart is used to connect with patients for Virtual Visits (Telemedicine).  Patients are able to view lab/test results, encounter notes, upcoming appointments, etc.  Non-urgent messages can be sent to your provider as well.   To learn more about what you can do with MyChart, go to [email or web address].    Your next appointment:   6 month(s)  The format for your next appointment:   In Person  Provider:    Gypsy Balsam, MD    Other Instructions NA

## 2023-01-01 ENCOUNTER — Ambulatory Visit: Payer: BC Managed Care – PPO

## 2023-01-01 MED ORDER — LOSARTAN POTASSIUM 100 MG PO TABS: 100.0000 mg | ORAL_TABLET | Freq: Every day | ORAL | 3 refills | Status: DC

## 2023-01-01 NOTE — Progress Notes (Signed)
   Nurse Visit   Date of Encounter: 01/01/2023 ID: Ronnie Ruiz, DOB July 11, 1965, MRN [id number]  PCP:  Marianne Sofia, PA-C   [facility] Providers Cardiologist:  Gypsy Balsam, MD      Visit Details   VS:  BP (!) 150/70 (BP Location: Left Arm, Patient Position: Sitting, Cuff Size: Normal)   Pulse 70   Ht 5\' 8"  (1.727 m)   Wt 209 lb 9.6 oz (95.1 kg)   SpO2 97%   BMI 31.87 kg/m  , BMI Body mass index is 31.87 kg/m.  Wt Readings from Last 3 Encounters:  01/01/23 209 lb 9.6 oz (95.1 kg)  12/18/22 205 lb 3.2 oz (93.1 kg)  07/16/22 209 lb (94.8 kg)     Reason for visit: Perform blood pressure check Performed today: Vitals, Provider consulted and Education Changes (medications, testing, etc.) : Losartan increased to 100 mg daily Length of Visit: 20 minutes    Medications Adjustments/Labs and Tests Ordered: No orders of the defined types were placed in this encounter.  No orders of the defined types were placed in this encounter.    Signed, Samson Frederic, RN  01/01/2023 4:07 PM

## 2023-01-19 ENCOUNTER — Other Ambulatory Visit: Payer: Self-pay | Admitting: Physician Assistant

## 2023-01-19 DIAGNOSIS — E1165 Type 2 diabetes mellitus with hyperglycemia: Secondary | ICD-10-CM

## 2023-01-22 ENCOUNTER — Other Ambulatory Visit: Payer: BC Managed Care – PPO

## 2023-01-23 ENCOUNTER — Encounter: Payer: Self-pay | Admitting: Physician Assistant

## 2023-01-23 ENCOUNTER — Ambulatory Visit: Payer: BC Managed Care – PPO | Admitting: Physician Assistant

## 2023-01-23 VITALS — BP 130/60 | HR 68 | Temp 97.7°F | Ht 68.0 in | Wt 213.6 lb

## 2023-01-23 DIAGNOSIS — I10 Essential (primary) hypertension: Secondary | ICD-10-CM

## 2023-01-23 DIAGNOSIS — E7800 Pure hypercholesterolemia, unspecified: Secondary | ICD-10-CM

## 2023-01-23 DIAGNOSIS — E1165 Type 2 diabetes mellitus with hyperglycemia: Secondary | ICD-10-CM | POA: Diagnosis not present

## 2023-01-23 DIAGNOSIS — Z23 Encounter for immunization: Secondary | ICD-10-CM

## 2023-01-23 DIAGNOSIS — Z125 Encounter for screening for malignant neoplasm of prostate: Secondary | ICD-10-CM | POA: Diagnosis not present

## 2023-01-23 DIAGNOSIS — R899 Unspecified abnormal finding in specimens from other organs, systems and tissues: Secondary | ICD-10-CM | POA: Diagnosis not present

## 2023-01-23 NOTE — Progress Notes (Signed)
Subjective:  Patient ID: Ronnie Ruiz, male    DOB: 1965-10-13  Age: 57 y.o. MRN: [id number]  Chief Complaint  Patient presents with   Medical Management of Chronic Issues    HPI  Pt presents for follow up of hypertension. The patient is tolerating the medication well without side effects. Compliance with treatment has been good; including taking medication as directed , maintains a healthy diet and regular exercise regimen , and following up as directed. Pt currently taking metoprolol 25mg  bid and losartan 100mg  qd and norvasc 5mg  - denies chest pain or dyspnea  Mixed hyperlipidemia  Pt presents with hyperlipidemia.  Compliance with treatment has been good - The patient is compliant with medications, maintains a low cholesterol diet , follows up as directed , and maintains an exercise regimen . The patient denies experiencing any hypercholesterolemia related symptoms. Currently on lipitor 80mg  qd and fish oil  Pt with history of diabetes - glucose has been stable and last hgb a1c was good - he is currently on glucophage 1000 mg - pt states he has not been checking his glucose at home He is due for eye exam  Pt with history of aortic valve replacement and aortoplasty and currently follows with cardiology with an appt tomorrow-   Pt still has cologuard to turn in  Pt does have history of alcohol dependence - is drinking 6-8 beers daily  Pt would like flu shot  Current Outpatient Medications on File Prior to Visit  Medication Sig Dispense Refill   amLODipine (NORVASC) 5 MG tablet Take 1 tablet (5 mg total) by mouth daily. 180 tablet 3   aspirin EC 325 MG EC tablet Take 1 tablet (325 mg total) by mouth daily.     atorvastatin (LIPITOR) 80 MG tablet Take 1 tablet (80 mg total) by mouth daily. 90 tablet 2   docusate sodium (COLACE) 250 MG capsule Take 250 mg by mouth daily as needed for constipation.     esomeprazole (NEXIUM) 20 MG capsule Take 20 mg by mouth every evening.     ferrous  fumarate-b12-vitamic C-folic acid (TRINSICON / FOLTRIN) capsule Take 1 capsule by mouth 2 (two) times daily after a meal. 60 capsule 2   losartan (COZAAR) 100 MG tablet Take 1 tablet (100 mg total) by mouth daily. 90 tablet 3   metFORMIN (GLUCOPHAGE) 1000 MG tablet TAKE 1 TABLET(1000 MG) BY MOUTH TWICE DAILY WITH A MEAL 60 tablet 2   metoprolol tartrate (LOPRESSOR) 25 MG tablet Take 0.5 tablets (12.5 mg total) by mouth 2 (two) times daily. 90 tablet 2   Omega-3 Fatty Acids (FISH OIL) 1000 MG CAPS Take 1,000 mg by mouth 2 (two) times daily.      No current facility-administered medications on file prior to visit.   Past Medical History:  Diagnosis Date   Alcohol dependency (HCC) 06/24/2018   Alcoholism (HCC) 06/24/2018   Aortic stenosis    Aortic valve abscess    Ascending aortic aneurysm (HCC) 41 mm as measured by echo 07/02/2018   Atherosclerotic heart disease 06/24/2018   Bacteremia 09/26/2020   Bicuspid aortic valve 06/25/2018   Critical aortic valve stenosis mean gradient 64 mmHg, 06/25/2018   Dental caries    Diabetes mellitus type 2, controlled (HCC) 06/24/2018   Dyspnea    Endocarditis 09/26/2020   Essential hypertension 06/24/2018   Fatigue 09/15/2020   GERD (gastroesophageal reflux disease) 06/24/2018   Heart murmur 06/24/2018   Hyperlipidemia 06/24/2018   Night sweats 09/15/2020  Obesity, diabetes, and hypertension syndrome (HCC) 02/28/2020   Protein-calorie malnutrition, severe 09/28/2020   S/P aortic valve replacement and aortoplasty 10/04/2020   S/P AVR 09/22/2018   Biological Bentall Procedure using a 25 mm Edwards Magna-Ease pericardial valve and a 28 mm Gelweave Valsalva graft.   Secondary adhesive capsulitis of left shoulder 08/31/2020   Thoracic ascending aortic aneurysm [facility])    Past Surgical History:  Procedure Laterality Date   BENTALL PROCEDURE N/A 09/10/2018   Procedure: BENTALL PROCEDURE USING HEMASHIELD 28, GELWEAVE VALSALVA , AND MAGNA EASE ;  Surgeon: [name], MD;   Location: [facility] OR;  Service: Open Heart Surgery;  Laterality: N/A;   BENTALL PROCEDURE N/A 10/04/2020   Procedure: REDO BENTALL PROCEDURE ON PUMP WITH A HEMASHIELD PLATINUM GRAFT AND HOMOGRAFT;  Surgeon: [name], MD;  Location: [facility] OR;  Service: Open Heart Surgery;  Laterality: N/A;  AXILLARY CANNULATION WITH GRAFT   BUBBLE STUDY  09/28/2020   Procedure: BUBBLE STUDY;  Surgeon: Parke Poisson, MD;  Location: [facility] ENDOSCOPY;  Service: Cardiology;;   CHOLECYSTECTOMY     ENDOVEIN HARVEST OF GREATER SAPHENOUS VEIN Right 10/04/2020   Procedure: ENDOVEIN HARVEST OF GREATER SAPHENOUS VEIN;  Surgeon: [name], MD;  Location: [facility] OR;  Service: Open Heart Surgery;  Laterality: Right;   Heart valve replaced revision  2022   RIGHT HEART CATH AND CORONARY ANGIOGRAPHY N/A 07/07/2018   Procedure: RIGHT HEART CATH AND CORONARY ANGIOGRAPHY;  Surgeon: Lennette Bihari, MD;  Location: [facility] INVASIVE CV LAB;  Service: Cardiovascular;  Laterality: N/A;   TEE WITHOUT CARDIOVERSION N/A 09/10/2018   Procedure: TRANSESOPHAGEAL ECHOCARDIOGRAM (TEE);  Surgeon: [name], MD;  Location: [facility] OR;  Service: Open Heart Surgery;  Laterality: N/A;   TEE WITHOUT CARDIOVERSION N/A 09/28/2020   Procedure: TRANSESOPHAGEAL ECHOCARDIOGRAM (TEE);  Surgeon: Parke Poisson, MD;  Location: [facility] ENDOSCOPY;  Service: Cardiology;  Laterality: N/A;   TEE WITHOUT CARDIOVERSION N/A 10/04/2020   Procedure: TRANSESOPHAGEAL ECHOCARDIOGRAM (TEE);  Surgeon: [name], MD;  Location: [facility] OR;  Service: Open Heart Surgery;  Laterality: N/A;    Family History  Problem Relation Age of Onset   Lung cancer Mother    Pulmonary disease Mother    Alcoholism Father    Anxiety disorder Father    Hyperlipidemia Father    Lung cancer Father    CAD Father    Renal cancer Father    Pulmonary disease Father    Social History   Socioeconomic History   Marital status: Married    Spouse name: Not on file   Number of  children: Not on file   Years of education: Not on file   Highest education level: Not on file  Occupational History   Not on file  Tobacco Use   Smoking status: Never   Smokeless tobacco: Former    Types: Snuff  Vaping Use   Vaping status: Never Used  Substance and Sexual Activity   Alcohol use: Not Currently    Alcohol/week: 2.0 standard drinks of alcohol    Types: 2 Cans of beer per week    Comment: daily   Drug use: Never   Sexual activity: Not Currently  Other Topics Concern   Not on file  Social History Narrative   Not on file   Social Determinants of Health   Financial Resource Strain: Low Risk  (01/23/2023)   Overall Financial Resource Strain (CARDIA)    Difficulty of Paying Living Expenses: Not  hard at all  Food Insecurity: No Food Insecurity (01/23/2023)   Hunger Vital Sign    Worried About Running Out of Food in the Last Year: Never true    Ran Out of Food in the Last Year: Never true  Transportation Needs: No Transportation Needs (01/23/2023)   PRAPARE - Administrator, Civil Service (Medical): No    Lack of Transportation (Non-Medical): No  Physical Activity: Insufficiently Active (01/23/2023)   Exercise Vital Sign    Days of Exercise per Week: 5 days    Minutes of Exercise per Session: 20 min  Stress: No Stress Concern Present (01/23/2023)   [employer] of Occupational Health - Occupational Stress Questionnaire    Feeling of Stress : Not at all  Social Connections: Moderately Isolated (01/23/2023)   Social Connection and Isolation Panel [NHANES]    Frequency of Communication with Friends and Family: More than three times a week    Frequency of Social Gatherings with Friends and Family: Three times a week    Attends Religious Services: Never    Active Member of Clubs or Organizations: No    Attends Banker Meetings: Never    Marital Status: Living with partner   CONSTITUTIONAL: Negative for chills, fatigue, fever, unintentional  weight gain and unintentional weight loss.  E/N/T: Negative for ear pain, nasal congestion and sore throat.  CARDIOVASCULAR: Negative for chest pain, dizziness, palpitations and pedal edema.  RESPIRATORY: Negative for recent cough and dyspnea.  GASTROINTESTINAL: Negative for abdominal pain, acid reflux symptoms, constipation, diarrhea, nausea and vomiting.  MSK: Negative for arthralgias and myalgias.  INTEGUMENTARY: Negative for rash.  NEUROLOGICAL: Negative for dizziness and headaches.  PSYCHIATRIC: Negative for sleep disturbance and to question depression screen.  Negative for depression, negative for anhedonia.       Objective:  PHYSICAL EXAM:   VS: BP 130/60 (BP Location: Left Arm, Patient Position: Sitting, Cuff Size: Large)   Pulse 68   Temp 97.7 F (36.5 C) (Temporal)   Ht 5\' 8"  (1.727 m)   Wt 213 lb 9.6 oz (96.9 kg)   SpO2 94%   BMI 32.48 kg/m   GEN: Well nourished, well developed, in no acute distress  Cardiac: RRR; no murmurs, rubs, or gallops,no edema - Respiratory:  normal respiratory rate and pattern with no distress - normal breath sounds with no rales, rhonchi, wheezes or rubs Skin: warm and dry, no rash  Neuro:  Alert and Oriented x 3, - CN II-Xii grossly intact Psych: euthymic mood, appropriate affect and demeanor   Lab Results  Component Value Date   WBC 6.0 07/16/2022   HGB 13.1 07/16/2022   HCT 38.1 07/16/2022   PLT 183 07/16/2022   GLUCOSE 107 (H) 07/16/2022   CHOL 148 07/16/2022   TRIG 153 (H) 07/16/2022   HDL 45 07/16/2022   LDLCALC 77 07/16/2022   ALT 28 07/16/2022   AST 30 07/16/2022   NA 141 07/16/2022   K 4.7 07/16/2022   CL 101 07/16/2022   CREATININE 0.68 (L) 07/16/2022   BUN 8 07/16/2022   CO2 23 07/16/2022   TSH 1.750 08/29/2021   INR 1.4 (H) 10/04/2020   HGBA1C 6.5 (H) 07/16/2022      Assessment & Plan:   Problem List Items Addressed This Visit       Cardiovascular and Mediastinum   Essential hypertension - Primary-  uncontrolled   Relevant Orders   Comprehensive metabolic panel    Include current meds as directed  Other   Hyperlipidemia   Relevant Orders   Lipid panel Continue meds Watch diet   Other Visit Diagnoses     Type 2 diabetes mellitus with hyperglycemia, without long-term current use of insulin (HCC)       Relevant Orders   Comprehensive metabolic panel   Hemoglobin A1c   Lipid panel Continue meds      .GERD Continue nexium  Alcohol dependence Efforts at cessation  S/p aortic valve replacement and aortoplasty Continue follow up with cardiology  Need flu shot Flucelvax given  No orders of the defined types were placed in this encounter.    Orders Placed This Encounter  Procedures   Influenza, MDCK, trivalent, PF(Flucelvax egg-free)   CBC with Differential/Platelet   Comprehensive metabolic panel   TSH   Lipid panel   PSA   Hemoglobin A1c     Follow-up: Return in about 6 months (around 07/24/2023).  An After Visit Summary was printed and given to the patient.  Jettie Pagan [facility] [phone]

## 2023-01-24 ENCOUNTER — Ambulatory Visit: Payer: BC Managed Care – PPO | Attending: Cardiology

## 2023-01-24 ENCOUNTER — Other Ambulatory Visit: Payer: Self-pay | Admitting: Physician Assistant

## 2023-01-24 DIAGNOSIS — R899 Unspecified abnormal finding in specimens from other organs, systems and tissues: Secondary | ICD-10-CM

## 2023-01-24 DIAGNOSIS — R0609 Other forms of dyspnea: Secondary | ICD-10-CM | POA: Diagnosis not present

## 2023-01-24 LAB — CBC WITH DIFFERENTIAL/PLATELET
Basophils Absolute: 0.1 10*3/uL (ref 0.0–0.2)
Basos: 1 %
EOS (ABSOLUTE): 0.4 10*3/uL (ref 0.0–0.4)
Eos: 7 %
Hematocrit: 37.1 % — ABNORMAL LOW (ref 37.5–51.0)
Hemoglobin: 12.5 g/dL — ABNORMAL LOW (ref 13.0–17.7)
Immature Grans (Abs): 0 10*3/uL (ref 0.0–0.1)
Immature Granulocytes: 1 %
Lymphocytes Absolute: 2 10*3/uL (ref 0.7–3.1)
Lymphs: 39 %
MCH: 33.1 pg — ABNORMAL HIGH (ref 26.6–33.0)
MCHC: 33.7 g/dL (ref 31.5–35.7)
MCV: 98 fL — ABNORMAL HIGH (ref 79–97)
Monocytes Absolute: 0.4 10*3/uL (ref 0.1–0.9)
Monocytes: 7 %
Neutrophils Absolute: 2.4 10*3/uL (ref 1.4–7.0)
Neutrophils: 45 %
Platelets: 170 10*3/uL (ref 150–450)
RBC: 3.78 x10E6/uL — ABNORMAL LOW (ref 4.14–5.80)
RDW: 12 % (ref 11.6–15.4)
WBC: 5.2 10*3/uL (ref 3.4–10.8)

## 2023-01-24 LAB — LIPID PANEL
Chol/HDL Ratio: 2.9 {ratio} (ref 0.0–5.0)
Cholesterol, Total: 147 mg/dL (ref 100–199)
HDL: 51 mg/dL (ref >39)
LDL Chol Calc (NIH): 72 mg/dL (ref 0–99)
Triglycerides: 136 mg/dL (ref 0–149)
VLDL Cholesterol Cal: 24 mg/dL (ref 5–40)

## 2023-01-24 LAB — COMPREHENSIVE METABOLIC PANEL
ALT: 33 [IU]/L (ref 0–44)
AST: 37 [IU]/L (ref 0–40)
Albumin: 4.7 g/dL (ref 3.8–4.9)
Alkaline Phosphatase: 55 [IU]/L (ref 44–121)
BUN/Creatinine Ratio: 10 (ref 9–20)
BUN: 7 mg/dL (ref 6–24)
Bilirubin Total: 0.7 mg/dL (ref 0.0–1.2)
CO2: 25 mmol/L (ref 20–29)
Calcium: 10.2 mg/dL (ref 8.7–10.2)
Chloride: 101 mmol/L (ref 96–106)
Creatinine, Ser: 0.67 mg/dL — ABNORMAL LOW (ref 0.76–1.27)
Globulin, Total: 2 g/dL (ref 1.5–4.5)
Glucose: 97 mg/dL (ref 70–99)
Potassium: 4.6 mmol/L (ref 3.5–5.2)
Sodium: 143 mmol/L (ref 134–144)
Total Protein: 6.7 g/dL (ref 6.0–8.5)
eGFR: 108 mL/min/{1.73_m2} (ref >59)

## 2023-01-24 LAB — ECHOCARDIOGRAM COMPLETE
AR max vel: 2.47 cm2
AV Area VTI: 2.71 cm2
AV Area mean vel: 2.64 cm2
AV Mean grad: 5 mm[Hg]
AV Peak grad: 12.8 mm[Hg]
Ao pk vel: 1.79 m/s
Area-P 1/2: 4.58 cm2
MV M vel: 4.8 m/s
MV Peak grad: 92 mm[Hg]
S' Lateral: 3.7 cm

## 2023-01-24 LAB — HEMOGLOBIN A1C
Est. average glucose Bld gHb Est-mCnc: 137 mg/dL
Hgb A1c MFr Bld: 6.4 % — ABNORMAL HIGH (ref 4.8–5.6)

## 2023-01-24 LAB — TSH: TSH: 2.22 u[IU]/mL (ref 0.450–4.500)

## 2023-01-24 LAB — PSA: Prostate Specific Ag, Serum: 0.8 ng/mL (ref 0.0–4.0)

## 2023-01-27 ENCOUNTER — Telehealth: Payer: Self-pay

## 2023-01-27 NOTE — Telephone Encounter (Signed)
Left message on My Chart with normal results per Dr. Hulen Shouts note. Routed to PCP.

## 2023-01-28 ENCOUNTER — Other Ambulatory Visit: Payer: Self-pay | Admitting: Physician Assistant

## 2023-01-28 DIAGNOSIS — R899 Unspecified abnormal finding in specimens from other organs, systems and tissues: Secondary | ICD-10-CM

## 2023-01-28 LAB — SPECIMEN STATUS REPORT

## 2023-01-28 LAB — IRON,TIBC AND FERRITIN PANEL
Ferritin: 126 ng/mL (ref 30–400)
Iron Saturation: 29 % (ref 15–55)
Iron: 91 ug/dL (ref 38–169)
Total Iron Binding Capacity: 314 ug/dL (ref 250–450)
UIBC: 223 ug/dL (ref 111–343)

## 2023-01-31 ENCOUNTER — Other Ambulatory Visit: Payer: Self-pay

## 2023-03-03 ENCOUNTER — Other Ambulatory Visit: Payer: BC Managed Care – PPO

## 2023-03-03 DIAGNOSIS — R899 Unspecified abnormal finding in specimens from other organs, systems and tissues: Secondary | ICD-10-CM

## 2023-03-03 LAB — CBC WITH DIFFERENTIAL/PLATELET
Basophils Absolute: 0.1 10*3/uL (ref 0.0–0.2)
Basos: 2 %
EOS (ABSOLUTE): 0.4 10*3/uL (ref 0.0–0.4)
Eos: 6 %
Hematocrit: 35 % — ABNORMAL LOW (ref 37.5–51.0)
Hemoglobin: 12 g/dL — ABNORMAL LOW (ref 13.0–17.7)
Immature Grans (Abs): 0 10*3/uL (ref 0.0–0.1)
Immature Granulocytes: 0 %
Lymphocytes Absolute: 2.4 10*3/uL (ref 0.7–3.1)
Lymphs: 40 %
MCH: 33.3 pg — ABNORMAL HIGH (ref 26.6–33.0)
MCHC: 34.3 g/dL (ref 31.5–35.7)
MCV: 97 fL (ref 79–97)
Monocytes Absolute: 0.5 10*3/uL (ref 0.1–0.9)
Monocytes: 8 %
Neutrophils Absolute: 2.5 10*3/uL (ref 1.4–7.0)
Neutrophils: 44 %
Platelets: 173 10*3/uL (ref 150–450)
RBC: 3.6 x10E6/uL — ABNORMAL LOW (ref 4.14–5.80)
RDW: 11.3 % — ABNORMAL LOW (ref 11.6–15.4)
WBC: 5.9 10*3/uL (ref 3.4–10.8)

## 2023-03-05 ENCOUNTER — Other Ambulatory Visit: Payer: Self-pay | Admitting: Physician Assistant

## 2023-03-05 DIAGNOSIS — R899 Unspecified abnormal finding in specimens from other organs, systems and tissues: Secondary | ICD-10-CM

## 2023-03-10 ENCOUNTER — Other Ambulatory Visit: Payer: Self-pay | Admitting: Physician Assistant

## 2023-03-10 DIAGNOSIS — E1165 Type 2 diabetes mellitus with hyperglycemia: Secondary | ICD-10-CM

## 2023-03-26 ENCOUNTER — Other Ambulatory Visit (INDEPENDENT_AMBULATORY_CARE_PROVIDER_SITE_OTHER): Payer: Self-pay

## 2023-03-26 DIAGNOSIS — R899 Unspecified abnormal finding in specimens from other organs, systems and tissues: Secondary | ICD-10-CM

## 2023-03-26 LAB — HEMOCCULT GUIAC POC 1CARD (OFFICE): Fecal Occult Blood, POC: NEGATIVE

## 2023-04-30 ENCOUNTER — Other Ambulatory Visit: Payer: BC Managed Care – PPO

## 2023-04-30 DIAGNOSIS — R899 Unspecified abnormal finding in specimens from other organs, systems and tissues: Secondary | ICD-10-CM

## 2023-05-01 LAB — IRON,TIBC AND FERRITIN PANEL
Ferritin: 127 ng/mL (ref 30–400)
Iron Saturation: 21 % (ref 15–55)
Iron: 65 ug/dL (ref 38–169)
Total Iron Binding Capacity: 312 ug/dL (ref 250–450)
UIBC: 247 ug/dL (ref 111–343)

## 2023-05-01 LAB — CBC WITH DIFFERENTIAL/PLATELET
Basophils Absolute: 0.1 10*3/uL (ref 0.0–0.2)
Basos: 1 %
EOS (ABSOLUTE): 0.3 10*3/uL (ref 0.0–0.4)
Eos: 5 %
Hematocrit: 35.8 % — ABNORMAL LOW (ref 37.5–51.0)
Hemoglobin: 12.3 g/dL — ABNORMAL LOW (ref 13.0–17.7)
Immature Grans (Abs): 0 10*3/uL (ref 0.0–0.1)
Immature Granulocytes: 0 %
Lymphocytes Absolute: 2.2 10*3/uL (ref 0.7–3.1)
Lymphs: 37 %
MCH: 32.8 pg (ref 26.6–33.0)
MCHC: 34.4 g/dL (ref 31.5–35.7)
MCV: 96 fL (ref 79–97)
Monocytes Absolute: 0.5 10*3/uL (ref 0.1–0.9)
Monocytes: 8 %
Neutrophils Absolute: 3 10*3/uL (ref 1.4–7.0)
Neutrophils: 49 %
Platelets: 210 10*3/uL (ref 150–450)
RBC: 3.75 x10E6/uL — ABNORMAL LOW (ref 4.14–5.80)
RDW: 11.9 % (ref 11.6–15.4)
WBC: 6 10*3/uL (ref 3.4–10.8)

## 2023-06-08 ENCOUNTER — Other Ambulatory Visit: Payer: Self-pay | Admitting: Cardiology

## 2023-06-09 NOTE — Telephone Encounter (Signed)
 Prescription sent to pharmacy.

## 2023-07-24 ENCOUNTER — Encounter: Payer: Self-pay | Admitting: Physician Assistant

## 2023-07-24 ENCOUNTER — Ambulatory Visit: Payer: BC Managed Care – PPO | Admitting: Physician Assistant

## 2023-07-24 VITALS — BP 134/70 | HR 61 | Temp 97.8°F | Resp 18 | Ht 68.0 in | Wt 211.6 lb

## 2023-07-24 DIAGNOSIS — E1165 Type 2 diabetes mellitus with hyperglycemia: Secondary | ICD-10-CM

## 2023-07-24 DIAGNOSIS — E611 Iron deficiency: Secondary | ICD-10-CM | POA: Diagnosis not present

## 2023-07-24 DIAGNOSIS — I350 Nonrheumatic aortic (valve) stenosis: Secondary | ICD-10-CM

## 2023-07-24 DIAGNOSIS — E7800 Pure hypercholesterolemia, unspecified: Secondary | ICD-10-CM

## 2023-07-24 DIAGNOSIS — K219 Gastro-esophageal reflux disease without esophagitis: Secondary | ICD-10-CM

## 2023-07-24 DIAGNOSIS — I10 Essential (primary) hypertension: Secondary | ICD-10-CM | POA: Diagnosis not present

## 2023-07-24 DIAGNOSIS — M79601 Pain in right arm: Secondary | ICD-10-CM

## 2023-07-24 DIAGNOSIS — Z1211 Encounter for screening for malignant neoplasm of colon: Secondary | ICD-10-CM

## 2023-07-24 DIAGNOSIS — M79602 Pain in left arm: Secondary | ICD-10-CM

## 2023-07-24 DIAGNOSIS — I2510 Atherosclerotic heart disease of native coronary artery without angina pectoris: Secondary | ICD-10-CM | POA: Diagnosis not present

## 2023-07-24 DIAGNOSIS — Z23 Encounter for immunization: Secondary | ICD-10-CM

## 2023-07-24 MED ORDER — GABAPENTIN 300 MG PO CAPS
300.0000 mg | ORAL_CAPSULE | Freq: Every day | ORAL | 3 refills | Status: DC
Start: 2023-07-24 — End: 2023-11-19

## 2023-07-24 NOTE — Progress Notes (Signed)
 Subjective:  Patient ID: Ronnie Ruiz, male    DOB: 11/15/1965  Age: 58 y.o. MRN: [id number]  Chief Complaint  Patient presents with   Medical Management of Chronic Issues    HPI  Pt presents for follow up of hypertension. The patient is tolerating the medication well without side effects. Compliance with treatment has been good; including taking medication as directed , maintains a healthy diet and regular exercise regimen , and following up as directed. Pt currently taking metoprolol 25mg  bid , norvasc 5mg  and losartan 100 mg qd -   Mixed hyperlipidemia  Pt presents with hyperlipidemia.  Compliance with treatment has been good - The patient is compliant with medications, maintains a low cholesterol diet , follows up as directed , and maintains an exercise regimen . The patient denies experiencing any hypercholesterolemia related symptoms. 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  States at times both arms and shoulders hurt and feels tingling down both arms intermittently.  He does not take NSAIDs regularly.  States in past he had injections in shoulders which helped however his symptoms are consistent with cervical radiculopathy. He tried prednisone in the past which helped.  Pt does not want referral at this time for further evaluation .  He is agreeable to try gabapentin at night to see if helpful for nerve pain  Pt would like to get another cologuard sent - did not complete last one  Pt with GERD - stable on nexium  20mg  qd  Pt would like pneumonia shot today  Current Outpatient Medications on File Prior to Visit  Medication Sig Dispense Refill   aspirin EC 325 MG EC tablet Take 1 tablet (325 mg total) by mouth daily.     atorvastatin (LIPITOR) 80 MG tablet TAKE 1 TABLET(80 MG) BY MOUTH DAILY 90 tablet 1   docusate sodium (COLACE) 250 MG capsule Take 250 mg by mouth daily as needed for constipation.     esomeprazole (NEXIUM) 20 MG capsule Take 20 mg by mouth every evening.     ferrous fumarate-b12-vitamic C-folic acid (TRINSICON / FOLTRIN) capsule Take 1 capsule by mouth 2 (two) times daily after a meal. 60 capsule 2   losartan (COZAAR) 100 MG tablet Take 1 tablet (100 mg total) by mouth daily. 90 tablet 3   metFORMIN (GLUCOPHAGE) 1000 MG tablet TAKE 1 TABLET(1000 MG) BY MOUTH TWICE DAILY WITH A MEAL 60 tablet 2   metoprolol tartrate (LOPRESSOR) 25 MG tablet Take 0.5 tablets (12.5 mg total) by mouth 2 (two) times daily. 90 tablet 2   Omega-3 Fatty Acids (FISH OIL) 1000 MG CAPS Take 1,000 mg by mouth 2 (two) times daily.      amLODipine (NORVASC) 5 MG tablet Take 1 tablet (5 mg total) by mouth daily. 180 tablet 3   No current facility-administered medications on file prior to visit.   Past Medical History:  Diagnosis Date   Alcohol dependency (HCC) 06/24/2018  Alcoholism (HCC) 06/24/2018   Aortic stenosis    Aortic valve abscess    Ascending aortic aneurysm (HCC) 41 mm as measured by echo 07/02/2018   Atherosclerotic heart disease 06/24/2018   Bacteremia 09/26/2020   Bicuspid aortic valve 06/25/2018   Critical aortic valve stenosis mean gradient 64 mmHg, 06/25/2018   Dental caries    Diabetes mellitus type 2, controlled (HCC) 06/24/2018   Dyspnea    Endocarditis 09/26/2020   Essential hypertension 06/24/2018   Fatigue 09/15/2020   GERD (gastroesophageal reflux disease) 06/24/2018   Heart murmur 06/24/2018   Hyperlipidemia 06/24/2018   Night sweats 09/15/2020   Obesity, diabetes, and hypertension syndrome (HCC)  02/28/2020   Protein-calorie malnutrition, severe 09/28/2020   S/P aortic valve replacement and aortoplasty 10/04/2020   S/P AVR 09/22/2018   Biological Bentall Procedure using a 25 mm Edwards Magna-Ease pericardial valve and a 28 mm Gelweave Valsalva graft.   Secondary adhesive capsulitis of left shoulder 08/31/2020   Thoracic ascending aortic aneurysm [facility])    Past Surgical History:  Procedure Laterality Date   BENTALL PROCEDURE N/A 09/10/2018   Procedure: BENTALL PROCEDURE USING HEMASHIELD 28, GELWEAVE VALSALVA , AND MAGNA EASE ;  Surgeon: [name], MD;  Location: [facility] OR;  Service: Open Heart Surgery;  Laterality: N/A;   BENTALL PROCEDURE N/A 10/04/2020   Procedure: REDO BENTALL PROCEDURE ON PUMP WITH A HEMASHIELD PLATINUM GRAFT AND HOMOGRAFT;  Surgeon: [name], MD;  Location: [facility] OR;  Service: Open Heart Surgery;  Laterality: N/A;  AXILLARY CANNULATION WITH GRAFT   BUBBLE STUDY  09/28/2020   Procedure: BUBBLE STUDY;  Surgeon: Parke Poisson, MD;  Location: [facility] ENDOSCOPY;  Service: Cardiology;;   CHOLECYSTECTOMY     ENDOVEIN HARVEST OF GREATER SAPHENOUS VEIN Right 10/04/2020   Procedure: ENDOVEIN HARVEST OF GREATER SAPHENOUS VEIN;  Surgeon: [name], MD;  Location: [facility] OR;  Service: Open Heart Surgery;  Laterality: Right;   Heart valve replaced revision  2022   RIGHT HEART CATH AND CORONARY ANGIOGRAPHY N/A 07/07/2018   Procedure: RIGHT HEART CATH AND CORONARY ANGIOGRAPHY;  Surgeon: Lennette Bihari, MD;  Location: [facility] INVASIVE CV LAB;  Service: Cardiovascular;  Laterality: N/A;   TEE WITHOUT CARDIOVERSION N/A 09/10/2018   Procedure: TRANSESOPHAGEAL ECHOCARDIOGRAM (TEE);  Surgeon: [name], MD;  Location: [facility] OR;  Service: Open Heart Surgery;  Laterality: N/A;   TEE WITHOUT CARDIOVERSION N/A 09/28/2020   Procedure: TRANSESOPHAGEAL ECHOCARDIOGRAM (TEE);  Surgeon: Parke Poisson, MD;  Location: [facility] ENDOSCOPY;  Service: Cardiology;  Laterality: N/A;    TEE WITHOUT CARDIOVERSION N/A 10/04/2020   Procedure: TRANSESOPHAGEAL ECHOCARDIOGRAM (TEE);  Surgeon: [name], MD;  Location: [facility] OR;  Service: Open Heart Surgery;  Laterality: N/A;    Family History  Problem Relation Age of Onset   Lung cancer Mother    Pulmonary disease Mother    Alcoholism Father    Anxiety disorder Father    Hyperlipidemia Father    Lung cancer Father    CAD Father    Renal cancer Father    Pulmonary disease Father    Social History   Socioeconomic History   Marital status: Married    Spouse name: Not on file   Number of children: Not on file   Years of education: Not on file   Highest education level: Not on file  Occupational History   Not on file  Tobacco Use   Smoking status: Never   Smokeless tobacco: Former  Types: Snuff  Vaping Use   Vaping status: Never Used  Substance and Sexual Activity   Alcohol use: Not Currently    Alcohol/week: 2.0 standard drinks of alcohol    Types: 2 Cans of beer per week    Comment: daily   Drug use: Never   Sexual activity: Not Currently  Other Topics Concern   Not on file  Social History Narrative   Not on file   Social Drivers of Health   Financial Resource Strain: Low Risk  (01/23/2023)   Overall Financial Resource Strain (CARDIA)    Difficulty of Paying Living Expenses: Not hard at all  Food Insecurity: No Food Insecurity (01/23/2023)   Hunger Vital Sign    Worried About Running Out of Food in the Last Year: Never true    Ran Out of Food in the Last Year: Never true  Transportation Needs: No Transportation Needs (01/23/2023)   PRAPARE - Administrator, Civil Service (Medical): No    Lack of Transportation (Non-Medical): No  Physical Activity: Insufficiently Active (01/23/2023)   Exercise Vital Sign    Days of Exercise per Week: 5 days    Minutes of Exercise per Session: 20 min  Stress: No Stress Concern Present (01/23/2023)   [employer] of Occupational Health -  Occupational Stress Questionnaire    Feeling of Stress : Not at all  Social Connections: Moderately Isolated (01/23/2023)   Social Connection and Isolation Panel [NHANES]    Frequency of Communication with Friends and Family: More than three times a week    Frequency of Social Gatherings with Friends and Family: Three times a week    Attends Religious Services: Never    Active Member of Clubs or Organizations: No    Attends Banker Meetings: Never    Marital Status: Living with partner   CONSTITUTIONAL: Negative for chills, fatigue, fever, unintentional weight gain and unintentional weight loss.  E/N/T: Negative for ear pain, nasal congestion and sore throat.  CARDIOVASCULAR: Negative for chest pain, dizziness, palpitations and pedal edema.  RESPIRATORY: Negative for recent cough and dyspnea.  GASTROINTESTINAL: Negative for abdominal pain, acid reflux symptoms, constipation, diarrhea, nausea and vomiting.  MSK: see HPI INTEGUMENTARY: Negative for rash.  NEUROLOGICAL: see HPI PSYCHIATRIC: Negative for sleep disturbance and to question depression screen.  Negative for depression, negative for anhedonia.       Objective:  PHYSICAL EXAM:   VS: BP 134/70   Pulse 61   Temp 97.8 F (36.6 C) (Temporal)   Resp 18   Ht 5\' 8"  (1.727 m)   Wt 211 lb 9.6 oz (96 kg)   SpO2 97%   BMI 32.17 kg/m   GEN: Well nourished, well developed, in no acute distress   Cardiac: RRR; no murmurs, rubs, or gallops,no edema - no significant varicosities Respiratory:  normal respiratory rate and pattern with no distress - normal breath sounds with no rales, rhonchi, wheezes or rubs GI: normal bowel sounds, no masses or tenderness MS: no deformity or atrophy  Skin: warm and dry, no rash  Neuro:  Alert and Oriented x 3,- CN II-Xii grossly intact Psych: euthymic mood, appropriate affect and demeanor   Lab Results  Component Value Date   WBC 6.0 04/30/2023   HGB 12.3 (L) 04/30/2023   HCT  35.8 (L) 04/30/2023   PLT 210 04/30/2023   GLUCOSE 97 01/23/2023   CHOL 147 01/23/2023   TRIG 136 01/23/2023   HDL 51 01/23/2023   LDLCALC 72  01/23/2023   ALT 33 01/23/2023   AST 37 01/23/2023   NA 143 01/23/2023   K 4.6 01/23/2023   CL 101 01/23/2023   CREATININE 0.67 (L) 01/23/2023   BUN 7 01/23/2023   CO2 25 01/23/2023   TSH 2.220 01/23/2023   INR 1.4 (H) 10/04/2020   HGBA1C 6.4 (H) 01/23/2023      Assessment & Plan:   Problem List Items Addressed This Visit       Cardiovascular and Mediastinum   Essential hypertension - Primary- uncontrolled   Relevant Orders   Comprehensive metabolic panel   Hemoglobin A1c Continue current meds     Other   Hyperlipidemia   Relevant Orders   Lipid panel Continue meds Watch diet   Other Visit Diagnoses     Type 2 diabetes mellitus with hyperglycemia, without long-term current use of insulin (HCC)       Relevant Orders   Comprehensive metabolic panel   Hemoglobin A1c   Lipid panel Continue meds Urine microalbumin ordered     .GERD Continue nexium  Colon screening Cologuard ordered  Cervical radiculopathy Exercises/rom Rx for gabapentin  S/p aortic valve replacement and aortoplasty Continue follow up with cardiology  Need pneumonia vaccine Prevnar 20 given  Meds ordered this encounter  Medications   gabapentin (NEURONTIN) 300 MG capsule    Sig: Take 1 capsule (300 mg total) by mouth at bedtime.    Dispense:  30 capsule    Refill:  3    Supervising Provider:   Blane Ohara [phone]     Orders Placed This Encounter  Procedures   Pneumococcal conjugate vaccine 20-valent (Prevnar 20)   CBC with Differential/Platelet   Comprehensive metabolic panel with GFR   TSH   Lipid panel   Hemoglobin A1c   Iron, TIBC and Ferritin Panel   Cologuard   Microalbumin / creatinine urine ratio     Follow-up: Return in about 6 months (around 01/23/2024) for chronic fasting follow-up.  An After Visit Summary was  printed and given to the patient.  Jettie Pagan [facility] [phone]

## 2023-07-25 LAB — CBC WITH DIFFERENTIAL/PLATELET
Basophils Absolute: 0.1 10*3/uL (ref 0.0–0.2)
Basos: 1 %
EOS (ABSOLUTE): 0.3 10*3/uL (ref 0.0–0.4)
Eos: 5 %
Hematocrit: 37 % — ABNORMAL LOW (ref 37.5–51.0)
Hemoglobin: 12.8 g/dL — ABNORMAL LOW (ref 13.0–17.7)
Immature Grans (Abs): 0 10*3/uL (ref 0.0–0.1)
Immature Granulocytes: 0 %
Lymphocytes Absolute: 2.1 10*3/uL (ref 0.7–3.1)
Lymphs: 34 %
MCH: 32.5 pg (ref 26.6–33.0)
MCHC: 34.6 g/dL (ref 31.5–35.7)
MCV: 94 fL (ref 79–97)
Monocytes Absolute: 0.5 10*3/uL (ref 0.1–0.9)
Monocytes: 9 %
Neutrophils Absolute: 3.1 10*3/uL (ref 1.4–7.0)
Neutrophils: 51 %
Platelets: 174 10*3/uL (ref 150–450)
RBC: 3.94 x10E6/uL — ABNORMAL LOW (ref 4.14–5.80)
RDW: 11.4 % — ABNORMAL LOW (ref 11.6–15.4)
WBC: 6.1 10*3/uL (ref 3.4–10.8)

## 2023-07-25 LAB — COMPREHENSIVE METABOLIC PANEL WITH GFR
ALT: 28 IU/L (ref 0–44)
AST: 31 IU/L (ref 0–40)
Albumin: 4.7 g/dL (ref 3.8–4.9)
Alkaline Phosphatase: 62 IU/L (ref 44–121)
BUN/Creatinine Ratio: 11 (ref 9–20)
BUN: 10 mg/dL (ref 6–24)
Bilirubin Total: 0.9 mg/dL (ref 0.0–1.2)
CO2: 24 mmol/L (ref 20–29)
Calcium: 10.1 mg/dL (ref 8.7–10.2)
Chloride: 100 mmol/L (ref 96–106)
Creatinine, Ser: 0.89 mg/dL (ref 0.76–1.27)
Globulin, Total: 2.3 g/dL (ref 1.5–4.5)
Glucose: 117 mg/dL — ABNORMAL HIGH (ref 70–99)
Potassium: 4.7 mmol/L (ref 3.5–5.2)
Sodium: 139 mmol/L (ref 134–144)
Total Protein: 7 g/dL (ref 6.0–8.5)
eGFR: 99 mL/min/{1.73_m2} (ref >59)

## 2023-07-25 LAB — LIPID PANEL
Chol/HDL Ratio: 3.2 ratio (ref 0.0–5.0)
Cholesterol, Total: 136 mg/dL (ref 100–199)
HDL: 43 mg/dL (ref >39)
LDL Chol Calc (NIH): 66 mg/dL (ref 0–99)
Triglycerides: 159 mg/dL — ABNORMAL HIGH (ref 0–149)
VLDL Cholesterol Cal: 27 mg/dL (ref 5–40)

## 2023-07-25 LAB — IRON,TIBC AND FERRITIN PANEL
Ferritin: 108 ng/mL (ref 30–400)
Iron Saturation: 26 % (ref 15–55)
Iron: 83 ug/dL (ref 38–169)
Total Iron Binding Capacity: 315 ug/dL (ref 250–450)
UIBC: 232 ug/dL (ref 111–343)

## 2023-07-25 LAB — HEMOGLOBIN A1C
Est. average glucose Bld gHb Est-mCnc: 143 mg/dL
Hgb A1c MFr Bld: 6.6 % — ABNORMAL HIGH (ref 4.8–5.6)

## 2023-07-25 LAB — MICROALBUMIN / CREATININE URINE RATIO
Creatinine, Urine: 55.7 mg/dL
Microalb/Creat Ratio: 73 mg/g{creat} — ABNORMAL HIGH (ref 0–29)
Microalbumin, Urine: 40.5 ug/mL

## 2023-07-25 LAB — TSH: TSH: 2.19 u[IU]/mL (ref 0.450–4.500)

## 2023-07-27 ENCOUNTER — Other Ambulatory Visit: Payer: Self-pay | Admitting: Physician Assistant

## 2023-07-27 DIAGNOSIS — E1165 Type 2 diabetes mellitus with hyperglycemia: Secondary | ICD-10-CM

## 2023-09-18 ENCOUNTER — Other Ambulatory Visit: Payer: Self-pay

## 2023-09-18 MED ORDER — METOPROLOL TARTRATE 25 MG PO TABS: 12.5000 mg | ORAL_TABLET | Freq: Two times a day (BID) | ORAL | 0 refills | Status: DC

## 2023-10-20 ENCOUNTER — Other Ambulatory Visit: Payer: Self-pay | Admitting: Physician Assistant

## 2023-10-20 DIAGNOSIS — E1165 Type 2 diabetes mellitus with hyperglycemia: Secondary | ICD-10-CM

## 2023-11-17 ENCOUNTER — Other Ambulatory Visit: Payer: Self-pay

## 2023-11-19 ENCOUNTER — Other Ambulatory Visit: Payer: Self-pay | Admitting: Physician Assistant

## 2023-11-19 DIAGNOSIS — M79602 Pain in left arm: Secondary | ICD-10-CM

## 2023-12-04 NOTE — Progress Notes (Deleted)
 Cardiology Office Note   Date:  12/04/2023  ID:  Ronnie Ruiz, DOB [birth year], MRN [id number] PCP: Nicholaus Credit, PA-C  [facility] Providers Cardiologist:  Lamar Fitch, MD Cardiology APP:  Carlin Delon BROCKS, NP { Click to update primary MD,subspecialty MD or APP then REFRESH:1}    History of Present Illness Ronnie Ruiz is a 58 y.o. male with a past medical history of nonobstructive CAD, history of bicuspid aortic valve s/p AVR and Bentall procedure initially in 2020 >> redo in 2022, DM2, hypertension, dyslipidemia, history of EtOH abuse  01/24/2023 echo EF 60-65, mild concentric LVH, grade 1 DD, 25 mm valve present in the aortic position, moderate dilatation aortic root at 39 mm 04/27/2022 echo EF 60-65, mild MR, 25 mm Edwards valve present in aortic position, consistent with normal structure and function of the aortic valve prosthesis 10/04/2020 Redo Bentall procedure using a 23 mm aortic homograft valve/root 10/03/2020 carotid duplex mild bilateral carotid artery stenosis 09/09/2020 coronary CTA no changing of prior Bentall repair, coronary calcification noted but not appear to be severely stenotic 2020 Bentall procedure  He initially established care with Dr. Fitch in 2020 at the behest of his PCP for evaluation of aortic stenosis, he was symptomatic at this time, and he did not initially underwent Bentall procedure. He was admitted to the hospital in June 2022 for endocarditis, coronary CTA at that time revealed possible perivalvular abscess and he subsequently underwent a redo sternotomy replacing his ascending aortic graft using a 28 mm Hemashield platinum graft and redo Bentall procedure using a 23 mm aortic homograft valve/root.  Most recently he was evaluated by Dr. Fitch in August 2024, he was stable at that time although his blood pressure was elevated and he was started on amlodipine , repeat echo was arranged and completed revealing his prosthetic valve was  functioning appropriately.  Echo in October   SBE???  ROS: ROS   Studies Reviewed      Cardiac Studies & Procedures   ______________________________________________________________________________________________ CARDIAC CATHETERIZATION  CARDIAC CATHETERIZATION 07/07/2018  Conclusion  Prox LAD lesion is 5% stenosed.  There is trivial (1+) aortic regurgitation.  Mild coronary calcification without significant coronary obstructive disease with a normal left main, minimal luminal narrowing of 5% in the LAD; normal dominant left circumflex coronary artery, and normal nondominant RCA.  Mildly increased right heart pressures.  Dilated aortic root.  Severely calcified previously diagnosed bicuspid aortic valve with markedly reduced excursion.  There is trace to mild aortic insufficiency.  The aortic valve was not able to be crossed.  Echocardiographic gradient assessment revealed a mean gradient of 64, peak instantaneous gradient of 100 mmHg and a calculated valve area of 0.6 to 0.7 cm consistent with critical aortic stenosis.  Echocardiographic documentation of normal LV function with an EF of 60 to 65%.  RECOMMENDATION: The patient is followed by Dr. Krasowski.  Surgical consultation will be necessary.  In this 58 year old patient with bicuspid valve, probable SAVR rather than TAVR will be necessary.  Findings Coronary Findings Diagnostic  Dominance: Left  Left Anterior Descending Prox LAD lesion is 5% stenosed.  Intervention  No interventions have been documented.     ECHOCARDIOGRAM  ECHOCARDIOGRAM COMPLETE 01/24/2023  Narrative ECHOCARDIOGRAM REPORT    Patient Name:   Ronnie Ruiz Date of Exam: 01/24/2023 Medical Rec #:  [id number]      Height:       68.0 in Accession #:    [id number]     Weight:  213.6 lb Date of Birth:  30-Dec-1965      BSA:          2.102 m Patient Age:    57 years       BP:           130/60 mmHg Patient Gender: M              HR:            62 bpm. Exam Location:  [place]  Procedure: 2D Echo, Cardiac Doppler, Color Doppler and Strain Analysis  Indications:    Dyspnea on exertion [R06.09 (ICD-10-CM)]  History:        Patient has prior history of Echocardiogram examinations, most recent 05/18/2022. Aortic Valve Disease; Risk Factors:Hypertension and Dyslipidemia. Aortic Valve: 25 mm valve is present in the aortic position. Procedure Date: 09/22/2018 Bentall procedure homograft.  Sonographer:    Charlie Jointer RDCS Referring Phys: [id number] LAMAR JINNY FITCH   Sonographer Comments: Suboptimal apical window. IMPRESSIONS   1. Left ventricular ejection fraction, by estimation, is 60 to 65%. The left ventricle has normal function. The left ventricle has no regional wall motion abnormalities. There is mild concentric left ventricular hypertrophy. Left ventricular diastolic parameters are consistent with Grade I diastolic dysfunction (impaired relaxation). 2. Right ventricular systolic function is normal. The right ventricular size is normal. There is normal pulmonary artery systolic pressure. 3. The mitral valve is degenerative. No evidence of mitral valve regurgitation. No evidence of mitral stenosis. 4. Aortic valve regurgitation is not visualized. No aortic stenosis is present. There is a 25 mm valve present in the aortic position. Procedure Date: 09/22/2018 Bentall procedure homograft. Echo findings are consistent with normal structure and function of the aortic valve prosthesis. 5. There is moderate dilatation of the aortic root, measuring 39 mm. 6. The inferior vena cava is normal in size with greater than 50% respiratory variability, suggesting right atrial pressure of 3 mmHg.  FINDINGS Left Ventricle: Left ventricular ejection fraction, by estimation, is 60 to 65%. The left ventricle has normal function. The left ventricle has no regional wall motion abnormalities. The left ventricular internal cavity size was normal in  size. There is mild concentric left ventricular hypertrophy. Left ventricular diastolic parameters are consistent with Grade I diastolic dysfunction (impaired relaxation).  Right Ventricle: The right ventricular size is normal. No increase in right ventricular wall thickness. Right ventricular systolic function is normal. There is normal pulmonary artery systolic pressure. The tricuspid regurgitant velocity is 2.32 m/s, and with an assumed right atrial pressure of 3 mmHg, the estimated right ventricular systolic pressure is 24.5 mmHg.  Left Atrium: Left atrial size was normal in size.  Right Atrium: Right atrial size was normal in size.  Pericardium: There is no evidence of pericardial effusion.  Mitral Valve: The mitral valve is degenerative in appearance. Mild mitral annular calcification. No evidence of mitral valve regurgitation. No evidence of mitral valve stenosis.  Tricuspid Valve: The tricuspid valve is normal in structure. Tricuspid valve regurgitation is mild . No evidence of tricuspid stenosis.  Aortic Valve: Aortic valve regurgitation is not visualized. No aortic stenosis is present. Aortic valve mean gradient measures 5.0 mmHg. Aortic valve peak gradient measures 12.8 mmHg. Aortic valve area, by VTI measures 2.71 cm. There is a 25 mm valve present in the aortic position. Procedure Date: 09/22/2018 Bentall procedure homograft. Echo findings are consistent with normal structure and function of the aortic valve prosthesis.  Pulmonic Valve: The pulmonic valve was normal in structure.  Pulmonic valve regurgitation is mild. No evidence of pulmonic stenosis.  Aorta: The aortic arch was not well visualized. There is moderate dilatation of the aortic root, measuring 39 mm.  Venous: A normal flow pattern is recorded from the right upper pulmonary vein. The inferior vena cava is normal in size with greater than 50% respiratory variability, suggesting right atrial pressure of 3  mmHg.  IAS/Shunts: No atrial level shunt detected by color flow Doppler.   LEFT VENTRICLE PLAX 2D LVIDd:         5.70 cm   Diastology LVIDs:         3.70 cm   LV e' medial:    8.92 cm/s LV PW:         1.20 cm   LV E/e' medial:  10.6 LV IVS:        1.20 cm   LV e' lateral:   11.75 cm/s LVOT diam:     2.10 cm   LV E/e' lateral: 8.0 LV SV:         89 LV SV Index:   42 LVOT Area:     3.46 cm   RIGHT VENTRICLE             IVC RV Basal diam:  4.30 cm     IVC diam: 2.40 cm RV Mid diam:    3.90 cm RV S prime:     10.00 cm/s TAPSE (M-mode): 2.7 cm  LEFT ATRIUM             Index        RIGHT ATRIUM           Index LA diam:        5.10 cm 2.43 cm/m   RA Area:     18.10 cm LA Vol (A2C):   51.1 ml 24.31 ml/m  RA Volume:   51.70 ml  24.60 ml/m LA Vol (A4C):   62.1 ml 29.55 ml/m LA Biplane Vol: 57.6 ml 27.41 ml/m AORTIC VALVE                     PULMONIC VALVE AV Area (Vmax):    2.47 cm      PR End Diast Vel: 4.67 msec AV Area (Vmean):   2.64 cm AV Area (VTI):     2.71 cm AV Vmax:           179.00 cm/s AV Vmean:          102.020 cm/s AV VTI:            0.329 m AV Peak Grad:      12.8 mmHg AV Mean Grad:      5.0 mmHg LVOT Vmax:         127.50 cm/s LVOT Vmean:        77.900 cm/s LVOT VTI:          0.258 m LVOT/AV VTI ratio: 0.78  AORTA Ao Root diam: 3.90 cm Ao Asc diam:  2.80 cm Ao Desc diam: 2.40 cm  MITRAL VALVE               TRICUSPID VALVE MV Area (PHT): 4.58 cm    TR Peak grad:   21.5 mmHg MV Decel Time: 166 msec    TR Vmax:        232.00 cm/s MR Peak grad: 92.0 mmHg MR Vmax:      479.50 cm/s  SHUNTS MV E velocity: 94.20 cm/s  Systemic VTI:  0.26  m MV A velocity: 82.90 cm/s  Systemic Diam: 2.10 cm MV E/A ratio:  1.14  Redell Leiter MD Electronically signed by Redell Leiter MD Signature Date/Time: 01/24/2023/5:26:44 PM    Final   TEE  ECHO INTRAOPERATIVE TEE 10/04/2020  Narrative *INTRAOPERATIVE TRANSESOPHAGEAL REPORT *    Patient Name:   Ronnie Ruiz Date of Exam: 10/04/2020 Medical Rec #:  [id number]      Height:       68.0 in Accession #:    [id number]     Weight:       186.4 lb Date of Birth:  [birth year]      BSA:          1.98 m Patient Age:    55 years       BP:           98/66 mmHg Patient Gender: M              HR:           74 bpm. Exam Location:  Anesthesiology  Transesophogeal exam was perform intraoperatively during surgical procedure. Patient was closely monitored under general anesthesia during the entirety of examination.  Indications:     infection s/p ascending aortic graft Sonographer:     Lauraine Pilot RDCS Performing Phys: [id number] DORISE MARLA FELLERS Diagnosing Phys: Kelly Mace MD  Complications: No known complications during this procedure. POST-OP IMPRESSIONS Limited post-CPB exam: The patient separated easily from CPB - Left Ventricle: The left ventricular function is unchanged from pre-bypass images. Contractility is normal, with no regional wall motion abnormalities. EF 64%. - Right Ventricle: The right ventricular function was low normal on separation from CPB. This normalized with time off of CPB. Overall RV function appears unchanged from pre-bypass images.. - Aorta: A graft was placed in the ascending aorta for repair. The conduit suture lines appear intact. - Aortic Valve: A prosthetic valve is present in the Aortic position. The gradient recorded across the prosthetic valve is within the normal range, peak gradient 9 mmHg, mean gradient 5 mmHg. There is no stenosis, insuficiency or perivalvular leak noted. - Mitral Valve: The mitral valve function appears unchanged from pre-bypass images. The mild MR on separation from CPB resolved with time off of CPB. - Tricuspid Valve: The tricuspid valve function appears unchanged from pre-bypass images. Mild TR remains.  PRE-OP FINDINGS Left Ventricle: The left ventricle has normal systolic function, with an ejection fraction of 55-60%, measured 59%.  The cavity size was normal. There is no left ventricular hypertrophy. Left ventricular diastolic function was not evaluated.  Right Ventricle: The right ventricle has normal systolic function. The cavity was normal. There is no increase in right ventricular wall thickness. Catheter present in the right ventricle.  Left Atrium: Left atrial size was normal in size. No left atrial/left atrial appendage thrombus was detected. Left atrial appendage velocity is normal at greater than 40 cm/s.  Right Atrium: Right atrial size was normal in size. Catheter present in the right atrium.  Interatrial Septum: No atrial level shunt detected by color flow Doppler. Agitated Albumin  contrast bubble study was negative, with no evidence of any interatrial shunt. There is no evidence of a patent foramen ovale.  Pericardium: There is no evidence of pericardial effusion.  Mitral Valve: The mitral valve is normal in structure. Mitral valve regurgitation is trivial by color flow Doppler. There is no evidence of mitral valve vegetation. There is no evidence of mitral stenosis, with peak gradient 1 mmHg, mean  gradient 1 mmHg.  Tricuspid Valve: The tricuspid valve was normal in structure. Tricuspid valve regurgitation is mild by color flow Doppler. No evidence of tricuspid stenosis is present. There is no evidence of tricuspid valve vegetation.  Aortic Valve: Pericardial tissue (M[place]-Eaze) Aortic valve is present. Regurgitation is trivial, at the hinge points of the leaflets, by color flow Doppler. There is no stenosis of the prosthetic aortic valve, with mean gradient 9 mmHg, peak gradient 19 mmHg. There is no evidence of aortic valve vegetation. There is a large (at least 1.85 x 1.6 cm) fluid collection posterior to the graft, which is seen behind the left coronary cusp. It most likely surrounds the valve/graft, extending up to the level of the Sinus of Valsalva.  Pulmonic Valve: The pulmonic valve was normal in  structure, with normal leaflet mobility. No evidence of pulmonic stenosis. Pulmonic valve regurgitation is trivial, around the PA catheter, by color flow Doppler.   Aorta: The descending aorta and aortic arch are normal in size and structure. There is a prosthetic graft/conduit seen in the ascending aorta. There is a fluid collect posterior to the left coronary cusp and Sinus of Valsalva, suspicious for abscess. The graft appears intact, as does the prosthetic aortic valve. There is no flow into the abscess, so no fistula evident.  Pulmonary Artery: The pulmonary artery is of normal size.  Venous: The inferior vena cava is normal in size with greater than 50% respiratory variability, suggesting right atrial pressure of 3 mmHg.  +--------------+-------++ LEFT VENTRICLE        +--------------+-------++ PLAX 2D               +--------------+-------++ LVIDd:        4.07 cm +--------------+-------++ LVIDs:        2.67 cm +--------------+-------++ LV PW:        0.50 cm +--------------+-------++ LV SV:        47 ml   +--------------+-------++ LV SV Index:  22.92   +--------------+-------++                       +--------------+-------++  +-------------+------------++ AORTIC VALVE              +-------------+------------++ AV Vmax:     183.00 cm/s  +-------------+------------++ AV Vmean:    121.500 cm/s +-------------+------------++ AV VTI:      0.362 m      +-------------+------------++ AV Peak Grad:13.4 mmHg    +-------------+------------++ AV Mean Grad:7.0 mmHg     +-------------+------------++  +-------------+---------++ MITRAL VALVE           +-------------+---------++ MV Peak grad:1.2 mmHg  +-------------+---------++ MV Mean grad:1.0 mmHg  +-------------+---------++ MV Vmax:     0.55 m/s  +-------------+---------++ MV Vmean:    38.2 cm/s +-------------+---------++ MV VTI:      0.16 m     +-------------+---------++   Kelly Mace MD Electronically signed by Kelly Mace MD Signature Date/Time: 10/05/2020/4:38:24 PM    Final    CT SCANS  CT CORONARY MORPH W/CTA COR W/SCORE 09/29/2020  Addendum 09/29/2020  6:45 PM ADDENDUM REPORT: 09/29/2020 18:43  CLINICAL DATA:  Bacteremia, concern for perivalvular abscess.  59 yo male with bacteremia and fevers, with history of bicuspid aortic valve stenosis and ascending aortic aneurysm, s/p Biological Bentall Procedure using a 25 mm Edwards Magna-Ease pericardial valve and a 28 mm Gelweave Valsalva graft, as well as replacement of the ascending aorta (hemi-arch) using a 28 mm Hemashield graft on 09/10/2018 with Dr.  Bartle.  COMPARISON INTRAOPERATIVE TEE 09/10/2018, CTA CHEST AORTA 10/20/19, TRANSTHORACIC ECHO 04/03/20.  EXAM: Cardiac ECG gated CT angiogram  TECHNIQUE: The patient was scanned on a Siemens Force 192 slice scanner. A 120 kV retrospective scan was triggered in the descending thoracic aorta at 111 HU's. Gantry rotation speed was 270 msecs and collimation was .9 mm. The 3D data set was reconstructed in 5% intervals of the R-R cycle. Systolic and diastolic phases were analyzed on a dedicated work station using MPR, MIP and VRT modes. The patient received OMNIPAQUE  IOHEXOL  350 MG/ML SOLN of contrast.  Nitroglycerin  could not be administered due to hypotension.  FINDINGS: AORTIC VALVE:  There is a bioprosthesis in the aortic position, as part of a valved conduit.  Posterior to the valve, there is a 3 x 3 x 3 cm area of mixed attenuation, with low density centrally and relative peripheral enhancement, consistent with perivalvular abscess. This finding was not present on CTA chest performed 10/20/19, and is not seen on immediately post-operative TEE. There is no contrast opacification of this space, and no evidence of peri-graft pseudoaneurysm or fistula. The prosthetic valve leaflets are  normal with normal excursion.  AORTA:  The sinus of Valsalva and ascending aorta have been replaced. There is thickening and fluid density consistent with extension of above noted periprosthetic abscess. The ascending aorta graft otherwise appears stable. The ascending aorta distal to the graft is normal caliber, 33 mm at distal ascending aorta just before the common origin of common carotid arteries.  CORONARY ARTERIES: Study was not performed to evaluate coronary arteries. Nitroglycerin  was not given due to hypotension.  The left main coronary artery is encircled by mixed attenuation mass consistent with abscess. No communication with coronary artery seen. LM is free of disease and is not compressed by abscess.  Left dominant circulation. Calcifications in LAD and L circumflex. Proximal LAD and proximal L circumflex do not appear to have severe stenosis. RCA is small and appears patent.  OTHER:  Mild dilation of main pulmonary artery, 30 mm.  No left atrial appendage thrombus.  Normal pulmonary vein drainage into the left atrium.  IMPRESSION: 1. Findings consistent with perivalvular abscess posterior to the aortic valve bioprosthesis. The left main coronary artery is patent and traverses this area of mixed attenuation without compression of the left main coronary artery.  Findings communicated to the cardiology consult service. Recommend cardiac surgery consult.   Electronically Signed By: Soyla Merck On: 09/29/2020 18:43  Narrative EXAM: OVER-READ INTERPRETATION  CT CHEST  The following report is an over-read performed by radiologist Dr. JONETTA Faes of [facility] on 09/29/2020. This over-read does not include interpretation of cardiac or coronary anatomy or pathology. The coronary calcium  score/coronary CTA interpretation by the cardiologist is dictated separately.  COMPARISON:  10/20/2019  FINDINGS: Vascular: Heart size normal. Fair  contrast opacification of pulmonary artery branches; the exam was not optimized for detection of pulmonary emboli. Coronary calcifications. Previous AVR. Stable ascending aortic tube graft repair. Good contrast opacification of the thoracic aorta without dissection, aneurysm, or stenosis. Common origin of common carotid arteries with some eccentric partially calcified plaque, no high-grade stenosis. Left subclavian artery widely patent. Aberrant origin of the right subclavian artery which takes a retroesophageal course.  Mediastinum/Nodes: No mass or adenopathy.  Lungs/Pleura: No pleural effusion. No pneumothorax. Lungs are clear.  Upper Abdomen: No acute findings.  Musculoskeletal: Sternotomy wires. Anterior vertebral endplate spurring at multiple levels in the lower thoracic spine. No fracture or  worrisome bone lesion.  IMPRESSION: 1. No acute findings. 2. Stable changes of Bentall repair. 3. Coronary and Aortic Atherosclerosis (ICD10-170.0). 4. Aberrant origin of right subclavian artery, an anatomic variant.  Electronically Signed: By: JONETTA Faes M.D. On: 09/29/2020 15:14     ______________________________________________________________________________________________      Risk Assessment/Calculations {Does this patient have ATRIAL FIBRILLATION?:[phone]} No BP recorded.  {Refresh Note OR Click here to enter BP  :1}***       Physical Exam VS:  There were no vitals taken for this visit.       Wt Readings from Last 3 Encounters:  07/24/23 211 lb 9.6 oz (96 kg)  01/23/23 213 lb 9.6 oz (96.9 kg)  01/01/23 209 lb 9.6 oz (95.1 kg)    GEN: Well nourished, well developed in no acute distress NECK: No JVD; No carotid bruits CARDIAC: ***RRR, no murmurs, rubs, gallops RESPIRATORY:  Clear to auscultation without rales, wheezing or rhonchi  ABDOMEN: Soft, non-tender, non-distended EXTREMITIES:  No edema; No deformity   ASSESSMENT AND PLAN S/p AVR and aortic root repair  -     {Are you ordering a CV Procedure (e.g. stress test, cath, DCCV, TEE, etc)?   Press F2        :[id number]}  Dispo: ***  Signed, Delon JAYSON Hoover, NP

## 2023-12-05 ENCOUNTER — Ambulatory Visit: Admitting: Cardiology

## 2023-12-08 ENCOUNTER — Encounter: Payer: Self-pay | Admitting: Cardiology

## 2023-12-08 ENCOUNTER — Ambulatory Visit: Attending: Cardiology | Admitting: Cardiology

## 2023-12-08 ENCOUNTER — Other Ambulatory Visit: Payer: Self-pay | Admitting: Cardiology

## 2023-12-08 VITALS — BP 134/68 | HR 65 | Ht 68.0 in | Wt 211.0 lb

## 2023-12-08 DIAGNOSIS — I10 Essential (primary) hypertension: Secondary | ICD-10-CM | POA: Diagnosis not present

## 2023-12-08 DIAGNOSIS — E1159 Type 2 diabetes mellitus with other circulatory complications: Secondary | ICD-10-CM

## 2023-12-08 DIAGNOSIS — I152 Hypertension secondary to endocrine disorders: Secondary | ICD-10-CM

## 2023-12-08 DIAGNOSIS — R0609 Other forms of dyspnea: Secondary | ICD-10-CM | POA: Diagnosis not present

## 2023-12-08 DIAGNOSIS — Z952 Presence of prosthetic heart valve: Secondary | ICD-10-CM

## 2023-12-08 DIAGNOSIS — E669 Obesity, unspecified: Secondary | ICD-10-CM

## 2023-12-08 DIAGNOSIS — E1169 Type 2 diabetes mellitus with other specified complication: Secondary | ICD-10-CM | POA: Diagnosis not present

## 2023-12-08 NOTE — Patient Instructions (Addendum)
 Medication Instructions:  Your physician recommends that you continue on your current medications as directed. Please refer to the Current Medication list given to you today.  *If you need a refill on your cardiac medications before your next appointment, please call your pharmacy*   Lab Work: None Ordered If you have labs (blood work) drawn today and your tests are completely normal, you will receive your results only by: MyChart Message (if you have MyChart) OR A paper copy in the mail If you have any lab test that is abnormal or we need to change your treatment, we will call you to review the results.   Testing/Procedures: Non-Cardiac CT Angiography (CTA), is a special type of CT scan that uses a computer to produce multi-dimensional views of major blood vessels throughout the body. In CT angiography, a contrast material is injected through an IV to help visualize the blood vessels   [facility] [address] [place], [place] [address] [phone]   Follow-Up: At [facility], you and your health needs are our priority.  As part of our continuing mission to provide you with exceptional heart care, we have created designated Provider Care Teams.  These Care Teams include your primary Cardiologist (physician) and Advanced Practice Providers (APPs -  Physician Assistants and Nurse Practitioners) who all work together to provide you with the care you need, when you need it.  We recommend signing up for the patient portal called MyChart.  Sign up information is provided on this After Visit Summary.  MyChart is used to connect with patients for Virtual Visits (Telemedicine).  Patients are able to view lab/test results, encounter notes, upcoming appointments, etc.  Non-urgent messages can be sent to your provider as well.   To learn more about what you can do with MyChart, go to [email or web address].    Your next appointment:   6 month(s)  The format for your next appointment:    In Person  Provider:   Lamar Fitch, MD    Other Instructions NA

## 2023-12-08 NOTE — Progress Notes (Unsigned)
 Cardiology Office Note:    Date:  12/08/2023   ID:  Ronnie Ruiz, DOB 18-Jan-1966, MRN [id number]  PCP:  Nicholaus Credit, PA-C  Cardiologist:  Lamar Fitch, MD    Referring MD: Nicholaus Credit, PA-C   Chief Complaint  Patient presents with   Follow-up    History of Present Illness:    Ronnie Ruiz is a 58 y.o. male past medical history significant for bicuspid arctic valve as well as ascending aortic aneurysm, dental procedure done in 2020 additional problem, type 2 diabetes, essential hypertension, dyslipidemia, history of EtOH abuse, gastroesophageal reflux disease, last May he started having some problems eventually ended up having implant positive culture for Aggregatibacter, he was identified to have abscess in his aorta with perivascular abscess he required redo sternotomy with replacing of the ascending aortic graft using 28 mm Hemi shield platinum graft redo Bentall procedure with 23 mm aortic homograft.  He did quite well after surgery and comes today to months for follow-up.  Denies have any chest pain tightness squeezing pressure burning chest the only problem he complained of is some tingling in his fingers and its most like related to his neck.  Diabetes seems to be decently controlled  Past Medical History:  Diagnosis Date   Alcohol dependency (HCC) 06/24/2018   Alcoholism (HCC) 06/24/2018   Aortic stenosis    Aortic valve abscess    Ascending aortic aneurysm (HCC) 41 mm as measured by echo 07/02/2018   Atherosclerotic heart disease 06/24/2018   Bacteremia 09/26/2020   Bicuspid aortic valve 06/25/2018   Critical aortic valve stenosis mean gradient 64 mmHg, 06/25/2018   Dental caries    Diabetes mellitus type 2, controlled (HCC) 06/24/2018   Dyspnea    Endocarditis 09/26/2020   Essential hypertension 06/24/2018   Fatigue 09/15/2020   GERD (gastroesophageal reflux disease) 06/24/2018   Heart murmur 06/24/2018   Hyperlipidemia 06/24/2018   Night sweats 09/15/2020   Obesity, diabetes, and  hypertension syndrome (HCC) 02/28/2020   Protein-calorie malnutrition, severe 09/28/2020   S/P aortic valve replacement and aortoplasty 10/04/2020   S/P AVR 09/22/2018   Biological Bentall Procedure using a 25 mm Edwards Magna-Ease pericardial valve and a 28 mm Gelweave Valsalva graft.   Secondary adhesive capsulitis of left shoulder 08/31/2020   Thoracic ascending aortic aneurysm [facility])     Past Surgical History:  Procedure Laterality Date   BENTALL PROCEDURE N/A 09/10/2018   Procedure: BENTALL PROCEDURE USING HEMASHIELD 28, GELWEAVE VALSALVA , AND MAGNA EASE ;  Surgeon: Lucas Dorise POUR, MD;  Location: [facility] OR;  Service: Open Heart Surgery;  Laterality: N/A;   BENTALL PROCEDURE N/A 10/04/2020   Procedure: REDO BENTALL PROCEDURE ON PUMP WITH A HEMASHIELD PLATINUM GRAFT AND HOMOGRAFT;  Surgeon: Lucas Dorise POUR, MD;  Location: [facility] OR;  Service: Open Heart Surgery;  Laterality: N/A;  AXILLARY CANNULATION WITH GRAFT   BUBBLE STUDY  09/28/2020   Procedure: BUBBLE STUDY;  Surgeon: Loni Soyla LABOR, MD;  Location: [facility] ENDOSCOPY;  Service: Cardiology;;   CHOLECYSTECTOMY     ENDOVEIN HARVEST OF GREATER SAPHENOUS VEIN Right 10/04/2020   Procedure: ENDOVEIN HARVEST OF GREATER SAPHENOUS VEIN;  Surgeon: Lucas Dorise POUR, MD;  Location: [facility] OR;  Service: Open Heart Surgery;  Laterality: Right;   Heart valve replaced revision  2022   RIGHT HEART CATH AND CORONARY ANGIOGRAPHY N/A 07/07/2018   Procedure: RIGHT HEART CATH AND CORONARY ANGIOGRAPHY;  Surgeon: Burnard Debby LABOR, MD;  Location: [facility] INVASIVE CV LAB;  Service:  Cardiovascular;  Laterality: N/A;   TEE WITHOUT CARDIOVERSION N/A 09/10/2018   Procedure: TRANSESOPHAGEAL ECHOCARDIOGRAM (TEE);  Surgeon: Lucas Dorise POUR, MD;  Location: [facility] OR;  Service: Open Heart Surgery;  Laterality: N/A;   TEE WITHOUT CARDIOVERSION N/A 09/28/2020   Procedure: TRANSESOPHAGEAL ECHOCARDIOGRAM (TEE);  Surgeon: Loni Soyla LABOR, MD;  Location: [facility] ENDOSCOPY;  Service:  Cardiology;  Laterality: N/A;   TEE WITHOUT CARDIOVERSION N/A 10/04/2020   Procedure: TRANSESOPHAGEAL ECHOCARDIOGRAM (TEE);  Surgeon: Lucas Dorise POUR, MD;  Location: [facility] OR;  Service: Open Heart Surgery;  Laterality: N/A;    Current Medications: Current Meds  Medication Sig   amLODipine  (NORVASC ) 5 MG tablet Take 1 tablet (5 mg total) by mouth daily.   aspirin  EC 325 MG EC tablet Take 1 tablet (325 mg total) by mouth daily.   atorvastatin  (LIPITOR ) 80 MG tablet TAKE 1 TABLET(80 MG) BY MOUTH DAILY   docusate sodium  (COLACE) 250 MG capsule Take 250 mg by mouth daily as needed for constipation.   esomeprazole (NEXIUM) 20 MG capsule Take 20 mg by mouth every evening.   ferrous fumarate-b12-vitamic C-folic acid (TRINSICON / FOLTRIN) capsule Take 1 capsule by mouth 2 (two) times daily after a meal.   gabapentin  (NEURONTIN ) 300 MG capsule TAKE 1 CAPSULE(300 MG) BY MOUTH AT BEDTIME   losartan  (COZAAR ) 100 MG tablet TAKE 1 TABLET(100 MG) BY MOUTH DAILY   metFORMIN  (GLUCOPHAGE ) 1000 MG tablet TAKE 1 TABLET(1000 MG) BY MOUTH TWICE DAILY WITH A MEAL   metoprolol  tartrate (LOPRESSOR ) 25 MG tablet Take 0.5 tablets (12.5 mg total) by mouth 2 (two) times daily.   Omega-3 Fatty Acids (FISH OIL) 1000 MG CAPS Take 1,000 mg by mouth 2 (two) times daily.      Allergies:   Patient has no known allergies.   Social History   Socioeconomic History   Marital status: Married    Spouse name: Not on file   Number of children: Not on file   Years of education: Not on file   Highest education level: Not on file  Occupational History   Not on file  Tobacco Use   Smoking status: Never   Smokeless tobacco: Former    Types: Snuff  Vaping Use   Vaping status: Never Used  Substance and Sexual Activity   Alcohol use: Not Currently    Alcohol/week: 2.0 standard drinks of alcohol    Types: 2 Cans of beer per week    Comment: daily   Drug use: Never   Sexual activity: Not Currently  Other Topics Concern   Not  on file  Social History Narrative   Not on file   Social Drivers of Health   Financial Resource Strain: Low Risk  (01/23/2023)   Overall Financial Resource Strain (CARDIA)    Difficulty of Paying Living Expenses: Not hard at all  Food Insecurity: No Food Insecurity (01/23/2023)   Hunger Vital Sign    Worried About Running Out of Food in the Last Year: Never true    Ran Out of Food in the Last Year: Never true  Transportation Needs: No Transportation Needs (01/23/2023)   PRAPARE - Administrator, Civil Service (Medical): No    Lack of Transportation (Non-Medical): No  Physical Activity: Insufficiently Active (01/23/2023)   Exercise Vital Sign    Days of Exercise per Week: 5 days    Minutes of Exercise per Session: 20 min  Stress: No Stress Concern Present (01/23/2023)   [employer] of Occupational Health - Occupational  Stress Questionnaire    Feeling of Stress : Not at all  Social Connections: Moderately Isolated (01/23/2023)   Social Connection and Isolation Panel    Frequency of Communication with Friends and Family: More than three times a week    Frequency of Social Gatherings with Friends and Family: Three times a week    Attends Religious Services: Never    Active Member of Clubs or Organizations: No    Attends Banker Meetings: Never    Marital Status: Living with partner     Family History: The patient's family history includes Alcoholism in his father; Anxiety disorder in his father; CAD in his father; Hyperlipidemia in his father; Lung cancer in his father and mother; Pulmonary disease in his father and mother; Renal cancer in his father. ROS:   Please see the history of present illness.    All 14 point review of systems negative except as described per history of present illness  EKGs/Labs/Other Studies Reviewed:         Recent Labs: 07/24/2023: ALT 28; BUN 10; Creatinine, Ser 0.89; Hemoglobin 12.8; Platelets 174; Potassium 4.7; Sodium  139; TSH 2.190  Recent Lipid Panel    Component Value Date/Time   CHOL 136 07/24/2023 0845   TRIG 159 (H) 07/24/2023 0845   HDL 43 07/24/2023 0845   CHOLHDL 3.2 07/24/2023 0845   LDLCALC 66 07/24/2023 0845    Physical Exam:    VS:  BP 134/68   Pulse 65   Ht 5' 8 (1.727 m)   Wt 211 lb (95.7 kg)   SpO2 95%   BMI 32.08 kg/m     Wt Readings from Last 3 Encounters:  12/08/23 211 lb (95.7 kg)  07/24/23 211 lb 9.6 oz (96 kg)  01/23/23 213 lb 9.6 oz (96.9 kg)     GEN:  Well nourished, well developed in no acute distress HEENT: Normal NECK: No JVD; No carotid bruits LYMPHATICS: No lymphadenopathy CARDIAC: RRR, systolic ejection murmur grade 1/6 pressure right upper portion of sternal, no rubs, no gallops RESPIRATORY:  Clear to auscultation without rales, wheezing or rhonchi  ABDOMEN: Soft, non-tender, non-distended MUSCULOSKELETAL:  No edema; No deformity  SKIN: Warm and dry LOWER EXTREMITIES: no swelling NEUROLOGIC:  Alert and oriented x 3 PSYCHIATRIC:  Normal affect   ASSESSMENT:    1. Essential hypertension   2. S/P aortic valve replacement and aortoplasty   3. S/P AVR   4. Obesity, diabetes, and hypertension syndrome (HCC)    PLAN:    In order of problems listed above:  Status post aortic valve replacement with Bentall procedure x 2.  Plan to recheck for stability of the repair.  I will schedule him to have CT of the chest with contrast. Part of evaluation echocardiogram repeated.  Likely clinically he is doing quite well. Dyslipidemia he is taking Lipitor  80 which we will continue, I did review K PN which show me data from July 24, 2023 with LDL 66 HDL 43 good results. Diabetes mellitus followed by antimedicine team KPN from April showing me hemoglobin A1c of 6.6 we will continue present management.   Medication Adjustments/Labs and Tests Ordered: Current medicines are reviewed at length with the patient today.  Concerns regarding medicines are outlined above.   Orders Placed This Encounter  Procedures   EKG 12-Lead   Medication changes: No orders of the defined types were placed in this encounter.   Signed, Lamar DOROTHA Fitch, MD, [facility] 12/08/2023 4:38 PM    [facility]

## 2023-12-16 ENCOUNTER — Other Ambulatory Visit: Payer: Self-pay | Admitting: Cardiology

## 2023-12-23 ENCOUNTER — Other Ambulatory Visit: Payer: Self-pay | Admitting: Cardiology

## 2023-12-26 ENCOUNTER — Ambulatory Visit (HOSPITAL_BASED_OUTPATIENT_CLINIC_OR_DEPARTMENT_OTHER)
Admission: RE | Admit: 2023-12-26 | Discharge: 2023-12-26 | Disposition: A | Discharge status: Home / Self Care | Source: Ambulatory Visit | Attending: Cardiology | Admitting: Cardiology

## 2023-12-26 ENCOUNTER — Encounter (HOSPITAL_BASED_OUTPATIENT_CLINIC_OR_DEPARTMENT_OTHER): Payer: Self-pay | Admitting: *Deleted

## 2023-12-26 DIAGNOSIS — Z952 Presence of prosthetic heart valve: Secondary | ICD-10-CM | POA: Diagnosis not present

## 2023-12-26 DIAGNOSIS — I517 Cardiomegaly: Secondary | ICD-10-CM | POA: Diagnosis not present

## 2023-12-26 DIAGNOSIS — I700 Atherosclerosis of aorta: Secondary | ICD-10-CM | POA: Diagnosis not present

## 2023-12-26 LAB — I-STAT CREATININE (MANUAL ENTRY): Creatinine, Ser: 0.9 (ref 0.50–1.10)

## 2023-12-26 MED ORDER — IOHEXOL 350 MG/ML SOLN
100.0000 mL | Freq: Once | INTRAVENOUS | Status: AC | PRN
  Administered 2023-12-26: 100 mL via INTRAVENOUS

## 2023-12-31 ENCOUNTER — Ambulatory Visit: Attending: Cardiology

## 2023-12-31 DIAGNOSIS — R0609 Other forms of dyspnea: Secondary | ICD-10-CM

## 2024-01-01 LAB — ECHOCARDIOGRAM COMPLETE
AR max vel: 2.5 cm2
AV Area VTI: 2.75 cm2
AV Area mean vel: 2.47 cm2
AV Mean grad: 4 mmHg
AV Peak grad: 7 mmHg
Ao pk vel: 1.33 m/s
Area-P 1/2: 3.39 cm2
MV M vel: 3.92 m/s
MV Peak grad: 61.5 mmHg
MV VTI: 1.33 cm2
S' Lateral: 2.8 cm

## 2024-01-02 ENCOUNTER — Ambulatory Visit: Payer: Self-pay | Admitting: Cardiology

## 2024-01-07 NOTE — Telephone Encounter (Signed)
-----   Message from Lamar Fitch sent at 01/02/2024  4:53 PM EDT ----- Echocardiogram showed normal left ventricular ejection fraction, mild mitral valve regurgitation, stable post Bentall repair ----- Message ----- From: Interface, [employer] One [employer] Sent: 01/01/2024  11:30 AM EDT To: Lamar JINNY Fitch, MD

## 2024-01-08 NOTE — Telephone Encounter (Signed)
 Wife Sherrye) returned RN's call.

## 2024-01-19 ENCOUNTER — Other Ambulatory Visit: Payer: Self-pay | Admitting: Physician Assistant

## 2024-01-19 DIAGNOSIS — E1165 Type 2 diabetes mellitus with hyperglycemia: Secondary | ICD-10-CM

## 2024-01-23 ENCOUNTER — Encounter: Payer: Self-pay | Admitting: Physician Assistant

## 2024-01-23 ENCOUNTER — Ambulatory Visit: Admitting: Physician Assistant

## 2024-01-23 VITALS — BP 132/62 | HR 60 | Temp 97.8°F | Resp 18 | Ht 68.0 in | Wt 209.4 lb

## 2024-01-23 DIAGNOSIS — Z952 Presence of prosthetic heart valve: Secondary | ICD-10-CM

## 2024-01-23 DIAGNOSIS — E1165 Type 2 diabetes mellitus with hyperglycemia: Secondary | ICD-10-CM

## 2024-01-23 DIAGNOSIS — Z7984 Long term (current) use of oral hypoglycemic drugs: Secondary | ICD-10-CM

## 2024-01-23 DIAGNOSIS — K219 Gastro-esophageal reflux disease without esophagitis: Secondary | ICD-10-CM

## 2024-01-23 DIAGNOSIS — Z23 Encounter for immunization: Secondary | ICD-10-CM

## 2024-01-23 DIAGNOSIS — Z125 Encounter for screening for malignant neoplasm of prostate: Secondary | ICD-10-CM

## 2024-01-23 DIAGNOSIS — E7800 Pure hypercholesterolemia, unspecified: Secondary | ICD-10-CM

## 2024-01-23 DIAGNOSIS — I10 Essential (primary) hypertension: Secondary | ICD-10-CM

## 2024-01-23 DIAGNOSIS — I2510 Atherosclerotic heart disease of native coronary artery without angina pectoris: Secondary | ICD-10-CM | POA: Diagnosis not present

## 2024-01-23 NOTE — Progress Notes (Signed)
 Subjective:  Patient ID: Ronnie Ruiz, male    DOB: 11/18/1965  Age: 58 y.o. MRN: [id number]  Chief Complaint  Patient presents with   Medical Management of Chronic Issues    HPI  Pt presents for follow up of hypertension. The patient is tolerating the medication well without side effects. Compliance with treatment has been good; including taking medication as directed , maintains a healthy diet and regular exercise regimen , and following up as directed. Pt currently taking metoprolol  25mg  bid , norvasc  5mg  and losartan  100 mg qd -   Mixed hyperlipidemia  Pt presents with hyperlipidemia.  Compliance with treatment has been good - The patient is compliant with medications, maintains a low cholesterol diet , follows up as directed , and maintains an exercise regimen . The patient denies experiencing any hypercholesterolemia related symptoms. Currently on lipitor  80mg  qd and fish oil  Pt with history of diabetes - glucose has been stable and last hgb a1c was good - he is currently on glucophage  1000mg  - pt states he has not been checking his glucose at home  Pt with history of aortic valve replacement and aortoplasty and currently follows with cardiology - all was stable at last visit including echocardiogram and coronary CT that was done last month He follows with cardiology every 6 months Denies chest pain/dyspnea  Pt with cervical radiculopathy and was referred to ortho but never heard about appt - he defers to schedule at this time.  Is taking gabapentin  which has helped his pain  Pt with GERD - stable on nexium 20mg  qd  Pt would like flu  shot today  Current Outpatient Medications on File Prior to Visit  Medication Sig Dispense Refill   amLODipine  (NORVASC ) 5 MG tablet TAKE 1 TABLET BY MOUTH DAILY 180 tablet 3   aspirin  EC 325 MG EC tablet Take 1 tablet (325 mg total) by mouth daily.     atorvastatin  (LIPITOR ) 80 MG tablet TAKE 1 TABLET(80 MG) BY MOUTH DAILY 90 tablet 3    docusate sodium  (COLACE) 250 MG capsule Take 250 mg by mouth daily as needed for constipation.     esomeprazole (NEXIUM) 20 MG capsule Take 20 mg by mouth every evening.     gabapentin  (NEURONTIN ) 300 MG capsule TAKE 1 CAPSULE(300 MG) BY MOUTH AT BEDTIME 30 capsule 3   losartan  (COZAAR ) 100 MG tablet TAKE 1 TABLET(100 MG) BY MOUTH DAILY 90 tablet 0   metFORMIN  (GLUCOPHAGE ) 1000 MG tablet TAKE 1 TABLET(1000 MG) BY MOUTH TWICE DAILY WITH A MEAL 60 tablet 2   metoprolol  tartrate (LOPRESSOR ) 25 MG tablet Take 0.5 tablets (12.5 mg total) by mouth 2 (two) times daily. 90 tablet 3   Omega-3 Fatty Acids (FISH OIL) 1000 MG CAPS Take 1,000 mg by mouth 2 (two) times daily.      No current facility-administered medications on file prior to visit.   Past Medical History:  Diagnosis Date   Alcohol dependency (HCC) 06/24/2018   Alcoholism (HCC) 06/24/2018   Aortic stenosis    Aortic valve abscess    Ascending aortic aneurysm (HCC) 41 mm as measured by echo 07/02/2018   Atherosclerotic heart disease 06/24/2018   Bacteremia 09/26/2020   Bicuspid aortic valve 06/25/2018   Critical aortic valve stenosis mean gradient 64 mmHg, 06/25/2018   Dental caries    Diabetes mellitus type 2, controlled (HCC) 06/24/2018   Dyspnea    Endocarditis 09/26/2020   Essential hypertension 06/24/2018   Fatigue 09/15/2020   GERD (  gastroesophageal reflux disease) 06/24/2018   Heart murmur 06/24/2018   Hyperlipidemia 06/24/2018   Night sweats 09/15/2020   Obesity, diabetes, and hypertension syndrome (HCC) 02/28/2020   Protein-calorie malnutrition, severe 09/28/2020   S/P aortic valve replacement and aortoplasty 10/04/2020   S/P AVR 09/22/2018   Biological Bentall Procedure using a 25 mm Edwards Magna-Ease pericardial valve and a 28 mm Gelweave Valsalva graft.   Secondary adhesive capsulitis of left shoulder 08/31/2020   Thoracic ascending aortic aneurysm    Past Surgical History:  Procedure Laterality Date   BENTALL PROCEDURE N/A 09/10/2018    Procedure: BENTALL PROCEDURE USING HEMASHIELD 28, GELWEAVE VALSALVA , AND MAGNA EASE ;  Surgeon: Lucas Dorise POUR, MD;  Location: [facility] OR;  Service: Open Heart Surgery;  Laterality: N/A;   BENTALL PROCEDURE N/A 10/04/2020   Procedure: REDO BENTALL PROCEDURE ON PUMP WITH A HEMASHIELD PLATINUM GRAFT AND HOMOGRAFT;  Surgeon: Lucas Dorise POUR, MD;  Location: [facility] OR;  Service: Open Heart Surgery;  Laterality: N/A;  AXILLARY CANNULATION WITH GRAFT   BUBBLE STUDY  09/28/2020   Procedure: BUBBLE STUDY;  Surgeon: Loni Soyla LABOR, MD;  Location: [facility] ENDOSCOPY;  Service: Cardiology;;   CHOLECYSTECTOMY     ENDOVEIN HARVEST OF GREATER SAPHENOUS VEIN Right 10/04/2020   Procedure: ENDOVEIN HARVEST OF GREATER SAPHENOUS VEIN;  Surgeon: Lucas Dorise POUR, MD;  Location: [facility] OR;  Service: Open Heart Surgery;  Laterality: Right;   Heart valve replaced revision  2022   RIGHT HEART CATH AND CORONARY ANGIOGRAPHY N/A 07/07/2018   Procedure: RIGHT HEART CATH AND CORONARY ANGIOGRAPHY;  Surgeon: Burnard Debby LABOR, MD;  Location: [facility] INVASIVE CV LAB;  Service: Cardiovascular;  Laterality: N/A;   TEE WITHOUT CARDIOVERSION N/A 09/10/2018   Procedure: TRANSESOPHAGEAL ECHOCARDIOGRAM (TEE);  Surgeon: Lucas Dorise POUR, MD;  Location: [facility] OR;  Service: Open Heart Surgery;  Laterality: N/A;   TEE WITHOUT CARDIOVERSION N/A 09/28/2020   Procedure: TRANSESOPHAGEAL ECHOCARDIOGRAM (TEE);  Surgeon: Loni Soyla LABOR, MD;  Location: [facility] ENDOSCOPY;  Service: Cardiology;  Laterality: N/A;   TEE WITHOUT CARDIOVERSION N/A 10/04/2020   Procedure: TRANSESOPHAGEAL ECHOCARDIOGRAM (TEE);  Surgeon: Lucas Dorise POUR, MD;  Location: [facility] OR;  Service: Open Heart Surgery;  Laterality: N/A;    Family History  Problem Relation Age of Onset   Lung cancer Mother    Pulmonary disease Mother    Alcoholism Father    Anxiety disorder Father    Hyperlipidemia Father    Lung cancer Father    CAD Father    Renal cancer Father    Pulmonary disease  Father    Social History   Socioeconomic History   Marital status: Married    Spouse name: Not on file   Number of children: Not on file   Years of education: Not on file   Highest education level: Not on file  Occupational History   Not on file  Tobacco Use   Smoking status: Never   Smokeless tobacco: Former    Types: Snuff  Vaping Use   Vaping status: Never Used  Substance and Sexual Activity   Alcohol use: Not Currently    Alcohol/week: 2.0 standard drinks of alcohol    Types: 2 Cans of beer per week    Comment: daily   Drug use: Never   Sexual activity: Not Currently  Other Topics Concern   Not on file  Social History Narrative   Not on file   Social Drivers of Health   Financial Resource Strain: Low  Risk  (01/23/2023)   Overall Financial Resource Strain (CARDIA)    Difficulty of Paying Living Expenses: Not hard at all  Food Insecurity: No Food Insecurity (01/23/2023)   Hunger Vital Sign    Worried About Running Out of Food in the Last Year: Never true    Ran Out of Food in the Last Year: Never true  Transportation Needs: No Transportation Needs (01/23/2023)   PRAPARE - Administrator, Civil Service (Medical): No    Lack of Transportation (Non-Medical): No  Physical Activity: Insufficiently Active (01/23/2023)   Exercise Vital Sign    Days of Exercise per Week: 5 days    Minutes of Exercise per Session: 20 min  Stress: No Stress Concern Present (01/23/2023)   [employer] of Occupational Health - Occupational Stress Questionnaire    Feeling of Stress : Not at all  Social Connections: Moderately Isolated (01/23/2023)   Social Connection and Isolation Panel    Frequency of Communication with Friends and Family: More than three times a week    Frequency of Social Gatherings with Friends and Family: Three times a week    Attends Religious Services: Never    Active Member of Clubs or Organizations: No    Attends Banker Meetings: Never     Marital Status: Living with partner   CONSTITUTIONAL: Negative for chills, fatigue, fever, unintentional weight gain and unintentional weight loss.  E/N/T: Negative for ear pain, nasal congestion and sore throat.  CARDIOVASCULAR: Negative for chest pain, dizziness, palpitations and pedal edema.  RESPIRATORY: Negative for recent cough and dyspnea.  GASTROINTESTINAL: Negative for abdominal pain, acid reflux symptoms, constipation, diarrhea, nausea and vomiting.  MSK:see HPI INTEGUMENTARY: Negative for rash.  NEUROLOGICAL: Negative for dizziness and headaches.  PSYCHIATRIC: Negative for sleep disturbance and to question depression screen.  Negative for depression, negative for anhedonia.       Objective:  PHYSICAL EXAM:   VS: BP 132/62   Pulse 60   Temp 97.8 F (36.6 C) (Temporal)   Resp 18   Ht 5' 8 (1.727 m)   Wt 209 lb 6.4 oz (95 kg)   BMI 31.84 kg/m   GEN: Well nourished, well developed, in no acute distress  Cardiac: RRR; grade 2/6 systolic murmur noted, no rubs, or gallops,no edema - Respiratory:  normal respiratory rate and pattern with no distress - normal breath sounds with no rales, rhonchi, wheezes or rubs  MS: no deformity or atrophy  Skin: warm and dry, no rash  Neuro:  Alert and Oriented x 3,  - CN II-Xii grossly intact Psych: euthymic mood, appropriate affect and demeanor    Lab Results  Component Value Date   WBC 6.1 07/24/2023   HGB 12.8 (L) 07/24/2023   HCT 37.0 (L) 07/24/2023   PLT 174 07/24/2023   GLUCOSE 117 (H) 07/24/2023   CHOL 136 07/24/2023   TRIG 159 (H) 07/24/2023   HDL 43 07/24/2023   LDLCALC 66 07/24/2023   ALT 28 07/24/2023   AST 31 07/24/2023   NA 139 07/24/2023   K 4.7 07/24/2023   CL 100 07/24/2023   CREATININE 0.90 12/26/2023   BUN 10 07/24/2023   CO2 24 07/24/2023   TSH 2.190 07/24/2023   INR 1.4 (H) 10/04/2020   HGBA1C 6.6 (H) 07/24/2023      Assessment & Plan:   Problem List Items Addressed This Visit        Cardiovascular and Mediastinum   Essential hypertension - Primary- uncontrolled  Relevant Orders   Comprehensive metabolic panel   Labwork pending Continue current meds     Other   Hyperlipidemia   Relevant Orders   Lipid panel Continue meds Watch diet   Other Visit Diagnoses     Type 2 diabetes mellitus with hyperglycemia, without long-term current use of insulin  (HCC)       Relevant Orders   Comprehensive metabolic panel   Hemoglobin A1c   Lipid panel Continue meds      .GERD Continue nexium  Cervical radiculopathy Exercises/rom Rx for gabapentin   S/p aortic valve replacement and aortoplasty Continue follow up with cardiology  Need flu  vaccine Flublok given  No orders of the defined types were placed in this encounter.    Orders Placed This Encounter  Procedures   Flu vaccine, recombinant, trivalent, inj   CBC with Differential/Platelet   Comprehensive metabolic panel with GFR   Lipid panel   Hemoglobin A1c   PSA   TSH     Follow-up: Return in about 6 months (around 07/23/2024) for chronic fasting follow-up.  An After Visit Summary was printed and given to the patient.  CAMIE JONELLE NICHOLAUS DEVONNA [facility] [phone]

## 2024-01-24 LAB — COMPREHENSIVE METABOLIC PANEL WITH GFR
ALT: 37 IU/L (ref 0–44)
AST: 51 IU/L — ABNORMAL HIGH (ref 0–40)
Albumin: 4.7 g/dL (ref 3.8–4.9)
Alkaline Phosphatase: 63 IU/L (ref 47–123)
BUN/Creatinine Ratio: 10 (ref 9–20)
BUN: 10 mg/dL (ref 6–24)
Bilirubin Total: 0.8 mg/dL (ref 0.0–1.2)
CO2: 23 mmol/L (ref 20–29)
Calcium: 10.8 mg/dL — ABNORMAL HIGH (ref 8.7–10.2)
Chloride: 99 mmol/L (ref 96–106)
Creatinine, Ser: 0.99 mg/dL (ref 0.76–1.27)
Globulin, Total: 2.3 g/dL (ref 1.5–4.5)
Glucose: 97 mg/dL (ref 70–99)
Potassium: 4.7 mmol/L (ref 3.5–5.2)
Sodium: 137 mmol/L (ref 134–144)
Total Protein: 7 g/dL (ref 6.0–8.5)
eGFR: 88 mL/min/1.73 (ref >59)

## 2024-01-24 LAB — LIPID PANEL
Chol/HDL Ratio: 3.4 ratio (ref 0.0–5.0)
Cholesterol, Total: 132 mg/dL (ref 100–199)
HDL: 39 mg/dL — ABNORMAL LOW (ref >39)
LDL Chol Calc (NIH): 67 mg/dL (ref 0–99)
Triglycerides: 148 mg/dL (ref 0–149)
VLDL Cholesterol Cal: 26 mg/dL (ref 5–40)

## 2024-01-24 LAB — CBC WITH DIFFERENTIAL/PLATELET
Basophils Absolute: 0.1 x10E3/uL (ref 0.0–0.2)
Basos: 2 %
EOS (ABSOLUTE): 0.3 x10E3/uL (ref 0.0–0.4)
Eos: 5 %
Hematocrit: 37.8 % (ref 37.5–51.0)
Hemoglobin: 12.7 g/dL — ABNORMAL LOW (ref 13.0–17.7)
Immature Grans (Abs): 0 x10E3/uL (ref 0.0–0.1)
Immature Granulocytes: 0 %
Lymphocytes Absolute: 1.5 x10E3/uL (ref 0.7–3.1)
Lymphs: 29 %
MCH: 33.2 pg — ABNORMAL HIGH (ref 26.6–33.0)
MCHC: 33.6 g/dL (ref 31.5–35.7)
MCV: 99 fL — ABNORMAL HIGH (ref 79–97)
Monocytes Absolute: 0.5 x10E3/uL (ref 0.1–0.9)
Monocytes: 10 %
Neutrophils Absolute: 2.7 x10E3/uL (ref 1.4–7.0)
Neutrophils: 54 %
Platelets: 206 x10E3/uL (ref 150–450)
RBC: 3.83 x10E6/uL — ABNORMAL LOW (ref 4.14–5.80)
RDW: 12.2 % (ref 11.6–15.4)
WBC: 5 x10E3/uL (ref 3.4–10.8)

## 2024-01-24 LAB — HEMOGLOBIN A1C
Est. average glucose Bld gHb Est-mCnc: 128 mg/dL
Hgb A1c MFr Bld: 6.1 % — ABNORMAL HIGH (ref 4.8–5.6)

## 2024-01-24 LAB — PSA: Prostate Specific Ag, Serum: 1.5 ng/mL (ref 0.0–4.0)

## 2024-01-24 LAB — TSH: TSH: 1.52 u[IU]/mL (ref 0.450–4.500)

## 2024-01-26 ENCOUNTER — Other Ambulatory Visit: Payer: Self-pay | Admitting: Physician Assistant

## 2024-01-26 ENCOUNTER — Ambulatory Visit: Payer: Self-pay | Admitting: Physician Assistant

## 2024-01-26 DIAGNOSIS — R899 Unspecified abnormal finding in specimens from other organs, systems and tissues: Secondary | ICD-10-CM

## 2024-02-02 ENCOUNTER — Other Ambulatory Visit

## 2024-02-02 DIAGNOSIS — R899 Unspecified abnormal finding in specimens from other organs, systems and tissues: Secondary | ICD-10-CM

## 2024-02-03 ENCOUNTER — Ambulatory Visit: Payer: Self-pay | Admitting: Physician Assistant

## 2024-02-03 LAB — BASIC METABOLIC PANEL WITH GFR
BUN/Creatinine Ratio: 11 (ref 9–20)
BUN: 10 mg/dL (ref 6–24)
CO2: 22 mmol/L (ref 20–29)
Calcium: 9.4 mg/dL (ref 8.7–10.2)
Chloride: 105 mmol/L (ref 96–106)
Creatinine, Ser: 0.87 mg/dL (ref 0.76–1.27)
Glucose: 160 mg/dL — ABNORMAL HIGH (ref 70–99)
Potassium: 4 mmol/L (ref 3.5–5.2)
Sodium: 143 mmol/L (ref 134–144)
eGFR: 99 mL/min/1.73 (ref >59)

## 2024-02-03 LAB — B12 AND FOLATE PANEL
Folate: >20 ng/mL (ref >3.0)
Vitamin B-12: 347 pg/mL (ref 232–1245)

## 2024-03-22 ENCOUNTER — Other Ambulatory Visit: Payer: Self-pay

## 2024-03-22 MED ORDER — LOSARTAN POTASSIUM 100 MG PO TABS: 100.0000 mg | ORAL_TABLET | Freq: Every day | ORAL | 1 refills | Status: AC

## 2024-04-18 ENCOUNTER — Other Ambulatory Visit: Payer: Self-pay | Admitting: Physician Assistant

## 2024-04-18 DIAGNOSIS — E1165 Type 2 diabetes mellitus with hyperglycemia: Secondary | ICD-10-CM

## 2024-05-10 ENCOUNTER — Other Ambulatory Visit: Payer: Self-pay | Admitting: Physician Assistant

## 2024-05-10 DIAGNOSIS — M79601 Pain in right arm: Secondary | ICD-10-CM

## 2024-07-20 ENCOUNTER — Ambulatory Visit: Admitting: Cardiology

## 2024-07-30 ENCOUNTER — Ambulatory Visit: Admitting: Physician Assistant
